# Patient Record
Sex: Male | Born: 1940 | Race: White | Hispanic: No | Smoking: Former smoker
Health system: Southern US, Community
[De-identification: ages and names within clinical notes are randomized; demographics above are authoritative.]

## PROBLEM LIST (undated history)

## (undated) DIAGNOSIS — D126 Benign neoplasm of colon, unspecified: Secondary | ICD-10-CM

## (undated) DIAGNOSIS — M545 Low back pain, unspecified: Secondary | ICD-10-CM

## (undated) DIAGNOSIS — Z87442 Personal history of urinary calculi: Secondary | ICD-10-CM

## (undated) DIAGNOSIS — I4891 Unspecified atrial fibrillation: Secondary | ICD-10-CM

## (undated) DIAGNOSIS — K5730 Diverticulosis of large intestine without perforation or abscess without bleeding: Secondary | ICD-10-CM

## (undated) DIAGNOSIS — Z720 Tobacco use: Secondary | ICD-10-CM

## (undated) DIAGNOSIS — G8929 Other chronic pain: Secondary | ICD-10-CM

## (undated) DIAGNOSIS — N138 Other obstructive and reflux uropathy: Secondary | ICD-10-CM

## (undated) DIAGNOSIS — I10 Essential (primary) hypertension: Secondary | ICD-10-CM

## (undated) DIAGNOSIS — I639 Cerebral infarction, unspecified: Secondary | ICD-10-CM

## (undated) DIAGNOSIS — I509 Heart failure, unspecified: Secondary | ICD-10-CM

## (undated) DIAGNOSIS — Z8719 Personal history of other diseases of the digestive system: Secondary | ICD-10-CM

## (undated) DIAGNOSIS — N410 Acute prostatitis: Secondary | ICD-10-CM

## (undated) DIAGNOSIS — K611 Rectal abscess: Secondary | ICD-10-CM

## (undated) DIAGNOSIS — R002 Palpitations: Secondary | ICD-10-CM

## (undated) DIAGNOSIS — R6882 Decreased libido: Secondary | ICD-10-CM

## (undated) DIAGNOSIS — N401 Enlarged prostate with lower urinary tract symptoms: Secondary | ICD-10-CM

## (undated) DIAGNOSIS — E785 Hyperlipidemia, unspecified: Secondary | ICD-10-CM

## (undated) DIAGNOSIS — J449 Chronic obstructive pulmonary disease, unspecified: Secondary | ICD-10-CM

## (undated) DIAGNOSIS — I2510 Atherosclerotic heart disease of native coronary artery without angina pectoris: Secondary | ICD-10-CM

## (undated) DIAGNOSIS — M1990 Unspecified osteoarthritis, unspecified site: Secondary | ICD-10-CM

## (undated) DIAGNOSIS — K219 Gastro-esophageal reflux disease without esophagitis: Secondary | ICD-10-CM

## (undated) DIAGNOSIS — C801 Malignant (primary) neoplasm, unspecified: Secondary | ICD-10-CM

## (undated) DIAGNOSIS — K222 Esophageal obstruction: Secondary | ICD-10-CM

## (undated) DIAGNOSIS — R5383 Other fatigue: Secondary | ICD-10-CM

## (undated) HISTORY — DX: Unspecified atrial fibrillation: I48.91

## (undated) HISTORY — PX: LUMBAR LAMINECTOMY: SHX95

## (undated) HISTORY — DX: Low back pain, unspecified: M54.50

## (undated) HISTORY — DX: Diverticulosis of large intestine without perforation or abscess without bleeding: K57.30

## (undated) HISTORY — DX: Low back pain: M54.5

## (undated) HISTORY — DX: Esophageal obstruction: K22.2

## (undated) HISTORY — DX: Personal history of other diseases of the digestive system: Z87.19

## (undated) HISTORY — DX: Rectal abscess: K61.1

## (undated) HISTORY — PX: HERNIA REPAIR: SHX51

## (undated) HISTORY — DX: Hyperlipidemia, unspecified: E78.5

## (undated) HISTORY — DX: Benign prostatic hyperplasia with lower urinary tract symptoms: N13.8

## (undated) HISTORY — DX: Chronic obstructive pulmonary disease, unspecified: J44.9

## (undated) HISTORY — DX: Other chronic pain: G89.29

## (undated) HISTORY — DX: Malignant (primary) neoplasm, unspecified: C80.1

## (undated) HISTORY — PX: PENILE PROSTHESIS PLACEMENT: SHX739

## (undated) HISTORY — DX: Other fatigue: R53.83

## (undated) HISTORY — DX: Palpitations: R00.2

## (undated) HISTORY — DX: Unspecified osteoarthritis, unspecified site: M19.90

## (undated) HISTORY — DX: Acute prostatitis: N41.0

## (undated) HISTORY — DX: Benign neoplasm of colon, unspecified: D12.6

## (undated) HISTORY — PX: TONSILLECTOMY: SHX5217

## (undated) HISTORY — DX: Atherosclerotic heart disease of native coronary artery without angina pectoris: I25.10

## (undated) HISTORY — DX: Tobacco use: Z72.0

## (undated) HISTORY — DX: Personal history of urinary calculi: Z87.442

## (undated) HISTORY — DX: Essential (primary) hypertension: I10

## (undated) HISTORY — DX: Benign prostatic hyperplasia with lower urinary tract symptoms: N40.1

## (undated) HISTORY — DX: Decreased libido: R68.82

## (undated) HISTORY — DX: Gastro-esophageal reflux disease without esophagitis: K21.9

---

## 1998-02-10 ENCOUNTER — Ambulatory Visit (HOSPITAL_COMMUNITY): Admission: RE | Admit: 1998-02-10 | Discharge: 1998-02-10 | Payer: Self-pay | Admitting: Gastroenterology

## 1998-03-15 ENCOUNTER — Emergency Department (HOSPITAL_COMMUNITY): Admission: EM | Admit: 1998-03-15 | Discharge: 1998-03-15 | Payer: Self-pay | Admitting: Emergency Medicine

## 1998-05-30 ENCOUNTER — Inpatient Hospital Stay (HOSPITAL_COMMUNITY): Admission: EM | Admit: 1998-05-30 | Discharge: 1998-06-01 | Payer: Self-pay | Admitting: Emergency Medicine

## 1998-05-30 ENCOUNTER — Encounter: Payer: Self-pay | Admitting: Emergency Medicine

## 1998-05-31 ENCOUNTER — Encounter: Payer: Self-pay | Admitting: Internal Medicine

## 1998-08-27 ENCOUNTER — Emergency Department (HOSPITAL_COMMUNITY): Admission: EM | Admit: 1998-08-27 | Discharge: 1998-08-27 | Payer: Self-pay | Admitting: Emergency Medicine

## 1998-08-27 ENCOUNTER — Encounter: Payer: Self-pay | Admitting: *Deleted

## 2000-08-24 ENCOUNTER — Emergency Department (HOSPITAL_COMMUNITY): Admission: EM | Admit: 2000-08-24 | Discharge: 2000-08-24 | Payer: Self-pay

## 2001-03-01 ENCOUNTER — Emergency Department (HOSPITAL_COMMUNITY): Admission: EM | Admit: 2001-03-01 | Discharge: 2001-03-01 | Payer: Self-pay | Admitting: Emergency Medicine

## 2001-03-02 ENCOUNTER — Emergency Department (HOSPITAL_COMMUNITY): Admission: EM | Admit: 2001-03-02 | Discharge: 2001-03-03 | Payer: Self-pay | Admitting: Emergency Medicine

## 2001-09-21 ENCOUNTER — Encounter: Payer: Self-pay | Admitting: Emergency Medicine

## 2001-09-21 ENCOUNTER — Emergency Department (HOSPITAL_COMMUNITY): Admission: EM | Admit: 2001-09-21 | Discharge: 2001-09-21 | Payer: Self-pay | Admitting: Emergency Medicine

## 2001-11-28 ENCOUNTER — Encounter: Payer: Self-pay | Admitting: Emergency Medicine

## 2001-11-28 ENCOUNTER — Emergency Department (HOSPITAL_COMMUNITY): Admission: EM | Admit: 2001-11-28 | Discharge: 2001-11-28 | Payer: Self-pay | Admitting: Emergency Medicine

## 2002-01-02 ENCOUNTER — Emergency Department (HOSPITAL_COMMUNITY): Admission: EM | Admit: 2002-01-02 | Discharge: 2002-01-02 | Payer: Self-pay | Admitting: Emergency Medicine

## 2002-01-04 ENCOUNTER — Ambulatory Visit (HOSPITAL_COMMUNITY): Admission: AD | Admit: 2002-01-04 | Discharge: 2002-01-04 | Payer: Self-pay

## 2002-10-05 ENCOUNTER — Emergency Department (HOSPITAL_COMMUNITY): Admission: EM | Admit: 2002-10-05 | Discharge: 2002-10-05 | Payer: Self-pay | Admitting: Emergency Medicine

## 2002-10-05 ENCOUNTER — Encounter: Payer: Self-pay | Admitting: Emergency Medicine

## 2003-06-11 ENCOUNTER — Ambulatory Visit (HOSPITAL_COMMUNITY): Admission: RE | Admit: 2003-06-11 | Discharge: 2003-06-11 | Payer: Self-pay | Admitting: *Deleted

## 2003-07-09 ENCOUNTER — Ambulatory Visit (HOSPITAL_COMMUNITY): Admission: RE | Admit: 2003-07-09 | Discharge: 2003-07-09 | Payer: Self-pay | Admitting: *Deleted

## 2003-08-23 ENCOUNTER — Emergency Department (HOSPITAL_COMMUNITY): Admission: EM | Admit: 2003-08-23 | Discharge: 2003-08-24 | Payer: Self-pay | Admitting: Emergency Medicine

## 2003-11-09 ENCOUNTER — Emergency Department (HOSPITAL_COMMUNITY): Admission: AD | Admit: 2003-11-09 | Discharge: 2003-11-09 | Payer: Self-pay | Admitting: *Deleted

## 2004-04-12 ENCOUNTER — Encounter: Admission: RE | Admit: 2004-04-12 | Discharge: 2004-04-12 | Payer: Self-pay | Admitting: Neurology

## 2004-10-28 ENCOUNTER — Emergency Department (HOSPITAL_COMMUNITY): Admission: EM | Admit: 2004-10-28 | Discharge: 2004-10-28 | Payer: Self-pay | Admitting: Emergency Medicine

## 2004-10-29 ENCOUNTER — Ambulatory Visit (HOSPITAL_COMMUNITY): Admission: RE | Admit: 2004-10-29 | Discharge: 2004-10-29 | Payer: Self-pay | Admitting: Emergency Medicine

## 2005-10-07 ENCOUNTER — Encounter (INDEPENDENT_AMBULATORY_CARE_PROVIDER_SITE_OTHER): Payer: Self-pay | Admitting: *Deleted

## 2005-10-07 ENCOUNTER — Ambulatory Visit (HOSPITAL_COMMUNITY): Admission: RE | Admit: 2005-10-07 | Discharge: 2005-10-07 | Payer: Self-pay | Admitting: *Deleted

## 2005-10-08 ENCOUNTER — Ambulatory Visit: Payer: Self-pay | Admitting: Cardiovascular Disease

## 2005-10-08 ENCOUNTER — Inpatient Hospital Stay (HOSPITAL_COMMUNITY): Admission: EM | Admit: 2005-10-08 | Discharge: 2005-10-09 | Payer: Self-pay | Admitting: Emergency Medicine

## 2005-10-20 ENCOUNTER — Ambulatory Visit: Payer: Self-pay

## 2005-10-20 ENCOUNTER — Encounter: Payer: Self-pay | Admitting: Internal Medicine

## 2005-10-24 ENCOUNTER — Ambulatory Visit: Payer: Self-pay | Admitting: Cardiovascular Disease

## 2005-10-26 ENCOUNTER — Emergency Department (HOSPITAL_COMMUNITY): Admission: EM | Admit: 2005-10-26 | Discharge: 2005-10-26 | Payer: Self-pay | Admitting: Emergency Medicine

## 2005-12-26 ENCOUNTER — Ambulatory Visit (HOSPITAL_COMMUNITY): Admission: RE | Admit: 2005-12-26 | Discharge: 2005-12-26 | Payer: Self-pay | Admitting: *Deleted

## 2006-01-04 ENCOUNTER — Encounter: Payer: Self-pay | Admitting: Internal Medicine

## 2006-02-15 ENCOUNTER — Encounter: Admission: RE | Admit: 2006-02-15 | Discharge: 2006-02-15 | Payer: Self-pay | Admitting: *Deleted

## 2006-03-11 ENCOUNTER — Emergency Department (HOSPITAL_COMMUNITY): Admission: EM | Admit: 2006-03-11 | Discharge: 2006-03-11 | Payer: Self-pay | Admitting: Emergency Medicine

## 2006-03-27 ENCOUNTER — Emergency Department (HOSPITAL_COMMUNITY): Admission: EM | Admit: 2006-03-27 | Discharge: 2006-03-27 | Payer: Self-pay | Admitting: Emergency Medicine

## 2006-04-07 ENCOUNTER — Ambulatory Visit: Payer: Self-pay | Admitting: Cardiovascular Disease

## 2006-04-27 ENCOUNTER — Ambulatory Visit: Payer: Self-pay | Admitting: Internal Medicine

## 2006-06-01 ENCOUNTER — Ambulatory Visit: Payer: Self-pay | Admitting: Internal Medicine

## 2006-06-27 ENCOUNTER — Ambulatory Visit: Payer: Self-pay | Admitting: Pulmonary Disease

## 2006-07-21 ENCOUNTER — Ambulatory Visit: Payer: Self-pay | Admitting: Internal Medicine

## 2006-10-09 ENCOUNTER — Emergency Department (HOSPITAL_COMMUNITY): Admission: EM | Admit: 2006-10-09 | Discharge: 2006-10-09 | Payer: Self-pay | Admitting: Emergency Medicine

## 2006-10-11 ENCOUNTER — Ambulatory Visit: Payer: Self-pay | Admitting: Cardiovascular Disease

## 2006-11-03 ENCOUNTER — Ambulatory Visit: Payer: Self-pay | Admitting: Internal Medicine

## 2006-11-03 LAB — CONVERTED CEMR LAB: PSA: 1.08 ng/mL (ref 0.10–4.00)

## 2006-11-17 ENCOUNTER — Ambulatory Visit: Payer: Self-pay | Admitting: Internal Medicine

## 2006-12-12 ENCOUNTER — Ambulatory Visit: Payer: Self-pay | Admitting: Gastroenterology

## 2006-12-25 ENCOUNTER — Encounter (INDEPENDENT_AMBULATORY_CARE_PROVIDER_SITE_OTHER): Payer: Self-pay | Admitting: Specialist

## 2006-12-25 ENCOUNTER — Ambulatory Visit: Payer: Self-pay | Admitting: Gastroenterology

## 2007-03-13 ENCOUNTER — Ambulatory Visit: Payer: Self-pay | Admitting: Internal Medicine

## 2007-05-08 ENCOUNTER — Encounter: Payer: Self-pay | Admitting: *Deleted

## 2007-05-08 DIAGNOSIS — F17200 Nicotine dependence, unspecified, uncomplicated: Secondary | ICD-10-CM

## 2007-05-08 DIAGNOSIS — Z872 Personal history of diseases of the skin and subcutaneous tissue: Secondary | ICD-10-CM | POA: Insufficient documentation

## 2007-05-08 DIAGNOSIS — Z8739 Personal history of other diseases of the musculoskeletal system and connective tissue: Secondary | ICD-10-CM

## 2007-05-08 DIAGNOSIS — I10 Essential (primary) hypertension: Secondary | ICD-10-CM | POA: Insufficient documentation

## 2007-05-08 DIAGNOSIS — I2510 Atherosclerotic heart disease of native coronary artery without angina pectoris: Secondary | ICD-10-CM

## 2007-05-08 DIAGNOSIS — Z9689 Presence of other specified functional implants: Secondary | ICD-10-CM

## 2007-05-08 DIAGNOSIS — Z9889 Other specified postprocedural states: Secondary | ICD-10-CM

## 2007-05-08 DIAGNOSIS — J45909 Unspecified asthma, uncomplicated: Secondary | ICD-10-CM | POA: Insufficient documentation

## 2007-05-08 DIAGNOSIS — K219 Gastro-esophageal reflux disease without esophagitis: Secondary | ICD-10-CM | POA: Insufficient documentation

## 2007-05-08 DIAGNOSIS — Z87442 Personal history of urinary calculi: Secondary | ICD-10-CM

## 2007-05-14 ENCOUNTER — Ambulatory Visit: Payer: Self-pay | Admitting: Gastroenterology

## 2007-05-16 ENCOUNTER — Ambulatory Visit (HOSPITAL_COMMUNITY): Admission: RE | Admit: 2007-05-16 | Discharge: 2007-05-16 | Payer: Self-pay | Admitting: Gastroenterology

## 2007-05-22 ENCOUNTER — Encounter: Payer: Self-pay | Admitting: Gastroenterology

## 2007-05-22 ENCOUNTER — Ambulatory Visit: Payer: Self-pay | Admitting: Gastroenterology

## 2007-05-22 DIAGNOSIS — D126 Benign neoplasm of colon, unspecified: Secondary | ICD-10-CM

## 2007-05-22 DIAGNOSIS — K222 Esophageal obstruction: Secondary | ICD-10-CM | POA: Insufficient documentation

## 2007-05-22 DIAGNOSIS — K5730 Diverticulosis of large intestine without perforation or abscess without bleeding: Secondary | ICD-10-CM | POA: Insufficient documentation

## 2007-05-22 LAB — HM COLONOSCOPY: HM Colonoscopy: ABNORMAL

## 2007-08-20 ENCOUNTER — Ambulatory Visit: Payer: Self-pay | Admitting: Internal Medicine

## 2007-08-20 ENCOUNTER — Encounter: Payer: Self-pay | Admitting: Internal Medicine

## 2007-08-20 ENCOUNTER — Inpatient Hospital Stay (HOSPITAL_COMMUNITY): Admission: EM | Admit: 2007-08-20 | Discharge: 2007-08-24 | Payer: Self-pay | Admitting: Emergency Medicine

## 2007-08-24 ENCOUNTER — Ambulatory Visit: Payer: Self-pay | Admitting: Gastroenterology

## 2007-09-25 ENCOUNTER — Ambulatory Visit: Payer: Self-pay | Admitting: Gastroenterology

## 2007-10-02 ENCOUNTER — Ambulatory Visit: Payer: Self-pay | Admitting: Internal Medicine

## 2007-10-02 DIAGNOSIS — J449 Chronic obstructive pulmonary disease, unspecified: Secondary | ICD-10-CM

## 2007-10-02 DIAGNOSIS — M545 Low back pain: Secondary | ICD-10-CM

## 2007-10-02 DIAGNOSIS — J4489 Other specified chronic obstructive pulmonary disease: Secondary | ICD-10-CM | POA: Insufficient documentation

## 2007-10-03 ENCOUNTER — Ambulatory Visit: Payer: Self-pay | Admitting: Cardiovascular Disease

## 2007-10-04 ENCOUNTER — Encounter: Payer: Self-pay | Admitting: Internal Medicine

## 2007-10-15 ENCOUNTER — Encounter: Admission: RE | Admit: 2007-10-15 | Discharge: 2007-10-15 | Payer: Self-pay | Admitting: Orthopedic Surgery

## 2007-10-24 ENCOUNTER — Ambulatory Visit: Payer: Self-pay

## 2007-10-24 ENCOUNTER — Encounter: Payer: Self-pay | Admitting: Internal Medicine

## 2007-11-07 ENCOUNTER — Ambulatory Visit: Payer: Self-pay | Admitting: Internal Medicine

## 2007-11-09 LAB — CONVERTED CEMR LAB
CO2: 33 meq/L — ABNORMAL HIGH (ref 19–32)
HDL: 28.1 mg/dL — ABNORMAL LOW (ref >39.0)
PSA: 1.06 ng/mL (ref 0.10–4.00)
TSH: 2.39 microintl units/mL (ref 0.35–5.50)
Total CHOL/HDL Ratio: 8.7
Triglycerides: 151 mg/dL — ABNORMAL HIGH (ref 0–149)

## 2007-11-13 ENCOUNTER — Ambulatory Visit: Payer: Self-pay | Admitting: Internal Medicine

## 2007-11-13 DIAGNOSIS — E785 Hyperlipidemia, unspecified: Secondary | ICD-10-CM

## 2007-11-28 DIAGNOSIS — Z8719 Personal history of other diseases of the digestive system: Secondary | ICD-10-CM

## 2007-12-07 ENCOUNTER — Ambulatory Visit: Payer: Self-pay | Admitting: Internal Medicine

## 2008-01-11 ENCOUNTER — Ambulatory Visit: Payer: Self-pay | Admitting: Internal Medicine

## 2008-01-11 LAB — CONVERTED CEMR LAB
ALT: 15 units/L (ref 0–53)
AST: 16 units/L (ref 0–37)
Direct LDL: 164.9 mg/dL
HDL: 28.1 mg/dL — ABNORMAL LOW (ref >39.0)
Total CHOL/HDL Ratio: 7.8
VLDL: 26 mg/dL (ref 0–40)

## 2008-01-15 ENCOUNTER — Ambulatory Visit: Payer: Self-pay | Admitting: Internal Medicine

## 2008-01-16 ENCOUNTER — Telehealth: Payer: Self-pay | Admitting: Internal Medicine

## 2008-04-01 ENCOUNTER — Ambulatory Visit: Payer: Self-pay | Admitting: Internal Medicine

## 2008-06-18 ENCOUNTER — Emergency Department (HOSPITAL_COMMUNITY): Admission: EM | Admit: 2008-06-18 | Discharge: 2008-06-18 | Payer: Self-pay | Admitting: Emergency Medicine

## 2008-09-11 ENCOUNTER — Ambulatory Visit: Payer: Self-pay | Admitting: Internal Medicine

## 2008-09-12 ENCOUNTER — Ambulatory Visit: Payer: Self-pay | Admitting: Internal Medicine

## 2008-09-16 ENCOUNTER — Ambulatory Visit: Payer: Self-pay | Admitting: Internal Medicine

## 2008-09-16 ENCOUNTER — Inpatient Hospital Stay (HOSPITAL_COMMUNITY): Admission: EM | Admit: 2008-09-16 | Discharge: 2008-09-19 | Payer: Self-pay | Admitting: Emergency Medicine

## 2008-09-17 ENCOUNTER — Encounter: Payer: Self-pay | Admitting: Internal Medicine

## 2008-10-24 ENCOUNTER — Telehealth: Payer: Self-pay | Admitting: Internal Medicine

## 2009-03-25 ENCOUNTER — Ambulatory Visit: Payer: Self-pay | Admitting: Internal Medicine

## 2009-03-25 DIAGNOSIS — N410 Acute prostatitis: Secondary | ICD-10-CM

## 2009-03-25 DIAGNOSIS — N401 Enlarged prostate with lower urinary tract symptoms: Secondary | ICD-10-CM

## 2009-03-25 LAB — CONVERTED CEMR LAB
Albumin: 4 g/dL (ref 3.5–5.2)
Alkaline Phosphatase: 81 units/L (ref 39–117)
BUN: 12 mg/dL (ref 6–23)
Basophils Absolute: 0.3 10*3/uL — ABNORMAL HIGH (ref 0.0–0.1)
Bilirubin Urine: NEGATIVE
CO2: 32 meq/L (ref 19–32)
Calcium: 9.2 mg/dL (ref 8.4–10.5)
Eosinophils Absolute: 0.1 10*3/uL (ref 0.0–0.7)
Glucose, Bld: 101 mg/dL — ABNORMAL HIGH (ref 70–99)
Hemoglobin: 15.9 g/dL (ref 13.0–17.0)
Lymphocytes Relative: 22.5 % (ref 12.0–46.0)
Lymphs Abs: 1.6 10*3/uL (ref 0.7–4.0)
MCHC: 34.1 g/dL (ref 30.0–36.0)
Neutro Abs: 4.5 10*3/uL (ref 1.4–7.7)
Nitrite: NEGATIVE
Platelets: 176 10*3/uL (ref 150.0–400.0)
RDW: 14.9 % — ABNORMAL HIGH (ref 11.5–14.6)
Sodium: 144 meq/L (ref 135–145)
TSH: 2.06 microintl units/mL (ref 0.35–5.50)
Total Bilirubin: 1 mg/dL (ref 0.3–1.2)
Urobilinogen, UA: 0.2

## 2009-03-26 ENCOUNTER — Encounter: Payer: Self-pay | Admitting: Internal Medicine

## 2009-03-26 LAB — CONVERTED CEMR LAB
Chlamydia, Swab/Urine, PCR: NEGATIVE
GC Probe Amp, Urine: NEGATIVE

## 2009-03-31 ENCOUNTER — Ambulatory Visit: Payer: Self-pay | Admitting: Internal Medicine

## 2009-03-31 ENCOUNTER — Ambulatory Visit (HOSPITAL_BASED_OUTPATIENT_CLINIC_OR_DEPARTMENT_OTHER): Admission: RE | Admit: 2009-03-31 | Discharge: 2009-03-31 | Payer: Self-pay | Admitting: Internal Medicine

## 2009-03-31 ENCOUNTER — Ambulatory Visit: Payer: Self-pay | Admitting: Diagnostic Radiology

## 2009-03-31 DIAGNOSIS — R6882 Decreased libido: Secondary | ICD-10-CM | POA: Insufficient documentation

## 2009-03-31 DIAGNOSIS — R5383 Other fatigue: Secondary | ICD-10-CM

## 2009-03-31 DIAGNOSIS — R5381 Other malaise: Secondary | ICD-10-CM

## 2009-03-31 LAB — CONVERTED CEMR LAB
Blood in Urine, dipstick: NEGATIVE
Protein, U semiquant: 30
Urobilinogen, UA: 0.2
WBC Urine, dipstick: NEGATIVE

## 2009-04-08 DIAGNOSIS — R002 Palpitations: Secondary | ICD-10-CM | POA: Insufficient documentation

## 2009-04-08 DIAGNOSIS — I4891 Unspecified atrial fibrillation: Secondary | ICD-10-CM | POA: Insufficient documentation

## 2009-04-08 DIAGNOSIS — R109 Unspecified abdominal pain: Secondary | ICD-10-CM | POA: Insufficient documentation

## 2009-04-13 ENCOUNTER — Telehealth: Payer: Self-pay | Admitting: Internal Medicine

## 2009-04-17 ENCOUNTER — Encounter (INDEPENDENT_AMBULATORY_CARE_PROVIDER_SITE_OTHER): Payer: Self-pay | Admitting: *Deleted

## 2009-04-19 ENCOUNTER — Emergency Department (HOSPITAL_COMMUNITY): Admission: EM | Admit: 2009-04-19 | Discharge: 2009-04-19 | Payer: Self-pay | Admitting: Emergency Medicine

## 2009-04-21 ENCOUNTER — Ambulatory Visit: Payer: Self-pay | Admitting: Internal Medicine

## 2009-04-21 DIAGNOSIS — J209 Acute bronchitis, unspecified: Secondary | ICD-10-CM

## 2009-04-21 DIAGNOSIS — J040 Acute laryngitis: Secondary | ICD-10-CM | POA: Insufficient documentation

## 2009-04-24 ENCOUNTER — Encounter: Payer: Self-pay | Admitting: Internal Medicine

## 2009-04-27 ENCOUNTER — Telehealth: Payer: Self-pay | Admitting: Internal Medicine

## 2009-05-05 ENCOUNTER — Ambulatory Visit: Payer: Self-pay | Admitting: Internal Medicine

## 2009-05-05 DIAGNOSIS — N508 Other specified disorders of male genital organs: Secondary | ICD-10-CM

## 2009-05-08 ENCOUNTER — Encounter: Payer: Self-pay | Admitting: Internal Medicine

## 2009-06-01 ENCOUNTER — Ambulatory Visit: Payer: Self-pay | Admitting: Internal Medicine

## 2009-07-07 ENCOUNTER — Encounter: Payer: Self-pay | Admitting: Internal Medicine

## 2009-09-30 ENCOUNTER — Encounter: Payer: Self-pay | Admitting: Internal Medicine

## 2009-10-07 ENCOUNTER — Encounter: Payer: Self-pay | Admitting: Internal Medicine

## 2009-12-22 ENCOUNTER — Ambulatory Visit: Payer: Self-pay | Admitting: Internal Medicine

## 2009-12-29 ENCOUNTER — Ambulatory Visit: Payer: Self-pay | Admitting: Internal Medicine

## 2009-12-29 DIAGNOSIS — N4889 Other specified disorders of penis: Secondary | ICD-10-CM

## 2010-01-26 ENCOUNTER — Ambulatory Visit: Payer: Self-pay | Admitting: Internal Medicine

## 2010-01-26 DIAGNOSIS — M7020 Olecranon bursitis, unspecified elbow: Secondary | ICD-10-CM

## 2010-02-07 ENCOUNTER — Inpatient Hospital Stay (HOSPITAL_COMMUNITY): Admission: EM | Admit: 2010-02-07 | Discharge: 2010-02-09 | Payer: Self-pay | Admitting: Emergency Medicine

## 2010-02-11 ENCOUNTER — Ambulatory Visit: Payer: Self-pay | Admitting: Internal Medicine

## 2010-02-11 DIAGNOSIS — J441 Chronic obstructive pulmonary disease with (acute) exacerbation: Secondary | ICD-10-CM

## 2010-02-11 DIAGNOSIS — E119 Type 2 diabetes mellitus without complications: Secondary | ICD-10-CM | POA: Insufficient documentation

## 2010-02-11 LAB — CONVERTED CEMR LAB
BUN: 21 mg/dL (ref 6–23)
Calcium: 9.2 mg/dL (ref 8.4–10.5)
Creatinine, Ser: 0.93 mg/dL (ref 0.40–1.50)
Glucose, Bld: 132 mg/dL — ABNORMAL HIGH (ref 70–99)
Hgb A1c MFr Bld: 6.6 % — ABNORMAL HIGH (ref <5.7)

## 2010-02-12 ENCOUNTER — Telehealth: Payer: Self-pay | Admitting: Internal Medicine

## 2010-02-16 ENCOUNTER — Encounter: Payer: Self-pay | Admitting: Internal Medicine

## 2010-02-25 ENCOUNTER — Telehealth: Payer: Self-pay | Admitting: Internal Medicine

## 2010-03-15 ENCOUNTER — Telehealth: Payer: Self-pay | Admitting: Internal Medicine

## 2010-03-17 ENCOUNTER — Encounter: Payer: Self-pay | Admitting: Internal Medicine

## 2010-03-22 ENCOUNTER — Encounter: Admission: RE | Admit: 2010-03-22 | Discharge: 2010-06-03 | Payer: Self-pay | Admitting: Internal Medicine

## 2010-03-22 ENCOUNTER — Encounter: Payer: Self-pay | Admitting: Internal Medicine

## 2010-03-23 ENCOUNTER — Ambulatory Visit: Payer: Self-pay | Admitting: Internal Medicine

## 2010-03-25 ENCOUNTER — Encounter: Payer: Self-pay | Admitting: Internal Medicine

## 2010-03-26 ENCOUNTER — Encounter: Payer: Self-pay | Admitting: Internal Medicine

## 2010-04-02 ENCOUNTER — Telehealth: Payer: Self-pay | Admitting: Internal Medicine

## 2010-04-05 HISTORY — PX: CATARACT EXTRACTION: SUR2

## 2010-04-09 ENCOUNTER — Encounter: Payer: Self-pay | Admitting: Internal Medicine

## 2010-04-09 LAB — CONVERTED CEMR LAB
AST: 11 units/L (ref 0–37)
Albumin: 4.1 g/dL (ref 3.5–5.2)
Alkaline Phosphatase: 66 units/L (ref 39–117)
CO2: 31 meq/L (ref 19–32)
Calcium: 9.2 mg/dL (ref 8.4–10.5)
Cholesterol: 128 mg/dL (ref 0–200)
Creatinine, Ser: 0.96 mg/dL (ref 0.40–1.50)
HDL: 34 mg/dL — ABNORMAL LOW (ref >39)
Total Bilirubin: 0.6 mg/dL (ref 0.3–1.2)
Triglycerides: 100 mg/dL (ref <150)

## 2010-04-12 ENCOUNTER — Encounter: Payer: Self-pay | Admitting: Internal Medicine

## 2010-04-13 ENCOUNTER — Telehealth: Payer: Self-pay | Admitting: Internal Medicine

## 2010-04-13 ENCOUNTER — Encounter: Payer: Self-pay | Admitting: Internal Medicine

## 2010-04-14 ENCOUNTER — Telehealth: Payer: Self-pay | Admitting: Internal Medicine

## 2010-04-20 ENCOUNTER — Ambulatory Visit (HOSPITAL_COMMUNITY)
Admission: RE | Admit: 2010-04-20 | Discharge: 2010-04-21 | Payer: Self-pay | Source: Home / Self Care | Admitting: Ophthalmology

## 2010-05-17 ENCOUNTER — Ambulatory Visit: Payer: Self-pay | Admitting: Internal Medicine

## 2010-05-17 LAB — CONVERTED CEMR LAB
CO2: 24 meq/L (ref 19–32)
Calcium: 9.5 mg/dL (ref 8.4–10.5)
Chloride: 106 meq/L (ref 96–112)
Hgb A1c MFr Bld: 6.1 % — ABNORMAL HIGH (ref <5.7)
Microalb Creat Ratio: 8.7 mg/g (ref 0.0–30.0)

## 2010-05-18 ENCOUNTER — Encounter: Payer: Self-pay | Admitting: Internal Medicine

## 2010-07-27 ENCOUNTER — Encounter: Payer: Self-pay | Admitting: Internal Medicine

## 2010-09-13 ENCOUNTER — Ambulatory Visit
Admission: RE | Admit: 2010-09-13 | Discharge: 2010-09-13 | Payer: Self-pay | Source: Home / Self Care | Attending: Internal Medicine | Admitting: Internal Medicine

## 2010-09-13 DIAGNOSIS — R252 Cramp and spasm: Secondary | ICD-10-CM | POA: Insufficient documentation

## 2010-09-13 LAB — CONVERTED CEMR LAB
CO2: 31 meq/L (ref 19–32)
Chloride: 103 meq/L (ref 96–112)
Creatinine, Ser: 0.94 mg/dL (ref 0.40–1.50)
Hgb A1c MFr Bld: 6.3 % — ABNORMAL HIGH (ref <5.7)
Potassium: 5 meq/L (ref 3.5–5.3)
Sodium: 143 meq/L (ref 135–145)
Total CK: 39 units/L (ref 7–232)
Vit D, 1,25-Dihydroxy: 33 (ref 30–89)

## 2010-09-14 ENCOUNTER — Emergency Department (HOSPITAL_COMMUNITY)
Admission: EM | Admit: 2010-09-14 | Discharge: 2010-09-14 | Payer: Self-pay | Source: Home / Self Care | Admitting: Emergency Medicine

## 2010-09-14 ENCOUNTER — Encounter: Payer: Self-pay | Admitting: Internal Medicine

## 2010-09-20 LAB — CBC
HCT: 45.6 % (ref 39.0–52.0)
Hemoglobin: 15.2 g/dL (ref 13.0–17.0)
MCH: 28.1 pg (ref 26.0–34.0)
MCHC: 33.3 g/dL (ref 30.0–36.0)
MCV: 84.4 fL (ref 78.0–100.0)
Platelets: 164 10*3/uL (ref 150–400)
RBC: 5.4 MIL/uL (ref 4.22–5.81)
RDW: 15.2 % (ref 11.5–15.5)
WBC: 10.2 10*3/uL (ref 4.0–10.5)

## 2010-09-20 LAB — URINE MICROSCOPIC-ADD ON

## 2010-09-20 LAB — BASIC METABOLIC PANEL
BUN: 10 mg/dL (ref 6–23)
CO2: 28 mEq/L (ref 19–32)
Calcium: 9.3 mg/dL (ref 8.4–10.5)
Chloride: 107 mEq/L (ref 96–112)
Creatinine, Ser: 1.27 mg/dL (ref 0.4–1.5)
GFR calc Af Amer: >60 mL/min (ref >60)
GFR calc non Af Amer: 56 mL/min — ABNORMAL LOW (ref >60)
Glucose, Bld: 132 mg/dL — ABNORMAL HIGH (ref 70–99)
Potassium: 5 mEq/L (ref 3.5–5.1)
Sodium: 142 mEq/L (ref 135–145)

## 2010-09-20 LAB — URINALYSIS, ROUTINE W REFLEX MICROSCOPIC
Bilirubin Urine: NEGATIVE
Ketones, ur: NEGATIVE mg/dL
Nitrite: NEGATIVE
Protein, ur: NEGATIVE mg/dL
Specific Gravity, Urine: 1.02 (ref 1.005–1.030)
Urine Glucose, Fasting: NEGATIVE mg/dL
Urobilinogen, UA: 1 mg/dL (ref 0.0–1.0)
pH: 8 (ref 5.0–8.0)

## 2010-09-20 LAB — DIFFERENTIAL
Basophils Absolute: 0 10*3/uL (ref 0.0–0.1)
Basophils Relative: 0 % (ref 0–1)
Eosinophils Absolute: 0 10*3/uL (ref 0.0–0.7)
Eosinophils Relative: 0 % (ref 0–5)
Lymphocytes Relative: 9 % — ABNORMAL LOW (ref 12–46)
Lymphs Abs: 1 10*3/uL (ref 0.7–4.0)
Monocytes Absolute: 0.5 10*3/uL (ref 0.1–1.0)
Monocytes Relative: 5 % (ref 3–12)
Neutro Abs: 8.8 10*3/uL — ABNORMAL HIGH (ref 1.7–7.7)
Neutrophils Relative %: 86 % — ABNORMAL HIGH (ref 43–77)

## 2010-09-20 LAB — ETHANOL: Alcohol, Ethyl (B): <5 mg/dL (ref 0–10)

## 2010-09-23 ENCOUNTER — Encounter: Payer: Self-pay | Admitting: Internal Medicine

## 2010-09-23 DIAGNOSIS — I739 Peripheral vascular disease, unspecified: Secondary | ICD-10-CM | POA: Insufficient documentation

## 2010-09-24 ENCOUNTER — Encounter: Payer: Self-pay | Admitting: Internal Medicine

## 2010-09-24 ENCOUNTER — Ambulatory Visit: Admission: RE | Admit: 2010-09-24 | Discharge: 2010-09-24 | Payer: Self-pay | Source: Home / Self Care

## 2010-09-26 ENCOUNTER — Encounter: Payer: Self-pay | Admitting: Orthopedic Surgery

## 2010-09-27 ENCOUNTER — Encounter: Payer: Self-pay | Admitting: Gastroenterology

## 2010-09-28 ENCOUNTER — Ambulatory Visit
Admission: RE | Admit: 2010-09-28 | Discharge: 2010-09-28 | Payer: Self-pay | Source: Home / Self Care | Attending: Vascular Surgery | Admitting: Vascular Surgery

## 2010-09-28 ENCOUNTER — Telehealth: Payer: Self-pay | Admitting: Internal Medicine

## 2010-09-28 ENCOUNTER — Encounter: Payer: Self-pay | Admitting: Internal Medicine

## 2010-09-29 NOTE — Consult Note (Signed)
NEW PATIENT CONSULTATION  Andrew [name], Andrew [name] DOB:  02/17/1941                                       09/28/2010 EAVWU#:[id number]  The patient presents today for follow-up of the recent CT scan that showed a 4.6 cm infrarenal abdominal aortic aneurysm.  He is an active healthy 70 year old gentleman who underwent CT scan at [facility] on 09/14/2010 due to renal stone.  The CT scan did show renal stone.  He subsequently passed this and has had complete resolution of his pain.  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 In comparing this to his old, most recent CT scan available from 09/2008 his maximal diameter that time was 3.6 cm.  He has no symptoms referable to this.  He does not have any family history of aneurysms.  He does have history of diabetes which is not non-insulin dependent, history of hypertension, history of COPD and emphysema.  PAST SURGICAL HISTORY:  Lumbar fusion, right inguinal herniorrhaphy, tonsillectomy and vasectomy.  SOCIAL HISTORY:  He is single with 7 children.  He is retired.  He does smoke one pack of cigarettes per day.  Does not drink alcohol on a regular basis.  FAMILY HISTORY:  Otherwise negative.  REVIEW OF SYSTEMS:  GENERAL:  No weight loss or gain, he weighs 165 pounds.  He is 5 feet 6 inches tall. VASCULAR:  Positive for no claudication.  He does report night cramping. CARDIAC:  Negative. GI:  Positive for reflux, hiatal hernia. NEUROLOGIC:  Negative. PULMONARY:  Positive for asthma, wheezing. HEMATOLOGY, URINARY:  Negative. ENT:  Positive for change in eyesight. MUSCULOSKELETAL:  Positive for arthritis. PSYCHIATRIC:  Negative. SKIN: Negative.  PHYSICAL EXAMINATION:  A well-developed, well-nourished white male appearing stated age of 70 in no acute distress.  Blood pressure is 141/73, pulse 62, respirations 18. His oxygen saturation  is 96% in room air.  HEENT:  Normal.  Chest:  Clear bilaterally without rales, rhonchi or wheezes.  Heart:  Regular rate and rhythm.  I do not appreciate any carotid bruits.  He has 2+ radial, 2+ femoral, 2+ popliteal and 2+ posterior tibial pulses without evidence of peripheral aneurysm. Abdomen:  Soft, nontender.  He does have a prominent aortic pulsation. I do not feel any specific aneurysm.  He does not have any other masses. Musculoskeletal:  Shows no major deformities or cyanosis.  Neurologic: No focal weakness or paresthesias.  Skin:  Without ulcers or rashes.  I have independently reviewed his CT scan and discussed this at length with the patient.  This does show an infrarenal aneurysm extending into his iliac arteries bilaterally.  Maximal size of the right common iliac artery is 2.2 and the left is 1.7.  The maximal diameter of his infrarenal aorta is 4.6.  I explained the options for open surgical treatment and stent graft repair and explained that currently he would be a candidate for stent graft repair.  I explained with his small to moderate sized aneurysm, we would recommend serial 48-month follow-up of this to rule out any change.  He understands this and will see [place] again in 6 months with a CT scan.  I did explain symptoms of leaking aneurysm to him and he will present immediately to [facility] emergency room should this occur.  Otherwise he  will be seen again in 6 months.    Larina Earthly, M.[name]. Electronically Signed  TFE/MEDQ  [name]:  09/28/2010  T:  09/29/2010  Job:  [id number]  cc:   Barbette Hair. Artist Pais, DO

## 2010-10-07 ENCOUNTER — Encounter: Payer: Self-pay | Admitting: Internal Medicine

## 2010-10-07 NOTE — Letter (Signed)
Summary: [facility]  [facility]   Imported By: Lanelle Bal 10/15/2009 11:50:32  _____________________________________________________________________  External Attachment:    Type:   Image     Comment:   External Document

## 2010-10-07 NOTE — Letter (Signed)
Summary: CMN for Diabetes Supplies/[facility]  CMN for Diabetes Supplies/[facility]   Imported By: Lanelle Bal 04/23/2010 08:53:13  _____________________________________________________________________  External Attachment:    Type:   Image     Comment:   External Document

## 2010-10-07 NOTE — Progress Notes (Signed)
Summary: Diabete supplies  Phone Note Call from Patient   Caller: Patient Details for Reason: Diabete supplies Summary of Call: Pt called to let Dr Artist Pais know that he will be getting his diabete supplies from   [facility] .      Initial call taken by: Darral Dash,  March 15, 2010 12:46 PM

## 2010-10-07 NOTE — Assessment & Plan Note (Signed)
Summary: 1 month follow up/mhf rsc with pt/mhf   Vital Signs:  Patient profile:   70 year old Andrew [name] Weight:      168 pounds BMI:     28.06 O2 Sat:      96 % on Room air Temp:     98.1 degrees F oral Pulse rate:   74 / minute Pulse rhythm:   regular Resp:     18 per minute BP sitting:   140 / 68  (right arm) Cuff size:   regular  Vitals Entered By: Glendell Docker CMA (March 23, 2010 2:11 PM)  O2 Flow:  Room air CC: Rm 3- Follow up disease management, Type 2 diabetes mellitus follow-up Is Patient Diabetic? Yes Did you bring your meter with you today? No Pain Assessment Patient in pain? no      Comments blood sugar is running from 94-126, lowest has been 66 once, patient was seen by nutritionist and was advised to sign up for classes and feels that he does not need the classes.   Primary Care Provider:  Dondra Spry DO  CC:  Rm 3- Follow up disease management and Type 2 diabetes mellitus follow-up.  History of Present Illness:  Type 2 Diabetes Mellitus Follow-Up      This is a 70 year old man who presents for Type 2 diabetes mellitus follow-up.  The patient denies weight gain.  The patient denies the following symptoms: chest pain.  Since the last visit the patient reports good dietary compliance, exercising regularly, and monitoring blood glucose.    Preventive Screening-Counseling & Management  Alcohol-Tobacco     Smoking Status: current  Allergies: 1)  ! * Procaine 2)  ! 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) DIVERTICULOSIS, COLON (ICD-562.10) ESOPHAGEAL STRICTURE (ICD-530.3)  SMALL BOWEL  OBSTRUCTION, HX OF (ICD-V12.79) HYPERLIPIDEMIA (ICD-272.4) COPD (ICD-496)with ongoing tobacco abuse FAMILY HISTORY OF CAD Andrew [name] 1ST DEGREE RELATIVE <60 (ICD-V16.Andrew) LOW BACK PAIN, CHRONIC (ICD-724.2) LAMINECTOMY, LUMBAR, HX OF (ICD-V45.89) ABSCESS, PERIRECTAL, HX OF (ICD-V13.3) 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    back surgery      hernia repair     tonsillectomy    Family History: Family History of CAD Andrew [name] 1st degree relative <60 Father deceased age 63 - ? medication reaction  Brother has diabetes, CAD, emphysema/COPD         Social History: Retired Divorced  Current Smoker   Alcohol use-no          Physical Exam  General:  alert, well-developed, and well-nourished.   Lungs:  normal respiratory effort.  decreased breath sounds bilaterally,  prolonged expiration Heart:  normal rate, regular rhythm, and no gallop.     Impression & Recommendations:  Problem # 1:  DIABETES MELLITUS, TYPE II (ICD-250.00) pt already started lifestyle/dietary changes.  diet mgt for now.  monitor A1c  His updated medication list for this problem includes:    Losartan Potassium 25 Mg Tabs (Losartan potassium) ..... One by  mouth once daily  Labs Reviewed: Creat: 0.93 (02/11/2010)    Reviewed HgBA1c results: 6.6 (02/11/2010)  Problem # 2:  HYPERTENSION (ICD-401.9) BP control suboptimal.   add ARB  His updated medication list for this problem includes:    Cartia Xt 300 Mg Cp24 (Diltiazem hcl coated beads) .Marland Kitchen... Take 1 tablet by mouth once a day    Losartan Potassium 25 Mg Tabs (Losartan potassium) ..... One by mouth once daily  BP today: 140/68 Prior BP: 130/60  (02/11/2010)  Prior 10 Yr Risk Heart Disease: N/A (03/25/2009)  Labs Reviewed: K+: 4.1 (02/11/2010) Creat: : 0.93 (02/11/2010)   Chol: 219 (01/11/2008)   HDL: 28.1 (01/11/2008)   LDL: DEL (01/11/2008)   TG: 129 (01/11/2008)  Complete Medication List: 1)  Cartia Xt 300 Mg Cp24 (Diltiazem hcl coated beads) .... Take 1 tablet by mouth once a day 2)  Nexium 40 Mg Cpdr (Esomeprazole magnesium) .... One by mouth qd 3)  Advair Diskus 500-50 Mcg/dose Aepb (Fluticasone-salmeterol) .... Inhale 2 puffs two times a day 4)  Crestor 20 Mg Tabs (Rosuvastatin calcium) .... One by mouth once daily 5)  Gabapentin 300 Mg Caps (Gabapentin) .... One by mouth at bedtime as needed 6)  Ipratropium-albuterol 0.5-2.5 (3) Mg/61ml Soln (Ipratropium-albuterol) .... Use qid as needed 7)  Freestyle Lite Test Strp (Glucose blood) .... Use to check blood sugar three times a day ( 250.00) 8)  Freestyle Lancets Misc (Lancets) .... Use to test blood sugar three times a day (250.00) 9)  Losartan Potassium 25 Mg Tabs (Losartan potassium) .... One by mouth once daily  Patient Instructions: 1)  Please schedule a follow-up appointment in 2 months. 2)  BMP prior to visit, ICD-9: 401.9 3)  HbgA1C prior to visit, ICD-9: 250.00 4)  Please return for lab work one (1) week before your next appointment.  Prescriptions: LOSARTAN POTASSIUM 25 MG TABS (LOSARTAN POTASSIUM) one by mouth once daily  #30 x 3   Entered and Authorized by:   D. Thomos Lemons DO   Signed by:   D. Thomos Lemons DO on 03/23/2010   Method used:   Electronically to        [id number]* (retail)       [address] [place], [place]  [address]       Ph: [phone]       Fax: [phone]   RxID:   [id number]   Current Allergies (reviewed today): ! * PROCAINE ! NOVOCAIN LIPITOR

## 2010-10-07 NOTE — Op Note (Signed)
Summary: Penile Prosthesis Insertion & Hernia Repair/[facility]   Penile Prosthesis Insertion & Hernia Repair/[facility]   Imported By: Lanelle Bal 10/08/2009 11:47:41  _____________________________________________________________________  External Attachment:    Type:   Image     Comment:   External Document

## 2010-10-07 NOTE — Progress Notes (Signed)
Summary: CMN correction for [facility]  Phone Note Other Incoming   Summary of Call: Received fax from [facility] for CMN to cover 02/25/10 freestyle glucometer strips.  Script sent electronically for 3 x daily testing and that is what they filled.  When you completed CMN you changed testing frequency to 1 x daily. They need that corrected back to 3 x daily to cover the rx of 02/25/10. They also need reason for frequent testing added to line 5. Added "fluctuating blood sugar". Form sent back to you for correction / approval. Please advise.  Nicki Guadalajara Fergerson CMA Duncan Dull)  April 14, 2010 9:45 AM   Follow-up for Phone Call        completed.  change to 1 x day for future orders Follow-up by: D. Thomos Lemons DO,  April 19, 2010 9:26 AM  Additional Follow-up for Phone Call Additional follow up Details #1::        change in testing noted Additional Follow-up by: Glendell Docker CMA,  April 19, 2010 9:32 AM    New/Updated Medications: FREESTYLE LITE TEST  STRP (GLUCOSE BLOOD) use to check blood sugars once daily (250.00) FREESTYLE LANCETS  MISC (LANCETS) use to test blood sugar once daily (250.00)

## 2010-10-07 NOTE — Progress Notes (Signed)
Summary: Diabetic testing supplies  Phone Note Outgoing Call   Call placed by: Mervin Kung, [facility]) Call placed to: Patient Summary of Call: Received form from [facility] for diabetic testing supplies. Form was submitted to [facility] for diabetic testing supplies on 03/26/10. Left message for pt to return my call and clarify which company he is getting supplies from. Nicki Guadalajara Fergerson CMA Duncan Dull)  April 02, 2010 9:12 AM   Follow-up for Phone Call        Pt returned my call and stated that he was going to use [facility]. I advised him he should be hearing from them as we had already faxed info. to them on 03/26/10.  I will discard form from [facility].  Pt voices understanding.  Nicki Guadalajara Fergerson CMA Duncan Dull)  April 02, 2010 9:17 AM

## 2010-10-07 NOTE — Letter (Signed)
   [place] at [facility] [address] [place], [place]  [address]  [place] Phone: [phone]      April 12, 2010   FREDDIE DYMEK [address] [place], [place] [address]  RE:  LAB RESULTS  Dear  Mr. Hossain,  The following is an interpretation of your most recent lab tests.  Please take note of any instructions provided or changes to medications that have resulted from your lab work.  PSA:  normal - no follow-up needed PSA: 1.12  ELECTROLYTES:  Good - no changes needed  KIDNEY FUNCTION TESTS:  Good - no changes needed  LIVER FUNCTION TESTS:  Good - no changes needed  LIPID PANEL:  Good - no changes needed Triglyceride: 100   Cholesterol: 128   LDL: 74   HDL: 34   Chol/HDL%:  3.8 Ratio  THYROID STUDIES:  Thyroid studies normal TSH: 2.583            Sincerely Yours,    Dr. Thomos Lemons

## 2010-10-07 NOTE — Letter (Signed)
   [place] at [facility] [address] [place], [place]  [address]  [place] Phone: [phone]      May 18, 2010   Andrew [name] [address] [place], [place] [address]  RE:  LAB RESULTS  Dear  [name],  The following is an interpretation of your most recent lab tests.  Please take note of any instructions provided or changes to medications that have resulted from your lab work.  ELECTROLYTES:  Good - no changes needed  KIDNEY FUNCTION TESTS:  Good - no changes needed    DIABETIC STUDIES:  Improved - continue management Blood Glucose: 140   HgbA1C: 6.1   Microalbumin/Creatinine Ratio: 8.7          Sincerely Yours,    Dr. Thomos Lemons  Appended Document:  mailed

## 2010-10-07 NOTE — Assessment & Plan Note (Signed)
Summary: HOSPITAL FOLLOW UP/[facility]   Vital Signs:  Patient profile:   70 year old male Height:      65 inches Weight:      177 pounds BMI:     29.56 O2 Sat:      92 % on Room air Temp:     97.6 degrees F oral Pulse rate:   77 / minute Pulse rhythm:   irregular Resp:     22 per minute BP sitting:   130 / 60  (right arm) Cuff size:   large  Vitals Entered By: Glendell Docker CMA (February 11, 2010 2:25 PM)  O2 Flow:  Room air CC: Rm 3- Hospital Follow up  Is Patient Diabetic? No Comments medication changes prednisone 10, Levaquin   Primary Care Provider:  Dondra Spry DO  CC:  Rm 3- Hospital Follow up .  History of Present Illness: 70 y/o white male for hospital f/u 1. Chronic obstructive pulmonary disease with exacerbation.   2. Acute bacterial/viral bronchitis, improved.   3. History of hypertension.   4. Hyperlipidemia.   5. History of arrhythmia but no documented history of atrial       fibrillation.  EKG did not show any evidence of A Fib.   6. History of coronary artery disease.   7. History of palpitations.   8. Ongoing tobacco abuse.      DISCHARGE MEDICATIONS:   1. Prednisone tapered dose.   2. Albuterol MDI.   3. Levaquin 500 mg p.o. for 7 days.   4. Advair Diskus 1 puff inhaled twice daily.   5. __________ 300 mg p.o. daily.   6. Crestor 20 mg p.o. daily.   7. Nexium 40 mg p.o. daily.      Chest x-ray:  No acute cardiopulmonary findings.  Chest x-ray __________   changes.      Pt feeling better.  still some cough.  mild SOB.    Preventive Screening-Counseling & Management  Alcohol-Tobacco     Smoking Status: current  Allergies: 1)  ! * Procaine 2)  ! Novocain 3)  Lipitor  Past History:  Past Surgical History: S/P Lumbar laminectomy with Harrington rods.    back surgery     hernia repair     tonsillectomy    Family History: Family History of CAD Male 1st degree relative <60 Father deceased age 67 - ? medication reaction  Brother has  diabetes, CAD, emphysema/COPD        Social History: Retired Divorced Current Smoker   Alcohol use-no          Review of Systems  The patient denies fever and chest pain.    Physical Exam  General:  alert, well-developed, and well-nourished.   Head:  normocephalic and atraumatic.   Mouth:  pharynx pink and moist.   Neck:  supple and no masses.  faint wheezing Lungs:  normal respiratory effort.  decreased breath sounds bilaterally,  prolonged expiration Heart:  normal rate, regular rhythm, and no gallop.   Extremities:  No lower extremity edema  Neurologic:  cranial nerves II-XII intact and gait normal.   Psych:  normally interactive, good eye contact, not anxious appearing, and not depressed appearing.     Impression & Recommendations:  Problem # 1:  CHRONIC OBSTRUCTIVE PULMONARY DISEASE, ACUTE EXACERBATION (ICD-491.21) Assessment Improved I stressed importance of tobacco cessation.  I also rec regular use of advair.  Problem # 2:  HYPERGLYCEMIA (ICD-790.29) elevated CBG during hospitalization.  CBGs may be  aggravated by steroid use.  check A1c. we discussed avoiding sweets and limiting carbs Orders: T-Basic Metabolic Panel [phone]) T- Hemoglobin A1C ([id number])  Problem # 3:  TOBACCO ABUSE (ICD-305.1)  His updated medication list for this problem includes:    Chantix Starting Month Pak 0.5 Mg X 11 & 1 Mg X 42 Tabs (Varenicline tartrate) .Marland Kitchen... Take as directed    Chantix 1 Mg Tabs (Varenicline tartrate) ..... One by mouth bid  Complete Medication List: 1)  Cartia Xt 300 Mg Cp24 (Diltiazem hcl coated beads) .... Take 1 tablet by mouth once a day 2)  Nexium 40 Mg Cpdr (Esomeprazole magnesium) .... One by mouth qd 3)  Advair Diskus 250-50 Mcg/dose Misc (Fluticasone-salmeterol) .... One dose inh two times a day 4)  Crestor 20 Mg Tabs (Rosuvastatin calcium) .... One by mouth once daily 5)  Gabapentin 300 Mg Caps (Gabapentin) .... One by mouth at bedtime as  needed 6)  Ipratropium-albuterol 0.5-2.5 (3) Mg/53ml Soln (Ipratropium-albuterol) .... Use qid as needed 7)  Chantix Starting Month Pak 0.5 Mg X 11 & 1 Mg X 42 Tabs (Varenicline tartrate) .... Take as directed 8)  Chantix 1 Mg Tabs (Varenicline tartrate) .... One by mouth bid  Patient Instructions: 1)  Avoid concentrated sweets and sugary beverages 2)  Limit your carbohydrate intake to 30 grams per meal 3)  Please schedule a follow-up appointment in 1 month. Prescriptions: CHANTIX 1 MG TABS (VARENICLINE TARTRATE) one by mouth bid  #60 x 2   Entered and Authorized by:   D. Thomos Lemons DO   Signed by:   D. Thomos Lemons DO on 02/11/2010   Method used:   Print then Give to Patient   RxID:   [id number] CHANTIX STARTING MONTH PAK 0.5 MG X 11 & 1 MG X 42 TABS (VARENICLINE TARTRATE) take as directed  #1 x 0   Entered and Authorized by:   D. Thomos Lemons DO   Signed by:   D. Thomos Lemons DO on 02/11/2010   Method used:   Print then Give to Patient   RxID:   [id number] IPRATROPIUM-ALBUTEROL 0.5-2.5 (3) MG/3ML SOLN (IPRATROPIUM-ALBUTEROL) use qid as needed  #1 x 5   Entered and Authorized by:   D. Thomos Lemons DO   Signed by:   D. Thomos Lemons DO on 02/11/2010   Method used:   Electronically to        [id number]* (retail)       [address] [place], [place]  [address]       Ph: [phone]       Fax: [phone]   RxID:   [id number]   Current Allergies (reviewed today): ! * PROCAINE ! NOVOCAIN LIPITOR

## 2010-10-07 NOTE — Medication Information (Signed)
Summary: Order for Diabetic Testing Supplies  Order for Diabetic Testing Supplies   Imported By: Maryln Gottron 08/09/2010 11:19:09  _____________________________________________________________________  External Attachment:    Type:   Image     Comment:   External Document

## 2010-10-07 NOTE — Miscellaneous (Signed)
Summary: Orders Update  Clinical Lists Changes  Problems: Added new problem of UNSPECIFIED PERIPHERAL VASCULAR DISEASE (ICD-443.9) Orders: Added new Test order of Arterial Duplex Lower Extremity (Arterial Duplex Low) - Signed 

## 2010-10-07 NOTE — Consult Note (Signed)
Summary: [facility]  [facility]   Imported By: Lanelle Bal 04/02/2010 14:17:11  _____________________________________________________________________  External Attachment:    Type:   Image     Comment:   External Document

## 2010-10-07 NOTE — Assessment & Plan Note (Signed)
Summary: 1 month follow up/mhf   Vital Signs:  Patient profile:   70 year old male Height:      65 inches Weight:      173.50 pounds BMI:     28.98 O2 Sat:      96 % on Room air Temp:     97.8 degrees F oral Pulse rate:   69 / minute Pulse rhythm:   regular Resp:     18 per minute BP sitting:   144 / 80  (left arm) Cuff size:   large  Vitals Entered By: Glendell Docker CMA (Jan 26, 2010 3:05 PM)  O2 Flow:  Room air CC: Rm-2 -1 Month Follow up    Primary Care Provider:  Dondra Spry DO  CC:  Rm-2 -1 Month Follow up .  History of Present Illness: 70 y/o white male c/o right elbow pain  he hit elbow on Thursday,  mild  swelling and  soreness  ED - less pain.  however,  he is unhappy with overall erection quality improvement with gabapentin   Preventive Screening-Counseling & Management  Alcohol-Tobacco     Smoking Status: current  Allergies: 1)  ! * Procaine 2)  ! 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) DIVERTICULOSIS, COLON (ICD-562.10) ESOPHAGEAL STRICTURE (ICD-530.3)  SMALL BOWEL OBSTRUCTION, HX OF (ICD-V12.79) HYPERLIPIDEMIA (ICD-272.4) COPD (ICD-496)with ongoing tobacco abuse FAMILY HISTORY OF CAD MALE 1ST DEGREE RELATIVE <60 (ICD-V16.49) LOW BACK PAIN, CHRONIC (ICD-724.2) LAMINECTOMY, LUMBAR, HX OF (ICD-V45.89) ABSCESS, PERIRECTAL, HX OF (ICD-V13.3) 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    back surgery    hernia repair     tonsillectomy    Social History: Retired Divorced Current Smoker   Alcohol use-no         Physical Exam  General:  alert, well-developed, and well-nourished.   Lungs:  normal respiratory effort and normal breath sounds.   Heart:  normal rate, regular rhythm, and no gallop.   Msk:  swelling of right olecranon bursa,  no redness,  mild tenderness   Impression & Recommendations:  Problem # 1:  OLECRANON BURSITIS, RIGHT (ICD-726.33) He declines aspiration and steroid injection.  use ice and samples of voltaren gel.  Patient advised to call office if symptoms persist or worsen.  Problem # 2:  PENILE PAIN (ICD-607.89) Assessment: Improved Pain improved.  Pt still unhappy with erection quality.  F/U with urology  Complete Medication List: 1)  Cartia Xt 300 Mg Cp24 (Diltiazem hcl coated beads) .... Take 1 tablet by mouth once a day 2)  Nexium 40 Mg Cpdr (Esomeprazole magnesium) .... One by mouth qd 3)  Advair Diskus 250-50 Mcg/dose Misc (Fluticasone-salmeterol) .... One dose inh two times a day 4)  Crestor 20 Mg Tabs (Rosuvastatin calcium) .... One by mouth once daily 5)  Gabapentin 300 Mg Caps (Gabapentin) .... One by mouth at bedtime as needed  Other Orders: Future Orders: T-Basic Metabolic Panel [id number]) ... 07/19/2010 T-Hepatic Function [id number]) ... 07/19/2010 T-Lipid  Profile [id number]) ... 07/19/2010 T-TSH [id number]) ... 07/19/2010  Patient Instructions: 1)  Please schedule a follow-up appointment in 6  months. 2)  Use ice to right elbow. 3)  Call if persistent swelling or pain. 4)  BMP prior to visit, ICD-9:  401.9 5)  Hepatic Panel prior to visit, ICD-9: 272.4 6)  Lipid Panel prior to visit, ICD-9: 272.4 7)  TSH prior to visit, ICD-9: 272.4 8)  Please return for lab work one (1) week before  your next appointment.  Prescriptions: CRESTOR 20 MG  TABS (ROSUVASTATIN CALCIUM) one by mouth once daily  #30 Each x 5   Entered and Authorized by:   D. Thomos Lemons DO   Signed by:   D. Thomos Lemons DO on 01/26/2010   Method used:   Electronically to        [id number]* (retail)       [address] [place], [place]  [address]       Ph: [phone]       Fax: [phone]   RxID:   [id number] ADVAIR DISKUS 250-50 MCG/DOSE  MISC (FLUTICASONE-SALMETEROL) one dose INH two times a day  #1 x 5   Entered and Authorized by:   D. Thomos Lemons DO   Signed by:   D. Thomos Lemons DO on 01/26/2010   Method used:   Electronically to        [facility] #[id number]* (retail)       [address] [place], [place]  [address]       Ph: [phone]       Fax: [phone]   RxID:   [id number] NEXIUM 40 MG CPDR (ESOMEPRAZOLE MAGNESIUM) one by mouth qd  #30 Each x 5   Entered and Authorized by:   D. Thomos Lemons DO   Signed by:   D. Thomos Lemons DO on 01/26/2010   Method used:   Electronically to        [facility] #[id number]* (retail)       [address] [place], [place]  [address]       Ph: [phone]       Fax: [phone]   RxID:   [id number] CARTIA XT 300 MG  CP24 (DILTIAZEM HCL COATED BEADS) Take 1 tablet by mouth once a day  #30 Each x 5   Entered and Authorized by:   D. Thomos Lemons DO   Signed by:   D. Thomos Lemons DO on 01/26/2010   Method used:   Electronically to        [facility] #[id number]* (retail)       [address] [place], [place]  [address]       Ph: [phone]       Fax: [phone]   RxID:   [id number] GABAPENTIN 300 MG CAPS (GABAPENTIN) one by mouth at bedtime as needed  #30 x 3   Entered and Authorized by:   D. Thomos Lemons DO   Signed by:   D. Thomos Lemons DO on 01/26/2010   Method used:   Electronically to        [facility] #[id number]* (retail)       [address] [place], [place]   [address]       Ph: [phone]  Fax: [phone]   RxID:   [id number]   Current Allergies (reviewed today): ! * PROCAINE ! NOVOCAIN LIPITOR

## 2010-10-07 NOTE — Medication Information (Signed)
Summary: Diabetes Supplies/[facility]  Diabetes Supplies/[facility]   Imported By: Lanelle Bal 04/01/2010 11:29:09  _____________________________________________________________________  External Attachment:    Type:   Image     Comment:   External Document

## 2010-10-07 NOTE — Assessment & Plan Note (Signed)
Summary: 2 month follow up/mhf   Vital Signs:  Patient profile:   70 year old male Height:      65 inches Weight:      164.25 pounds BMI:     27.43 O2 Sat:      95 % on Room air Temp:     98.0 degrees F oral Pulse rate:   72 / minute Pulse rhythm:   regular Resp:     18 per minute BP sitting:   124 / 70  (right arm) Cuff size:   large  Vitals Entered By: Glendell Docker CMA (May 17, 2010 3:13 PM)  O2 Flow:  Room air CC: 2 month follow up, Type 2 diabetes mellitus follow-up Is Patient Diabetic? Yes Did you bring your meter with you today? No Pain Assessment Patient in pain? no        Primary Care Provider:  Dondra Spry DO  CC:  2 month follow up and Type 2 diabetes mellitus follow-up.  History of Present Illness:  Type 2 Diabetes Mellitus Follow-Up      This is a 70 year old man who presents for Type 2 diabetes mellitus follow-up.  The patient denies weight gain.  The patient denies the following symptoms: chest pain.  Since the last visit the patient reports good dietary compliance, exercising regularly, and monitoring blood glucose.    low blood sugar 88 high 220 avg 90-119 blood sugar went up after hush puppies  Preventive Screening-Counseling & Management  Alcohol-Tobacco     Smoking Status: quit  Allergies: 1)  ! * Procaine 2)  ! 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   back surgery    hernia repair   tonsillectomy    Cataract extraction - bilateral  04/2010  Family History: Family History of CAD Male 1st degree relative <60 Father deceased age 53 - ? medication reaction  Brother has diabetes, CAD, emphysema/COPD          Social History: Smoking Status:  quit  Physical Exam  General:  alert, well-developed, and well-nourished.   Lungs:  normal respiratory effort.  decreased breath sounds bilaterally,  prolonged expiration Heart:  normal rate, regular rhythm, and no gallop.   Extremities:  No lower extremity edema    Impression & Recommendations:  Problem # 1:  HYPERTENSION (ICD-401.9) Assessment Improved  His updated medication list for this problem includes:    Cartia Xt 300 Mg Cp24 (Diltiazem hcl coated beads) .Marland Kitchen... Take 1 tablet by mouth once a day    Losartan Potassium 25 Mg Tabs (Losartan potassium) ..... One by mouth once daily  BP today: 124/70 Prior BP: 140/68 (  03/23/2010)  Prior 10 Yr Risk Heart Disease: N/A (03/25/2009)  Labs Reviewed: K+: 4.9 (04/09/2010) Creat: : 0.96 (04/09/2010)   Chol: 128 (04/09/2010)   HDL: 34 (04/09/2010)   LDL: 74 (04/09/2010)   TG: 100 (04/09/2010)  Problem # 2:  DIABETES MELLITUS, TYPE II (ICD-250.00) good control with diet and exercise.  monitor A1c.  reinforced dietary / lifestyle changes  His updated medication list for this problem includes:    Losartan Potassium  25 Mg Tabs (Losartan potassium) ..... One by mouth once daily  Orders: T-Basic Metabolic Panel [id number]) T- Hemoglobin A1C ([id number]) T-Urine Microalbumin w/creat. ratio [phone])  Labs Reviewed: Creat: 0.96 (04/09/2010)    Reviewed HgBA1c results: 6.6 (02/11/2010)  Problem # 3:  COPD (ICD-496) Assessment: Unchanged  His updated medication list for this problem includes:    Advair Diskus 500-50 Mcg/dose Aepb (Fluticasone-salmeterol) ..... Inhale 2 puffs two times a day    Ipratropium-albuterol 0.5-2.5 (3) Mg/60ml Soln (Ipratropium-albuterol) ..... Use qid as needed  Complete Medication List: 1)  Cartia Xt 300 Mg Cp24 (Diltiazem hcl coated beads) .... Take 1 tablet by mouth once a day 2)  Nexium 40 Mg Cpdr (Esomeprazole magnesium) .... One by mouth qd 3)  Advair Diskus 500-50 Mcg/dose Aepb (Fluticasone-salmeterol) .... Inhale 2 puffs two times a day 4)  Crestor 20 Mg Tabs (Rosuvastatin calcium) .... One by mouth once daily 5)  Ipratropium-albuterol 0.5-2.5 (3) Mg/75ml Soln (Ipratropium-albuterol) .... Use qid as needed 6)  Freestyle Lite Test Strp (Glucose blood) .... Use to check blood sugars once daily (250.00) 7)  Freestyle Lancets Misc (Lancets) .... Use to test blood sugar once daily (250.00) 8)  Losartan Potassium 25 Mg Tabs (Losartan potassium) .... One by mouth once daily  Patient Instructions: 1)  Please schedule a follow-up appointment in 4 months. 2)  BMP prior to visit, ICD-9:  250.00 3)  HbgA1C prior to visit, ICD-9: 250.00 4)  Please return for lab work one (1) week before your next appointment.   Current Allergies (reviewed today): ! * PROCAINE ! NOVOCAIN LIPITOR    Immunization History:  Influenza Immunization History:    Influenza:  declined  (05/17/2010)   Contraindications/Deferment of Procedures/Staging:    Test/Procedure: FLU VAX    Reason for deferment: patient declined

## 2010-10-07 NOTE — Progress Notes (Signed)
Summary: [facility] test strip order  Phone Note Other Incoming   Reason for Call: Get patient information Request: Send information Action Taken: Information Sent Summary of Call: Received fax from [facility] for diabetic testing supplies. Called [phone] and spoke to [place]let. I advised her per pt. he is not going to be using their company for supplies. She states that they sent pt a bottle of test strips on 02/25/10 and they need form completed to bill his insurance for the strips. I asked why the form read as if pt will receive all supplies and she stated that all info. must be on the form per Medicare guidelines but they would only bill his insurance for 1 bottle of test strips. Signed form from 03/17/10 reprinted and faxed to [phone].  Nicki Guadalajara Fergerson CMA Duncan Dull)  April 13, 2010 9:08 AM

## 2010-10-07 NOTE — Assessment & Plan Note (Signed)
Summary: 6 month fu/dt   Vital Signs:  Patient profile:   70 year old Andrew [name] Height:      65 inches Weight:      169.75 pounds BMI:     28.35 O2 Sat:      92 % on Room air Temp:     97.6 degrees F oral Pulse rate:   Andrew / minute Pulse rhythm:   regular Resp:     18 per minute BP sitting:   104 / 60  (left arm) Cuff size:   large  Vitals Entered By: Glendell Docker CMA (December 29, 2009 4:10 PM)  O2 Flow:  Room air CC: Rm 2- 6 Month Follow up disease management Comments discuss concerns with Dr   Primary Care Provider:  Dondra Spry DO  CC:  Rm 2- 6 Month Follow up disease management.  History of Present Illness: 70 y/o white Andrew [name] for f/u interval hx -  had surgery revising penile implant he is experiencing significant pain with sexual activity base of penis is very tender right side of penis feels numb he is very unhappy with surgical outcome he has f/u with urologist in 1 week poor sleep due to burning pain at base of penis  htn - stable  Preventive Screening-Counseling & Management  Alcohol-Tobacco     Smoking Status: current  Allergies: 1)  ! * Procaine 2)  ! 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    back surgery    hernia repair    tonsillectomy    Family History: Family History of CAD Andrew [name] 1st degree relative <60 Father deceased age 75 - ? medication reaction Brother has diabetes, CAD, emphysema/COPD        Social History: Retired Divorced Current Smoker  Alcohol use-no         Physical Exam  General:  alert, well-developed, and well-nourished.   Lungs:  normal respiratory effort and normal breath sounds.   Heart:  normal rate, regular rhythm, and no gallop.   Genitalia:  circumcised.  no redness around incision.   base of penis tender,  right lower shaft of penis tender   Impression & Recommendations:  Problem # 1:  PENILE PAIN (ICD-607.89) Pt's revision of penile prosthesis complicated by significant pain and numbness of right side of penis.   trial of gabapentin. he has f/u with urologist  Problem # 2:  HYPERTENSION (ICD-401.9) stable.  Maintain current medication regimen.  His updated medication list for this problem includes:    Cartia Xt 300 Mg Cp24 (Diltiazem hcl coated beads) .Marland Kitchen... Take 1 tablet by mouth once a day  BP today: 104/60 Prior BP: 140/70 (06/01/2009)  Prior 10 Yr  Risk Heart Disease: N/A (03/25/2009)  Labs Reviewed: K+: 4.8 (03/25/2009) Creat: : 0.9 (03/25/2009)   Chol: 219 (01/11/2008)   HDL: 28.1 (01/11/2008)   LDL: DEL (01/11/2008)   TG: 129 (01/11/2008)  Complete Medication List: 1)  Cartia Xt 300 Mg Cp24 (Diltiazem hcl coated beads) .... Take 1 tablet by mouth once a day 2)  Nexium 40 Mg Cpdr (Esomeprazole magnesium) .... One by mouth qd 3)  Advair Diskus 250-50 Mcg/dose Misc  (Fluticasone-salmeterol) .... One dose inh two times a day 4)  Crestor 20 Mg Tabs (Rosuvastatin calcium) .... One by mouth once daily 5)  Gabapentin 100 Mg Caps (Gabapentin) .... One to two tabs by mouth at bedtime as needed  Patient Instructions: 1)  Please schedule a follow-up appointment in 1 month. Prescriptions: GABAPENTIN 100 MG CAPS (GABAPENTIN) one to two tabs by mouth at bedtime as needed  #60 x 0   Entered and Authorized by:   D. Thomos Lemons DO   Signed by:   D. Thomos Lemons DO on 12/29/2009   Method used:   Electronically to        [id number]* (retail)       [address] [place], [place]  [address]       Ph: [phone]       Fax: [phone]   RxID:   [id number]   Current Allergies (reviewed today): ! * PROCAINE ! NOVOCAIN LIPITOR

## 2010-10-07 NOTE — Letter (Signed)
    at [facility] [address] [place], [place]  [address]  [place] Phone: [phone]      September 14, 2010   Jhayden Rawlinson [place] [address] [place], [place] [address]  RE:  LAB RESULTS  Dear  Mr. Belardo,  The following is an interpretation of your most recent lab tests.  Please take note of any instructions provided or changes to medications that have resulted from your lab work.  ELECTROLYTES:  Good - no changes needed  KIDNEY FUNCTION TESTS:  Good - no changes needed    DIABETIC STUDIES:  Good - no changes needed Blood Glucose: 80   HgbA1C: 6.3   Microalbumin/Creatinine Ratio: 8.7     CPK (muscle enzyme test) - normal  Magnesium level - normal  Vitamin D level - normal         Sincerely Yours,    Dr. Thomos Lemons  Appended Document:  mailed

## 2010-10-07 NOTE — Progress Notes (Signed)
Summary: Medication Refills  Phone Note Call from Patient Call back at Home Phone [phone]   Caller: Patient Call For: D. Thomos Lemons DO Action Taken: Patient advised to call 911 Summary of Call: patient is requesting a prescription for test strips and lancet for Freestyle Lite, and Advair 500/50. If approved he is requesting a refill sent to [facility] at [facility] Initial call taken by: Glendell Docker CMA,  February 25, 2010 11:48 AM  Follow-up for Phone Call        ok to refill x 3 Follow-up by: D. Thomos Lemons DO,  February 25, 2010 1:38 PM  Additional Follow-up for Phone Call Additional follow up Details #1::        patient advised rx sent to pharmacy Additional Follow-up by: Glendell Docker CMA,  February 25, 2010 2:36 PM    New/Updated Medications: ADVAIR DISKUS 500-50 MCG/DOSE AEPB (FLUTICASONE-SALMETEROL) inhale 2 puffs two times a day FREESTYLE LITE TEST  STRP (GLUCOSE BLOOD) use to check blood sugar three times a day ( 250.00) FREESTYLE LANCETS  MISC (LANCETS) use to test blood sugar three times a day (250.00) Prescriptions: FREESTYLE LANCETS  MISC (LANCETS) use to test blood sugar three times a day (250.00)  #90 x 3   Entered by:   Glendell Docker CMA   Authorized by:   D. Thomos Lemons DO   Signed by:   Glendell Docker CMA on 02/25/2010   Method used:   Electronically to        [facility] #[id number]* (retail)       [address] [place], [place]  [address]       Ph: [phone]       Fax: [phone]   RxID:   [id number] FREESTYLE LITE TEST  STRP (GLUCOSE BLOOD) use to check blood sugar three times a day ( 250.00)  #90 x 3   Entered by:   Glendell Docker CMA   Authorized by:   D. Thomos Lemons DO   Signed by:   Glendell Docker CMA on 02/25/2010   Method used:   Electronically to        [facility] #[id number]* (retail)       [address] [place], [place]  [address]       Ph: [phone]       Fax: [phone]   RxID:    [id number] ADVAIR DISKUS 500-50 MCG/DOSE AEPB (FLUTICASONE-SALMETEROL) inhale 2 puffs two times a day  #30 x 5   Entered by:   Glendell Docker CMA   Authorized by:   D. Thomos Lemons DO   Signed by:   Glendell Docker CMA on 02/25/2010   Method used:   Electronically to        [facility] #[id number]* (retail)       [address] [place], [place]  [address]       Ph: [phone]       Fax: [phone]   RxID:   [id number]

## 2010-10-07 NOTE — Progress Notes (Signed)
Summary: lab result  Phone Note Outgoing Call   Summary of Call: call pt - blood work shows he is mild diabetic.   continue to avoid sweets and decrease carbohydrate intake to 30 grams per meal.  I suggest referral to diabetic educator Initial call taken by: D. Thomos Lemons DO,  February 12, 2010 8:08 AM  Follow-up for Phone Call        Advised pt per Dr. Olegario Messier instructions and that Myriam Jacobson will contact him with Diabetes referral date and time.  Nicki Guadalajara Fergerson CMA  February 12, 2010 1:01 PM   New Problems: DIABETES MELLITUS, TYPE II (ICD-250.00)   New Problems: DIABETES MELLITUS, TYPE II (ICD-250.00)

## 2010-10-07 NOTE — Assessment & Plan Note (Signed)
Summary: 4 month follow up/mhf   Vital Signs:  Patient profile:   70 year old male Height:      65 inches Weight:      164 pounds BMI:     27.39 O2 Sat:      96 % on Room air Temp:     97.6 degrees F oral Pulse rate:   66 / minute Pulse rhythm:   regular Resp:     18 per minute BP sitting:   110 / 70  (left arm) Cuff size:   regular  Vitals Entered By: Mervin Kung CMA (AAMA) (September 13, 2010 3:00 PM)  O2 Flow:  Room air CC: Pt here for 4 month follow up., Type 2 diabetes mellitus follow-up Is Patient Diabetic? Yes Pain Assessment Patient in pain? no      Comments Pt states he is not checking his blood sugars any longer as they were always normal.  Needs refill on Ipratropium.  Pt states he recently had all meds refilled. Mervin Kung CMA Duncan Dull)  September 13, 2010 3:07 PM    Primary Care Provider:  Dondra Spry DO  CC:  Pt here for 4 month follow up. and Type 2 diabetes mellitus follow-up.  History of Present Illness:  Type 2 Diabetes Mellitus Follow-Up      This is a 70 year old man who presents for Type 2 diabetes mellitus follow-up.  The patient denies weight loss and weight gain.  The patient denies the following symptoms: chest pain.  Since the last visit the patient reports good dietary compliance and not monitoring blood glucose.    Preventive Screening-Counseling & Management  Alcohol-Tobacco     Smoking Status: current     Packs/Day: 1.0     Pack years: 65  Allergies: 1)  ! * Procaine 2)  ! 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) DIVERTICULOSIS, COLON  (ICD-562.10) ESOPHAGEAL STRICTURE (ICD-530.3)  Bilateral cataracts SMALL BOWEL OBSTRUCTION, HX OF (ICD-V12.79) HYPERLIPIDEMIA (ICD-272.4) COPD (ICD-496)with ongoing tobacco abuse FAMILY HISTORY OF CAD MALE 1ST DEGREE RELATIVE <60 (ICD-V16.49) LOW BACK PAIN, CHRONIC (ICD-724.2) LAMINECTOMY, LUMBAR, HX OF (ICD-V45.89) ABSCESS, PERIRECTAL, HX OF (ICD-V13.3) 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   back surgery    hernia repair   tonsillectomy    Cataract extraction - bilateral  04/2010    Social History: Smoking Status:  current Packs/Day:  1.0  Review of Systems       nocturnal leg cramps   Physical Exam  General:  alert, well-developed, and well-nourished.   Lungs:  normal respiratory effort.  decreased breath sounds bilaterally,  prolonged expiration Heart:  normal rate, regular rhythm, and no gallop.   Pulses:  diminished PD and PT pulses bilaterally Neurologic:  cranial nerves II-XII intact and gait normal.     Impression & Recommendations:  Problem # 1:  LEG CRAMPS (ICD-729.82) Assessment New  Orders: T-CK Total (04540-98119) T-Magnesium ([id number]) T- * Misc. Laboratory test [phone])  LE Arterial Doppler/ABI (Le arterial doppler)  Problem # 2:  DIABETES MELLITUS, TYPE II (ICD-250.00) Assessment: Unchanged  His updated medication list for this problem includes:    Losartan Potassium 25 Mg Tabs (Losartan potassium) ..... One by mouth once daily  Orders: T-Basic Metabolic Panel [phone]) T- Hemoglobin A1C [phone])  Labs Reviewed: Creat: 0.92 (05/17/2010)    Reviewed HgBA1c results: 6.1 (05/17/2010)  6.6 (02/11/2010)  Problem #  3:  HYPERTENSION (ICD-401.9)  His updated medication list for this problem includes:    Cartia Xt 300 Mg Cp24 (Diltiazem hcl coated beads) .Marland Kitchen... Take 1 tablet by mouth once a day    Losartan Potassium 25 Mg Tabs (Losartan potassium) ..... One by mouth once daily  BP today: 110/70 Prior BP: 124/70 (05/17/2010)  Prior 10 Yr Risk Heart Disease: N/A (03/25/2009)  Labs Reviewed: K+: 4.5 (05/17/2010) Creat: : 0.92 (05/17/2010)   Chol: 128 (04/09/2010)   HDL: 34 (04/09/2010)   LDL: 74 (04/09/2010)   TG: 100 (04/09/2010)  Complete Medication List: 1)  Cartia Xt 300 Mg Cp24 (Diltiazem hcl coated beads) .... Take 1 tablet by mouth once a day 2)  Nexium 40 Mg Cpdr (Esomeprazole magnesium) .... One by mouth qd 3)  Advair Diskus 500-50 Mcg/dose Aepb (Fluticasone-salmeterol) .... Inhale 2 puffs two times a day 4)  Crestor 20 Mg Tabs (Rosuvastatin calcium) .... One by mouth once daily 5)  Ipratropium-albuterol 0.5-2.5 (3) Mg/72ml Soln (Ipratropium-albuterol) .... Use qid as needed 6)  Freestyle Lite Test Strp (Glucose blood) .... Use to check blood sugars once daily (250.00) 7)  Freestyle Lancets Misc (Lancets) .... Use to test blood sugar once daily (250.00) 8)  Losartan Potassium 25 Mg Tabs (Losartan potassium) .... One by mouth once daily  Patient Instructions: 1)  Our office will contact you re:  referral for PAD testing 2)  Please schedule a follow-up appointment in 3 months. Prescriptions: LOSARTAN POTASSIUM 25 MG TABS (LOSARTAN POTASSIUM) one by mouth once daily  #30 x 5   Entered and Authorized by:   D. Thomos Lemons DO   Signed by:   D. Thomos Lemons DO on 09/13/2010   Method used:   Electronically to        [id number]* (retail)       [address] [place], [place]  [address]       Ph: [phone]       Fax: [phone]   RxID:   [id number] IPRATROPIUM-ALBUTEROL 0.5-2.5 (3) MG/3ML SOLN (IPRATROPIUM-ALBUTEROL) use qid as needed  #1 x 5   Entered and Authorized  by:   D. Thomos Lemons DO   Signed by:   D. Thomos Lemons DO on 09/13/2010   Method used:   Electronically to        [id number]* (retail)       [address] [place], [place]  [address]       Ph: [phone]       Fax: [phone]   RxID:   [id number] CRESTOR 20 MG  TABS (ROSUVASTATIN CALCIUM) one by mouth once daily  #30 Each x 5   Entered and Authorized by:   D. Thomos Lemons DO   Signed by:   D. Thomos Lemons DO on 09/13/2010   Method used:   Electronically to        [id number]* (retail)       [phone]       [place], [place]  [address]       Ph: [phone]       Fax: [phone]   RxID:   [id number] ADVAIR DISKUS 500-50 MCG/DOSE AEPB (FLUTICASONE-SALMETEROL) inhale 2 puffs two times a day  #30 x 5   Entered and Authorized by:   D. Thomos Lemons DO   Signed by:   D. Thomos Lemons DO on 09/13/2010   Method used:   Electronically to        [id number]* (retail)       [address] [place], [place]  [address]       Ph: [phone]       Fax: [phone]   RxID:   [id number] NEXIUM 40 MG CPDR (ESOMEPRAZOLE MAGNESIUM) one by mouth qd  #30 Each x 5   Entered and Authorized by:   D. Thomos Lemons DO   Signed by:   D. Thomos Lemons DO on 09/13/2010   Method used:   Electronically to        [id number]* (retail)       [address] [place], [place]  [address]       Ph: [phone]       Fax: [phone]   RxID:   [id number] CARTIA XT 300 MG  CP24 (DILTIAZEM HCL COATED BEADS) Take 1 tablet by mouth once a day  #30 Each x 5   Entered and Authorized by:   D. Thomos Lemons DO   Signed by:   D. Thomos Lemons DO on 09/13/2010   Method used:   Electronically to        [id number]* (retail)       [address] [place], [place]  [address]       Ph: [phone]       Fax: [phone]   RxID:   [id number]    Orders Added: 1)  T-Basic Metabolic Panel [phone] 2)   T- Hemoglobin A1C [83036-23375] 3)  T-CK Total [82550-23250] 4)  T-Magnesium [40086-76195] 5)  T- * Misc. Laboratory test [99999] 6)  LE Arterial Doppler/ABI [Le arterial doppler] 7)  Est. Patient Level III [[address]]    Current Allergies (reviewed today): ! * PROCAINE ! NOVOCAIN LIPITOR

## 2010-10-07 NOTE — Letter (Signed)
Summary: CMN for Nebulizer/[facility]  CMN for Nebulizer/[facility]   Imported By: Lanelle Bal 02/22/2010 11:48:36  _____________________________________________________________________  External Attachment:    Type:   Image     Comment:   External Document

## 2010-10-07 NOTE — Letter (Signed)
Summary: CMN for Diabetes Supplies/[facility]  CMN for Diabetes Supplies/[facility]   Imported By: Lanelle Bal 03/25/2010 10:22:19  _____________________________________________________________________  External Attachment:    Type:   Image     Comment:   External Document

## 2010-10-07 NOTE — Medication Information (Signed)
Summary: Diabetes Supplies/[facility]  Diabetes Supplies/[facility]   Imported By: Lanelle Bal 03/31/2010 10:37:57  _____________________________________________________________________  External Attachment:    Type:   Image     Comment:   External Document

## 2010-10-07 NOTE — Progress Notes (Signed)
Summary: lower extremity arterial result  Phone Note Outgoing Call   Summary of Call: call pt - lower ext vascular study negative for PAD Initial call taken by: D. Thomos Lemons DO,  September 28, 2010 6:04 PM  Follow-up for Phone Call        Pt informed. Nicki Guadalajara Fergerson CMA Duncan Dull)  September 29, 2010 8:56 AM

## 2010-10-27 NOTE — Letter (Signed)
Summary: Andrew [name] New patient Consultation Report  Andrew [name] New patient Consultation Report   Imported By: Kassie Mends 10/15/2010 10:17:58  _____________________________________________________________________  External Attachment:    Type:   Image     Comment:   External Document

## 2010-11-11 ENCOUNTER — Telehealth: Payer: Self-pay | Admitting: Internal Medicine

## 2010-11-16 NOTE — Progress Notes (Signed)
Summary: refill-advair, losartan, diltiazem  Phone Note Refill Request Message from:  Fax from Pharmacy on November 11, 2010 2:58 PM  Refills Requested: Medication #1:  advair 500-50 diskus inhale 1 dose by mouth twice daily   Brand Name Necessary? No   Supply Requested: 1 month   Last Refilled: 10/11/2010  Medication #2:  LOSARTAN POTASSIUM 25 MG TABS one by mouth once daily.   Dosage confirmed as above?Dosage Confirmed   Brand Name Necessary? No   Supply Requested: 1 month   Last Refilled: 10/11/2010  Medication #3:  diltiazem 24hr er 300 mg cap take 1 capsule by mouth once daily   Brand Name Necessary? No   Supply Requested: 1 month [facility] [address] [place] [address] fax [phone]   Method Requested: Electronic Next Appointment Scheduled: 4.10.12 yoo Initial call taken by: Elba Barman,  November 11, 2010 3:02 PM    Prescriptions: LOSARTAN POTASSIUM 25 MG TABS (LOSARTAN POTASSIUM) one by mouth once daily  #30 x 2   Entered by:   Mervin Kung CMA (AAMA)   Authorized by:   D. Thomos Lemons DO   Signed by:   Mervin Kung CMA (AAMA) on 11/11/2010   Method used:   Electronically to        [facility] [address] (retail)       [address]       [place], [place]  [id number]       Ph: [phone]       Fax: [phone]   RxID:   [id number] ADVAIR DISKUS 500-50 MCG/DOSE AEPB (FLUTICASONE-SALMETEROL) inhale 2 puffs two times a day  #2 x 2   Entered by:   Mervin Kung CMA (AAMA)   Authorized by:   D. Thomos Lemons DO   Signed by:   Mervin Kung CMA (AAMA) on 11/11/2010   Method used:   Electronically to        [facility] (retail)       [address]       [place], [place]  [address]       Ph: [phone]       Fax: [phone]   RxID:   [id number] CARTIA XT 300 MG  CP24 (DILTIAZEM HCL COATED BEADS) Take 1 tablet by mouth once a day  #30 Each x 2   Entered by:   Mervin Kung CMA  (AAMA)   Authorized by:   D. Thomos Lemons DO   Signed by:   Mervin Kung CMA (AAMA) on 11/11/2010   Method used:   Electronically to        [facility] [address] (retail)       [address]       [place], [place]  [address]       Ph: [phone]       Fax: [phone]   RxID:   [id number]

## 2010-11-18 LAB — GLUCOSE, CAPILLARY
Glucose-Capillary: 105 mg/dL — ABNORMAL HIGH (ref 70–99)
Glucose-Capillary: 119 mg/dL — ABNORMAL HIGH (ref 70–99)
Glucose-Capillary: 154 mg/dL — ABNORMAL HIGH (ref 70–99)

## 2010-11-18 LAB — SURGICAL PCR SCREEN
MRSA, PCR: NEGATIVE
Staphylococcus aureus: NEGATIVE

## 2010-11-18 LAB — CBC
Hemoglobin: 13.7 g/dL (ref 13.0–17.0)
RBC: 4.83 MIL/uL (ref 4.22–5.81)
WBC: 6.8 10*3/uL (ref 4.0–10.5)

## 2010-11-18 LAB — BASIC METABOLIC PANEL
BUN: 24 mg/dL — ABNORMAL HIGH (ref 6–23)
Chloride: 113 mEq/L — ABNORMAL HIGH (ref 96–112)
GFR calc Af Amer: >60 mL/min (ref >60)
GFR calc non Af Amer: >60 mL/min (ref >60)
Potassium: 4.9 mEq/L (ref 3.5–5.1)
Sodium: 140 mEq/L (ref 135–145)

## 2010-11-22 LAB — CK TOTAL AND CKMB (NOT AT ARMC)
Relative Index: INVALID (ref 0.0–2.5)
Total CK: 66 U/L (ref 7–232)

## 2010-11-22 LAB — BASIC METABOLIC PANEL
BUN: 9 mg/dL (ref 6–23)
CO2: 24 mEq/L (ref 19–32)
CO2: 27 mEq/L (ref 19–32)
Calcium: 8.4 mg/dL (ref 8.4–10.5)
Chloride: 102 mEq/L (ref 96–112)
Chloride: 107 mEq/L (ref 96–112)
Chloride: 108 mEq/L (ref 96–112)
Creatinine, Ser: 0.88 mg/dL (ref 0.4–1.5)
Creatinine, Ser: 0.91 mg/dL (ref 0.4–1.5)
GFR calc Af Amer: >60 mL/min (ref >60)
GFR calc non Af Amer: >60 mL/min (ref >60)
Glucose, Bld: 145 mg/dL — ABNORMAL HIGH (ref 70–99)
Glucose, Bld: 213 mg/dL — ABNORMAL HIGH (ref 70–99)
Potassium: 4.2 mEq/L (ref 3.5–5.1)
Potassium: 4.8 mEq/L (ref 3.5–5.1)
Sodium: 141 mEq/L (ref 135–145)

## 2010-11-22 LAB — URINALYSIS, ROUTINE W REFLEX MICROSCOPIC
Bilirubin Urine: NEGATIVE
Bilirubin Urine: NEGATIVE
Glucose, UA: 100 mg/dL — AB
Glucose, UA: NEGATIVE mg/dL
Hgb urine dipstick: NEGATIVE
Ketones, ur: NEGATIVE mg/dL
Ketones, ur: NEGATIVE mg/dL
Protein, ur: NEGATIVE mg/dL
pH: 6 (ref 5.0–8.0)
pH: 7 (ref 5.0–8.0)

## 2010-11-22 LAB — CBC
HCT: 37.7 % — ABNORMAL LOW (ref 39.0–52.0)
Hemoglobin: 12.4 g/dL — ABNORMAL LOW (ref 13.0–17.0)
Hemoglobin: 12.5 g/dL — ABNORMAL LOW (ref 13.0–17.0)
MCHC: 33.1 g/dL (ref 30.0–36.0)
MCHC: 34.2 g/dL (ref 30.0–36.0)
MCV: 86.1 fL (ref 78.0–100.0)
MCV: 86.1 fL (ref 78.0–100.0)
MCV: 86.5 fL (ref 78.0–100.0)
Platelets: 181 10*3/uL (ref 150–400)
RBC: 4.9 MIL/uL (ref 4.22–5.81)
RDW: 15.2 % (ref 11.5–15.5)
RDW: 15.3 % (ref 11.5–15.5)
WBC: 12.2 10*3/uL — ABNORMAL HIGH (ref 4.0–10.5)

## 2010-11-22 LAB — CULTURE, BLOOD (ROUTINE X 2): Culture: NO GROWTH

## 2010-11-22 LAB — DIFFERENTIAL
Basophils Absolute: 0.1 10*3/uL (ref 0.0–0.1)
Basophils Relative: 1 % (ref 0–1)
Eosinophils Absolute: 0 10*3/uL (ref 0.0–0.7)
Eosinophils Absolute: 0.1 10*3/uL (ref 0.0–0.7)
Lymphs Abs: 1 10*3/uL (ref 0.7–4.0)
Monocytes Absolute: 0.9 10*3/uL (ref 0.1–1.0)
Monocytes Relative: 8 % (ref 3–12)
Neutro Abs: 8.4 10*3/uL — ABNORMAL HIGH (ref 1.7–7.7)
Neutrophils Relative %: 80 % — ABNORMAL HIGH (ref 43–77)
Neutrophils Relative %: 84 % — ABNORMAL HIGH (ref 43–77)

## 2010-11-22 LAB — GLUCOSE, CAPILLARY: Glucose-Capillary: 156 mg/dL — ABNORMAL HIGH (ref 70–99)

## 2010-11-22 LAB — URINE CULTURE: Colony Count: 15000

## 2010-11-22 LAB — LACTIC ACID, PLASMA: Lactic Acid, Venous: 2.2 mmol/L (ref 0.5–2.2)

## 2010-12-14 ENCOUNTER — Ambulatory Visit: Payer: Self-pay | Admitting: Internal Medicine

## 2010-12-20 LAB — LIPID PANEL
Cholesterol: 112 mg/dL (ref 0–200)
HDL: 21 mg/dL — ABNORMAL LOW (ref >39)
LDL Cholesterol: 63 mg/dL (ref 0–99)
Triglycerides: 138 mg/dL (ref <150)

## 2010-12-20 LAB — STOOL CULTURE

## 2010-12-20 LAB — CLOSTRIDIUM DIFFICILE EIA

## 2010-12-20 LAB — COMPREHENSIVE METABOLIC PANEL
AST: 15 U/L (ref 0–37)
BUN: 12 mg/dL (ref 6–23)
CO2: 31 mEq/L (ref 19–32)
Calcium: 8.9 mg/dL (ref 8.4–10.5)
Chloride: 96 mEq/L (ref 96–112)
Creatinine, Ser: 0.9 mg/dL (ref 0.4–1.5)
GFR calc Af Amer: >60 mL/min (ref >60)
GFR calc non Af Amer: >60 mL/min (ref >60)
Total Bilirubin: 0.7 mg/dL (ref 0.3–1.2)

## 2010-12-20 LAB — CK TOTAL AND CKMB (NOT AT ARMC)
CK, MB: 0.8 ng/mL (ref 0.3–4.0)
Relative Index: INVALID (ref 0.0–2.5)
Total CK: 56 U/L (ref 7–232)

## 2010-12-20 LAB — BASIC METABOLIC PANEL
BUN: 5 mg/dL — ABNORMAL LOW (ref 6–23)
CO2: 27 mEq/L (ref 19–32)
Glucose, Bld: 106 mg/dL — ABNORMAL HIGH (ref 70–99)
Potassium: 3.5 mEq/L (ref 3.5–5.1)
Sodium: 143 mEq/L (ref 135–145)

## 2010-12-20 LAB — URINALYSIS, ROUTINE W REFLEX MICROSCOPIC
Nitrite: NEGATIVE
Specific Gravity, Urine: 1.011 (ref 1.005–1.030)
Urobilinogen, UA: 0.2 mg/dL (ref 0.0–1.0)
pH: 7 (ref 5.0–8.0)

## 2010-12-20 LAB — CBC
HCT: 47.2 % (ref 39.0–52.0)
MCHC: 32.8 g/dL (ref 30.0–36.0)
MCV: 86.1 fL (ref 78.0–100.0)
Platelets: 176 10*3/uL (ref 150–400)
RBC: 5.48 MIL/uL (ref 4.22–5.81)

## 2010-12-20 LAB — CARDIAC PANEL(CRET KIN+CKTOT+MB+TROPI)
CK, MB: 1.2 ng/mL (ref 0.3–4.0)
Relative Index: INVALID (ref 0.0–2.5)
Relative Index: INVALID (ref 0.0–2.5)
Troponin I: 0.01 ng/mL (ref 0.00–0.06)

## 2010-12-20 LAB — HEMOGLOBIN A1C
Hgb A1c MFr Bld: 6.1 % (ref 4.6–6.1)
Mean Plasma Glucose: 128 mg/dL

## 2010-12-20 LAB — LIPASE, BLOOD: Lipase: 26 U/L (ref 11–59)

## 2010-12-20 LAB — TSH: TSH: 2.05 u[IU]/mL (ref 0.350–4.500)

## 2010-12-20 LAB — FECAL LACTOFERRIN, QUANT: Fecal Lactoferrin: POSITIVE

## 2010-12-20 LAB — DIFFERENTIAL
Basophils Absolute: 0.1 10*3/uL (ref 0.0–0.1)
Eosinophils Relative: 1 % (ref 0–5)
Lymphocytes Relative: 18 % (ref 12–46)
Lymphs Abs: 1.5 10*3/uL (ref 0.7–4.0)
Neutro Abs: 5.8 10*3/uL (ref 1.7–7.7)

## 2010-12-20 LAB — PROTIME-INR: Prothrombin Time: 13.1 seconds (ref 11.6–15.2)

## 2010-12-21 ENCOUNTER — Ambulatory Visit (INDEPENDENT_AMBULATORY_CARE_PROVIDER_SITE_OTHER): Payer: Medicare Other | Admitting: Internal Medicine

## 2010-12-21 ENCOUNTER — Emergency Department (HOSPITAL_BASED_OUTPATIENT_CLINIC_OR_DEPARTMENT_OTHER)
Admission: EM | Admit: 2010-12-21 | Discharge: 2010-12-21 | Disposition: A | Discharge status: Home / Self Care | Payer: Medicare Other | Attending: Emergency Medicine | Admitting: Emergency Medicine

## 2010-12-21 ENCOUNTER — Encounter: Payer: Self-pay | Admitting: Internal Medicine

## 2010-12-21 ENCOUNTER — Emergency Department (INDEPENDENT_AMBULATORY_CARE_PROVIDER_SITE_OTHER): Payer: Medicare Other

## 2010-12-21 DIAGNOSIS — F17200 Nicotine dependence, unspecified, uncomplicated: Secondary | ICD-10-CM | POA: Insufficient documentation

## 2010-12-21 DIAGNOSIS — R42 Dizziness and giddiness: Secondary | ICD-10-CM

## 2010-12-21 DIAGNOSIS — K5730 Diverticulosis of large intestine without perforation or abscess without bleeding: Secondary | ICD-10-CM

## 2010-12-21 DIAGNOSIS — K449 Diaphragmatic hernia without obstruction or gangrene: Secondary | ICD-10-CM | POA: Insufficient documentation

## 2010-12-21 DIAGNOSIS — I7411 Embolism and thrombosis of thoracic aorta: Secondary | ICD-10-CM

## 2010-12-21 DIAGNOSIS — R109 Unspecified abdominal pain: Secondary | ICD-10-CM

## 2010-12-21 DIAGNOSIS — Z8739 Personal history of other diseases of the musculoskeletal system and connective tissue: Secondary | ICD-10-CM | POA: Insufficient documentation

## 2010-12-21 DIAGNOSIS — J4489 Other specified chronic obstructive pulmonary disease: Secondary | ICD-10-CM | POA: Insufficient documentation

## 2010-12-21 DIAGNOSIS — R142 Eructation: Secondary | ICD-10-CM

## 2010-12-21 DIAGNOSIS — I10 Essential (primary) hypertension: Secondary | ICD-10-CM | POA: Insufficient documentation

## 2010-12-21 DIAGNOSIS — E785 Hyperlipidemia, unspecified: Secondary | ICD-10-CM | POA: Insufficient documentation

## 2010-12-21 DIAGNOSIS — E119 Type 2 diabetes mellitus without complications: Secondary | ICD-10-CM

## 2010-12-21 DIAGNOSIS — I2510 Atherosclerotic heart disease of native coronary artery without angina pectoris: Secondary | ICD-10-CM | POA: Insufficient documentation

## 2010-12-21 DIAGNOSIS — I951 Orthostatic hypotension: Secondary | ICD-10-CM

## 2010-12-21 DIAGNOSIS — I4891 Unspecified atrial fibrillation: Secondary | ICD-10-CM | POA: Insufficient documentation

## 2010-12-21 DIAGNOSIS — K219 Gastro-esophageal reflux disease without esophagitis: Secondary | ICD-10-CM | POA: Insufficient documentation

## 2010-12-21 DIAGNOSIS — J449 Chronic obstructive pulmonary disease, unspecified: Secondary | ICD-10-CM | POA: Insufficient documentation

## 2010-12-21 DIAGNOSIS — G8929 Other chronic pain: Secondary | ICD-10-CM | POA: Insufficient documentation

## 2010-12-21 DIAGNOSIS — I714 Abdominal aortic aneurysm, without rupture, unspecified: Secondary | ICD-10-CM | POA: Insufficient documentation

## 2010-12-21 LAB — URINALYSIS, ROUTINE W REFLEX MICROSCOPIC
Bilirubin Urine: NEGATIVE
Glucose, UA: NEGATIVE mg/dL
Hgb urine dipstick: NEGATIVE
Ketones, ur: NEGATIVE mg/dL
Protein, ur: NEGATIVE mg/dL

## 2010-12-21 LAB — DIFFERENTIAL
Basophils Absolute: 0.1 10*3/uL (ref 0.0–0.1)
Basophils Relative: 1 % (ref 0–1)
Eosinophils Absolute: 0.1 10*3/uL (ref 0.0–0.7)
Eosinophils Relative: 2 % (ref 0–5)
Lymphocytes Relative: 22 % (ref 12–46)
Lymphs Abs: 1.8 10*3/uL (ref 0.7–4.0)
Monocytes Absolute: 0.8 10*3/uL (ref 0.1–1.0)
Monocytes Relative: 10 % (ref 3–12)
Neutro Abs: 5.1 10*3/uL (ref 1.7–7.7)
Neutrophils Relative %: 65 % (ref 43–77)

## 2010-12-21 LAB — COMPREHENSIVE METABOLIC PANEL
Alkaline Phosphatase: 72 U/L (ref 39–117)
BUN: 13 mg/dL (ref 6–23)
Calcium: 9.3 mg/dL (ref 8.4–10.5)
Creatinine, Ser: 0.9 mg/dL (ref 0.4–1.5)
Glucose, Bld: 83 mg/dL (ref 70–99)
Total Protein: 7 g/dL (ref 6.0–8.3)

## 2010-12-21 LAB — CBC
HCT: 41.6 % (ref 39.0–52.0)
Hemoglobin: 14.2 g/dL (ref 13.0–17.0)
MCH: 28.5 pg (ref 26.0–34.0)
MCHC: 34.1 g/dL (ref 30.0–36.0)
MCV: 83.4 fL (ref 78.0–100.0)
Platelets: 176 10*3/uL (ref 150–400)
RBC: 4.99 MIL/uL (ref 4.22–5.81)
RDW: 15.2 % (ref 11.5–15.5)
WBC: 7.9 10*3/uL (ref 4.0–10.5)

## 2010-12-21 MED ORDER — IOHEXOL 300 MG/ML  SOLN
100.0000 mL | Freq: Once | INTRAMUSCULAR | Status: AC | PRN
  Administered 2010-12-21: 100 mL via INTRAVENOUS

## 2010-12-21 NOTE — Progress Notes (Signed)
Subjective:    Patient ID: Andrew [name], male    DOB: 1940/11/12, 70 y.o.   MRN: [id number]  HPI 70 year old white male with history of type 2 diabetes, hypertension, and AAA for routine followup. Patient has multiple complaints. Pt c/o intermittent dizziness x 2 weeks.  His symptoms are worse with change in position and turning his head. Patient also describes nonspecific abdominal bloating and discomfort.  He feels like his abdomen is much more distended especially at the flanks. He has hx of chronic etoh use.  Stopped etoh in 2005.     DM II - blood sugar stable.  AAA - previously seen by vascular specialists.  Last CT noted size of 4.6 cm.   At previous vascular consultation,  they recommended observation.  Review of Systems  Patient complains of dyspnea on exertion.  He denies chest pain Past Medical History  Diagnosis Date  . Hypertension   . COPD (chronic obstructive pulmonary disease)   . Arthritis   . Asthma   . GERD (gastroesophageal reflux disease)   . A-fib   . CAD (coronary artery disease)   . Palpitations   . Abdominal pain   . Decreased libido   . Fatigue   . Prostatitis, acute   . Hypertrophy of prostate with urinary obstruction and other lower urinary tract symptoms (LUTS)   . Adenomatous colon polyp   . Diverticulosis of colon   . Esophageal stricture   . Cataract     bilateral  . History of small bowel obstruction   . Hyperlipidemia   . Chronic LBP   . Peri-rectal abscess   . Tobacco abuse   . Personal history of renal calculi   . Cancer     skin, nose    History   Social History  . Marital Status: Divorced    Spouse Name: N/A    Number of Children: N/A  . Years of Education: N/A   Occupational History  . Retired    Social History Main Topics  . Smoking status: Current Everyday Smoker  . Smokeless tobacco: Not on file  . Alcohol Use: No  . Drug Use:   . Sexually Active:    Other Topics Concern  . Not on file   Social History  Narrative  . No narrative on file    Past Surgical History  Procedure Date  . Lumbar laminectomy     with Harrington rods  . Hernia repair   . Tonsillectomy   . Cataract extraction 04/2010    bilateral  . Penile prosthesis placement     Family History  Problem Relation Age of Onset  . Heart disease Brother     CAD  . Diabetes Brother   . COPD Brother   . Emphysema Brother     Allergies  Allergen Reactions  . Atorvastatin     REACTION: Heartburn  . Procaine Hcl     Current Outpatient Prescriptions on File Prior to Visit  Medication Sig Dispense Refill  . diltiazem (CARDIZEM CD) 300 MG 24 hr capsule Take 300 mg by mouth daily.        Marland Kitchen esomeprazole (NEXIUM) 40 MG capsule Take 40 mg by mouth daily.        . Fluticasone-Salmeterol (ADVAIR DISKUS) 500-50 MCG/DOSE AEPB Inhale 2 puffs into the lungs 2 (two) times daily.        Marland Kitchen glucose blood (FREESTYLE LITE) test strip Use to test blood sugar once a day. Dx 250.00       .  ipratropium-albuterol (DUONEB) 0.5-2.5 (3) MG/3ML SOLN Take 3 mLs by nebulization 4 (four) times daily as needed.        . Lancets (FREESTYLE) lancets Use to test blood sugar once a day as instructed Dx 250.00       . losartan (COZAAR) 25 MG tablet Take 25 mg by mouth daily.        . rosuvastatin (CRESTOR) 20 MG tablet Take 20 mg by mouth daily.          BP 124/70  Pulse 67  Temp(Src) 97.9 F (36.6 C) (Oral)  Resp 20  Ht 5\' 5"  (1.651 m)  Wt 172 lb (78.019 kg)  BMI 28.62 kg/m2  SpO2 95%       Objective:   Physical Exam  Constitutional: He is oriented to person, place, and time. He appears well-developed and well-nourished.  HENT:  Head: Normocephalic and atraumatic.  Mouth/Throat: Oropharynx is clear and moist.  Cardiovascular: Normal rate.  Exam reveals no gallop and no friction rub.        Heart sounds are somewhat distant  Pulmonary/Chest: Effort normal and breath sounds normal. No respiratory distress.  Abdominal: Soft. He exhibits  distension. He exhibits no mass. There is no tenderness.  Musculoskeletal: He exhibits no edema.  Neurological: He is oriented to person, place, and time. He has normal reflexes. No cranial nerve deficit. Coordination normal.  Skin: Skin is warm and dry. He is not diaphoretic.  Psychiatric: He has a normal mood and affect. His behavior is normal.          Assessment & Plan:

## 2010-12-22 ENCOUNTER — Telehealth: Payer: Self-pay | Admitting: Internal Medicine

## 2010-12-22 NOTE — Telephone Encounter (Signed)
Pt states that he saw dr. Artist Pais yesterday and that dr. Artist Pais told him that he has vertigo. Pt states that dr. Artist Pais said that he would give him med to help with the spinning feeling. Pt not sure if any meds were called in to pharmacy. If not, pt would like med to be called into pharmacy. Pt states he does not know the name of the med that dr. Artist Pais suggested.   [facility] [address]

## 2010-12-23 NOTE — Telephone Encounter (Signed)
Advised pt per Dr Olegario Messier instruction. F/u scheduled for 01/03/11 at 3:15. Pt aware that he will be contacted about his echo. Dr Artist Pais, Myriam Jacobson never received the referral for this procedure. She states that these referrals are not coming over into her work que. Myriam Jacobson said she gave you a list of the procedures that are not flowing to her. Can you resend the order?

## 2010-12-23 NOTE — Telephone Encounter (Signed)
Pt returned my call and stated he thought I was referring to the ultrasound to check his aneurysm when we talked previously. Advised pt we will cancel appt with Dr Early and proceed with echocardiogram.

## 2010-12-23 NOTE — Telephone Encounter (Signed)
Please advise 

## 2010-12-23 NOTE — Telephone Encounter (Signed)
Please advise pt to stop taking losartan.  Dizziness likely coming from orthostatic hypotension.  I do not think vertigo medication will help. Pt should f/u within 1 week.  (it would be helpful if 2D echo was completed before OV)

## 2010-12-23 NOTE — Telephone Encounter (Signed)
Received call from Dr Bosie Helper office stating that pt called them to schedule an ultrasound on his AAA. He told them that Dr Artist Pais wanted him to have it checked again. Left message for pt to return my call and clarify when he was told this.

## 2010-12-24 NOTE — Telephone Encounter (Signed)
Andrew [name] is aware and will proceed with echo referral and cancel Dr Bosie Helper appt.

## 2010-12-26 DIAGNOSIS — R42 Dizziness and giddiness: Secondary | ICD-10-CM | POA: Insufficient documentation

## 2010-12-26 NOTE — Assessment & Plan Note (Signed)
Patient with orthostatic hypotension. Discontinue valsartan. Obtain 2-D echocardiogram to rule out pericardial effusion considering muffled heart sounds.  Patient advised to seek emergency care if symptoms persist or worsen.

## 2010-12-26 NOTE — Assessment & Plan Note (Signed)
Stable.  AM blood sugars reported normal

## 2010-12-26 NOTE — Assessment & Plan Note (Signed)
69 year old white male with history of AAA complains of vague abdominal discomfort and bloating. Symptoms concerning for slow leak of abdominal aneurysm. Patient referred to [facility] emergency room for urgent CT scan of abdomen and pelvis.

## 2010-12-28 ENCOUNTER — Telehealth (INDEPENDENT_AMBULATORY_CARE_PROVIDER_SITE_OTHER): Payer: Medicare Other | Admitting: Internal Medicine

## 2010-12-28 DIAGNOSIS — K219 Gastro-esophageal reflux disease without esophagitis: Secondary | ICD-10-CM

## 2010-12-28 DIAGNOSIS — E785 Hyperlipidemia, unspecified: Secondary | ICD-10-CM

## 2010-12-28 NOTE — Telephone Encounter (Signed)
Refill-nexium dr 40mg  capsule. Take 1 capsule by mouth once daily. Qty 30. Last fill 3.8.12  Refill- crestor 20mg  tablet. Take 1 tablet by mouth once daily. Qty 30. Last fill 3.8.12

## 2010-12-29 MED ORDER — ROSUVASTATIN CALCIUM 20 MG PO TABS: 20.0000 mg | ORAL_TABLET | Freq: Every day | ORAL | Status: DC

## 2010-12-29 MED ORDER — ESOMEPRAZOLE MAGNESIUM 40 MG PO CPDR: 40.0000 mg | DELAYED_RELEASE_CAPSULE | Freq: Every day | ORAL | Status: DC

## 2010-12-29 NOTE — Telephone Encounter (Signed)
Rx refill sent to pharmacy. 

## 2011-01-03 ENCOUNTER — Ambulatory Visit: Payer: Medicare Other | Admitting: Internal Medicine

## 2011-01-03 ENCOUNTER — Ambulatory Visit (HOSPITAL_COMMUNITY): Payer: Medicare Other | Attending: Internal Medicine

## 2011-01-03 DIAGNOSIS — I951 Orthostatic hypotension: Secondary | ICD-10-CM | POA: Insufficient documentation

## 2011-01-03 DIAGNOSIS — I319 Disease of pericardium, unspecified: Secondary | ICD-10-CM

## 2011-01-04 ENCOUNTER — Ambulatory Visit: Payer: Medicare Other | Admitting: Vascular Surgery

## 2011-01-04 ENCOUNTER — Encounter: Payer: Self-pay | Admitting: Internal Medicine

## 2011-01-04 ENCOUNTER — Ambulatory Visit (INDEPENDENT_AMBULATORY_CARE_PROVIDER_SITE_OTHER): Payer: Medicare Other | Admitting: Internal Medicine

## 2011-01-04 VITALS — BP 132/70 | HR 69 | Temp 98.1°F | Resp 18 | Wt 172.0 lb

## 2011-01-04 DIAGNOSIS — I714 Abdominal aortic aneurysm, without rupture, unspecified: Secondary | ICD-10-CM

## 2011-01-04 DIAGNOSIS — K5909 Other constipation: Secondary | ICD-10-CM

## 2011-01-04 DIAGNOSIS — R109 Unspecified abdominal pain: Secondary | ICD-10-CM

## 2011-01-04 NOTE — Patient Instructions (Signed)
Please follow up with Dr. Arbie Cookey re:  AAA Use over the counter miralax daily Use citracel (bulk laxative) once daily

## 2011-01-04 NOTE — Progress Notes (Signed)
Subjective:    Patient ID: Andrew [name], male    DOB: 06/05/1941, 70 y.o.   MRN: [id number]  HPI  70 year old white male for emergency room followup. Patient previously seen due to abdominal distention and dizziness. There was concern for possible leaking or enlargement of abdominal aortic aneurysm. CT of abdomen and pelvis showed minimal increase in size of AAA from 4.6 cm to 4.8 cm.    Patient's dizziness symptoms have significantly improved since discontinuing losartan.  He still has intermittent vertigo symptoms when turning his head suddenly while getting out of bed in the morning.  Patient still complains of abdominal fullness and bloating sensation.  Patient reports mild chronic constipation.  We reviewed results of recent echocardiogram. Patient noted to have normal ejection fraction.  He has mild diastolic dysfunction. No pericardial effusion noted.  Review of Systems No shortness of breath or chest pain    Past Medical History  Diagnosis Date  . Hypertension   . COPD (chronic obstructive pulmonary disease)   . Arthritis   . Asthma   . GERD (gastroesophageal reflux disease)   . A-fib   . CAD (coronary artery disease)   . Palpitations   . Abdominal pain   . Decreased libido   . Fatigue   . Prostatitis, acute   . Hypertrophy of prostate with urinary obstruction and other lower urinary tract symptoms (LUTS)   . Adenomatous colon polyp   . Diverticulosis of colon   . Esophageal stricture   . Cataract     bilateral  . History of small bowel obstruction   . Hyperlipidemia   . Chronic LBP   . Peri-rectal abscess   . Tobacco abuse   . Personal history of renal calculi   . Cancer     skin, nose    History   Social History  . Marital Status: Divorced    Spouse Name: N/A    Number of Children: N/A  . Years of Education: N/A   Occupational History  . Retired    Social History Main Topics  . Smoking status: Current Everyday Smoker  . Smokeless tobacco: Not on  file  . Alcohol Use: No  . Drug Use:   . Sexually Active:    Other Topics Concern  . Not on file   Social History Narrative  . No narrative on file    Past Surgical History  Procedure Date  . Lumbar laminectomy     with Harrington rods  . Hernia repair   . Tonsillectomy   . Cataract extraction 04/2010    bilateral  . Penile prosthesis placement     Family History  Problem Relation Age of Onset  . Heart disease Brother     CAD  . Diabetes Brother   . COPD Brother   . Emphysema Brother     Allergies  Allergen Reactions  . Atorvastatin     REACTION: Heartburn  . Procaine Hcl     Current Outpatient Prescriptions on File Prior to Visit  Medication Sig Dispense Refill  . diltiazem (CARDIZEM CD) 300 MG 24 hr capsule Take 300 mg by mouth daily.        Marland Kitchen esomeprazole (NEXIUM) 40 MG capsule Take 1 capsule (40 mg total) by mouth daily.  30 capsule  3  . Fluticasone-Salmeterol (ADVAIR DISKUS) 500-50 MCG/DOSE AEPB Inhale 2 puffs into the lungs 2 (two) times daily.        Marland Kitchen glucose blood (FREESTYLE LITE) test strip Use to test  blood sugar once a day. Dx 250.00       . Lancets (FREESTYLE) lancets Use to test blood sugar once a day as instructed Dx 250.00       . rosuvastatin (CRESTOR) 20 MG tablet Take 1 tablet (20 mg total) by mouth daily.  30 tablet  3  . ipratropium-albuterol (DUONEB) 0.5-2.5 (3) MG/3ML SOLN Take 3 mLs by nebulization 4 (four) times daily as needed.        Marland Kitchen losartan (COZAAR) 25 MG tablet Take 25 mg by mouth daily.          BP 132/70  Pulse 69  Temp(Src) 98.1 F (36.7 C) (Oral)  Resp 18  Wt 172 lb (78.019 kg)  SpO2 97%    Past Medical History  Diagnosis Date  . Hypertension   . COPD (chronic obstructive pulmonary disease)   . Arthritis   . Asthma   . GERD (gastroesophageal reflux disease)   . A-fib   . CAD (coronary artery disease)   . Palpitations   . Abdominal pain   . Decreased libido   . Fatigue   . Prostatitis, acute   . Hypertrophy  of prostate with urinary obstruction and other lower urinary tract symptoms (LUTS)   . Adenomatous colon polyp   . Diverticulosis of colon   . Esophageal stricture   . Cataract     bilateral  . History of small bowel obstruction   . Hyperlipidemia   . Chronic LBP   . Peri-rectal abscess   . Tobacco abuse   . Personal history of renal calculi   . Cancer     skin, nose    History   Social History  . Marital Status: Divorced    Spouse Name: N/A    Number of Children: N/A  . Years of Education: N/A   Occupational History  . Retired    Social History Main Topics  . Smoking status: Current Everyday Smoker  . Smokeless tobacco: Not on file  . Alcohol Use: No  . Drug Use:   . Sexually Active:    Other Topics Concern  . Not on file   Social History Narrative  . No narrative on file    Past Surgical History  Procedure Date  . Lumbar laminectomy     with Harrington rods  . Hernia repair   . Tonsillectomy   . Cataract extraction 04/2010    bilateral  . Penile prosthesis placement     Family History  Problem Relation Age of Onset  . Heart disease Brother     CAD  . Diabetes Brother   . COPD Brother   . Emphysema Brother     Allergies  Allergen Reactions  . Atorvastatin     REACTION: Heartburn  . Procaine Hcl     Current Outpatient Prescriptions on File Prior to Visit  Medication Sig Dispense Refill  . diltiazem (CARDIZEM CD) 300 MG 24 hr capsule Take 300 mg by mouth daily.        Marland Kitchen esomeprazole (NEXIUM) 40 MG capsule Take 1 capsule (40 mg total) by mouth daily.  30 capsule  3  . Fluticasone-Salmeterol (ADVAIR DISKUS) 500-50 MCG/DOSE AEPB Inhale 2 puffs into the lungs 2 (two) times daily.        Marland Kitchen glucose blood (FREESTYLE LITE) test strip Use to test blood sugar once a day. Dx 250.00       . Lancets (FREESTYLE) lancets Use to test blood sugar once a day as instructed  Dx 250.00       . rosuvastatin (CRESTOR) 20 MG tablet Take 1 tablet (20 mg total) by mouth  daily.  30 tablet  3  . ipratropium-albuterol (DUONEB) 0.5-2.5 (3) MG/3ML SOLN Take 3 mLs by nebulization 4 (four) times daily as needed.        Marland Kitchen losartan (COZAAR) 25 MG tablet Take 25 mg by mouth daily.          BP 132/70  Pulse 69  Temp(Src) 98.1 F (36.7 C) (Oral)  Resp 18  Wt 172 lb (78.019 kg)  SpO2 97%    Objective:   Physical Exam  Constitutional: He appears well-developed and well-nourished. No distress.  Cardiovascular: Normal rate, regular rhythm and normal heart sounds.  Exam reveals no gallop and no friction rub.   Pulmonary/Chest: Breath sounds normal. He has no wheezes. He has no rales.  Abdominal: Soft. Bowel sounds are normal. He exhibits distension. There is no tenderness.  Skin: Skin is warm and dry.  Psychiatric: He has a normal mood and affect. His behavior is normal.          Assessment & Plan:

## 2011-01-04 NOTE — Assessment & Plan Note (Signed)
I doubt patient's abdominal symptoms coming from his AAA. Patient complains of chronic constipation and he has colon distention on recent CT of abdomen and pelvis. I suggest regular use of MiraLax, stool softeners and bulk laxatives.

## 2011-01-04 NOTE — Assessment & Plan Note (Signed)
Followup CT scan 12/21/2010 showed maximum axial measurements of 4.8 x 4.4 cm (image 35), with previous measurements of 4.6 x 4.3 cm on that examination and 3.6 x 3.6 cm on an examination in January, 2010. Patient is planning on moving to [place] to be closer to his daughter. I suggested followup with vascular specialist before his move.
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
## 2011-01-18 NOTE — H&P (Signed)
Andrew [name], Andrew [name]                  ACCOUNT NO.:  [id number]   MEDICAL RECORD NO.:  [id number]          PATIENT TYPE:  INP   LOCATION:  0102                         FACILITY:  [facility]   PHYSICIAN:  Hollice Espy, M.D.DATE OF BIRTH:  02/20/1941   DATE OF ADMISSION:  08/20/2007  DATE OF DISCHARGE:                              HISTORY & PHYSICAL   PRIMARY CARE PHYSICIAN:  Barbette Hair. Artist Pais, DO.   CHIEF COMPLAINT:  Abdominal pain.   HISTORY OF PRESENT ILLNESS:  The patient is a 70 year old white male  with a past medical history of hiatal hernia repair, tobacco,  hypertension, and GERD, who for the past 24 hours had problems with  severe abdominal pain, generalized and nausea and vomiting, unable to  keep anything down.  He came into the emergency room for further  evaluation.  The patient had an acute abdominal series followed by a CT  abdomen and pelvis which confirmed a partial small bowel obstruction.  The rest of his labs are unremarkable.  He was given medications for  pain and nausea which helped somewhat but he still complains of some  discomfort.  He otherwise is doing well.   REVIEW OF SYSTEMS:  He denies any headaches, visual changes, dysphagia.  He complains of mild nausea as well as acid reflux.  No chest pain or  palpitations.  No shortness of breath, wheezing, or coughing.  He does  complain of abdominal pain and distention.  No hematuria or dysuria.  No  constipation or diarrhea.  No focal extremity numbness, weakness, or  pain.  Review of systems otherwise negative.  The patient would like to  go out for a smoke.   PAST MEDICAL HISTORY:  1. Tobacco abuse.  2. GERD.  3. History of hiatal hernia repair.  4. History of a penile implant.   MEDICATIONS:  He is on:  1. Procardia 240 daily.  2. Nexium 40 p.o. daily.  3. Ranitidine 3 times a day p.r.n.  4. Nitrostat 0.4 sublingual p.r.n.   ALLERGIES:  NOVOCAIN.   SOCIAL HISTORY:  He smokes about a pack a day.  Denies  alcohol or drug  use.   FAMILY HISTORY:  Noncontributory.   PHYSICAL EXAMINATION:  VITAL SIGNS:  On admission, temp 98, heart rate  99, blood pressure 162/103, respirations 22, O2 sat 92% on room air.  GENERAL:  He is alert and oriented x3, in no apparent distress.  HEENT:  Normocephalic atraumatic.  His mucous membranes are slightly  dry.  He has no carotid bruits.  HEART:  Regular rate and rhythm.  S1 S2.  LUNGS:  He has mild bilateral expiratory wheezing throughout.  ABDOMEN:  Soft but distended.  High pitched bowel noises.  Some  generalized tenderness.  EXTREMITIES:  Showed no clubbing, cyanosis, or edema.   LABORATORY:  White count 6.9, H&H 16.5 and 48, MCV 82, platelet count  203, 79% shift.  Sodium 140, potassium 3.8, chloride 102, bicarb 29, BUN  15, creatinine 1, glucose 172.  LFTs unremarkable.  Lipase normal at 19.  UA unremarkable.  CT of  the abdomen and pelvis plus abdominal x-ray as  per HPI.   ASSESSMENT/PLAN:  1. Partial small bowel obstruction.  I discussed with the patient we      will make him nothing by mouth, intravenous Protonix, plus pain and      nausea control.  Repeat abdominal series in the morning to see if      it has resolved.  2. Tobacco abuse.  Provide nicotine patch.  3. Hypertension.  Holding oral medications, intravenous Lopressor as      needed.  4. Decreased oxygen saturation with wheezing, likely some response to      severe gastroesophageal reflux disease/chronic tobacco use.  We      will put him on an as needed albuterol inhaler.      Hollice Espy, M.D.  Electronically Signed     SKK/MEDQ  D:  08/20/2007  T:  08/20/2007  Job:  [id number]   cc:   Barbette Hair. [place], DO  [address] [place], [place] [address]

## 2011-01-18 NOTE — Assessment & Plan Note (Signed)
Andrew [name]                         ELECTROPHYSIOLOGY OFFICE NOTE   LAMOND, GLANTZ                         MRN:          [id number]  DATE:09/11/2008                            DOB:          01-Nov-1940    Andrew [name] is seen in followup for palpitations, which have been  quiescent on his combination of Cardizem and Benicar.  He has  longstanding blood pressure issues for which the Benicar was added.  The  patient ran out of his medications a week or so ago.  He comes in today  having taking only his Advair, not taking his Cardia, Zegerid, or  Benicar.   On examination, his blood pressure was 118/76 with a pulse of 73.  I  should note that in April 2009 it was 151.  Lungs were good breath  sounds but there was wheezing bilaterally.  Neck veins were flat.  Heart  sounds were regular.  The abdomen was soft.  The extremities had no  edema.   IMPRESSION:  1. 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  2. Hypertension - doing pretty well.  3. Chronic obstructive pulmonary disease.  4. Potentially moving to [place].   Andrew [name] is stable from a palpitation point of view.  We will continue  on his Cardizem.  Given his blood pressure being 118 off medications, I  have elected not to refill his Benicar.  I should note that he is seeing  Dr. Artist Pais tomorrow and I will defer further management of his blood  pressure to Dr. Artist Pais, but refilled the medications as above.   I have also suggested Andrew [name] in anticipation of his moving to [place]  that he should sign a medical release form, so that his records can be  transferred when that happens.     Duke Salvia, MD, [facility]  Electronically Signed    SCK/MedQ  DD: 09/11/2008  DT: 09/11/2008  Job #: [id number]
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
## 2011-01-18 NOTE — Assessment & Plan Note (Signed)
[facility]                            CARDIOLOGY OFFICE NOTE   ROLLEN, SELDERS                         MRN:          [id number]  DATE:10/03/2007                            DOB:          October 01, 1940    Ante returns today for follow-up.  He has previously been seen by Dr.  __________  and I believe he had a normal cath back in 2004.   He has had an isolated episode of PAF.   He has a bit of forgetfulness and is not thought to be a good Coumadin  candidate.   Believe back in February 2007 he had a normal Myoview and an echo  without significant structural heart disease.   His EF was 55-60%.  He had very mild aortic stenosis with a mean  gradient of 8 mmHg.   His he did have some left atrial enlargement at 38 mm.   The patient complains of recurrent palpitations.  They tend to occur  when he is hungry.  They last 10-15 minutes.  He may get them of three  to four times per month.   Sometimes he says his heart rate gets so fast he has chest pain and he  needs to take nitro.  I am not sure who gave him the nitroglycerin.  I  have never prescribed it for him.  It may have been Dr. Artist Pais.  In regards  to his risk factors, he has recently been seen by Dr. Artist Pais and started on  back on for high blood pressure.  Do not have recent lipids on him.  He  is a nondiabetic.  Family history is negative.  He continues to smoke a  pack a day.   His review of systems is otherwise remarkable for increasing back  problems otherwise negative.  His meds include Cartia 300 a day, Zegerid  40 a day, Benicar 20 a day, Advair Diskus.   EXAM:  Remarkable for somewhat forgetful elderly white male in no  distress.  Affect is appropriate.  Weight is 175, blood pressure is 138/79.  He is only taking one dose of  Benicar.  Pulse 68 regular, afebrile.  HEENT:  Unremarkable.  Carotids are without bruit, no lymphadenopathy, thyromegaly, JVP  elevation.  LUNGS:  Clear  diaphragmatic motion.  No wheezing.  S1-S2 with a mild systolic ejection murmur. Second heart sound is  preserved.  No AI.  PMI normal.  ABDOMEN:  Benign.  Bowel sounds positive.  No hepatosplenomegaly or  hepatojugular reflux.  No tenderness, no bruit.  Distal pulse intact, no edema.  NEURO:  Nonfocal.  SKIN:  Warm and dry.   EKG normal.   IMPRESSION:  1. Recurrent palpitations in a patient with history of paroxysmal      atrial fibrillation, 30-day event monitor to document any      recurrences.  I prefer not to have him have a beta blocker added at      this time since his resting heart rate is in the 60s.  At some      point also a  little bit hesitant to given beta blockers given his      COPD.  He will continue aspirin for the time being.  2. Hypertension being treated by Dr. Artist Pais.  Benicar added.  Continue      low-salt diet.  Follow-up 6 to 8 weeks.  3. Chronic obstructive pulmonary disease.  The patient not motivated      to stop smoking.  Follow-up PFTs probably in 6 months.  Continue      Advair.  4. History of gastroesophageal reflux disease.  Looking back through      the notes it appears that he has had some esophageal problems as      well.  He had a distal esophageal stricture dilated in April 2008.      He needs to follow up with Dr. Arlyce Dice. He will continue Zegerid.   I will see the patient back in 4 to 6 weeks to reassess his rhythm and  also his blood pressures since the Benicar started.  Since he is having  chest pain with these rapid palpitations, he will have a stress Myoview  to rule out coronary artery disease.     [name]. Eden Emms, MD, [facility]  Electronically Signed    PCN/MedQ  DD: 10/03/2007  DT: 10/03/2007  Job #: [id number]

## 2011-01-18 NOTE — Assessment & Plan Note (Signed)
[facility]                         GASTROENTEROLOGY OFFICE NOTE   RITESH, OPARA                         MRN:          [id number]  DATE:09/25/2007                            DOB:          1941-06-18    PROBLEM:  Small bowel obstruction.   Mr. Zaring has returned following hospitalization in December for a  partial small bowel obstruction.  This was felt secondary to adhesions.  It resolved spontaneously.  He has GERD, which is well controlled with  Zegerid.  He is taking Zegerid in the mornings, but does have some  nocturnal breakthrough.   EXAMINATION:  Pulse 74.  Blood pressure 130/68.  Weight 171.   IMPRESSION:  1. Small bowel obstruction, resolved.  2. Gastroesophageal reflux disease.  At this point, he has primarily      nocturnal gastroesophageal reflux disease.   RECOMMENDATIONS:  Change Zegerid to nightly.     Barbette Hair. Arlyce Dice, MD,FACG  Electronically Signed    RDK/MedQ  DD: 09/25/2007  DT: 09/25/2007  Job #: [id number]

## 2011-01-18 NOTE — Discharge Summary (Signed)
Andrew [name], Andrew [name]                  ACCOUNT NO.:  [id number]   MEDICAL RECORD NO.:  [id number]          PATIENT TYPE:  INP   LOCATION:  1333                         FACILITY:  [facility]   PHYSICIAN:  Willow Ora, MD           DATE OF BIRTH:  May 19, 1941   DATE OF ADMISSION:  08/20/2007  DATE OF DISCHARGE:  08/24/2007                               DISCHARGE SUMMARY   PRIMARY CARE PHYSICIAN:  Barbette Hair. Artist Pais, DO at [facility]   BRIEF HISTORY/PHYSICAL:  The patient is a 70 year old white male with  past medical history of hiatal hernia repair, tobacco abuse,  hypertension and GERD who was admitted to the hospital with severe  abdominal pain, generalized along with nausea and vomiting.   PHYSICAL EXAMINATION:  VITAL SIGNS:  Temperature was 98, heart rate 99,  blood pressure 162/103, O2 sat 92% on room air.  LUNGS:  He has mild bilaterally expiratory wheezing.  ABDOMEN:  Soft, but distended, high-pitched bowel sounds and some  generalized tenderness.   LABORATORY DATA:  Upon admission, his white count was 6.9, hemoglobin  816.5, platelets of 203, potassium 3.8, creatinine 1.0, blood sugar 177.  LFTs were normal.  Lipase was normal.  Calcium was 8.0.   DIAGNOSTICS:  1. Initial abdominal film was consistent with early or partial SBO.  2. CT of the abdomen and pelvis showed a small bowel obstruction      likely related to adhesions as well as infraabdominal aortic      aneurysm.   HOSPITAL COURSE:  The patient was admitted to the hospital, started on  IV fluids and was placed n.p.o.  He was treated with morphine, Protonix  and Zofran.  GI was consulted and they noted that he had a colonoscopy  and EGD performed in September 2008, by Dr. Arlyce Dice.  They suspected that  he had SBO due to adhesions and his GERD was worsened by the nausea and  vomiting.  They recommended nasogastric tube and a high-dose IV PPIs.  The patient continually improved.  At the time of discharge, he is  tolerating  well his diet.  He has been put on p.o. PPIs.  At this point,  it is felt that he got maximal hospital benefit and he will be  discharged with the following instructions:   DISCHARGE INSTRUCTIONS:  1. Resume Cartia/Diltiazem 240 mg once a day.  2. Either take Nexium twice a day or Zegerid twice a day.      Prescriptions for both medicines were provided.  3. Continue with Nitrostat as before.  4. He is advised to call his primary care doctor if the symptoms come      back.  5. See Dr. Arlyce Dice on September 25, 2007 at10:30 a.m.  6. Diet regular per GI recommendation.      Willow Ora, MD  Electronically Signed     JP/MEDQ  D:  08/24/2007  T:  08/25/2007  Job:  [id number]   cc:   Barbette Hair. Gassaway, DO  [address] [place], [place] [address]

## 2011-01-18 NOTE — Letter (Signed)
December 07, 2007    [name]. Eden Emms, M.D.  [address]  [address]  [place], [place] [address]   RE:  Andrew [name], Andrew [name]  MRN:  [id number]  /  DOB:  1941/01/11   Dear Andrew [name]:   It was a pleasure to see Andrew [name] today at your request because of  palpitations.   As you know, he is a 70 year old, disabled, Caucasian gentleman who  underwent  a catheterization for chest pain evaluation about five years  ago that was apparently normal.  He carries a diagnosis of question  atrial fibrillation but was not treated with Coumadin.  Over the last  three to four years, he has had problems with recurrent palpitations.  Over the last number of months, they have become increasingly frequent,  and he has had about 5 or 10 episodes per year.  They typically last  about 30 seconds to 5 or 10 minutes.  They are unassociated with  shortness of breath or chest discomfort.  They are associated  diaphoresis and profound presyncope but no syncope.   You obtained an event recorder which is described below.   He also undertook Myoview scan which demonstrated normal left  ventricular function and nonischemic perfusion.   His past cardiac history is notable for hypertension and cigarette  abuse.  He does not have diabetes.  He does not use alcohol or  recreational drugs.   He has no peripheral edema.  He denies dyspnea on exertion.   His past medical history is notable for GE reflux disease and peptic  ulcer disease as well as COPD.   His past surgical history is notable for back surgery, hernia surgery,  tonsillectomy, and colonic obstruction.   His current medications include:  1. Cartia 300  2. Zegerid 40.  3. Benicar 20.  4. Advair.   HE IS ALLERGIC TO NOVOCAIN, CAUSING ANAPHYLAXIS, IS OTHERWISE NEGATIVE.   His review of systems is not contributory.   PHYSICAL EXAMINATION:  He is an older, Caucasian male sounding and  appearing older than his stated age of 31.  His blood pressure is 151/80  with a  pulse of 70.  His weight was 174.  His HEENT exam demonstrated no icterus, no xanthoma.  His neck veins were flat.  The carotids are brisk and full bilaterally  without bruits.  The back was without kyphosis, scoliosis.  The lungs were clear.  Heart sounds were regular without murmurs or gallops.  The abdomen was soft with active bowel sounds without midline pulsation  or hepatomegaly.  Femoral pulses were 2+.  Distal pulses were intact.  There was no  clubbing, cyanosis or edema.  The neurological exam was grossly normal.  His skin was warm and dry.   Electrocardiogram from when you saw him in January 28 was notable for  sinus rhythm at 60 with intervals of 0.12/0.09/0.41.  There was no  evidence of ventricular pre-excitation.   The telemetries are difficult to interpret.  There is clearly a narrow  QRS tachycardia with which I do not identify a discernible antecedent P-  wave.  However, on the third line of the strip dated February 27, there  is slowing of the tachycardia and the beginning of the description of a  P-wave in the terminal portion of T-wave suggesting that this is either  a sinus rhythm, sinus/atrial rhythm that is slowing, or a reentrant  rhythm that is terminating.  There is some slowing of the tachycardia  just prior  to the appearance of the P-waves.  Within about 15 seconds,  however, the heart rate is diminished to about 75 beats per minute.  This would support the idea of a reentrant arrhythmia.   IMPRESSION:  1. 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  2. No evidence of structural heart disease with a negative Myoview and      normal echocardiogram.  3. Dizzy spells unrelated to his arrhythmia.  4. Hypertension.  5. Peptic ulcer disease.  6. Back surgery with Harrington rods.   [name], Andrew [name], has supraventricular tachycardia that appears to be  reentrant but might be atrial or an inappropriate sinus  tachycardia.  He  currently is taking the Cardizem, and he would like to continue taking  medications for the time being as opposed to catheter ablation which we  discussed.  In the event that he has continued breakthrough on the  Cardizem, I would probably lower the dose back to 180 or 240 and add a  low-dose adjunctive beta blocker to see if the combination would not do  the trick.   I have asked that if he would be willing to come back and see me in  about 12 weeks' time to which he agreed.    Sincerely,      Duke Salvia, MD, [facility]  Electronically Signed    SCK/MedQ  DD: 12/07/2007  DT: 12/07/2007  Job #: [id number]   CC:    Barbette Hair. Artist Pais, DO

## 2011-01-18 NOTE — Assessment & Plan Note (Signed)
[facility]                         ELECTROPHYSIOLOGY OFFICE NOTE   KEMET, NIJJAR                         MRN:          [id number]  DATE:04/01/2008                            DOB:          May 09, 1941    Andrew [name] comes in today in followup for his palpitations.  They are  brief and well controlled.  Usually quite happy with how well he is  doing on his medications, which include the Cartia 300 and Benicar 20  which he takes religiously.   On examination, his blood pressure was 122/69 and his pulse was 63.  His  breath sounds were decreased.  His heart sounds were regular.  Extremities were without edema.  His neurological exam was grossly  normal.  He smells of cigarettes.   Electrocardiogram dated today demonstrated sinus rhythm at 64 with  intervals of 0.12/0.19/0.41.  Axis was 75 degrees.  There is minor ST-  segment scooping.  Otherwise, ECG was normal.   IMPRESSION:  1. Recurrent tachy palpitations, thought to be atrioventricular      reentry versus possibly micro reentry.  2. Chronic obstructive pulmonary disease.  3. Ongoing cigarette use which we discussed, but he is persisting in.  4. Hypertension.  5. Harrington rods.   Andrew [name] is stable.  He would like to continue on his medical therapy  for his palpitations.  I am glad to encourage him in that as he uses  medications for his blood pressure anyway.  We will see him again at Dr.  Fabio Bering request.  Otherwise, he will follow up with Dr. Eden Emms and you  as previously scheduled.     Duke Salvia, MD, [facility]  Electronically Signed    SCK/MedQ  DD: 04/01/2008  DT: 04/02/2008  Job #: [id number]

## 2011-01-18 NOTE — Assessment & Plan Note (Signed)
[facility]                         GASTROENTEROLOGY OFFICE NOTE   SIGURD, PUGH                         MRN:          [id number]  DATE:05/14/2007                            DOB:          04-Oct-1940    PROBLEM:  1. Dysphagia.  2. Abdominal distention.   Ms. Astarita has returned for re-evaluation.  He underwent upper endoscopy  in April 2008 that demonstrated a distal esophageal stricture for which  he was dilated to 17 mm, duodenitis was also noted.  Mr. Radoncic still  complains of episodes of dysphagia characterized by food stopping  immediately upon swallowing.  He is not getting choked, per se, but  feels that he is unable to pass the bolus of food at times.  Dilatation  did not help.  This mostly occurs with solids but occasionally with  liquids.  He is also complaining of excess flatus and severe abdominal  distention.  With this distention he has abdominal discomfort.  He also  complains of pyrosis, especially at night time.   MEDICATIONS:  Include Nexium and Cartia.   EXAMINATION:  Pulse 60, blood pressure 140/80, weight 177.  ABDOMINAL:  He has laxity of the anterior abdominal wall midline versus  a reducible ventral wall hernia, there are no abdominal masses or  organomegaly.  There is no succussion wave or shifting dullness.  There  are no abdominal masses or organomegaly.   IMPRESSION:  1. Persistent dysphagia.  This cricopharyngeal achalasia and a      motility disorder ought to be ruled out.  2. Abdominal distention.  There is no frank ascites that I can      determine.  He denies change in bowel habits, per se, though      structural lesion in the colon ought to be ruled out in view of his      excess flatus.  3. Questionable ventral wall hernia.   RECOMMENDATIONS:  1. A trial of Zegerid 40 mg nightly.  2. Barium swallow.  3. Colonoscopy.     Barbette Hair. Arlyce Dice, MD,FACG  Electronically Signed    RDK/MedQ  DD: 05/14/2007   DT: 05/14/2007  Job #: [id number]

## 2011-01-21 NOTE — Assessment & Plan Note (Signed)
Andrew [name]                             PRIMARY CARE OFFICE NOTE   Andrew [name], Andrew [name]                         MRN:          [id number]  DATE:04/27/2006                            DOB:          [birth year]    CHIEF COMPLAINT:  New patient to practice.   REFERRED BY:  Dr. Eden Emms of [facility].   HISTORY OF PRESENT ILLNESS:  Patient is a 70 year old white male here to  establish primary care.  Patient  has not had a primary care physician in  the past.  His medical history is significant for paroxysmal atrial  fibrillation, long standing tobacco abuse.  He has family history of  coronary artery disease.  Was last seen by Dr. Eden Emms in August of 2007.  He  is known to have an echocardiogram with good LV function and non-ischemic  Myoview.  He has been stable on calcium channel blocker/Cartia and was  recommended not to be on Coumadin therapy since he was maintaining sinus  rhythm and has no structural heart disease.  Patient does not have known or  documented chronic obstructive lung disease but he has been a heavy smoker  for most of his life.  Patient states that he started smoking in his teenage  years and likely has greater than 60-pack year history.  He denies any  shortness of breath but does have some dyspnea with significant exertion.   He has tried to quit smoking in the past without long term success.   SURGERIES:  His only other surgery has been back surgery with a Harrington  Rod in 1986.  Apparently he suffered a fall and may have suffered pelvic  fracture and multiple disc fractures.  He is status post lumbar laminectomy.   PAST MEDICAL HISTORY SUMMARY:  1. Paroxysmal AFib.  2. Long-standing tobacco abuse.  3. History of lumbar laminectomy with a Harrington Rods.  4. Status post perirectal abscess.  5. Status post IND.  6. History of gastroesophageal reflux disease.   CURRENT MEDICATIONS:  1. Nexium 40 mg once a day.  2.  Metoclopramide 10 mg t.i.d. a.c.  3. __________.   ALLERGIES:  Allergies to medications include NOVOCAIN.   SOCIAL HISTORY:  Patient is divorced.  He is a retired Curator.  Has seven  children.   FAMILY HISTORY:  Mother deceased at age 22 secondary to MI/coronary artery  disease.  Father deceased at age 61 after being hospitalized and had some  sort of medication reaction.  Brother is known to be diabetic, also has  coronary artery disease, emphysema, and bronchitis.   REVIEW OF SYSTEMS:  No fevers, chills.  Patient has had a raspy voice for  the most of his life.  Denies any hoarseness.  No trouble swallowing.  No  chest pain.  Respiratory complaints as noted above.  Heartburn is  controlled.  Patient has multiple skin lesions seen on his face.  Seen a  dermatologist in the past.   PHYSICAL EXAMINATION:  VITAL SIGNS:  Weight is 172 pounds.  Temperature is  97.3.  Pulse is  75. Blood pressure is 124/79.  GENERAL:  The patient is a very pleasant 70 year old white male who appears  somewhat older than his stated age.  HEENT:  Normocephalic. And atraumatic.  Pupils are equal and reactive to  light bilaterally.  Extraocular muscles were intact.  Patient was anicteric.  Conjunctivae was within normal limits.  External auditory canals and  tympanic membranes were clear bilaterally.  Hearing was somewhat grossly  diminished. Oropharyngeal exam upper and lower dental plate.  No oral  pharyngeal lesions noted.  NECK:  Supple.  No adenopathy, carotid bruit or thyromegaly.  CHEST:  Normal inspiratory effort.  Patient had decreased breath sounds in  the bases.  Decreased respiratory excursion but no rales, rhonchi or  wheezing.  CARDIOVASCULAR:  Regular rate and rhythm.  No significant murmurs, rubs or  gallops appreciated.  ABDOMEN:  Soft. Non-tender.  Positive bowel sounds.  No organomegaly.  MUSCULOSKELETAL:  No clubbing, cyanosis or edema.  Patient had diminished  pedis dorsalis  pulses.  SKIN:  Warm and dry.  There are multiple scaly lesions in his right  forehead, also the top of his scalp.  He also had a verrucous lesion on his  left nasal fold.   IMPRESSIONS/RECOMMENDATIONS:  1. 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  2. Long standing tobacco abuse with probable chronic obstructive lung      disease.  3. Paroxysmal atrial fibrillation, now in sinus rhythm with no structural      heart disease.  4. Health maintenance.   RECOMMENDATIONS:  1. Patient will be referred to [facility] for followup regarding need for      biopsy, treatment of possible squamous cell cancer of his face.  2. He was given a prescription for Chantix, not quite sure how motivated      patient is in completely discontinuing tobacco, however he seems      somewhat receptive to trying Chantix.  We will also obtain baseline      pulmonary function tests and document extent of probable chronic      obstructive lung disease/possible emphysema.  He may benefit from      Spiriva or other inhalers.  3. Review of the chart notes that he has had including colonoscopy in the      past performed by Dr. Candelaria Stagers in June of 1999.  Patient noted to have      normal colonoscopy.  Patient will likely need repeat exam within the      next 1-2 years.  4. Patient will follow-up in approximately 4-6 weeks time.  We will need      to insure patient is      vaccinated against flu this October and we will need to check the      status of his Pneumovax on followup visit.                                   Barbette Hair. Artist Pais, DO   RDY/MedQ  DD:  04/28/2006  DT:  04/28/2006  Job #:  [id number]

## 2011-01-21 NOTE — Op Note (Signed)
[name], Andrew [name]                  ACCOUNT NO.:  [id number]   MEDICAL RECORD NO.:  [id number]          PATIENT TYPE:  AMB   LOCATION:  ENDO                         FACILITY:  [facility]   PHYSICIAN:  Georgiana Spinner, M.D.    DATE OF BIRTH:  1941-08-27   DATE OF PROCEDURE:  10/07/2005  DATE OF DISCHARGE:                                 OPERATIVE REPORT   PROCEDURE:  Upper endoscopy.   INDICATIONS:  GERD.   ANESTHESIA:  Demerol 50; Versed 5 mg.   PROCEDURE:  With the patient mildly sedated in the left lateral decubitus  position, the Olympus videoscopic endoscope was inserted in the mouth and  passed under direct vision through the esophagus, which appeared normal  until we reached the distal esophagus, and there appeared to be a fairly  distinct stricture. The patient, however, had not complained of any  dysphagia, so we entered into the stomach through a hiatal hernia.  Fundus,  body, antrum, duodenal bulb, second portion of duodenum were visualized.  From this point, the endoscope was slowly withdrawn, taking circumferential  views of duodenal mucosa until the endoscope had been pulled back in the  stomach placed in retroflexion to view the stomach from below. The endoscope  was then straightened and withdrawn, taking circumferential views of the  remaining gastric and esophageal mucosa, stopping in the distal esophagus  distal to the stricture. There was a small ulceration seen and biopsied. The  endoscope was withdrawn. The patient's vital signs and pulse oximeter  remained stable. The patient tolerated the procedure well, without  complication.   FINDINGS:  Stricture of distal esophagus, with a small ulcer just distal to  this.  Mild inflammatory changes of the duodenal bulb.   PLAN:  Await biopsy report. The patient will call me for results and follow  up with me as an outpatient.           ______________________________  Georgiana Spinner, M.D.     GMO/MEDQ  D:  10/07/2005   T:  10/07/2005  Job:  [id number]

## 2011-01-21 NOTE — Assessment & Plan Note (Signed)
[facility]                         GASTROENTEROLOGY OFFICE NOTE   Andrew [name], FOOKS                         MRN:          [id number]  DATE:12/12/2006                            DOB:          [birth year]    PROBLEM:  Dysphagia.   Mr. Proby is a pleasant, 70 year old, white male referred through the  courtesy of Dr. Artist Pais for evaluation.  He has been complaining of  progressive dysphagia to solids.  He has a history of a distal  esophageal stricture diagnosed in 1999.  Erosive esophagitis was also  seen.  He does complain of pyrosis for which he is taking Nexium with  good results.  His main GI complaint is immediate postprandial bloating.  He complains of early satiety though his weight has been stable.  He  also has a remote history of colon polyps.  Last colonoscopy nine years  ago was negative (the proximal colon was nonvisualized).   PAST MEDICAL HISTORY:  Hypertension.   FAMILY HISTORY:  Pertinent for diabetes in several siblings.   MEDICATIONS:  Nexium, Metoclopramide, and Cartia.   He is ALLERGIC TO NOVOCAIN AND PROCAINE.   He smokes a pack a day, he does not drink, he is divorced and retired.   REVIEW OF SYSTEMS:  Positive for night sweats and back pain.   PHYSICAL EXAMINATION:  VITAL SIGNS:  Pulse 76, blood pressure 124/84,  weight 177.  HEENT:  EOMI. PERRLA. Sclerae are anicteric.  Conjunctivae are pink.  NECK:  Supple without thyromegaly, adenopathy or carotid bruits.  CHEST:  Clear to auscultation and percussion without adventitious  sounds.  CARDIAC:  Regular rhythm; normal S1 S2.  There are no murmurs, gallops  or rubs.  ABDOMEN:  He has some moderate distention and minimal tympany.  There is  minimal mid epigastric tenderness without guarding or rebound.  There  are no abdominal masses or organomegaly.  Bowel sounds are normoactive.  EXTREMITIES:  Full range of motion.  No cyanosis, clubbing or edema.  RECTAL:  Deferred.   IMPRESSION:  1. Symptomatic distal esophageal stricture.  2. Postprandial bloating.  This raises the question of hypomotility      versus partial gastric outlet obstruction or possibly due to      gastroesophageal reflux disease.  3. History of colon polyps.   RECOMMENDATION:  1. Continue Nexium.  2. Upper endoscopy with dilatation is indicated.  3. Colonoscopy.     Barbette Hair. Arlyce Dice, MD,FACG  Electronically Signed    RDK/MedQ  DD: 12/12/2006  DT: 12/12/2006  Job #: [id number]

## 2011-01-21 NOTE — Assessment & Plan Note (Signed)
[facility]                              CARDIOLOGY OFFICE NOTE   Andrew [name], PERUSSE                         MRN:          [id number]  DATE:04/07/2006                            DOB:          27-Jan-1941    [name] returns today. He is actually a patient of Dr. Myrtis Ser. He had PAF in  the hospital and was seen by Dr. Myrtis Ser in February. The patient has had  occasional palpitations since then. When he was discharged he had an  echocardiogram with good LV function and nonischemic Myoview. He needs  primary care doctor.   He has some COPD.   PHYSICAL EXAMINATION:  VITAL SIGNS:  Blood pressure 128/70, pulse 70 and  regular.  LUNGS:  Clear.  NECK:  Carotids normal.  __________ normal heart sounds.  ABDOMEN:  Benign.  EXTREMITIES:  Good pulses, no edema.   His EKG is normal.   IMPRESSION:  Paroxysmal atrial fibrillation with recent hospitalization. The  patient continues to have occasional palpitations. Will keep him on Cartia.  He will follow up with Dr. Myrtis Ser in six months. We will try to get him in to  see one of our primary care doctors.   He does not need Coumadin since he is maintaining sinus rhythm and has a  structurally normal heart.                               [name]. Eden Emms, MD, [facility]    PCN/MedQ  DD:  04/07/2006  DT:  04/07/2006  Job #:  [id number]

## 2011-01-21 NOTE — Op Note (Signed)
Andrew [name], Andrew [name]                  ACCOUNT NO.:  [id number]   MEDICAL RECORD NO.:  [id number]          PATIENT TYPE:  AMB   LOCATION:  ENDO                         FACILITY:  [facility]   PHYSICIAN:  Georgiana Spinner, M.D.    DATE OF BIRTH:  07/05/1941   DATE OF PROCEDURE:  12/26/2005  DATE OF DISCHARGE:                                 OPERATIVE REPORT   PROCEDURE:  Esophageal dilation.   INDICATIONS:  Dysphagia.   ANESTHESIA:  Demerol 50, Versed 5 milligrams.   PROCEDURE:  With the patient mildly sedated in the left lateral decubitus  position the Olympus videoscopic endoscope was inserted in the mouth and  passed under direct vision through the esophagus which appeared normal  except for distal esophageal stricture which was photographed. We entered  into the stomach.  Fundus, body, antrum appeared normal. The guidewire was  passed. The endoscope was withdrawn. Subsequently 15 and 17 Savary dilators  were passed and with the latter the guidewire was removed. The endoscope was  reinserted, the endoscope was withdrawn. The patient's vital signs, pulse  oximeter remained stable. The patient tolerated procedure well without  apparent complications.   FINDINGS:  Distal esophageal stricture dilated, await clinical response. The  patient will call me with results and follow-up with me as an outpatient.           ______________________________  Georgiana Spinner, M.D.     GMO/MEDQ  D:  12/26/2005  T:  12/27/2005  Job:  [id number]

## 2011-01-21 NOTE — Op Note (Signed)
[facility]  Patient:    Andrew [name], Andrew [name] Visit Number: [id number] MRN: [id number]          Service Type: DSU Location: DAY Attending Physician:  Meredith Leeds Dictated by:   Zigmund Daniel, M.D. Proc. Date: 01/04/02 Admit Date:  01/04/2002 Discharge Date: 01/04/2002                             Operative Report  PREOPERATIVE DIAGNOSIS:  Perirectal abscess.  POSTOPERATIVE DIAGNOSIS:  Perirectal abscess.  OPERATION PERFORMED:  Incision and drainage of perirectal abscess.  SURGEON:  Zigmund Daniel, M.D.  ANESTHESIA:  General.  DESCRIPTION OF PROCEDURE:  After the patient was monitored and anesthetized and had routine preparation and draping of the perineum, I examined the anal canal and distal rectum. The mucosa appeared to be normal. There were sizable hemorrhoids. I could not see any tumors or any fistula and could not demonstrate a fistula into the anal canal with pressure on the abscess. The abscess was in the anterolateral region. I could feel induration going up along inside of the rectum for several centimeters. I made a radial incision over the mid point of the abscess and dissected straight down to it after getting hemostasis in the skin and subcutaneous tissues with the Bovie. I entered the abscess and found out it went anteriorly about to the midline but did not cross the midline, went posteriorly and deep out into the ischial rectal fossa and was quite large and a copious amount of foul smelling pus drained out. It seemed to go more or less intersphincteric cephalad up the rectum, but I did not feel there was any real supralevator component of it. Because of the size of the abscess, I made two counter incisions and put a Penrose drain in and then packed the abscess cavity with moist gauze and applied a bulky bandage after getting good hemostasis. The patient was stable through the procedure. Dictated by:   Zigmund Daniel,  M.D. Attending Physician:  Meredith Leeds DD:  01/04/02 TD:  01/06/02 Job: [id number]

## 2011-01-21 NOTE — Consult Note (Signed)
[place]  Patient:    Andrew [name], HEITMAN Visit Number: [id number] MRN: [id number]          Service Type: EMS Location: [place] Attending Physician:  Cathren Laine Dictated by:   [place] Daphine Deutscher, M.D. Proc. Date: 01/02/02 Admit Date:  01/02/2002                            Consultation Report  REASON FOR CONSULTATION:  The patient seen in minor room 6 at 1800 hours at check in, seen by me at 1900 hours for rectal pain. This is a 70 year old man who presented with a painful BM and tenderness that has been increasing. He was seen by Dr. Beverely Pace and felt to have a perirectal abscess on exam. The patient has had fever, chills with this.  IMPRESSION:  Acute perirectal abscess.  PROCEDURE AND PLAN:  The patients area was prepped with Betadine and draped sterilely. He had a tender mass on the left buttock cleft. This was infiltrated with 2% lidocaine plain, and a radial incision was made, and it was injected deep, and then I entered a substantial perirectal abscess. Copious amount of purulent material was obtained. The area was irrigated with hydrogen peroxide and was packed with about 2 inches of 1/4 inch Iodoform gauze. The remaining portion of the gauze was given to the patient for him to bring with him for follow up packing. He will be given Augmentin 875 b.i.d. and some Percocet for pain, and be followed up in the office in two days.  IMPRESSION #2:  Acute perirectal abscess status post incision, drainage, and packing. Dictated by:   [place] Daphine Deutscher, M.D. Attending Physician:  Cathren Laine DD:  01/02/02 TD:  01/03/02 Job: [id number]

## 2011-01-21 NOTE — Cardiovascular Report (Signed)
NAME:  Andrew [name], Andrew [name]                            ACCOUNT NO.:  [id number]   MEDICAL RECORD NO.:  [id number]                   PATIENT TYPE:  OIB   LOCATION:  2866                                 FACILITY:  [facility]   PHYSICIAN:  Meade Maw, M.D.                 DATE OF BIRTH:  Oct 08, 1940   DATE OF PROCEDURE:  07/09/2003  DATE OF DISCHARGE:                              CARDIAC CATHETERIZATION   PROCEDURE PERFORMED:  Cardiac catheterization.   CARDIOLOGIST:  Meade Maw, M.D.   INDICATIONS FOR PROCEDURE:  Persistent tachycardia, equivocal changes on  stress Cardiolite in the inferior and apical regions.   DESCRIPTION OF PROCEDURE:  After obtaining written informed consent the  patient was brought o the cardiac catheterization lab in a post absorptive  state.  Preop sedation was achieved using Versed 10 mg IV and fentanyl 50  mcg.  The right groin was prepped and draped in the usual sterile fashion.  Local anesthesia was achieved using 1% Xylocaine.  A 6 French hemostasis  sheath ws placed into the right femoral artery.  The SMART needle had to be  utilized secondary to difficulty with obtaining arterial access.   Alk catheter exchanges were made over a guidewire.  Hemostasis was achieved  with flushing at each catheter exchange.   The patient tolerated the procedure well and was subsequently transferred ot  the holding room.   NOTE:  The patient was noted to have intracoronary spasm with initial  engagement of the right coronary artery and he was given 200 mcg of IV  nitro.  During the coronary spasm he had complaints of chest pain.  He has  ot experienced previous chest pain of this nature.   FINDINGS:  The aortic pressure was 130/75.  LV pressure was 130/11.  EDP was  14.   VENTRICULOGRAPHIC DATA:  Single-plane ventriculogram revealed normal wall  motion.  Ejection fraction of 65%.   ANGIOGRAPHIC DATA:  Coronary Angiography:  Left Main Coronary Artery: The left main  coronary artery bifurcates into the  left anterior descending and circumflex vessel.  There is no significant  disease in the left main coronary artery.   Left Anterior Descending:  The left anterior descending is a small vessel  giving rise to a large D-1 and small D-2, and he has an apical branch.  There are luminal irregularities in the left anterior descending artery.   Circumflex Vessel:  The circumflex vessel is a moderate-sized vessel.  It  gives rise to a large OM-1 and goes on to end as a posterolateral branch.  Following the first large obtuse marginal there is a 40-50% proximal lesion  noted.   Right Coronary Artery:  The right coronary artery is a large dominant  artery.  It gives rise to two RV marginals and then a trifurcating  posterolateral branch.  There are sequential proximal lesions of up to 30-  40% in the right coronary artery.   FINAL IMPRESSION:  Noncritical coronary artery disease.   The patient did experience angina with spasm of his right coronary artery,  therefore unlikely that this has been the etiology of his chest pain.   RECOMMENDATIONS:  An event monitor to evaluate for his tachycardia and  possibly presyncopal syncopal episodes.                                               Meade Maw, M.D.    HP/MEDQ  D:  07/09/2003  T:  07/09/2003  Job:  [id number]

## 2011-01-21 NOTE — Assessment & Plan Note (Signed)
[facility]                            CARDIOLOGY OFFICE NOTE   KOLTIN, WEHMEYER                         MRN:          [id number]  DATE:10/11/2006                            DOB:          1940-11-12    Andrew [name] returns today for followup.  I saw him in the hospital.  He had an  isolated episode of A-fib.  He was not thought to be a great Coumadin  candidate due to some forgetfulness and previous falls.  In talking to  the patient, he has not had any recurrence.  He had an echo that was low-  risk with good LV function and a non-ischemic Myoview.   He is a heavy smoker.  We talked about this.  He denies having COPD and  I explained to him that he probably did.  I think Dr. Artist Pais ordered PFTs.  I would agree that he should be taking a baby aspirin a day and Cartia.   The patient has not had any recurrent palpitations, PND, orthopnea or  syncope.   REVIEW OF SYSTEMS:  Remarkable for significant left-knee pain, where he  fell off a ladder.  He needs to be referred for orthopedics.  I suspect  he will need a scope.  He does not want to see Dr. Doristine Section.  His  wife had multiple problems with her knee and had over 37 surgeries for  infection and reactions to sutures and she was under the care of Dr.  Renae Fickle.   PATIENT'S MEDICATIONS INCLUDE:  1. Metoclopramide 10 t.i.d.  2. Nexium 40 a day.  3. Cartia 240 a day.  4. Aspirin.   EXAM:  He looks well.  He is in sinus rhythm at a rate of 60.  Blood  pressure is 130/70.  HEENT:  Normal.  LUNGS:  Clear, without wheezing.  There is an S1, S2, normal heart  sounds.  ABDOMEN:  Benign.  LOWER EXTREMITIES:  Intact pulses, no edema.  He has restricted range of  motion in his left knee.   IMPRESSION:  Stable isolated episode of PAF.  Continue aspirin therapy  and Cartia for rate control and blood pressure control.  Echo and  Myoview non-ischemic.  The patient will follow up with orthopedics in  regards to  possible knee arthroscopy.  He will see Dr. Artist Pais for his  primary care needs.  We continue to encourage him to stop smoking, but I  do not think he is very motivated to do this.     [name]. Eden Emms, MD, [facility]  Electronically Signed    PCN/MedQ  DD: 10/11/2006  DT: 10/11/2006  Job #: [id number]

## 2011-01-21 NOTE — Discharge Summary (Signed)
NAMEICARUS, PARTCH                  ACCOUNT NO.:  [id number]   MEDICAL RECORD NO.:  [id number]          PATIENT TYPE:  INP   LOCATION:  3707                         FACILITY:  [facility]   PHYSICIAN:  Charlton Haws, M.D.     DATE OF BIRTH:  01-Aug-1941   DATE OF ADMISSION:  10/08/2005  DATE OF DISCHARGE:  10/09/2005                                 DISCHARGE SUMMARY   PRIMARY CARDIOLOGIST:  New -- the patient will be following up with Dr.  Eden Emms.   PRIMARY CARE PHYSICIAN:  None.   GASTROENTEROLOGIST:  Dr. Virginia Rochester.   DISCHARGING DIAGNOSES:  1.  Chest pain, status post exercise Myoview with normal ejection fraction,      negative for ischemia, negative cardiac enzymes.  EKG:  Normal sinus      rhythm without acute ST-T wave changes.  2.  Palpitations, questionable paroxysmal supraventricular tachycardia.  The      patient had burst of 2 seconds of atrial fibrillation x1 that converted      spontaneously without associated chest discomfort or shortness of      breath.  3.  Shortness of breath, multifactorial.  The patient has a long history of      tobacco abuse with questionable chronic obstructive pulmonary disease.  4.  Gastroesophageal reflux disease with dysphagia, recent diagnosis of      esophageal stricture with ulcer on esophagogastroduodenoscopy with      biopsy results pending.  The patient is to follow up with Dr. Virginia Rochester as      previously scheduled.   PAST MEDICAL HISTORY:  1.  Ongoing tobacco use.  2.  History of medical noncompliance secondary to financial difficulties in      the past.  3.  Untreated hypertension.  4.  Untreated hyperlipidemia.  5.  Treated nonobstructive coronary artery disease.  Status post cardiac      catheterization, November of 2004, by Dr. Meade Maw.  6.  History of paroxysmal supraventricular tachycardia diagnosed in 2000 per      patient, treated with diltiazem at that time; however, the patient has      had difficulties getting his prescriptions  filled and stopped taking his      medications.  7.  Recent diagnosis of esophageal stricture.   HOSPITAL COURSE:  Mr. Deboy is a 70 year old male with medical history as  stated above, who was feeling in his usual state of health when he began  having palpitations on the morning of admission.  He states he had a sudden  onset of palpitations with chest tightness, shortness of breath, nausea,  diaphoresis and presyncope, similar to previous episodes in the past;  however, this episode did not resolve within 10 minutes; he called EMS.  The  patient was reportedly in a narrow complex tachycardia with a heart rate of  175 from EMS, although strips are not available.  The patient was treated  with aspirin and nitroglycerin, had spontaneously resolution of his  tachycardia, cardiac enzymes without elevation in troponin, baseline EKG  showing normal sinus rhythm.  The patient did, however,  have several  episodes of paroxysmal tachycardia in the emergency room.  There was  spontaneous termination of tachycardia.  Subsequent strips of atrial  tachycardia showed a brief deterioration into atrial fibrillation prior to  spontaneous resolution.  The patient had no further episodes of atrial  fibrillation recorded.  The patient chest x-ray showed probable COPD, no  active lung disease, cardiac enzymes negative.  The patient was scheduled  for a dobutamine stress test; however, the patient stated he did not want to  have any chemicals injected; he decided to attempt an exercise Myoview,  exercise Myoview reviewed by Dr. Eden Emms, normal EF, negative for ischemia,  discussed options with patient.  At this time, the patient requests to be  discharged and follow up with outpatient.   DISCHARGE MEDICATIONS:  1.  At time of discharge, he has a prescription for nitroglycerin for chest      discomfort.  2.  Nexium as previously.  3.  Aspirin 81 mg daily.  4.  Cardizem 120 mg daily.  5.  Combivent inhaler 2  puffs every 6 hours as needed.   DISCHARGE INSTRUCTIONS:  The patient has been instructed to discontinue  smoking.   FOLLOWUP:  I have left a message with the office to schedule the patient a  followup appointment.  He will need to have an echocardiogram done initially  and then followup with Dr. Eden Emms for reevaluation.  The patient continues  to complain of palpitations, possibly a repeat Holter monitor, as our group  has never done a Holter monitor on him; last Holter monitor per patient was  done by Dr. Fraser Din when he was under her care.  The patient also will need  a followup pulmonary evaluation for shortness of breath if no improvement.  The patient states he would rather follow up outpatient with this also.   SPECIAL DISCHARGE INSTRUCTIONS:  I have instructed the patient to use the  nitroglycerin if his chest discomfort returns and to keep his appointments  for the echocardiogram and follow up with Dr. Eden Emms.  He also states he  will follow up with Dr. Virginia Rochester for biopsy results from his esophageal workup.   CONDITION ON DISCHARGE:  At time of discharge, the patient is afebrile,  pulse of 78 and regular, blood pressure of 147/74.  The patient may need  titration in his Cardizem for optimal blood pressure control.  I would avoid  a beta blocker in the future secondary to COPD/ongoing tobacco abuse.  I  have reinforced with the patient the need to continue his medication and  seek assistance with his medication if needed for financial issues, as the  patient has had uncontrolled hypertension in the past.   DURATION OF DISCHARGE ENCOUNTER:  Thirty-five minutes.      Dorian Pod, NP    ______________________________  Charlton Haws, M.D.    MB/MEDQ  D:  10/09/2005  T:  10/10/2005  Job:  [id number]   cc:   Georgiana Spinner, M.D.  Fax: [phone]

## 2011-01-21 NOTE — H&P (Signed)
Andrew [name], Andrew [name] NO.:  [id number]   MEDICAL RECORD NO.:  [id number]          PATIENT TYPE:  INP   LOCATION:  3707                         FACILITY:  [facility]   PHYSICIAN:  Verne Grain, MD   DATE OF BIRTH:  1941-04-12   DATE OF ADMISSION:  10/08/2005  DATE OF DISCHARGE:                                HISTORY & PHYSICAL   GASTROENTEROLOGIST:  Georgiana Spinner, M.D.   CARDIOLOGIST:  Has seen Meade Maw, M.D. (cardiac catheterization  November 2004). Subsequently received a letter saying that due to insurance  difficulties, he could no longer follow with this physician group and  subsequently made the decision to not pursue any further followup with  cardiologist.   CHIEF COMPLAINT:  Chest pain/tightness, accompanying palpitations/paroxysms  of SVT/narrow complex tachycardia.   HISTORY OF PRESENT ILLNESS:  A 70 year old male with ongoing tobacco use,  COPD, history of medical non-compliance attributed to financial difficulties  in the past, untreated hypertension, untreated hyperlipidemia, untreated non-  obstructive coronary artery disease, status post cardiac catheterization in  November 2004 by Dr. Meade Maw, history of paroxysmal SVT diagnosed in  2000 per patient, treated with diltiazem in the past.  However, the patient  has not been taking the diltiazem attributed secondary to financial  difficulties, has rare instances of palpitations, off all medications  (episode ever three to six months, lasting approximately 10 minutes or less)  and although these episodes are often symptomatic with chest pain, shortness  of breath, diaphoresis, nausea and presyncope, they have been infrequent  enough and of short enough duration that he has not sought further medical  care.  He was feeling in his usual state of health.  He underwent an  esophagogastroduodenoscopy with Dr. Virginia Rochester yesterday which was notable for  distal esophageal stricture which does not sound  like it was treated as well  as an ulcer distal to the stricture. Biopsies were taken of this ulcer, the  results of which are still pending.  The patient again reports feeling well  this morning.  He reports feeling in his usual state of health until the  sudden onset of palpitations with chest tightness, shortness of breath,  nausea, diaphoresis, and presyncope similar to his previous episodes.  However, this episode did not resolve within 10 minutes and estimated in  duration of approximately 30 minutes prior to him calling EMS.  The patient  was reportedly in a narrow complex tachycardia with a heart rate of 170 from  EMS although strips were not seen.  The patient was treated with aspirin and  nitroglycerin and apparently subsequently had spontaneous resolution of his  tachycardia.  In the [facility] Emergency Room, cardiac markers have been  negative x2.  His baseline EKG reveals sinus rhythm with no changes  diagnostic of ischemia.  He has some paroxysms of tachycardia in the  emergency room, one of which had a few beats captured on a 12-lead EKG.  Tachycardia cycle length 415 msec, regular, no clear P waves seen.  However,  the baseline artifact made termination of atrial activity  difficult.  There  is a spontaneous termination with a brief pause and resumption of sinus  rhythm.  The patient's telemetry was also reviewed with one episode of a  narrow complex tachycardia, cycle length of approximately 340 msec at the  end of which became somewhat irregular.  There did appear to be deflections  representing atrial activity.  This telemetry is consistent with a long RP  tachycardia.  Interpretation of this telemetry strip was thought to be most  consistent with atrial tachycardia (i.e., pulmonary vein tachycardia) with  subsequent deterioration into brief atrial fibrillation prior to spontaneous  resolution.  The patient does believe that people have told him that he has  atrial  fibrillation in the past although he has never had to be on any  antiarrhythmic medication other than diltiazem and has never been on  Coumadin in the past.  No history of thromboembolic phenomenon.   ALLERGIES/ADVERSE REACTIONS:  PROCAINE, NOVOCAIN.   CURRENT MEDICATIONS:  1.  Nexium as prescribed by Dr. Virginia Rochester.  2.  Heart medications prescribed in the office including Cardizem have not      been taken, again attributed secondary to financial difficulties.   PAST MEDICAL HISTORY:  1.  Non-obstructive coronary artery disease, status post cardiac      catheterization by Dr. Meade Maw in November 2004 with notation of      right coronary artery spasm when engaged with the diagnostic catheter,      treated effectively with nitroglycerin.  Left ventricular ejection      fraction 65% with no wall motion abnormalities.  Left main without      significant obstruction. The LAD with luminal irregularities.  Left      circumflex with a 40-50% OM-1 and right coronary artery that was large      and dominant with 30-40% ostial lesions noted.  2.  History of untreated hypertension (financial difficulties/non-      compliance).  3.  History of untreated hyperlipidemia (financial difficulties/non-      compliance).  4.  History of paroxysmal tachycardia since 2000 treated with diltiazem in      the past, possibly recognizing the term atrial fibrillation upon      interview when obtaining HPI as described above.  No history of      antiarrhythmic therapy.  No history of anticoagulation noted, of embolic      phenomena.  5.  History of gastroesophageal reflux disease, status post EGD October 07, 2005 by Dr. Virginia Rochester with finding of distal esophageal stricture and a small      ulceration just distal to the stricture. Biopsy results pending, treated      with chronic proton pump inhibitor.  6.  Remote history of perirectal abscess, status post incision and drainage      under general anesthesia in May  2003. 7.  Positive family history of early coronary artery disease (brother with      myocardial infarction in his 12's with many subsequent serial myocardial      infarctions and stents and mother who died with a fatal myocardial      infarction at age 84).  8.  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  9.  Ongoing tobacco use (less than one pack per day per patient), COPD.   SOCIAL HISTORY:  The patient lives in [place] with his girlfriend.  He  does piddling around the house.  He smokes  less than one pack per day.  He  consumes infrequent alcohol reporting approximately 10-15 beers per year.  He reports no illicit drug use in his past.   FAMILY HISTORY:  The patient's mother died of a fatal myocardial infarction  at age 12.  The patient's father died at age 73 secondary to a medication  reaction while in the hospital.  He also had a history of epilepsy and  some kind of heart trouble.  The patient has one brother with history of  myocardial infarctions that began in his 97's, subsequent myocardial  infarctions and stents following that.  The patient has four sisters, two of  whom have diabetes, but none of whom have known cardiac disease to the  patient's knowledge.   REVIEW OF SYSTEMS:  No recent fevers, chills, sweats, weight change or  adenopathy.  No recent headaches.  No acute alterations in auditory or  visual acuity.  The patient reports no skin rash.  He did have chest  tightness and shortness of breath.  No nausea, diaphoresis, presyncope  associated with his paroxysms of  tachycardia as described in HPI.  He,  however, has no dyspnea on exertion, no acute alterations in his exercise  tolerance.  He reports no claudication.  He does have occasional cough and  wheezing consistent with COPD. He has no bowel or bladder complaints.  He  has no focal or psychiatric complaints. He has no acute musculoskeletal  complaints.  He has nausea and diaphoresis  as mentioned above.  Otherwise,  reports no change in his bowel habits.  He has no polyuria, polydipsia, no  skin or hair changes suggestive of endocrinologic abnormalities. No heat or  cold intolerance reported.  All other systems are negative.   PHYSICAL EXAMINATION:  VITAL SIGNS:  Temperature 97.8, heart rate 70,  respiratory rate 20, blood pressure 167/95, oxygen saturation 97% on 2 L  nasal cannula.  GENERAL:  The patient has no apparent distress.  He is alert, cooperative  and answers questions appropriately.  HEENT:  He is normocephalic, atraumatic.  Extraocular eye movements are  intact.  Oropharynx pink and moist without lesions.  NECK:  Supple.  There is no thyromegaly.  No carotid bruits.  No jugular  venous distention.  No palpable lymphadenopathy.  CARDIOVASCULAR:  Notable for regular S1 and regular S2. There are no murmurs  appreciated.  LUNGS:  Lung fields reveal bilateral wheezes and rhonchi throughout both  lung fields.  SKIN:  Brief skin examination is without acute rash. ABDOMEN:  Soft, nontender, nondistended with positive bowel sounds.  EXTREMITIES:  Lower extremity examination reveals no evidence of edema.  MUSCULOSKELETAL:  No acute findings.  NEUROLOGIC:  Limited neurologic examination is grossly nonfocal.  The  patient is alert, answers questions appropriately.  Moves all four  extremities without difficulty without any obvious motor or sensory deficit  although gait was not tested.   LABORATORY DATA:  Chest x-ray:  Probable COPD, no active lung disease.  EKG:  Sinus rhythm at a rate of 70 with a normal axis, normal PR, QRS and  QTC intervals. No evidence of left ventricular hypertrophy.  No previous EKG  to compare.  No Q waves are noted.  Borderline electrical alternans possibly  attributable to respiratory variation on baseline EKG.  Followup 12-lead EKG  showing the end and spontaneous termination of a narrow-complex tachycardia  with a tachycardia cycle  length of 450 msec.  Tachycardia was regular and  terminated with a pause  prior to the resumption of sinus rhythm.  There are  no clear P waves seen in this portion of the strip although baseline  artifact made atrial activity difficult to appreciate.  Subsequent telemetry  strips reviewed that revealed a narrow complex regular tachycardia at  approximately 340 msec, then some irregularity that may be most consistent  with degeneration into atrial fibrillation, some evidence of P wave activity  in the region consistent with long RP tachycardia, possible summary  diagnosis of a long RP tachycardia, possible pulmonary vein tachycardia  degenerating into a brief paroxysm of atrial fibrillation prior to  spontaneous resolution.   White blood cell count 8.7, hematocrit 42, platelet count 211, CK-MB less  than 1, 1.5; troponin-I less than 0.05 x2.  Myoglobin 55, 67.  Chem-7 not  obtained.  LFTs not obtained.   IMPRESSION AND PLAN:  A 70 year old male, tobacco, non-compliance, untreated  hypertension, non-compliance/financial difficulties with medications and  medical followup in the past, untreated hyperlipidemia, history of non-  obstructive coronary artery disease on catheterization in November 2004,  history of paroxysmal tachycardia (possible paroxysmal atrial fibrillation  in the past) originally diagnosed in 2000 per patient, treated with  diltiazem in the past but not taking diltiazem secondary to expensive  medication and financial difficulty with medical followup, further medical  evaluation not pursued secondary to relatively low frequency of episodes and  usual resolution of tachycardia in 10 minutes or less spontaneously (no  known history of any effect of vagal maneuvers), history of esophageal  stricture diagnosed on EGD with small ulcer that was biopsied yesterday by  Dr. Virginia Rochester.  Brought to the emergency room because of a prolonged episode of  symptomatic tachycardia with chest  tightness, shortness of breath, nausea, diaphoresis and presyncope; tachycardia lasting approximately 30 minutes,  reported heart rate on arrival of EMS in the 170's.  Strips not available.  EKG and telemetry strips as described above with narrow complex regular  tachycardia, cycle 450 msec with spontaneous resolution, pause and sinus  rhythm in subsequent telemetry strips with a narrow complex regular  tachycardia  at 340 msec, long RP tachycardia subsequently degenerating into  what appears to be atrial fibrillation prior to spontaneous resolution.  Episodes of tachycardia in the emergency room reproducing the patient's  symptoms on presentation.   Problem #1.  Narrow complex tachycardia, possibly secondary to long RP  regular tachycardia with some occasional degeneration into atrial  fibrillation prior to spontaneous resolution, significantly symptomatic with  chest tightness, shortness of breath, nausea, and presyncope.  Will initiate  diltiazem 30 mg p.o. q.6h. and titrate it up as tolerated although the  patient has history of coronary artery disease making Metoprolol a more  attractive option.  He has significant chronic obstructive pulmonary disease  with active wheezing and, therefore, will not use Metoprolol in this  patient.  It is likely that, if the patient has improved rate control with  his paroxysms of tachycardia, that, in light of their infrequent recurrence,  AV nodal blocker alone may provide satisfactory therapy.  If episodes become  more frequent, he could be considered for more rhythm control medications  such as dofetilide.  Will monitor the patient on telemetry overnight.  If  there are any sustained episodes of atrial fibrillation, may need to  consider anticoagulation. However, previously documented portions of  tachycardia are primarily regular with degenerated atrial fibrillation,  episodes lasting only seconds prior to spontaneous resolution.  A Holter   monitor could be considered  as an outpatient confirmed no significant  prolonged episodes of atrial fibrillation.  Will check TSH and free T4.  I  will check an echocardiogram to assess ejection fraction and rule out any  significant valvular heart disease.   Problem #2.  Chest pain, shortness of breath, nausea, and diaphoresis with  tachycardia.  Will continue to obtain serial cardiac markers to exclude  myocardial infarction and monitor on telemetry overnight with followup EKGs.  Will order dobutamine Cardiolite to rule out ischemia (suboptimal candidate  for adenosine or dipyridamole secondary to COPD and active wheezing) with  instructions to hold diltiazem 12 hours before Cardiolite examination so  that an adequate heart rate can be obtained.  If the patient has paroxysms  of tachycardia with dobutamine administration, he should have diltiazem IV  administered for improvement of rate control after injection of Cardiolite  to rule out any significant areas of ischemia.  Will continue aspirin 81 mg p.o. daily for chronic treatment of his nonobstructive coronary artery  disease.   Problem #3.  Tobacco.  Continue to encourage cessation and order tobacco  cessation consultation.   Problem #4.  Untreated hyperlipidemia.  Will check LFTs in the morning.  Will check lipid profile and start p.o. Zocor 40 mg p.o. at night for  optimization of his medical regimen. Non-obstructive coronary artery disease  in the past.   Problem #5.  Untreated hypertension.  Titrate up diltiazem as previously  stated.  If blood pressure control is still suboptimal, could add an ACE  inhibitor to achieve adequate control.  Will check a chem-7 to assure normal  creatinine and potassium at baseline.   Problem #6. History of gastroesophageal reflux disease, dysphagia, recent  diagnosis of esophageal stricture with ulcer on EGD yesterday with biopsy  results pending.  The patient currently has no chest pain at  rest, no  difficulty with swallowing beyond his baseline dysphagia.  Will order a  Gastrografin swallow to evaluate his complaint of dysphagia and continue his  proton pump inhibitor as previously prescribed.  Followup with Dr. Virginia Rochester in  gastroenterology.           ______________________________  Verne Grain, MD     DDH/MEDQ  D:  10/08/2005  T:  10/09/2005  Job:  [id number]

## 2011-01-25 ENCOUNTER — Ambulatory Visit (INDEPENDENT_AMBULATORY_CARE_PROVIDER_SITE_OTHER): Payer: Medicare Other | Admitting: Vascular Surgery

## 2011-01-25 ENCOUNTER — Encounter (INDEPENDENT_AMBULATORY_CARE_PROVIDER_SITE_OTHER): Payer: Medicare Other

## 2011-01-25 DIAGNOSIS — I714 Abdominal aortic aneurysm, without rupture, unspecified: Secondary | ICD-10-CM

## 2011-01-25 NOTE — Assessment & Plan Note (Signed)
OFFICE VISIT  Andrew [name], Andrew [name] DOB:  1941-02-01                                       01/25/2011 ZOXWR#:[id number]  Patient presents today for continued follow-up of his abdominal aortic aneurysm which was found as an incidental finding on a CAT scan 6 months ago.  This was at the time of renal stone.  He continues to be quite active, has had no change in his medical history.  He has no cardiac disease.  He is a diabetic.  He does continue to smoke cigarettes, unfortunately. Review of systems otherwise unchanged.  PHYSICAL EXAMINATION:  A well-developed and well-nourished white male appearing stated age in no acute distress.  Blood pressure is 136/62, pulse 59, respirations 18.  He is in no acute distress.  HEENT is normal.  His abdomen is soft, nontender.  He does have a prominent aortic pulsation with no tenderness.  Musculoskeletal shows no major deformity or cyanosis.  Neurologic:  No focal weakness or paresthesias. Skin without ulcers or rashes.  He does have 2+ radial and 2+ dorsalis pedis pulses bilaterally.  He underwent repeat ultrasound today in our office, and this shows no significant change with the maximal diameter of his aorta being 4.5 cm, which is no significant change from his CT showing an aneurysm of 4.6.  I have recommended that we see him again in 6 months for continued 6- month follow-up.  He reports that he may be in [place] at that time and he will return in 7 or 8 months, and I said that this would be fine.  If he is going to be longer, he needs to arrange ultrasound in [place].  He will continue blood pressure control and will see [place] at that time.    Larina Earthly, M.D. Electronically Signed  TFE/MEDQ  D:  01/25/2011  T:  01/25/2011  Job:  [id number]

## 2011-02-01 ENCOUNTER — Encounter: Payer: Self-pay | Admitting: Internal Medicine

## 2011-02-02 NOTE — Procedures (Unsigned)
DUPLEX ULTRASOUND OF ABDOMINAL AORTA  INDICATION:  AAA.  HISTORY: Diabetes:  Yes. Cardiac:  No. Hypertension:  Yes. Smoking:  Yes. Connective Tissue Disorder: Family History:  No. Previous Surgery:  No.  DUPLEX EXAM:         AP (cm)                   TRANSVERSE (cm) Proximal             Not visualized            Not visualized Mid                  4.38 cm                   4.28 cm Distal               4.45 cm                   4.41 cm Right Iliac          1.59 cm                   1.47 cm Left Iliac           1.22 cm                   1.10 cm  PREVIOUS:  Date: 09/14/2010 by CT  AP:  4.6  TRANSVERSE:  IMPRESSION: 1. Abdominal aortic aneurysm noted with largest measurement of 4.45 X     4.41 cm. 2. Somewhat limited visualization due to overlying bowel gas. 3. Patient had been smoking prior to the examination.  ___________________________________________ Larina Earthly, M.D.  EM/MEDQ  D:  01/26/2011  T:  01/26/2011  Job:  [id number]

## 2011-03-14 ENCOUNTER — Telehealth: Payer: Self-pay | Admitting: *Deleted

## 2011-03-14 NOTE — Telephone Encounter (Signed)
Should be one puff twice daily

## 2011-03-14 NOTE — Telephone Encounter (Signed)
Received call from [facility] in [place] re: Advair rx. She wants to verify directions. Our directions state Advair 2 puffs twice a day. Pharmacist states the directions are usually 1 puff bid. Please advise what pt should be taking?

## 2011-03-15 NOTE — Telephone Encounter (Signed)
Clarification given to Luisa Hart at [facility] in [place].

## 2011-03-22 ENCOUNTER — Ambulatory Visit: Payer: Self-pay | Admitting: Vascular Surgery

## 2011-04-08 ENCOUNTER — Telehealth: Payer: Self-pay | Admitting: Internal Medicine

## 2011-04-08 MED ORDER — ALBUTEROL SULFATE HFA 108 (90 BASE) MCG/ACT IN AERS: 2.0000 | INHALATION_SPRAY | Freq: Four times a day (QID) | RESPIRATORY_TRACT | Status: DC | PRN

## 2011-04-08 NOTE — Telephone Encounter (Signed)
#  1 [id number]

## 2011-04-08 NOTE — Telephone Encounter (Signed)
Refill-proair inhaler (200 puffs) 8.5gm. Inhale one puff by mouth every 6 hours as needed. Qty 8.5. Last fill 6.7.11

## 2011-04-08 NOTE — Telephone Encounter (Signed)
Patient returned call regarding medication refill. He states that he has requested the refill on the inhaler, and would like to know if he could have it refilled. He states that it has helped in the past.

## 2011-04-08 NOTE — Telephone Encounter (Signed)
Rx refill sent to pharmacy. 

## 2011-04-08 NOTE — Telephone Encounter (Signed)
Call placed to patient at [phone], no answer. A detailed voice message was left for patient to return phone call regarding inhaler

## 2011-06-10 ENCOUNTER — Telehealth: Payer: Self-pay | Admitting: Internal Medicine

## 2011-06-10 LAB — DIFFERENTIAL
Basophils Absolute: 0
Lymphocytes Relative: 14
Lymphocytes Relative: 21
Lymphs Abs: 1.2
Monocytes Absolute: 0.5
Monocytes Relative: 11
Monocytes Relative: 7
Neutro Abs: 3.8
Neutro Abs: 5.4
Neutrophils Relative %: 66
Neutrophils Relative %: 79 — ABNORMAL HIGH

## 2011-06-10 LAB — BASIC METABOLIC PANEL
CO2: 33 — ABNORMAL HIGH
Calcium: 8 — ABNORMAL LOW
Chloride: 103
GFR calc Af Amer: >60
Glucose, Bld: 122 — ABNORMAL HIGH
Potassium: 3.7
Sodium: 141

## 2011-06-10 LAB — CBC
HCT: 48.2
MCV: 82.1
MCV: 83.1
Platelets: 145 — ABNORMAL LOW
Platelets: 203
RBC: 4.72
RDW: 15.4
WBC: 5.8

## 2011-06-10 LAB — URINALYSIS, ROUTINE W REFLEX MICROSCOPIC
Hgb urine dipstick: NEGATIVE
Protein, ur: NEGATIVE
Specific Gravity, Urine: >1.046 — ABNORMAL HIGH
Urobilinogen, UA: 1

## 2011-06-10 LAB — COMPREHENSIVE METABOLIC PANEL
Albumin: 3.8
BUN: 15
Creatinine, Ser: 1
Glucose, Bld: 172 — ABNORMAL HIGH
Total Bilirubin: 0.6
Total Protein: 6.8

## 2011-06-10 MED ORDER — ALBUTEROL SULFATE HFA 108 (90 BASE) MCG/ACT IN AERS: 2.0000 | INHALATION_SPRAY | Freq: Four times a day (QID) | RESPIRATORY_TRACT | Status: DC | PRN

## 2011-06-10 NOTE — Telephone Encounter (Signed)
rx sent in electroically

## 2011-06-10 NOTE — Telephone Encounter (Signed)
Refill- proair inhaler (200 puffs) 8.5gm. Inhale 2 puffs by mouth every 6 hours as needed for wheezing. Qty 8.5gm last fill 9.3.12

## 2011-07-26 ENCOUNTER — Other Ambulatory Visit: Payer: Medicare Other

## 2012-05-24 ENCOUNTER — Encounter: Payer: Self-pay | Admitting: Gastroenterology

## 2013-01-31 ENCOUNTER — Encounter: Payer: Self-pay | Admitting: Gastroenterology

## 2013-05-05 ENCOUNTER — Emergency Department (HOSPITAL_COMMUNITY)
Admission: EM | Admit: 2013-05-05 | Discharge: 2013-05-05 | Disposition: A | Discharge status: Home / Self Care | Payer: Medicare Other | Attending: Emergency Medicine | Admitting: Emergency Medicine

## 2013-05-05 ENCOUNTER — Encounter (HOSPITAL_COMMUNITY): Payer: Self-pay | Admitting: Physical Medicine and Rehabilitation

## 2013-05-05 DIAGNOSIS — Z888 Allergy status to other drugs, medicaments and biological substances status: Secondary | ICD-10-CM | POA: Insufficient documentation

## 2013-05-05 DIAGNOSIS — R300 Dysuria: Secondary | ICD-10-CM | POA: Insufficient documentation

## 2013-05-05 DIAGNOSIS — Z860100 Personal history of colon polyps, unspecified: Secondary | ICD-10-CM | POA: Insufficient documentation

## 2013-05-05 DIAGNOSIS — J449 Chronic obstructive pulmonary disease, unspecified: Secondary | ICD-10-CM | POA: Insufficient documentation

## 2013-05-05 DIAGNOSIS — Z79899 Other long term (current) drug therapy: Secondary | ICD-10-CM | POA: Insufficient documentation

## 2013-05-05 DIAGNOSIS — J4489 Other specified chronic obstructive pulmonary disease: Secondary | ICD-10-CM | POA: Insufficient documentation

## 2013-05-05 DIAGNOSIS — I4891 Unspecified atrial fibrillation: Secondary | ICD-10-CM | POA: Insufficient documentation

## 2013-05-05 DIAGNOSIS — I2510 Atherosclerotic heart disease of native coronary artery without angina pectoris: Secondary | ICD-10-CM | POA: Insufficient documentation

## 2013-05-05 DIAGNOSIS — M129 Arthropathy, unspecified: Secondary | ICD-10-CM | POA: Insufficient documentation

## 2013-05-05 DIAGNOSIS — G8929 Other chronic pain: Secondary | ICD-10-CM | POA: Insufficient documentation

## 2013-05-05 DIAGNOSIS — F17200 Nicotine dependence, unspecified, uncomplicated: Secondary | ICD-10-CM | POA: Insufficient documentation

## 2013-05-05 DIAGNOSIS — Z8719 Personal history of other diseases of the digestive system: Secondary | ICD-10-CM | POA: Insufficient documentation

## 2013-05-05 DIAGNOSIS — Z9849 Cataract extraction status, unspecified eye: Secondary | ICD-10-CM | POA: Insufficient documentation

## 2013-05-05 DIAGNOSIS — Z87442 Personal history of urinary calculi: Secondary | ICD-10-CM | POA: Insufficient documentation

## 2013-05-05 DIAGNOSIS — E785 Hyperlipidemia, unspecified: Secondary | ICD-10-CM | POA: Insufficient documentation

## 2013-05-05 DIAGNOSIS — K219 Gastro-esophageal reflux disease without esophagitis: Secondary | ICD-10-CM | POA: Insufficient documentation

## 2013-05-05 DIAGNOSIS — R112 Nausea with vomiting, unspecified: Secondary | ICD-10-CM | POA: Insufficient documentation

## 2013-05-05 DIAGNOSIS — I10 Essential (primary) hypertension: Secondary | ICD-10-CM | POA: Insufficient documentation

## 2013-05-05 DIAGNOSIS — R6882 Decreased libido: Secondary | ICD-10-CM | POA: Insufficient documentation

## 2013-05-05 DIAGNOSIS — M549 Dorsalgia, unspecified: Secondary | ICD-10-CM | POA: Insufficient documentation

## 2013-05-05 DIAGNOSIS — N410 Acute prostatitis: Secondary | ICD-10-CM | POA: Insufficient documentation

## 2013-05-05 DIAGNOSIS — R109 Unspecified abdominal pain: Secondary | ICD-10-CM | POA: Insufficient documentation

## 2013-05-05 DIAGNOSIS — R3915 Urgency of urination: Secondary | ICD-10-CM | POA: Insufficient documentation

## 2013-05-05 DIAGNOSIS — J45909 Unspecified asthma, uncomplicated: Secondary | ICD-10-CM | POA: Insufficient documentation

## 2013-05-05 DIAGNOSIS — Z85828 Personal history of other malignant neoplasm of skin: Secondary | ICD-10-CM | POA: Insufficient documentation

## 2013-05-05 LAB — CBC WITH DIFFERENTIAL/PLATELET
Basophils Relative: 1 % (ref 0–1)
Eosinophils Absolute: 0.3 10*3/uL (ref 0.0–0.7)
HCT: 42.9 % (ref 39.0–52.0)
Hemoglobin: 14.4 g/dL (ref 13.0–17.0)
Lymphs Abs: 2 10*3/uL (ref 0.7–4.0)
MCH: 27.7 pg (ref 26.0–34.0)
MCHC: 33.6 g/dL (ref 30.0–36.0)
Monocytes Absolute: 0.7 10*3/uL (ref 0.1–1.0)
Monocytes Relative: 9 % (ref 3–12)
Neutrophils Relative %: 59 % (ref 43–77)
RBC: 5.2 MIL/uL (ref 4.22–5.81)

## 2013-05-05 LAB — COMPREHENSIVE METABOLIC PANEL
Alkaline Phosphatase: 59 U/L (ref 39–117)
BUN: 10 mg/dL (ref 6–23)
Chloride: 104 mEq/L (ref 96–112)
Creatinine, Ser: 0.89 mg/dL (ref 0.50–1.35)
GFR calc Af Amer: >90 mL/min (ref >90)
Glucose, Bld: 134 mg/dL — ABNORMAL HIGH (ref 70–99)
Potassium: 3.7 mEq/L (ref 3.5–5.1)
Total Bilirubin: 0.3 mg/dL (ref 0.3–1.2)
Total Protein: 6.5 g/dL (ref 6.0–8.3)

## 2013-05-05 LAB — URINALYSIS, ROUTINE W REFLEX MICROSCOPIC
Ketones, ur: NEGATIVE mg/dL
Leukocytes, UA: NEGATIVE
Nitrite: NEGATIVE
Protein, ur: NEGATIVE mg/dL

## 2013-05-05 MED ORDER — OXYCODONE-ACETAMINOPHEN 5-325 MG PO TABS
1.0000 | ORAL_TABLET | Freq: Once | ORAL | Status: AC
  Administered 2013-05-05: 1 via ORAL
  Filled 2013-05-05: qty 1

## 2013-05-05 MED ORDER — CIPROFLOXACIN HCL 500 MG PO TABS: 500.0000 mg | ORAL_TABLET | Freq: Two times a day (BID) | ORAL | Status: DC

## 2013-05-05 NOTE — ED Notes (Signed)
Pt presents to department for evaluation of L sided flank pain. Has been straining urine at home, states he has passed several stones. Also states pain to scrotum. 8/10 pain at the time. No nausea/vomiting. Pt is alert and oriented x4.

## 2013-05-05 NOTE — ED Provider Notes (Signed)
CSN: [id number]     Arrival date & time 05/05/13  1557 History   None    Chief Complaint  Patient presents with  . Urinary Frequency  . Urinary Urgency   (Consider location/radiation/quality/duration/timing/severity/associated sxs/prior Treatment) Patient is a 72 y.o. male presenting with dysuria. The history is provided by the patient. No language interpreter was used.  Dysuria This is a new problem. The current episode started yesterday. The problem occurs constantly. The problem has been unchanged. Associated symptoms include nausea, urinary symptoms and vomiting. Pertinent negatives include no abdominal pain, chest pain, congestion, coughing, fever or sore throat. Nothing aggravates the symptoms. He has tried nothing for the symptoms. The treatment provided no relief.    Past Medical History  Diagnosis Date  . Hypertension   . COPD (chronic obstructive pulmonary disease)   . Arthritis   . Asthma   . GERD (gastroesophageal reflux disease)   . A-fib   . CAD (coronary artery disease)   . Palpitations   . Abdominal pain   . Decreased libido   . Fatigue   . Prostatitis, acute   . Hypertrophy of prostate with urinary obstruction and other lower urinary tract symptoms (LUTS)   . Adenomatous colon polyp   . Diverticulosis of colon   . Esophageal stricture   . Cataract     bilateral  . History of small bowel obstruction   . Hyperlipidemia   . Chronic LBP   . Peri-rectal abscess   . Tobacco abuse   . Personal history of renal calculi   . Cancer     skin, nose   Past Surgical History  Procedure Laterality Date  . Lumbar laminectomy      with Harrington rods  . Hernia repair    . Tonsillectomy    . Cataract extraction  04/2010    bilateral  . Penile prosthesis placement     Family History  Problem Relation Age of Onset  . Heart disease Brother     CAD  . Diabetes Brother   . COPD Brother   . Emphysema Brother    History  Substance Use Topics  . Smoking status:  Current Every Day Smoker    Types: Cigarettes  . Smokeless tobacco: Not on file  . Alcohol Use: No    Review of Systems  Constitutional: Negative for fever.  HENT: Negative for congestion, sore throat and rhinorrhea.   Respiratory: Negative for cough and shortness of breath.   Cardiovascular: Negative for chest pain.  Gastrointestinal: Positive for nausea and vomiting. Negative for abdominal pain and diarrhea.  Genitourinary: Positive for dysuria, flank pain, difficulty urinating and testicular pain. Negative for hematuria.  Neurological: Negative for syncope and light-headedness.  All other systems reviewed and are negative.    Allergies  Atorvastatin and Procaine hcl  Home Medications   Current Outpatient Rx  Name  Route  Sig  Dispense  Refill  . EXPIRED: albuterol (PROAIR HFA) 108 (90 BASE) MCG/ACT inhaler   Inhalation   Inhale 2 puffs into the lungs every 6 (six) hours as needed for wheezing.   8.5 g   1   . diltiazem (CARDIZEM CD) 300 MG 24 hr capsule   Oral   Take 300 mg by mouth daily.           Marland Kitchen esomeprazole (NEXIUM) 40 MG capsule   Oral   Take 1 capsule (40 mg total) by mouth daily.   30 capsule   3   . Fluticasone-Salmeterol (ADVAIR  DISKUS) 500-50 MCG/DOSE AEPB   Inhalation   Inhale 2 puffs into the lungs 2 (two) times daily.           Marland Kitchen glucose blood (FREESTYLE LITE) test strip      Use to test blood sugar once a day. Dx 250.00          . ipratropium-albuterol (DUONEB) 0.5-2.5 (3) MG/3ML SOLN   Nebulization   Take 3 mLs by nebulization 4 (four) times daily as needed.           . Lancets (FREESTYLE) lancets      Use to test blood sugar once a day as instructed Dx 250.00          . losartan (COZAAR) 25 MG tablet   Oral   Take 25 mg by mouth daily.           . rosuvastatin (CRESTOR) 20 MG tablet   Oral   Take 1 tablet (20 mg total) by mouth daily.   30 tablet   3    BP 140/98  Pulse 84  Temp(Src) 97.6 F (36.4 C) (Oral)   Resp 22  SpO2 96% Physical Exam  Nursing note and vitals reviewed. Constitutional: He is oriented to person, place, and time. He appears well-developed and well-nourished. No distress.  HENT:  Head: Normocephalic and atraumatic.  Eyes: EOM are normal. Pupils are equal, round, and reactive to light.  Neck: Normal range of motion. Neck supple.  Cardiovascular: Normal rate, regular rhythm, normal heart sounds and intact distal pulses.  Exam reveals no gallop and no friction rub.   No murmur heard. Pulmonary/Chest: Effort normal and breath sounds normal. No respiratory distress. He has no wheezes. He has no rales. He exhibits no tenderness.  Abdominal: Soft. Bowel sounds are normal. He exhibits no distension. There is no tenderness.  Genitourinary:  Prostate tender to palpation No testicular or penile tenderness. Normal lie of testicles normal cremasteric reflex  Musculoskeletal: Normal range of motion. He exhibits no edema and no tenderness.  Lymphadenopathy:    He has no cervical adenopathy.  Neurological: He is alert and oriented to person, place, and time.  Skin: Skin is warm. No rash noted. He is not diaphoretic.  Psychiatric: He has a normal mood and affect. His behavior is normal.    ED Course  Procedures (including critical care time) Labs Review Labs Reviewed  CBC WITH DIFFERENTIAL  COMPREHENSIVE METABOLIC PANEL  URINALYSIS, ROUTINE W REFLEX MICROSCOPIC   Imaging Review No results found.  MDM  No diagnosis found. 4:26 PM Pt is a 72 y.o. male with pertinent PMHX of HTN, COPD,afib, CAD who presents to the ED with left flank pain. Left flank pain and testicular tenderness since last night. No fevers. No hematuria. Feels like previous kidney stone. Pt had sharp pains in his left flank with radiation into his groin with associated testicular tenderness. Nausea and vomiting. Vomited 3-4 times non-bilious, non bloody emesis. Last kidney stone 2 years ago was able to pass. No recent  imaging. Denies dysuria, but notes straining to urinate and pain in his "prostate and urethra."   On exam AFVSS, abdomen soft, + left CVA tenderness. Prostate exam tenderness to palpation. Scrotal exam, normal lie, normal cremasteric reflex no testicular tenderness, low clinical suspicion for torsion, appendix torsion or epididymitis. Given tenderness concern for acute bacterial prostatitis. Will check UA, CBC, CMP. Will hold off on imaging if no blood on UA. Percocet given for pain  Review of labs: UA no  blood, no evdinece of UTI. CBC without leukocytosis or anemia. CMP: no electrolyte abnormalities. No elevated LFTS  6:11 PM: I have discussed the diagnosis/risks/treatment options with the patient and believe the pt to be eligible for discharge home to follow-up with PCP in 1 week. We also discussed returning to the ED immediately if new or worsening sx occur. We discussed the sx which are most concerning (e.g., fevers, worsening symptoms) that necessitate immediate return. Any new prescriptions provided to the patient are listed below.   Pt given pain pill and was found to not have a ride. Instructed pt that he could be arrested if caught driving after getting narcotics. Pt called and obtained ride to be discharged home with.  New Prescriptions   CIPROFLOXACIN (CIPRO) 500 MG TABLET    Take 1 tablet (500 mg total) by mouth 2 (two) times daily.    The patient appears reasonably screened and/or stabilized for discharge and I doubt any other medical condition or other [facility] requiring further screening, evaluation or treatment in the ED at this time prior to discharge . Pt in agreement with discharge plan. Return precautions given. Pt discharged VSS  Labs, EKG and imaging reviewed by myself and considered in medical decision making if ordered.  Imaging interpreted by radiology. Pt was discussed with my attending, Dr. Nicole Cella, MD 05/06/13 [phone]

## 2013-05-05 NOTE — ED Notes (Signed)
MD at bedside. 

## 2013-05-06 ENCOUNTER — Telehealth (HOSPITAL_COMMUNITY): Payer: Self-pay | Admitting: Emergency Medicine

## 2013-05-07 NOTE — ED Provider Notes (Signed)
I saw and evaluated the patient, reviewed the resident's note and I agree with the findings and plan. Pt with pain on urination, increased difficulty with urination.  +evidence of prostatitis on resident exam, no retention.  Rolan Bucco, MD 05/07/13 [phone]

## 2014-10-03 ENCOUNTER — Encounter: Payer: Self-pay | Admitting: Gastroenterology

## 2016-10-20 ENCOUNTER — Inpatient Hospital Stay
Admission: RE | Admit: 2016-10-20 | Discharge: 2016-11-07 | Disposition: A | Discharge status: Home / Self Care | Payer: Medicare Other | Source: Ambulatory Visit | Attending: Internal Medicine | Admitting: Internal Medicine

## 2016-10-21 ENCOUNTER — Encounter (HOSPITAL_COMMUNITY)
Admission: RE | Admit: 2016-10-21 | Discharge: 2016-10-21 | Disposition: A | Discharge status: Home / Self Care | Payer: Medicare Other | Source: Skilled Nursing Facility | Attending: Internal Medicine | Admitting: Internal Medicine

## 2016-10-21 LAB — CBC
HEMATOCRIT: 40.1 % (ref 39.0–52.0)
HEMOGLOBIN: 13.3 g/dL (ref 13.0–17.0)
MCH: 29.4 pg (ref 26.0–34.0)
MCHC: 33.2 g/dL (ref 30.0–36.0)
MCV: 88.5 fL (ref 78.0–100.0)
Platelets: 197 10*3/uL (ref 150–400)
RBC: 4.53 MIL/uL (ref 4.22–5.81)
RDW: 14.8 % (ref 11.5–15.5)
WBC: 7.1 10*3/uL (ref 4.0–10.5)

## 2016-10-21 LAB — BASIC METABOLIC PANEL
Anion gap: 6 (ref 5–15)
BUN: 24 mg/dL — AB (ref 6–20)
CHLORIDE: 107 mmol/L (ref 101–111)
CO2: 24 mmol/L (ref 22–32)
Calcium: 8.6 mg/dL — ABNORMAL LOW (ref 8.9–10.3)
Creatinine, Ser: 0.92 mg/dL (ref 0.61–1.24)
GFR calc Af Amer: >60 mL/min (ref >60)
GFR calc non Af Amer: >60 mL/min (ref >60)
GLUCOSE: 187 mg/dL — AB (ref 65–99)
POTASSIUM: 3.9 mmol/L (ref 3.5–5.1)
Sodium: 137 mmol/L (ref 135–145)

## 2016-10-21 LAB — LIPID PANEL
CHOL/HDL RATIO: 2.7 ratio
Cholesterol: 118 mg/dL (ref 0–200)
HDL: 43 mg/dL (ref >40)
LDL Cholesterol: 40 mg/dL (ref 0–99)
Triglycerides: 174 mg/dL — ABNORMAL HIGH (ref <150)
VLDL: 35 mg/dL (ref 0–40)

## 2016-10-22 LAB — HEMOGLOBIN A1C
Hgb A1c MFr Bld: 8.8 % — ABNORMAL HIGH (ref 4.8–5.6)
Mean Plasma Glucose: 206 mg/dL

## 2016-10-24 ENCOUNTER — Encounter: Payer: Self-pay | Admitting: Internal Medicine

## 2016-10-24 ENCOUNTER — Non-Acute Institutional Stay (SKILLED_NURSING_FACILITY): Payer: Medicare Other | Admitting: Internal Medicine

## 2016-10-24 DIAGNOSIS — I10 Essential (primary) hypertension: Secondary | ICD-10-CM

## 2016-10-24 DIAGNOSIS — I480 Paroxysmal atrial fibrillation: Secondary | ICD-10-CM

## 2016-10-24 DIAGNOSIS — E1149 Type 2 diabetes mellitus with other diabetic neurological complication: Secondary | ICD-10-CM | POA: Diagnosis not present

## 2016-10-24 DIAGNOSIS — I714 Abdominal aortic aneurysm, without rupture, unspecified: Secondary | ICD-10-CM

## 2016-10-24 DIAGNOSIS — J690 Pneumonitis due to inhalation of food and vomit: Secondary | ICD-10-CM | POA: Diagnosis not present

## 2016-10-24 DIAGNOSIS — J189 Pneumonia, unspecified organism: Secondary | ICD-10-CM | POA: Insufficient documentation

## 2016-10-24 DIAGNOSIS — J441 Chronic obstructive pulmonary disease with (acute) exacerbation: Secondary | ICD-10-CM

## 2016-10-24 NOTE — Progress Notes (Addendum)
Provider:  Veleta Miners Location:   [facility] Room Number: 128/P Place of Service:  SNF (31)  PCP: PROVIDER NOT IN SYSTEM Patient Care Team: Provider Not In System as PCP - General  Extended Emergency Contact Information Primary Emergency Contact: Sandrea Hammond, [place] Phone: [phone] Relation: Daughter Secondary Emergency Contact: Marva Panda Address: [address]          [place], [place] [address] [place] Phone: [phone] Mobile Phone: [phone] Relation: Daughter  Code Status: Full Code Goals of Care: Advanced Directive information Advanced Directives 10/24/2016  Does Patient Have a Medical Advance Directive? Yes  Type of Advance Directive (No Data)  Does patient want to make changes to medical advance directive? No - Patient declined      Chief Complaint  Patient presents with  . New Admit To SNF    HPI: Patient is a 76 y.o. 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 Hyperlipidemia, And Continued smoking.  His Daughter was in the room and gave most of the history. She says she had noticed gradual decline in him for past few weeks and took him to his Doctor who send him to the ED where he was found to have Bilateral Multifocal Pneumonia. He also had COPD exacerbation. He also on Swallowing study was found to have severe Dysphagia. 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   Patient was discharged to the facility for Physical therapy.  Patient lives by himself at home. He has some family around which helps him but he gets Meals on Wheels and drives himself to appointments.He is on Home Oxygen as needed and Continues to smoke. He still  feels SOB. Has Cough with productive sputum. No Chest pain or Fever or chills. He feels very weak and tired. Also C/O Left hip pain. He says he has lost almost 10 lbs in past few weeks.  Past Medical History:  Diagnosis Date  . A-fib ([place])   . Abdominal pain   . Adenomatous colon polyp   . Arthritis   . Asthma   . CAD (coronary artery disease)   . Cancer ([place])    skin, nose  . Cataract    bilateral  . Chronic LBP   . COPD (chronic obstructive pulmonary disease) ([place])   . Decreased libido   . Diverticulosis of colon   . Esophageal stricture   . Fatigue   . GERD (gastroesophageal reflux disease)   . History of small bowel obstruction   . Hyperlipidemia   . Hypertension   . Hypertrophy of prostate with urinary obstruction and other lower urinary tract symptoms (LUTS)   . Palpitations   . Peri-rectal abscess   . Personal history of renal calculi   . Prostatitis, acute   . Tobacco abuse    Past Surgical History:  Procedure Laterality Date  . CATARACT EXTRACTION  04/2010   bilateral  . HERNIA REPAIR    . LUMBAR LAMINECTOMY     with Harrington rods  . PENILE PROSTHESIS PLACEMENT    . TONSILLECTOMY      reports that he has been smoking Cigarettes.  He has never used smokeless tobacco. He reports that he does not  drink alcohol or use drugs. Social History   Social History  . Marital status: Divorced    Spouse name: N/A  . Number of children: N/A  . Years of education: N/A   Occupational History  . Retired Retired   Social History Main Topics  . Smoking status: Current Every Day Smoker    Types: Cigarettes  . Smokeless tobacco: Never Used  . Alcohol use No  . Drug use: No  . Sexual activity: Not on file   Other Topics Concern  . Not on file   Social History Narrative  . No narrative on file    Functional Status Survey:    Family History  Problem Relation Age of Onset  . Heart disease Brother     CAD  . Diabetes Brother   . COPD Brother   . Emphysema  Brother     Health Maintenance  Topic Date Due  . FOOT EXAM  11/28/1950  . OPHTHALMOLOGY EXAM  11/28/1950  . TETANUS/TDAP  11/28/1957  . URINE MICROALBUMIN  05/18/2011  . INFLUENZA VACCINE  08/05/2017 (Originally 04/05/2016)  . PNA vac Low Risk Adult (1 of 2 - PCV13) 08/05/2017 (Originally 11/29/2003)  . HEMOGLOBIN A1C  04/20/2017    Allergies  Allergen Reactions  . Procaine Other (See Comments) and Shortness Of Breath    Syncope  . Procaine Hcl Anaphylaxis and Swelling  . Atorvastatin Palpitations    REACTION: Heartburn    Current Outpatient Prescriptions on File Prior to Visit  Medication Sig Dispense Refill  . albuterol (PROVENTIL HFA;VENTOLIN HFA) 108 (90 BASE) MCG/ACT inhaler Inhale 2 puffs into the lungs every 4 (four) hours as needed for wheezing.     Marland Kitchen ipratropium-albuterol (DUONEB) 0.5-2.5 (3) MG/3ML SOLN Take 3 mLs by nebulization QID.     Marland Kitchen rosuvastatin (CRESTOR) 20 MG tablet Take 1 tablet (20 mg total) by mouth daily. 30 tablet 3   No current facility-administered medications on file prior to visit.     Review of Systems  Constitutional: Positive for activity change, fatigue and unexpected weight change. Negative for appetite change, chills and fever.  HENT: Negative.   Respiratory: Positive for apnea, cough and shortness of breath.   Cardiovascular: Negative for chest pain, palpitations and leg swelling.  Gastrointestinal: Positive for abdominal distention and constipation. Negative for abdominal pain, diarrhea and nausea.  Genitourinary: Negative for dysuria and frequency.  Musculoskeletal: Positive for back pain and joint swelling.  Skin: Positive for wound. Negative for pallor and rash.  Neurological: Positive for tremors and weakness. Negative for dizziness, syncope, light-headedness and numbness.  Psychiatric/Behavioral: Negative.     Vitals:   10/24/16 1038  BP: 115/67  Pulse: 69  Resp: (!) 24  Temp: 97.5 F (36.4 C)  TempSrc: Oral  SpO2: 97%    There is no height or weight on file to calculate BMI. Physical Exam  Constitutional: He is oriented to person, place, and time. He appears well-developed.  HENT:  Head: Normocephalic.  Mouth/Throat: Oropharynx is clear and moist.  Eyes: Pupils are equal, round, and reactive to light.  Neck: Neck supple.  Cardiovascular: Normal rate, regular rhythm and normal heart sounds.   Pulmonary/Chest:  Patient is not moving air well. But no wheezing. He also looks SOB but his Sats are good on RA. And per his Daughter he is always like this.  Abdominal: Soft. Bowel sounds are normal. He exhibits distension. There is no tenderness.  Has distended abdomen but soft and not tender. Positive for Umbilical  hernia  Musculoskeletal: He exhibits no edema.  Lymphadenopathy:    He has no cervical adenopathy.  Neurological: He is alert and oriented to person, place, and time.  Had good strength In UE 5/5 Had 4/5 in LE  Skin:  Has some wounds in his Nose which are following with dermatology for Excision.  Psychiatric: He has a normal mood and affect. His behavior is normal. Judgment and thought content normal.    Labs reviewed: Basic Metabolic Panel:  Recent Labs  10/21/16 0700  NA 137  K 3.9  CL 107  CO2 24  GLUCOSE 187*  BUN 24*  CREATININE 0.92  CALCIUM 8.6*   Liver Function Tests: No results for input(s): AST, ALT, ALKPHOS, BILITOT, PROT, ALBUMIN in the last 8760 hours. No results for input(s): LIPASE, AMYLASE in the last 8760 hours. No results for input(s): AMMONIA in the last 8760 hours. CBC:  Recent Labs  10/21/16 0700  WBC 7.1  HGB 13.3  HCT 40.1  MCV 88.5  PLT 197   Cardiac Enzymes: No results for input(s): CKTOTAL, CKMB, CKMBINDEX, TROPONINI in the last 8760 hours. BNP: Invalid input(s): POCBNP Lab Results  Component Value Date   HGBA1C 8.8 (H) 10/21/2016   Lab Results  Component Value Date   TSH 2.583 04/09/2010   No results found for: VITAMINB12 No results  found for: FOLATE No results found for: IRON, TIBC, FERRITIN  Imaging and Procedures obtained prior to SNF admission: No results found.  Assessment/Plan  Multifocal Pneumonia possible aspiration  He is On Omnicef now for 8 more days with Total of 10 days. Patient is afebrile. Will recheck and follow White count.  COPD Exacerbation Continue Oxygen supplement as needed if PO less then 90 % On Prednisone for 3 days Also continue Duo neb. Patient continues to smoke and has decided not to Quit or do anything abiut it, Type 2 diabetes mellitus  His last HbA1c is 8.8 in 02/18 Most likely is not compliant for treatment Continue Accu check here with BS are mostly running b/w 250-150. On Oral Hypoglycemics Will Continue accu check and sliding scale  Essential hypertension On Cardizem. BP controlled.  Paroxysmal atrial fibrillation (HCC) It does not look like patient is on Any therapy except Aspirin. Family says he has not seen Cardiologist before. With 2 Stroke on aspirin patient needs to be on Anticoagulation.   Abdominal aortic aneurysm (AAA) without rupture [facility]) It seems he is not surgical candidate and follows with Vascular surgeon. Last visit in 10/17. Plan is to continue Monitoring as patient also has Severe PAD  Severe dysphagia D/W Speech therapist and daughter. Patient has severe dysphagia in Swallowing study in [facility]ops[facility]. They recommend PEG tube But at this time he wants to work with the therapist and repeating Swallowing study. They do want to consider Tube for short interval of time if needed Back pain chronic Will start on low dose of ultram He is also on Neurotin.   Family/ staff Communication: Discussed in detail with patients daughter. He wants to be Full code right now but has been non compliant with treatment before. With Continue smoking and PAD and severe COPD his prognosis stays guarded.  Labs/tests ordered: CBC with diff, CMP,TSH Total time spent in this  patient care encounter was 60_ minutes; greater than 50% of the visit spent counseling patient and coordinating care for problems addressed at this encounter.

## 2016-10-25 ENCOUNTER — Encounter (HOSPITAL_COMMUNITY)
Admission: RE | Admit: 2016-10-25 | Discharge: 2016-10-25 | Disposition: A | Discharge status: Home / Self Care | Payer: Medicare Other | Source: Skilled Nursing Facility | Attending: Internal Medicine | Admitting: Internal Medicine

## 2016-10-25 ENCOUNTER — Other Ambulatory Visit (HOSPITAL_COMMUNITY): Payer: Self-pay | Admitting: Specialist

## 2016-10-25 DIAGNOSIS — K219 Gastro-esophageal reflux disease without esophagitis: Secondary | ICD-10-CM | POA: Insufficient documentation

## 2016-10-25 DIAGNOSIS — Z7984 Long term (current) use of oral hypoglycemic drugs: Secondary | ICD-10-CM | POA: Insufficient documentation

## 2016-10-25 DIAGNOSIS — J449 Chronic obstructive pulmonary disease, unspecified: Secondary | ICD-10-CM | POA: Insufficient documentation

## 2016-10-25 DIAGNOSIS — R1310 Dysphagia, unspecified: Secondary | ICD-10-CM

## 2016-10-25 DIAGNOSIS — J189 Pneumonia, unspecified organism: Secondary | ICD-10-CM | POA: Insufficient documentation

## 2016-10-25 LAB — COMPREHENSIVE METABOLIC PANEL
ALT: 21 U/L (ref 17–63)
AST: 13 U/L — AB (ref 15–41)
Albumin: 3.1 g/dL — ABNORMAL LOW (ref 3.5–5.0)
Alkaline Phosphatase: 64 U/L (ref 38–126)
Anion gap: 9 (ref 5–15)
BUN: 19 mg/dL (ref 6–20)
CO2: 25 mmol/L (ref 22–32)
CREATININE: 0.77 mg/dL (ref 0.61–1.24)
Calcium: 8.6 mg/dL — ABNORMAL LOW (ref 8.9–10.3)
Chloride: 103 mmol/L (ref 101–111)
GFR calc Af Amer: >60 mL/min (ref >60)
GFR calc non Af Amer: >60 mL/min (ref >60)
GLUCOSE: 154 mg/dL — AB (ref 65–99)
Potassium: 4.1 mmol/L (ref 3.5–5.1)
SODIUM: 137 mmol/L (ref 135–145)
Total Bilirubin: 0.4 mg/dL (ref 0.3–1.2)
Total Protein: 6 g/dL — ABNORMAL LOW (ref 6.5–8.1)

## 2016-10-25 LAB — CBC WITH DIFFERENTIAL/PLATELET
BASOS PCT: 0 %
Basophils Absolute: 0 10*3/uL (ref 0.0–0.1)
EOS ABS: 0.2 10*3/uL (ref 0.0–0.7)
Eosinophils Relative: 1 %
HEMATOCRIT: 42.3 % (ref 39.0–52.0)
HEMOGLOBIN: 14.1 g/dL (ref 13.0–17.0)
LYMPHS ABS: 1.5 10*3/uL (ref 0.7–4.0)
Lymphocytes Relative: 12 %
MCH: 29.6 pg (ref 26.0–34.0)
MCHC: 33.3 g/dL (ref 30.0–36.0)
MCV: 88.9 fL (ref 78.0–100.0)
Monocytes Absolute: 0.9 10*3/uL (ref 0.1–1.0)
Monocytes Relative: 7 %
NEUTROS ABS: 9.9 10*3/uL — AB (ref 1.7–7.7)
NEUTROS PCT: 80 %
Platelets: 200 10*3/uL (ref 150–400)
RBC: 4.76 MIL/uL (ref 4.22–5.81)
RDW: 15 % (ref 11.5–15.5)
WBC: 12.5 10*3/uL — AB (ref 4.0–10.5)

## 2016-10-25 LAB — TSH: TSH: 3.27 u[IU]/mL (ref 0.350–4.500)

## 2016-10-31 ENCOUNTER — Ambulatory Visit (HOSPITAL_COMMUNITY)
Admit: 2016-10-31 | Discharge: 2016-10-31 | Disposition: A | Discharge status: Home / Self Care | Payer: Medicare Other | Attending: Internal Medicine | Admitting: Internal Medicine

## 2016-10-31 ENCOUNTER — Ambulatory Visit (HOSPITAL_COMMUNITY): Payer: Medicare Other | Attending: Internal Medicine | Admitting: Speech Pathology

## 2016-10-31 ENCOUNTER — Encounter (HOSPITAL_COMMUNITY): Payer: Self-pay | Admitting: Speech Pathology

## 2016-10-31 DIAGNOSIS — R1310 Dysphagia, unspecified: Secondary | ICD-10-CM | POA: Insufficient documentation

## 2016-10-31 DIAGNOSIS — R1312 Dysphagia, oropharyngeal phase: Secondary | ICD-10-CM | POA: Insufficient documentation

## 2016-10-31 NOTE — Therapy (Signed)
[place], [place], [address] Phone: [phone]   Fax:  [phone]  Modified Barium Swallow  Patient Details  Name: Andrew [name] MRN: [id number] Date of Birth: 07/11/1941 No Data Recorded  Encounter Date: 10/31/2016      End of Session - 10/31/16 2031    Visit Number 1   Number of Visits 1   Authorization Type Medicare   SLP Start Time [id number]   SLP Stop Time  1425   SLP Time Calculation (min) 35 min   Activity Tolerance Patient tolerated treatment well      Past Medical History:  Diagnosis Date  . A-fib ([place])   . Abdominal pain   . Adenomatous colon polyp   . Arthritis   . Asthma   . CAD (coronary artery disease)   . Cancer ([place])    skin, nose  . Cataract    bilateral  . Chronic LBP   . COPD (chronic obstructive pulmonary disease) ([place])   . Decreased libido   . Diverticulosis of colon   . Esophageal stricture   . Fatigue   . GERD (gastroesophageal reflux disease)   . History of small bowel obstruction   . Hyperlipidemia   . Hypertension   . Hypertrophy of prostate with urinary obstruction and other lower urinary tract symptoms (LUTS)   . Palpitations   . Peri-rectal abscess   . Personal history of renal calculi   . Prostatitis, acute   . Tobacco abuse     Past Surgical History:  Procedure Laterality Date  . CATARACT EXTRACTION  04/2010   bilateral  . HERNIA REPAIR    . LUMBAR LAMINECTOMY     with Harrington rods  . PENILE PROSTHESIS PLACEMENT    . TONSILLECTOMY      There were no vitals filed for this visit.      Subjective Assessment - 10/31/16 2024    Subjective "I want to watch this test."   Patient is accompained by: Family member   Special Tests MBSS   Currently in Pain? No/denies             General - 10/31/16 2025      General Information   Date of Onset 10/06/16  approximation   HPI Andrew [name] is a 76 yo man who is currently residing at [facility] for  rehabilitation following a hospitalization in [place]. [place], [place]. Pt with h/o Hypertension, Type 2 Diabetes, Severe COPD on Home oxygen, AAA Doppler Done in 10/17 showed Infrarenal Abdominal Aortic aneurysm of 6.1 cm. Hyperlipidemia, And Continued smoking. His daughter took him to his doctor who sent him to the ED where he was found to have Bilateral Multifocal Pneumonia. He also had COPD exacerbation. He reportedly had MBSS at the hospital in [place]. [place], which showed severe oropharyngeal dysphagia. 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 Dr. Veleta Miners referred Andrew [name] for MBSS to objectively evaluate swallow. He is being followed by SLP at [facility]     Type of Study MBS-Modified Barium Swallow Study   Previous Swallow Assessment Pt reportedly had MBSS in [place]. [place]   Diet Prior to this Study Regular;Thin liquids   Temperature Spikes Noted No   Respiratory Status Room air   History of Recent Intubation No   Behavior/Cognition Alert;Cooperative;Pleasant mood   Oral Cavity Assessment Within Functional Limits   Oral Care Completed by SLP No  Oral Cavity - Dentition Dentures, top   Vision Functional for self feeding   Self-Feeding Abilities Able to feed self   Patient Positioning Upright in chair   Baseline Vocal Quality Normal   Volitional Cough Strong   Volitional Swallow Able to elicit   Anatomy Within functional limits   Pharyngeal Secretions Not observed secondary MBS            Oral Preparation/Oral Phase - 10/31/16 2028      Oral Preparation/Oral Phase   Oral Phase Impaired     Oral - Solids   Oral - Regular Weak ligual manipulation;Delayed A-P transit     Electrical stimulation - Oral Phase   Was Electrical Stimulation Used No          Pharyngeal Phase - 10/31/16 2029      Pharyngeal Phase   Pharyngeal Phase Impaired     Pharyngeal - Nectar   Pharyngeal- Nectar Cup Swallow initiation at  vallecula;Delayed swallow initiation;Reduced pharyngeal peristalsis;Reduced epiglottic inversion;Penetration/Aspiration during swallow;Pharyngeal residue - valleculae   Pharyngeal Material enters airway, remains ABOVE vocal cords and not ejected out     Pharyngeal - Thin   Pharyngeal- Thin Teaspoon Swallow initiation at vallecula;Reduced epiglottic inversion   Pharyngeal- Thin Cup Swallow initiation at pyriform sinus;Reduced epiglottic inversion;Penetration/Aspiration during swallow;Pharyngeal residue - valleculae   Pharyngeal Material enters airway, remains ABOVE vocal cords and not ejected out;Material enters airway, remains ABOVE vocal cords then ejected out   Pharyngeal- Thin Straw Swallow initiation at pyriform sinus;Reduced epiglottic inversion;Penetration/Aspiration during swallow;Pharyngeal residue - valleculae   Pharyngeal Material enters airway, remains ABOVE vocal cords then ejected out;Material enters airway, remains ABOVE vocal cords and not ejected out     Pharyngeal - Solids   Pharyngeal- Puree Delayed swallow initiation;Swallow initiation at vallecula   Pharyngeal- Regular Delayed swallow initiation-pyriform sinuses   Pharyngeal- Pill --  penetration of thins when taking pill     Pharyngeal Phase - Comment   Pharyngeal Comment Decreased epiglottic deflection     Electrical Stimulation - Pharyngeal Phase   Was Electrical Stimulation Used No          Cricopharyngeal Phase - 10/31/16 2029      Cervical Esophageal Phase   Cervical Esophageal Phase Within functional limits           Plan - 10/31/16 2032    Clinical Impression Statement Pt assessed with barium tinged thin, puree, regular, barium tablet, and nectar-thick liquids in the lateral position for MBSS. His daughter and treating SLP were present for the evaluation. Pt visibly short of breath throughout the evaluation. Pt demonstrates mild/mod oral phase dysphagia characterized by delayed oral transit with dry  solids likely negatively impacted by xerostomia and mouth breathing with shortness of breath; mild pharyngeal phase dysphagia characterized by premature spillage of liquids over base of tongue with swallow trigger at the level of the pyriforms for thin and nectar and decreased epiglottic deflection resulting in penetration into laryngeal vestibule (variable trace to min amount) with thins and nectars during the swallow. Pt penetrated a greater amount when taking straw sips thin, nectars, and when taking pill with thin (almost to cords), however no aspiration was observed throughout the study. Pt did appear to sense penetration when it was in a greater amount as evidenced by throat clear. Pt with significant delay in swallow initiation with solids, taking ~20 seconds to move from oral cavity to swallow trigger with head of bolus in pyriforms (appeared to be loose particles). Pt with no  significant pharyngeal residues post swallow across textures/consistencies except slightly greater with nectars. Esophageal sweep revealed adequate/timely transfer of barium tablet to stomach. Recommend D3/mech soft (encourage moist textures) and thin liquids. Pt should clear throat/cough periodically to ensure clearance of liquids from laryngeal vestibule and repeat/dry swallow. Pt would likely benefit from ongoing education regarding risks for aspiration and compensatory strategies. MBSS reviewed with pt and daughter.   Consulted and Agree with Plan of Care Patient;Family member/caregiver      Patient will benefit from skilled therapeutic intervention in order to improve the following deficits and impairments:   Dysphagia, oropharyngeal phase      G-Codes - Nov 06, 2016 2033    Functional Assessment Tool Used clinical judgment; MBSS   Functional Limitations Swallowing   Swallow Current Status [id number]) At least 20 percent but less than 40 percent impaired, limited or restricted   Swallow Goal Status [id number]) At least 20  percent but less than 40 percent impaired, limited or restricted   Swallow Discharge Status [phone]) At least 20 percent but less than 40 percent impaired, limited or restricted          Recommendations/Treatment - 11-06-16 2030      Swallow Evaluation Recommendations   SLP Diet Recommendations Dysphagia 3 (mechanical soft);Age appropriate regular   Liquid Administration via Cup   Medication Administration Whole meds with liquid   Supervision Patient able to self feed;Intermittent supervision to cue for compensatory strategies   Compensations Clear throat intermittently;Multiple dry swallows after each bite/sip   Postural Changes Seated upright at 90 degrees;Remain upright for at least 30 minutes after feeds/meals          Prognosis - Nov 06, 2016 2030      Prognosis   Prognosis for Safe Diet [place]ment [place]     Individuals Consulted   Consulted and Agree with Results and Recommendations Patient;Family member/caregiver   Family Member Consulted daughter   Report Sent to  Referring physician;Facility (Comment)  [facility]      Problem List Patient Active Problem List   Diagnosis Date Noted  . Pneumonia 10/24/2016  . AAA (abdominal aortic aneurysm) ([place]) 01/04/2011  . Dizziness, nonspecific 12/26/2010  . UNSPECIFIED PERIPHERAL VASCULAR DISEASE 09/23/2010  . LEG CRAMPS 09/13/2010  . Diabetes mellitus ([place]) 02/11/2010  . CHRONIC OBSTRUCTIVE PULMONARY DISEASE, ACUTE EXACERBATION 02/11/2010  . OLECRANON BURSITIS, RIGHT 01/26/2010  . PENILE PAIN 12/29/2009  . ABNORMAL EJACULATION 05/05/2009  . LARYNGITIS, ACUTE 04/21/2009  . ATRIAL FIBRILLATION 04/08/2009  . PALPITATIONS 04/08/2009  . FATIGUE 03/31/2009  . LIBIDO, DECREASED 03/31/2009  . HYPERTROPHY PROSTATE W/UR OBST & OTH LUTS 03/25/2009  . ACUTE PROSTATITIS 03/25/2009  . SMALL BOWEL OBSTRUCTION, HX OF 11/28/2007  . HYPERLIPIDEMIA 11/13/2007  . COPD 10/02/2007  . LOW BACK PAIN, CHRONIC 10/02/2007  . ADENOMATOUS COLONIC POLYP  05/22/2007  . ESOPHAGEAL STRICTURE 05/22/2007  . DIVERTICULOSIS, COLON 05/22/2007  . TOBACCO ABUSE 05/08/2007  . Essential hypertension 05/08/2007  . CORONARY ARTERY DISEASE 05/08/2007  . ASTHMA 05/08/2007  . GERD 05/08/2007  . RENAL CALCULUS, HX OF 05/08/2007  . ABSCESS, PERIRECTAL, HX OF 05/08/2007  . ARTHRITIS, HX OF 05/08/2007  . PENILE PROSTHESIS 05/08/2007  . LAMINECTOMY, LUMBAR, HX OF 05/08/2007   Thank you,  Genene Churn, [place]  [facility] 2016-11-06, 4:34 PM  [facility] [address] [place], [place], [address] Phone: [phone]   Fax:  [phone]  Name: Andrew [name] MRN: [id number] Date of Birth: 10/23/1940

## 2016-11-06 ENCOUNTER — Non-Acute Institutional Stay (SKILLED_NURSING_FACILITY): Payer: Medicare Other | Admitting: Internal Medicine

## 2016-11-06 DIAGNOSIS — J690 Pneumonitis due to inhalation of food and vomit: Secondary | ICD-10-CM | POA: Diagnosis not present

## 2016-11-06 DIAGNOSIS — I480 Paroxysmal atrial fibrillation: Secondary | ICD-10-CM

## 2016-11-06 DIAGNOSIS — J441 Chronic obstructive pulmonary disease with (acute) exacerbation: Secondary | ICD-10-CM | POA: Diagnosis not present

## 2016-11-06 DIAGNOSIS — E1149 Type 2 diabetes mellitus with other diabetic neurological complication: Secondary | ICD-10-CM | POA: Diagnosis not present

## 2016-11-06 NOTE — Progress Notes (Signed)
This is a discharge note.  Level care skilled.  Facility is [facility].  Chief complaint discharge note.  History of present illness.  Patient is a 76 year old male who has been here for rehabilitation.  He was recently hospitalized for increased weakness and was found to have bilateral multifocal pneumonia.  He also had a COPD exasperation.  He was also found to have severe dysphagia on swallowing study.  Head CT showed a left caudate Lacunar infarct--age indeterminate and old right basal ganglia infarct.  He apparently has done fairly well with physical therapies ambulating with a rolling walker and ad lib.  He says he lives in a RV-he says he does well with this.  He gets Meals on Wheels.  .  He says his breathing has improved as well as cough.  He denies any fever or chills he appears to have gained strength.  Regards to other medical issues these appear to be relatively stable he is a type II diabetic but sugars appear to be mainly in the lower mid 100s his A1c was 8.8 on February 18 I suspect this reflects somewhat poor prior control. He is on numerous oral agents     Regards atrial fibrillation he is on aspirin as well as diltiazem for rate control this appears to be rate controlled.  Blood pressures appear to be stable as well with systolics mainly in the [id number] range.  Past Medical History:  Diagnosis Date  . A-fib ([place])   . Abdominal pain   . Adenomatous colon polyp   . Arthritis   . Asthma   . CAD (coronary artery disease)   . Cancer ([place])    skin, nose  . Cataract    bilateral  . Chronic LBP   . COPD (chronic obstructive pulmonary disease) ([place])   . Decreased libido   . Diverticulosis of colon   . Esophageal stricture   . Fatigue   . GERD (gastroesophageal reflux disease)   . History of small bowel obstruction   . Hyperlipidemia   . Hypertension   . Hypertrophy of prostate with urinary obstruction and other lower  urinary tract symptoms (LUTS)   . Palpitations   . Peri-rectal abscess   . Personal history of renal calculi   . Prostatitis, acute   . Tobacco abuse         Past Surgical History:  Procedure Laterality Date  . CATARACT EXTRACTION  04/2010   bilateral  . HERNIA REPAIR    . LUMBAR LAMINECTOMY     with Harrington rods  . PENILE PROSTHESIS PLACEMENT    . TONSILLECTOMY      reports that he has been smoking Cigarettes.  He has never used smokeless tobacco. He reports that he does not drink alcohol or use drugs. Social History   Social History  . Marital status: Divorced    Spouse name: N/A  . Number of children: N/A  . Years of education: N/A       Occupational History  . Retired Retired        Social History Main Topics  . Smoking status: Current Every Day Smoker    Types: Cigarettes  . Smokeless tobacco: Never Used  . Alcohol use No  . Drug use: No  . Sexual activity: Not on file       Other Topics Concern  . Not on file      Social History Narrative  . No narrative on file    Functional Status Survey:  Family History  Problem Relation Age of Onset  . Heart disease Brother     CAD  . Diabetes Brother   . COPD Brother   . Emphysema Brother         Health Maintenance  Topic Date Due  . FOOT EXAM  11/28/1948  . OPHTHALMOLOGY EXAM  11/28/1948  . TETANUS/TDAP  11/28/1957  . URINE MICROALBUMIN  05/18/2011  . INFLUENZA VACCINE  08/05/2017 (Originally 04/05/2016)  . PNA vac Low Risk Adult (1 of 2 - PCV13) 08/05/2017 (Originally 11/29/2003)  . HEMOGLOBIN A1C  04/20/2017         Allergies  Allergen Reactions  . Procaine Other (See Comments) and Shortness Of Breath    Syncope  . Procaine Hcl Anaphylaxis and Swelling  . Atorvastatin Palpitations    REACTION: Heartburn    Medications include.  Albuterol nebulizers every 4 hours when necessary.  Anora Ellipta once a day.  Aspirin 325 mg  daily.  Crestor 20 mg daily at bedtime.  Diltiazem 24-hour 240 mg every morning.  Lasix 20 mg a day.  Neurontin 400 mg 3 times a day.  Glipizide 24-hour 5 mg daily.  DuoNeb nebulizers every 6 hours.  Glucophage thousand milligrams every morning.  NovoLog sliding scale.  Prilosec 40 mg daily.  Onglyza 5 mg every morning.  Proventil inhaler 2 puffs every 4 hours when necessary.  Singulair daily.  10 mg.  Tylenol 650 mg every 4 hours when necessary.  Tramadol 50 mg every 6 hours when necessary.  Vitamin B12 2500 g sublingually once a day.         Current Outpatient Prescriptions on File Prior to Visit  Medication Sig Dispense Refill  .       .      .    3   No current facility-administered medications on file prior to visit.     Review of Systems   In general is not complaining of any fever or chills.  Skin does not clean of rashes or itching.  Head ears eyes nose mouth and throat does not complaining of visual changes or sore throat.  Respiratory does have a history of significant COPD but says his breathing has improved as well as cough has improved.  Cardiac does not complaining of chest pain or palpitations.  GU does not complain of dysuria.  GI does not complain of abdominal pain nausea vomiting diarrhea constipation he does have an umbilical hernia.  Muscle skeletal says he still feels somewhat weak but has gained strength does not complain of joint pain currently.  Neurologic is not complaining of dizziness headache or syncope.  Psych does not complain of overt anxiety or depression    Temp is 97.6 pulse 73 respirations 24 blood pressure 138/87 weight is stable at 182.8   Physical Exam  Constitutional: He is oriented to person, place, and time. He appears well-developed.  HENT:  Head: Normocephalic.  Mouth/Throat: Oropharynx is clear and moist.  Eyes: Pupils are equal, round, and reactive to light.  Neck: Neck supple.   Cardiovascular: Normal rate, regular rhythm and normal heart sounds.   Pulmonary/Chest:   shallow air entry with some bronchial sounds there is no labored breathing or any sign of distress appears to be able to ambulate without significant respiratory discomfort .  Abdominal: Soft. Bowel sounds are normal. He exhibits distension. There is no tenderness.  Has distended abdomen but soft and not tender. Positive for Umbilical hernia  Musculoskeletal: He exhibits no edema.--He is  able to ambulate ad lib. as well with a walker appears strength is preserved all 4 extremities   Neurological: He is alert and oriented to person, place, and time.  Had good strength In UE 5/5 Had 4/5 in LE   .  Psychiatric: He has a normal mood and affect. His behavior is normal. Judgment and thought content normal.    Labs reviewed:   10/25/2016.  Sodium 137 potassium 4.1 BUN 19 creatinine 0.77.  Albumin 3.1-AST 13 otherwise liver function tests within normal limits.  TSH-3.27.  WBC 12.5 hemoglobin 14.1 platelets 200,000.   Basic Metabolic Panel:  Recent Labs (within last 365 days)   Recent Labs  10/21/16 0700  NA 137  K 3.9  CL 107  CO2 24  GLUCOSE 187*  BUN 24*  CREATININE 0.92  CALCIUM 8.6*     Liver Function Tests: Recent Labs (within last 365 days)  No results for input(s): AST, ALT, ALKPHOS, BILITOT, PROT, ALBUMIN in the last 8760 hours.   Recent Labs (within last 365 days)  No results for input(s): LIPASE, AMYLASE in the last 8760 hours.   Recent Labs (within last 365 days)  No results for input(s): AMMONIA in the last 8760 hours.   CBC:  Recent Labs (within last 365 days)   Recent Labs  10/21/16 0700  WBC 7.1  HGB 13.3  HCT 40.1  MCV 88.5  PLT 197     Cardiac Enzymes: Recent Labs (within last 365 days)  No results for input(s): CKTOTAL, CKMB, CKMBINDEX, TROPONINI in the last 8760 hours.   BNP: Recent Labs (within last 365 days)  Invalid input(s):  POCBNP   Recent Labs       Lab Results  Component Value Date   HGBA1C 8.8 (H) 10/21/2016     Recent Labs       Lab Results  Component Value Date   TSH 2.583 04/09/2010     Recent Labs  No results found for: VITAMINB12   Recent Labs  No results found for: FOLATE   Recent Labs  No results found for: IRON, TIBC, FERRITIN    Imaging and Procedures obtained prior to SNF admission: No results found.  Assessment/Plan  Multifocal Pneumonia possible aspiration  He has completed antibiotics he is afebrile appears to be feeling stronger says his breathing is better-will update labs to follow white count although he does not show signs of infection   COPD Exacerbation Continue Oxygen supplement as needed if PO less then 90 % O He has completed a course of prednisone continues on duo nebs routinely as well as albuterol when necessary also continues on  Anora Ellipta--Proventil-as well as Singulair Type 2 diabetes mellitus  His last HbA1c is 8.8 in 02/18 Most likely is not compliant for treatment  CBGs here appear fairly consistent in the lower 100s mid 100s-he is on numerous oral agents including Glucophage-glipizide- Onglyzae  Essential hypertension On Cardizem. BP controlled. As noted above systolics largely in the [id number] range  Paroxysmal atrial fibrillation (HCC) This appears rate controlled on diltiazem with pulses in the 60-70 range-he is on aspirin for anticoagulation   Abdominal aortic aneurysm (AAA) without rupture [facility]) It seems he is not surgical candidate and follows with Vascular surgeon. Last visit in 10/17. Plan is to continue Monitoring as patient also has Severe PAD  Severe dysphagia  Per nursing this has improved-apparently he is on a regular diet-and has been followed by speech therapy-actually he goes out to eat at times Back pain  chronic He has been started on tramadol when necessary He is also on Neurotin.  Patient apparently does  live alone he will need close follow-up he will be seen at the [place] who does all his home health and as well as M.D. follow-up and medications.  He will need a rolling walker.  Also will update a CBC with differential and metabolic panel tomorrow with follow-up by his [place].  [id number] note greater than 30 minutes spent on this discharge summary-greater than 50% of time spent coordinating plan of care for numerous diagnoses

## 2016-11-07 ENCOUNTER — Encounter (HOSPITAL_COMMUNITY)
Admission: RE | Admit: 2016-11-07 | Discharge: 2016-11-07 | Disposition: A | Discharge status: Home / Self Care | Payer: Medicare Other | Source: Skilled Nursing Facility | Attending: Internal Medicine | Admitting: Internal Medicine

## 2016-11-07 LAB — BASIC METABOLIC PANEL
Anion gap: 6 (ref 5–15)
BUN: 13 mg/dL (ref 6–20)
CALCIUM: 8.9 mg/dL (ref 8.9–10.3)
CO2: 27 mmol/L (ref 22–32)
CREATININE: 0.74 mg/dL (ref 0.61–1.24)
Chloride: 105 mmol/L (ref 101–111)
GFR calc Af Amer: >60 mL/min (ref >60)
GLUCOSE: 133 mg/dL — AB (ref 65–99)
POTASSIUM: 4.1 mmol/L (ref 3.5–5.1)
SODIUM: 138 mmol/L (ref 135–145)

## 2016-11-07 LAB — CBC WITH DIFFERENTIAL/PLATELET
Basophils Absolute: 0 10*3/uL (ref 0.0–0.1)
Basophils Relative: 0 %
EOS ABS: 0.1 10*3/uL (ref 0.0–0.7)
Eosinophils Relative: 3 %
HCT: 37.3 % — ABNORMAL LOW (ref 39.0–52.0)
HEMOGLOBIN: 12.4 g/dL — AB (ref 13.0–17.0)
LYMPHS ABS: 1.3 10*3/uL (ref 0.7–4.0)
LYMPHS PCT: 27 %
MCH: 29.2 pg (ref 26.0–34.0)
MCHC: 33.2 g/dL (ref 30.0–36.0)
MCV: 87.8 fL (ref 78.0–100.0)
Monocytes Absolute: 0.3 10*3/uL (ref 0.1–1.0)
Monocytes Relative: 7 %
NEUTROS ABS: 3.1 10*3/uL (ref 1.7–7.7)
NEUTROS PCT: 63 %
Platelets: 161 10*3/uL (ref 150–400)
RBC: 4.25 MIL/uL (ref 4.22–5.81)
RDW: 14.8 % (ref 11.5–15.5)
WBC: 4.9 10*3/uL (ref 4.0–10.5)

## 2016-11-25 ENCOUNTER — Other Ambulatory Visit: Payer: Self-pay | Admitting: Internal Medicine

## 2016-11-30 ENCOUNTER — Other Ambulatory Visit: Payer: Self-pay | Admitting: Internal Medicine

## 2016-12-13 NOTE — Progress Notes (Signed)
This encounter was created in error - please disregard.

## 2016-12-16 ENCOUNTER — Other Ambulatory Visit: Payer: Self-pay | Admitting: Internal Medicine

## 2017-04-12 ENCOUNTER — Emergency Department (HOSPITAL_COMMUNITY): Payer: Medicare PPO

## 2017-04-12 ENCOUNTER — Inpatient Hospital Stay (HOSPITAL_COMMUNITY)
Admission: EM | Admit: 2017-04-12 | Discharge: 2017-04-24 | DRG: 207 | Disposition: A | Discharge status: Skilled Nursing Facility | Payer: Medicare PPO | Attending: Internal Medicine | Admitting: Internal Medicine

## 2017-04-12 ENCOUNTER — Encounter (HOSPITAL_COMMUNITY): Payer: Self-pay | Admitting: Emergency Medicine

## 2017-04-12 DIAGNOSIS — Z85828 Personal history of other malignant neoplasm of skin: Secondary | ICD-10-CM | POA: Diagnosis not present

## 2017-04-12 DIAGNOSIS — Z794 Long term (current) use of insulin: Secondary | ICD-10-CM | POA: Diagnosis not present

## 2017-04-12 DIAGNOSIS — J9690 Respiratory failure, unspecified, unspecified whether with hypoxia or hypercapnia: Secondary | ICD-10-CM

## 2017-04-12 DIAGNOSIS — E1149 Type 2 diabetes mellitus with other diabetic neurological complication: Secondary | ICD-10-CM | POA: Diagnosis not present

## 2017-04-12 DIAGNOSIS — J449 Chronic obstructive pulmonary disease, unspecified: Secondary | ICD-10-CM

## 2017-04-12 DIAGNOSIS — F17210 Nicotine dependence, cigarettes, uncomplicated: Secondary | ICD-10-CM | POA: Diagnosis present

## 2017-04-12 DIAGNOSIS — J9602 Acute respiratory failure with hypercapnia: Secondary | ICD-10-CM | POA: Diagnosis present

## 2017-04-12 DIAGNOSIS — Z7982 Long term (current) use of aspirin: Secondary | ICD-10-CM | POA: Diagnosis not present

## 2017-04-12 DIAGNOSIS — Z960 Presence of urogenital implants: Secondary | ICD-10-CM | POA: Diagnosis present

## 2017-04-12 DIAGNOSIS — E119 Type 2 diabetes mellitus without complications: Secondary | ICD-10-CM

## 2017-04-12 DIAGNOSIS — Z825 Family history of asthma and other chronic lower respiratory diseases: Secondary | ICD-10-CM

## 2017-04-12 DIAGNOSIS — J189 Pneumonia, unspecified organism: Secondary | ICD-10-CM | POA: Diagnosis present

## 2017-04-12 DIAGNOSIS — G8929 Other chronic pain: Secondary | ICD-10-CM | POA: Diagnosis present

## 2017-04-12 DIAGNOSIS — I4891 Unspecified atrial fibrillation: Secondary | ICD-10-CM | POA: Diagnosis present

## 2017-04-12 DIAGNOSIS — I2510 Atherosclerotic heart disease of native coronary artery without angina pectoris: Secondary | ICD-10-CM | POA: Diagnosis present

## 2017-04-12 DIAGNOSIS — I110 Hypertensive heart disease with heart failure: Secondary | ICD-10-CM | POA: Diagnosis present

## 2017-04-12 DIAGNOSIS — J441 Chronic obstructive pulmonary disease with (acute) exacerbation: Principal | ICD-10-CM | POA: Diagnosis present

## 2017-04-12 DIAGNOSIS — Z8673 Personal history of transient ischemic attack (TIA), and cerebral infarction without residual deficits: Secondary | ICD-10-CM

## 2017-04-12 DIAGNOSIS — G720 Drug-induced myopathy: Secondary | ICD-10-CM | POA: Diagnosis present

## 2017-04-12 DIAGNOSIS — J9601 Acute respiratory failure with hypoxia: Secondary | ICD-10-CM | POA: Diagnosis present

## 2017-04-12 DIAGNOSIS — G9341 Metabolic encephalopathy: Secondary | ICD-10-CM | POA: Diagnosis present

## 2017-04-12 DIAGNOSIS — Z8249 Family history of ischemic heart disease and other diseases of the circulatory system: Secondary | ICD-10-CM

## 2017-04-12 DIAGNOSIS — N138 Other obstructive and reflux uropathy: Secondary | ICD-10-CM | POA: Diagnosis present

## 2017-04-12 DIAGNOSIS — E785 Hyperlipidemia, unspecified: Secondary | ICD-10-CM | POA: Diagnosis present

## 2017-04-12 DIAGNOSIS — Z87442 Personal history of urinary calculi: Secondary | ICD-10-CM | POA: Diagnosis not present

## 2017-04-12 DIAGNOSIS — K219 Gastro-esophageal reflux disease without esophagitis: Secondary | ICD-10-CM | POA: Diagnosis present

## 2017-04-12 DIAGNOSIS — I509 Heart failure, unspecified: Secondary | ICD-10-CM | POA: Diagnosis present

## 2017-04-12 DIAGNOSIS — Z978 Presence of other specified devices: Secondary | ICD-10-CM | POA: Diagnosis not present

## 2017-04-12 DIAGNOSIS — Z95828 Presence of other vascular implants and grafts: Secondary | ICD-10-CM

## 2017-04-12 DIAGNOSIS — J9622 Acute and chronic respiratory failure with hypercapnia: Secondary | ICD-10-CM | POA: Diagnosis present

## 2017-04-12 DIAGNOSIS — Z79899 Other long term (current) drug therapy: Secondary | ICD-10-CM

## 2017-04-12 DIAGNOSIS — Z888 Allergy status to other drugs, medicaments and biological substances status: Secondary | ICD-10-CM

## 2017-04-12 DIAGNOSIS — I10 Essential (primary) hypertension: Secondary | ICD-10-CM | POA: Diagnosis not present

## 2017-04-12 DIAGNOSIS — I340 Nonrheumatic mitral (valve) insufficiency: Secondary | ICD-10-CM | POA: Diagnosis not present

## 2017-04-12 DIAGNOSIS — G7281 Critical illness myopathy: Secondary | ICD-10-CM | POA: Diagnosis not present

## 2017-04-12 DIAGNOSIS — F17200 Nicotine dependence, unspecified, uncomplicated: Secondary | ICD-10-CM | POA: Diagnosis present

## 2017-04-12 DIAGNOSIS — Z789 Other specified health status: Secondary | ICD-10-CM

## 2017-04-12 DIAGNOSIS — T380X5A Adverse effect of glucocorticoids and synthetic analogues, initial encounter: Secondary | ICD-10-CM | POA: Diagnosis present

## 2017-04-12 LAB — BLOOD GAS, ARTERIAL
ACID-BASE EXCESS: 10.2 mmol/L — AB (ref 0.0–2.0)
Acid-Base Excess: 10.1 mmol/L — ABNORMAL HIGH (ref 0.0–2.0)
Bicarbonate: 31.1 mmol/L — ABNORMAL HIGH (ref 20.0–28.0)
Bicarbonate: 31.4 mmol/L — ABNORMAL HIGH (ref 20.0–28.0)
Delivery systems: POSITIVE
Drawn by: 27733
Drawn by: 2773313
Expiratory PAP: 6
FIO2: 0.4
INSPIRATORY PAP: 18
Mode: POSITIVE
O2 CONTENT: 5 L/min
O2 SAT: 97.9 %
O2 SAT: 97.5 %
PATIENT TEMPERATURE: 37
PCO2 ART: 84.3 mmHg — AB (ref 32.0–48.0)
PCO2 ART: 74.3 mmHg — AB (ref 32.0–48.0)
PH ART: 7.312 — AB (ref 7.350–7.450)
PO2 ART: 144 mmHg — AB (ref 83.0–108.0)
PO2 ART: 121 mmHg — AB (ref 83.0–108.0)
Patient temperature: 37
RATE: 12 resp/min
pH, Arterial: 7.268 — ABNORMAL LOW (ref 7.350–7.450)

## 2017-04-12 LAB — COMPREHENSIVE METABOLIC PANEL
ALK PHOS: 87 U/L (ref 38–126)
ALT: 40 U/L (ref 17–63)
ANION GAP: 8 (ref 5–15)
AST: 28 U/L (ref 15–41)
Albumin: 3.1 g/dL — ABNORMAL LOW (ref 3.5–5.0)
BILIRUBIN TOTAL: 0.7 mg/dL (ref 0.3–1.2)
BUN: 16 mg/dL (ref 6–20)
CALCIUM: 8.9 mg/dL (ref 8.9–10.3)
CO2: 36 mmol/L — ABNORMAL HIGH (ref 22–32)
Chloride: 97 mmol/L — ABNORMAL LOW (ref 101–111)
Creatinine, Ser: 0.72 mg/dL (ref 0.61–1.24)
GFR calc Af Amer: >60 mL/min (ref >60)
Glucose, Bld: 162 mg/dL — ABNORMAL HIGH (ref 65–99)
Potassium: 4.4 mmol/L (ref 3.5–5.1)
SODIUM: 141 mmol/L (ref 135–145)
TOTAL PROTEIN: 6.9 g/dL (ref 6.5–8.1)

## 2017-04-12 LAB — CBC WITH DIFFERENTIAL/PLATELET
Basophils Absolute: 0.1 10*3/uL (ref 0.0–0.1)
Basophils Relative: 1 %
EOS ABS: 0 10*3/uL (ref 0.0–0.7)
Eosinophils Relative: 0 %
HEMATOCRIT: 44.1 % (ref 39.0–52.0)
HEMOGLOBIN: 13.6 g/dL (ref 13.0–17.0)
LYMPHS PCT: 11 %
Lymphs Abs: 1 10*3/uL (ref 0.7–4.0)
MCH: 26.4 pg (ref 26.0–34.0)
MCHC: 30.8 g/dL (ref 30.0–36.0)
MCV: 85.5 fL (ref 78.0–100.0)
Monocytes Absolute: 0.9 10*3/uL (ref 0.1–1.0)
Monocytes Relative: 10 %
Neutro Abs: 7.2 10*3/uL (ref 1.7–7.7)
Neutrophils Relative %: 78 %
Platelets: 216 10*3/uL (ref 150–400)
RBC: 5.16 MIL/uL (ref 4.22–5.81)
RDW: 18.4 % — ABNORMAL HIGH (ref 11.5–15.5)
WBC: 9.2 10*3/uL (ref 4.0–10.5)

## 2017-04-12 LAB — GLUCOSE, CAPILLARY
GLUCOSE-CAPILLARY: 235 mg/dL — AB (ref 65–99)
GLUCOSE-CAPILLARY: 246 mg/dL — AB (ref 65–99)

## 2017-04-12 LAB — I-STAT CG4 LACTIC ACID, ED: Lactic Acid, Venous: 0.69 mmol/L (ref 0.5–1.9)

## 2017-04-12 LAB — CBG MONITORING, ED: GLUCOSE-CAPILLARY: 153 mg/dL — AB (ref 65–99)

## 2017-04-12 LAB — TROPONIN I: TROPONIN I: 0.03 ng/mL — AB (ref <0.03)

## 2017-04-12 LAB — BRAIN NATRIURETIC PEPTIDE: B NATRIURETIC PEPTIDE 5: 317 pg/mL — AB (ref 0.0–100.0)

## 2017-04-12 MED ORDER — LORAZEPAM 2 MG/ML IJ SOLN
0.5000 mg | Freq: Once | INTRAMUSCULAR | Status: AC
  Administered 2017-04-12: 0.5 mg via INTRAVENOUS
  Filled 2017-04-12: qty 1

## 2017-04-12 MED ORDER — ENOXAPARIN SODIUM 40 MG/0.4ML ~~LOC~~ SOLN
40.0000 mg | SUBCUTANEOUS | Status: DC
  Administered 2017-04-12 – 2017-04-22 (×11): 40 mg via SUBCUTANEOUS
  Filled 2017-04-12 (×11): qty 0.4

## 2017-04-12 MED ORDER — METHYLPREDNISOLONE SODIUM SUCC 125 MG IJ SOLR
125.0000 mg | Freq: Once | INTRAMUSCULAR | Status: AC
  Administered 2017-04-12: 125 mg via INTRAVENOUS
  Filled 2017-04-12: qty 2

## 2017-04-12 MED ORDER — INSULIN ASPART 100 UNIT/ML ~~LOC~~ SOLN
0.0000 [IU] | Freq: Three times a day (TID) | SUBCUTANEOUS | Status: DC
  Administered 2017-04-13: 2 [IU] via SUBCUTANEOUS
  Administered 2017-04-13: 3 [IU] via SUBCUTANEOUS
  Administered 2017-04-13: 2 [IU] via SUBCUTANEOUS
  Administered 2017-04-14 (×2): 3 [IU] via SUBCUTANEOUS

## 2017-04-12 MED ORDER — ONDANSETRON HCL 4 MG PO TABS: 4.0000 mg | ORAL_TABLET | Freq: Four times a day (QID) | ORAL | Status: DC | PRN

## 2017-04-12 MED ORDER — METHYLPREDNISOLONE SODIUM SUCC 125 MG IJ SOLR
80.0000 mg | Freq: Three times a day (TID) | INTRAMUSCULAR | Status: DC
  Administered 2017-04-12 – 2017-04-21 (×26): 80 mg via INTRAVENOUS
  Filled 2017-04-12 (×27): qty 2

## 2017-04-12 MED ORDER — ALBUTEROL SULFATE (2.5 MG/3ML) 0.083% IN NEBU
2.5000 mg | INHALATION_SOLUTION | RESPIRATORY_TRACT | Status: DC | PRN
  Administered 2017-04-13 – 2017-04-16 (×8): 2.5 mg via RESPIRATORY_TRACT
  Filled 2017-04-12 (×7): qty 3

## 2017-04-12 MED ORDER — ONDANSETRON HCL 4 MG/2ML IJ SOLN: 4.0000 mg | Freq: Four times a day (QID) | INTRAMUSCULAR | Status: DC | PRN

## 2017-04-12 MED ORDER — NICOTINE 14 MG/24HR TD PT24
14.0000 mg | MEDICATED_PATCH | Freq: Every day | TRANSDERMAL | Status: DC
  Administered 2017-04-12 – 2017-04-24 (×13): 14 mg via TRANSDERMAL
  Filled 2017-04-12 (×13): qty 1

## 2017-04-12 MED ORDER — ASPIRIN 300 MG RE SUPP
300.0000 mg | Freq: Every day | RECTAL | Status: DC
  Administered 2017-04-13 – 2017-04-20 (×8): 300 mg via RECTAL
  Filled 2017-04-12 (×8): qty 1

## 2017-04-12 MED ORDER — SODIUM CHLORIDE 0.9 % IV SOLN
INTRAVENOUS | Status: DC
  Administered 2017-04-12 – 2017-04-16 (×3): via INTRAVENOUS
  Administered 2017-04-17: 1000 mL via INTRAVENOUS
  Administered 2017-04-18 – 2017-04-19 (×2): via INTRAVENOUS

## 2017-04-12 MED ORDER — IPRATROPIUM BROMIDE 0.02 % IN SOLN
0.5000 mg | Freq: Four times a day (QID) | RESPIRATORY_TRACT | Status: DC
  Administered 2017-04-12 – 2017-04-15 (×12): 0.5 mg via RESPIRATORY_TRACT
  Filled 2017-04-12 (×11): qty 2.5

## 2017-04-12 MED ORDER — ALBUTEROL (5 MG/ML) CONTINUOUS INHALATION SOLN
10.0000 mg/h | INHALATION_SOLUTION | RESPIRATORY_TRACT | Status: DC
  Administered 2017-04-12: 10 mg/h via RESPIRATORY_TRACT
  Filled 2017-04-12: qty 20

## 2017-04-12 MED ORDER — ACETAMINOPHEN 325 MG PO TABS
650.0000 mg | ORAL_TABLET | Freq: Four times a day (QID) | ORAL | Status: DC | PRN
  Administered 2017-04-15 – 2017-04-24 (×3): 650 mg via ORAL
  Filled 2017-04-12 (×3): qty 2

## 2017-04-12 MED ORDER — ACETAMINOPHEN 650 MG RE SUPP: 650.0000 mg | Freq: Four times a day (QID) | RECTAL | Status: DC | PRN

## 2017-04-12 MED ORDER — INSULIN ASPART 100 UNIT/ML ~~LOC~~ SOLN
0.0000 [IU] | Freq: Every day | SUBCUTANEOUS | Status: DC
  Administered 2017-04-12 – 2017-04-13 (×2): 2 [IU] via SUBCUTANEOUS

## 2017-04-12 MED ORDER — SENNOSIDES-DOCUSATE SODIUM 8.6-50 MG PO TABS
1.0000 | ORAL_TABLET | Freq: Every evening | ORAL | Status: DC | PRN
  Administered 2017-04-21: 1 via ORAL
  Filled 2017-04-12: qty 1

## 2017-04-12 MED ORDER — DEXTROSE 5 % IV SOLN
500.0000 mg | Freq: Every day | INTRAVENOUS | Status: AC
  Administered 2017-04-12 – 2017-04-16 (×5): 500 mg via INTRAVENOUS
  Filled 2017-04-12 (×6): qty 500

## 2017-04-12 NOTE — ED Notes (Signed)
Pt currently on Bi-PAP. Oxygen 98%.

## 2017-04-12 NOTE — ED Notes (Signed)
EKG given to Dr. [place]elverton 

## 2017-04-12 NOTE — ED Provider Notes (Signed)
[facility] DEPT Provider Note   CSN: [id number] Arrival date & time: 04/12/17  1140     History   Chief Complaint Chief Complaint  Patient presents with  . Altered Mental Status    HPI Andrew [name] is a 76 y.o. male.  HPI Patient found unresponsive by daughter. Unable to contribute history. Level V caveat applies. Per EMS, patient lives alone but his daughter checks on him daily. Has had decreased functioning of the last few days and was found to be unresponsive today. No evidence of trauma. Patient was hypoxic in route with normal CBG and blood pressure. Placed on supplement oxygen and given DuoNeb. Patient has history of CHF and COPD. Past Medical History:  Diagnosis Date  . A-fib ([place])   . Abdominal pain   . Adenomatous colon polyp   . Arthritis   . Asthma   . CAD (coronary artery disease)   . Cancer ([place])    skin, nose  . Cataract    bilateral  . Chronic LBP   . COPD (chronic obstructive pulmonary disease) ([place])   . Decreased libido   . Diverticulosis of colon   . Esophageal stricture   . Fatigue   . GERD (gastroesophageal reflux disease)   . History of small bowel obstruction   . Hyperlipidemia   . Hypertension   . Hypertrophy of prostate with urinary obstruction and other lower urinary tract symptoms (LUTS)   . Palpitations   . Peri-rectal abscess   . Personal history of renal calculi   . Prostatitis, acute   . Tobacco abuse     Patient Active Problem List   Diagnosis Date Noted  . Acute respiratory failure with hypoxia and hypercapnia ([place]) 04/12/2017  . Pneumonia 10/24/2016  . AAA (abdominal aortic aneurysm) ([place]) 01/04/2011  . Dizziness, nonspecific 12/26/2010  . UNSPECIFIED PERIPHERAL VASCULAR DISEASE 09/23/2010  . LEG CRAMPS 09/13/2010  . Diabetes mellitus ([place]) 02/11/2010  . CHRONIC OBSTRUCTIVE PULMONARY DISEASE, ACUTE EXACERBATION 02/11/2010  . OLECRANON BURSITIS, RIGHT 01/26/2010  . PENILE PAIN 12/29/2009  . ABNORMAL EJACULATION  05/05/2009  . LARYNGITIS, ACUTE 04/21/2009  . ATRIAL FIBRILLATION 04/08/2009  . PALPITATIONS 04/08/2009  . FATIGUE 03/31/2009  . LIBIDO, DECREASED 03/31/2009  . HYPERTROPHY PROSTATE W/UR OBST & OTH LUTS 03/25/2009  . ACUTE PROSTATITIS 03/25/2009  . SMALL BOWEL OBSTRUCTION, HX OF 11/28/2007  . HYPERLIPIDEMIA 11/13/2007  . COPD 10/02/2007  . LOW BACK PAIN, CHRONIC 10/02/2007  . ADENOMATOUS COLONIC POLYP 05/22/2007  . ESOPHAGEAL STRICTURE 05/22/2007  . DIVERTICULOSIS, COLON 05/22/2007  . TOBACCO ABUSE 05/08/2007  . Essential hypertension 05/08/2007  . CORONARY ARTERY DISEASE 05/08/2007  . ASTHMA 05/08/2007  . GERD 05/08/2007  . RENAL CALCULUS, HX OF 05/08/2007  . ABSCESS, PERIRECTAL, HX OF 05/08/2007  . ARTHRITIS, HX OF 05/08/2007  . PENILE PROSTHESIS 05/08/2007  . LAMINECTOMY, LUMBAR, HX OF 05/08/2007    Past Surgical History:  Procedure Laterality Date  . CATARACT EXTRACTION  04/2010   bilateral  . HERNIA REPAIR    . LUMBAR LAMINECTOMY     with Harrington rods  . PENILE PROSTHESIS PLACEMENT    . TONSILLECTOMY         Home Medications    Prior to Admission medications   Medication Sig Start Date End Date Taking? Authorizing Provider  albuterol (PROVENTIL HFA;VENTOLIN HFA) 108 (90 BASE) MCG/ACT inhaler Inhale 2 puffs into the lungs every 4 (four) hours as needed for wheezing.    Yes [provider]  albuterol (PROVENTIL) (2.5 MG/3ML) 0.083%  nebulizer solution Take 2.5 mg by nebulization every 4 (four) hours as needed for wheezing or shortness of breath.   Yes [provider]  aspirin 325 MG tablet Take 325 mg by mouth daily.   Yes [provider]  Cyanocobalamin (VITAMIN B-12) 2500 MCG SUBL Place 2,500 mcg under the tongue daily.   Yes [provider]  cyclobenzaprine (FLEXERIL) 10 MG tablet Take 5 mg by mouth at bedtime.   Yes [provider]  diltiazem (TIAZAC) 300 MG 24 hr capsule Take 300 mg by mouth daily.    Yes  [provider]  diphenhydrAMINE (BENADRYL) 25 mg capsule Take 25 mg by mouth at bedtime as needed.   Yes [provider]  furosemide (LASIX) 20 MG tablet Take 40 mg by mouth daily.    Yes [provider]  gabapentin (NEURONTIN) 400 MG capsule Take 400 mg by mouth 4 (four) times daily.   Yes [provider]  glipiZIDE (GLUCOTROL) 5 MG tablet Take 5 mg by mouth daily before breakfast.   Yes [provider]  ipratropium-albuterol (DUONEB) 0.5-2.5 (3) MG/3ML SOLN Take 3 mLs by nebulization QID.    Yes [provider]  metoCLOPramide (REGLAN) 10 MG tablet Take 10 mg by mouth 4 (four) times daily.   Yes [provider]  montelukast (SINGULAIR) 10 MG tablet Take 10 mg by mouth daily.   Yes [provider]  omeprazole (PRILOSEC) 40 MG capsule Take 40 mg by mouth daily.   Yes [provider]  rosuvastatin (CRESTOR) 20 MG tablet Take 1 tablet (20 mg total) by mouth daily. 12/29/10  Yes Yoo, Doe-Hyun R, DO  saxagliptin HCl (ONGLYZA) 5 MG TABS tablet Take 5 mg by mouth daily.   Yes [provider]  tiZANidine (ZANAFLEX) 4 MG tablet Take 4 mg by mouth 3 (three) times daily.   Yes [provider]  umeclidinium-vilanterol (ANORO ELLIPTA) 62.5-25 MCG/INH AEPB Inhale 1 puff into the lungs daily.   Yes [provider]  cefdinir (OMNICEF) 300 MG capsule Take 300 mg by mouth 2 (two) times daily.    [provider]  insulin aspart (NOVOLOG) 100 UNIT/ML injection Per sliding scale    [provider]  metFORMIN (GLUMETZA) 1000 MG (MOD) 24 hr tablet Take 1,000 mg by mouth daily with breakfast.    [provider]    Family History Family History  Problem Relation Age of Onset  . Heart disease Brother        CAD  . Diabetes Brother   . COPD Brother   . Emphysema Brother     Social History Social History  Substance Use Topics  . Smoking status: Current Every Day Smoker    Types:  Cigarettes  . Smokeless tobacco: Never Used  . Alcohol use No     Allergies   Procaine; Procaine hcl; and Atorvastatin   Review of Systems Review of Systems  Unable to perform ROS: Mental status change     Physical Exam Updated Vital Signs BP (!) 160/71   Pulse 85   Temp 99.3 F (37.4 C)   Resp 18   Ht 5\' 11"  (1.803 m)   Wt 78 kg (172 lb)   SpO2 99%   BMI 23.99 kg/m   Physical Exam  Constitutional: He appears well-developed and well-nourished.  HENT:  Head: Normocephalic and atraumatic.  Mouth/Throat: Oropharynx is clear and moist.  Eyes: Pupils are equal, round, and reactive to light. EOM are normal.  Bilateral chemosis  Neck: Normal range of motion. Neck supple.  No stridor.  Cardiovascular: Normal rate and regular rhythm.  Exam reveals no gallop and no friction rub.   No murmur heard. Pulmonary/Chest:  Diminished air movement with expiratory wheezing throughout. Crackles in bilateral bases.  Abdominal: Soft. Bowel sounds are normal. He exhibits distension. There is no tenderness. There is no rebound and no guarding.  Musculoskeletal: Normal range of motion. He exhibits edema. He exhibits no tenderness.  2+ bilateral lower extremity edema. Distal pulses are 2+. No lower extremity asymmetry  Neurological:  Patient is lethargic. Will follow simple commands. Appears to be moving all extremities.  Skin: Skin is warm and dry. Capillary refill takes less than 2 seconds. No rash noted. No erythema.  Nursing note and vitals reviewed.    ED Treatments / Results  Labs (all labs ordered are listed, but only abnormal results are displayed) Labs Reviewed  CBC WITH DIFFERENTIAL/PLATELET - Abnormal; Notable for the following:       Result Value   RDW 18.4 (*)    All other components within normal limits  COMPREHENSIVE METABOLIC PANEL - Abnormal; Notable for the following:    Chloride 97 (*)    CO2 36 (*)    Glucose, Bld 162 (*)    Albumin 3.1 (*)    All other  components within normal limits  BRAIN NATRIURETIC PEPTIDE - Abnormal; Notable for the following:    B Natriuretic Peptide 317.0 (*)    All other components within normal limits  TROPONIN I - Abnormal; Notable for the following:    Troponin I 0.03 (*)    All other components within normal limits  BLOOD GAS, ARTERIAL - Abnormal; Notable for the following:    pH, Arterial 7.268 (*)    pCO2 arterial 84.3 (*)    pO2, Arterial 144 (*)    Bicarbonate 31.1 (*)    Acid-Base Excess 10.2 (*)    All other components within normal limits  CBG MONITORING, ED - Abnormal; Notable for the following:    Glucose-Capillary 153 (*)    All other components within normal limits  I-STAT CG4 LACTIC ACID, ED    EKG  EKG Interpretation  Date/Time:  Wednesday April 12 2017 11:50:25 EDT Ventricular Rate:  85 PR Interval:    QRS Duration: 101 QT Interval:  368 QTC Calculation: 438 R Axis:   89 Text Interpretation:  Sinus rhythm Borderline right axis deviation Minimal ST depression, inferior leads ST elevation, consider anterior injury Confirmed by Lita Mains  MD, Jose Alleyne ([id number]) on 04/12/2017 2:01:29 PM       Radiology Dg Chest Portable 1 View  Result Date: 04/12/2017 CLINICAL DATA:  Hypoxia EXAM: PORTABLE CHEST 1 VIEW COMPARISON:  April 20, 2010 FINDINGS: There is atelectatic change in the left base. There is no frank edema or consolidation. There is cardiomegaly with mild pulmonary venous hypertension. No adenopathy. No bone lesions. IMPRESSION: Pulmonary vascular congestion without frank edema or consolidation. Left base atelectasis. Electronically Signed   By: Lowella Grip III M.D.   On: 04/12/2017 12:14    Procedures Procedures (including critical care time)  Medications Ordered in ED Medications  albuterol (PROVENTIL,VENTOLIN) solution continuous neb (10 mg/hr Nebulization New Bag/Given 04/12/17 1222)  methylPREDNISolone sodium succinate (SOLU-MEDROL) 125 mg/2 mL injection 125 mg (125 mg  Intravenous Given 04/12/17 1216)    ABG with acidosis and evidence of hypercarbia. Likely is possible the patient's mental status change. Placed on BiPAP. Patient is now drowsy but when awakened is attempting  to speak in following commands. Patient also likely has exacerbation of his CHF which is complicating.  Discussed with hospitalist who will see patient in the emergency department and admit to ICU. Initial Impression / Assessment and Plan / ED Course  I have reviewed the triage vital signs and the nursing notes.  Pertinent labs & imaging results that were available during my care of the patient were reviewed by me and considered in my medical decision making (see chart for details).     CRITICAL CARE Performed by: Lita Mains, Robey Massmann Total critical care time: 35 minutes Critical care time was exclusive of separately billable procedures and treating other patients. Critical care was necessary to treat or prevent imminent or life-threatening deterioration. Critical care was time spent personally by me on the following activities: development of treatment plan with patient and/or surrogate as well as nursing, discussions with consultants, evaluation of patient's response to treatment, examination of patient, obtaining history from patient or surrogate, ordering and performing treatments and interventions, ordering and review of laboratory studies, ordering and review of radiographic studies, pulse oximetry and re-evaluation of patient's condition.   Final Clinical Impressions(s) / ED Diagnoses   Final diagnoses:  COPD exacerbation ([place])  Acute respiratory failure with hypoxia and hypercarbia [facility])    New Prescriptions New Prescriptions   No medications on file     Julianne Rice, MD 04/12/17 [phone]

## 2017-04-12 NOTE — ED Triage Notes (Signed)
Per EMS pt daughter reported generalized weakness and ftt x 4 days. Pt daughter unable to wake pt this am. Pt responsive to pain only upon arrival. Labored respirations.

## 2017-04-12 NOTE — H&P (Signed)
History and Physical    Andrew [name]:[id number] DOB: Apr 21, 1941 DOA: 04/12/2017  Referring MD/NP/PA: Julianne Rice, EDP PCP: System, Provider Not In  Patient coming from: Home  Chief Complaint: Lethargic, poorly responsive  HPI: Andrew [name] is a 76 y.o. male with history of COPD who lives alone. Currently patient is on BiPAP and I am unable to obtain any history from him and there is no family at bedside. Per EDP report patient lives alone however his daughter checks on him daily. For the last few days he was found to be weaker and today when she went to see him he was found to be unresponsive, no evidence of trauma. EMS was dispatched and on arrival he was found to be hypoxic with saturations in the 70s with a normal CBC and blood pressure. He was given breathing treatments placed on oxygen and transferred to the emergency department. Here he was noticed to have severe wheezing on exam. Vital signs within normal limits with the exception of a respiratory rate in the 30s, chest x-ray only with some mild pulmonary vascular congestion, initial ABG had a pH of 7.26 with a PCO2 of 84 and a bicarbonate of 31. He was started on BiPAP and admission was requested.  Past Medical/Surgical History: Past Medical History:  Diagnosis Date  . A-fib ([place])   . Abdominal pain   . Adenomatous colon polyp   . Arthritis   . Asthma   . CAD (coronary artery disease)   . Cancer ([place])    skin, nose  . Cataract    bilateral  . Chronic LBP   . COPD (chronic obstructive pulmonary disease) ([place])   . Decreased libido   . Diverticulosis of colon   . Esophageal stricture   . Fatigue   . GERD (gastroesophageal reflux disease)   . History of small bowel obstruction   . Hyperlipidemia   . Hypertension   . Hypertrophy of prostate with urinary obstruction and other lower urinary tract symptoms (LUTS)   . Palpitations   . Peri-rectal abscess   . Personal history of renal calculi   . Prostatitis, acute   .  Tobacco abuse     Past Surgical History:  Procedure Laterality Date  . CATARACT EXTRACTION  04/2010   bilateral  . HERNIA REPAIR    . LUMBAR LAMINECTOMY     with Harrington rods  . PENILE PROSTHESIS PLACEMENT    . TONSILLECTOMY      Social History:  reports that he has been smoking Cigarettes.  He has never used smokeless tobacco. He reports that he does not drink alcohol or use drugs.  Allergies: Allergies  Allergen Reactions  . Procaine Other (See Comments) and Shortness Of Breath    Syncope  . Procaine Hcl Anaphylaxis and Swelling  . Atorvastatin Palpitations    REACTION: Heartburn    Family History:  Family History  Problem Relation Age of Onset  . Heart disease Brother        CAD  . Diabetes Brother   . COPD Brother   . Emphysema Brother     Prior to Admission medications   Medication Sig Start Date End Date Taking? Authorizing Provider  albuterol (PROVENTIL HFA;VENTOLIN HFA) 108 (90 BASE) MCG/ACT inhaler Inhale 2 puffs into the lungs every 4 (four) hours as needed for wheezing.    Yes [provider]  albuterol (PROVENTIL) (2.5 MG/3ML) 0.083% nebulizer solution Take 2.5 mg by nebulization every 4 (four) hours as needed for wheezing  or shortness of breath.   Yes [provider]  aspirin 325 MG tablet Take 325 mg by mouth daily.   Yes [provider]  Cyanocobalamin (VITAMIN B-12) 2500 MCG SUBL Place 2,500 mcg under the tongue daily.   Yes [provider]  cyclobenzaprine (FLEXERIL) 10 MG tablet Take 5 mg by mouth at bedtime.   Yes [provider]  diltiazem (TIAZAC) 300 MG 24 hr capsule Take 300 mg by mouth daily.    Yes [provider]  diphenhydrAMINE (BENADRYL) 25 mg capsule Take 25 mg by mouth at bedtime as needed.   Yes [provider]  furosemide (LASIX) 20 MG tablet Take 40 mg by mouth daily.    Yes [provider]  gabapentin (NEURONTIN) 400 MG capsule Take 400 mg by mouth 4 (four) times  daily.   Yes [provider]  glipiZIDE (GLUCOTROL) 5 MG tablet Take 5 mg by mouth daily before breakfast.   Yes [provider]  ipratropium-albuterol (DUONEB) 0.5-2.5 (3) MG/3ML SOLN Take 3 mLs by nebulization QID.    Yes [provider]  metoCLOPramide (REGLAN) 10 MG tablet Take 10 mg by mouth 4 (four) times daily.   Yes [provider]  montelukast (SINGULAIR) 10 MG tablet Take 10 mg by mouth daily.   Yes [provider]  omeprazole (PRILOSEC) 40 MG capsule Take 40 mg by mouth daily.   Yes [provider]  rosuvastatin (CRESTOR) 20 MG tablet Take 1 tablet (20 mg total) by mouth daily. 12/29/10  Yes Yoo, Doe-Hyun R, DO  saxagliptin HCl (ONGLYZA) 5 MG TABS tablet Take 5 mg by mouth daily.   Yes [provider]  tiZANidine (ZANAFLEX) 4 MG tablet Take 4 mg by mouth 3 (three) times daily.   Yes [provider]  umeclidinium-vilanterol (ANORO ELLIPTA) 62.5-25 MCG/INH AEPB Inhale 1 puff into the lungs daily.   Yes [provider]  cefdinir (OMNICEF) 300 MG capsule Take 300 mg by mouth 2 (two) times daily.    [provider]  insulin aspart (NOVOLOG) 100 UNIT/ML injection Per sliding scale    [provider]  metFORMIN (GLUMETZA) 1000 MG (MOD) 24 hr tablet Take 1,000 mg by mouth daily with breakfast.    [provider]    Review of Systems:  Unable to obtain at present given his current mental state   Physical Exam: Vitals:   04/12/17 1730 04/12/17 1800 04/12/17 1830 04/12/17 1831  BP: (!) 124/93 (!) 152/80    Pulse: (!) 104 92    Resp: (!) 30 (!) 37    Temp: 99.3 F (37.4 C) 99 F (37.2 C)  98.4 F (36.9 C)  TempSrc:    Axillary  SpO2: (!) 81% 93%    Weight:   86.5 kg (190 lb 11.2 oz)   Height:   5\' 11"  (1.803 m)      Constitutional: Continue the ICU, on BiPAP, opens eyes briefly to voice and falls back asleep Eyes: PERRL, lids and conjunctivae normal ENMT: Mucous membranes  are moist. Posterior pharynx clear of any exudate or lesions.Normal dentition.  Neck: normal, supple, no masses, no thyromegaly Respiratory: Significant expiratory wheezes Cardiovascular: Regular rate and rhythm, no murmurs / rubs / gallops. No extremity edema. 2+ pedal pulses. No carotid bruits.  Abdomen: no tenderness, no masses palpated. No hepatosplenomegaly. Bowel sounds positive.  Musculoskeletal: no clubbing / cyanosis. No joint deformity upper and lower extremities. Good ROM, no contractures. Normal muscle tone.  Skin: no rashes,  lesions, ulcers. No induration Neurologic: Unable to assess given current mental state Psychiatric: Unable to assess given current mental state   Labs on Admission: I have personally reviewed the following labs and imaging studies  CBC:  Recent Labs Lab 04/12/17 1200  WBC 9.2  NEUTROABS 7.2  HGB 13.6  HCT 44.1  MCV 85.5  PLT 782   Basic Metabolic Panel:  Recent Labs Lab 04/12/17 1200  NA 141  K 4.4  CL 97*  CO2 36*  GLUCOSE 162*  BUN 16  CREATININE 0.72  CALCIUM 8.9   GFR: Estimated Creatinine Clearance: 81.1 mL/min (by C-G formula based on SCr of 0.72 mg/dL). Liver Function Tests:  Recent Labs Lab 04/12/17 1200  AST 28  ALT 40  ALKPHOS 87  BILITOT 0.7  PROT 6.9  ALBUMIN 3.1*   No results for input(s): LIPASE, AMYLASE in the last 168 hours. No results for input(s): AMMONIA in the last 168 hours. Coagulation Profile: No results for input(s): INR, PROTIME in the last 168 hours. Cardiac Enzymes:  Recent Labs Lab 04/12/17 1200  TROPONINI 0.03*   BNP (last 3 results) No results for input(s): PROBNP in the last 8760 hours. HbA1C: No results for input(s): HGBA1C in the last 72 hours. CBG:  Recent Labs Lab 04/12/17 1152  GLUCAP 153*   Lipid Profile: No results for input(s): CHOL, HDL, LDLCALC, TRIG, CHOLHDL, LDLDIRECT in the last 72 hours. Thyroid Function Tests: No results for input(s): TSH, T4TOTAL, FREET4,  T3FREE, THYROIDAB in the last 72 hours. Anemia Panel: No results for input(s): VITAMINB12, FOLATE, FERRITIN, TIBC, IRON, RETICCTPCT in the last 72 hours. Urine analysis:    Component Value Date/Time   COLORURINE AMBER (A) 05/05/2013 1621   APPEARANCEUR CLEAR 05/05/2013 1621   LABSPEC 1.025 05/05/2013 1621   PHURINE 5.5 05/05/2013 1621   GLUCOSEU NEGATIVE 05/05/2013 1621   HGBUR NEGATIVE 05/05/2013 1621   HGBUR negative 03/31/2009 1514   BILIRUBINUR SMALL (A) 05/05/2013 1621   KETONESUR NEGATIVE 05/05/2013 1621   PROTEINUR NEGATIVE 05/05/2013 1621   UROBILINOGEN 1.0 05/05/2013 1621   NITRITE NEGATIVE 05/05/2013 1621   LEUKOCYTESUR NEGATIVE 05/05/2013 1621   Sepsis Labs: @LABRCNTIP (procalcitonin:4,lacticidven:4) )No results found for this or any previous visit (from the past 240 hour(s)).   Radiological Exams on Admission: Dg Chest Portable 1 View  Result Date: 04/12/2017 CLINICAL DATA:  Hypoxia EXAM: PORTABLE CHEST 1 VIEW COMPARISON:  April 20, 2010 FINDINGS: There is atelectatic change in the left base. There is no frank edema or consolidation. There is cardiomegaly with mild pulmonary venous hypertension. No adenopathy. No bone lesions. IMPRESSION: Pulmonary vascular congestion without frank edema or consolidation. Left base atelectasis. Electronically Signed   By: Lowella Grip III M.D.   On: 04/12/2017 12:14    EKG: Independently reviewed. Sinus rhythm at a rate of 88, right axis deviation, no acute ischemic changes  Assessment/Plan Principal Problem:   Acute respiratory failure with hypoxia and hypercapnia (HCC) Active Problems:   Diabetes mellitus (HCC)   Hyperlipidemia   TOBACCO ABUSE   Essential hypertension   ATRIAL FIBRILLATION   CHRONIC OBSTRUCTIVE PULMONARY DISEASE, ACUTE EXACERBATION    Acute respiratory failure with hypoxia and hypercapnia -Due to COPD with acute exacerbation. -Continue BiPAP, repeat ABG a few hours after initiating BiPAP shows  improvement with a pH of 7.3 and a PCO2 down to 74. -Started on steroids, frequent nebs, empiric antibiotic therapy with azithromycin. -Will request Dr. Luan Pulling to see patient in consultation in the morning for further recommendations. -See  no need for intubation at present as ABG is improving and as per RN he has had some periods of wakefulness.  Atrial fibrillation -Currently in sinus rhythm, not anticoagulated. -Continue aspirin  Tobacco abuse -Will need to discuss cessation once more alert.  Diabetes -Hold oral agents given his altered level of consciousness. -Placed on a sensitive sliding scale.  Hypertension -Fair control, resume oral agents once sensorium has improved.  GERD -PPI on hold for now, resume once able to take orals.  Hyperlipidemia -Continue statin   DVT prophylaxis: Lovenox  Code Status: Full code  Family Communication: Patient only  Disposition Plan: Admit to ICU, anticipate discharge home in 2-3 days  Consults called: Pulmonology  Admission status: Inpatient    Critical care time Spent: 90 minutes  Lelon Frohlich MD [facility] Pager [phone]  If 7PM-7AM, please contact night-coverage [email or web address] Password [email or web address]  04/12/2017, 7:09 PM

## 2017-04-12 NOTE — ED Notes (Signed)
There has been downtime until this point. Pt has had multiple hourly rounding. I spoke with daughter about test results several times. When daughter was in the room pt would become more agitated and breathing over Bi-PAP.   This nurse walked into patient room and pt was standing on the edge of the bed hunched over with bi-pap unattached. I gathered staff to help me get patient into the bed. Pt VS stable at this time. His left arm IV had been removed by patient.

## 2017-04-13 ENCOUNTER — Inpatient Hospital Stay (HOSPITAL_COMMUNITY): Payer: Medicare PPO

## 2017-04-13 DIAGNOSIS — I340 Nonrheumatic mitral (valve) insufficiency: Secondary | ICD-10-CM

## 2017-04-13 LAB — BLOOD GAS, ARTERIAL
ACID-BASE EXCESS: 11.5 mmol/L — AB (ref 0.0–2.0)
Acid-Base Excess: 10.3 mmol/L — ABNORMAL HIGH (ref 0.0–2.0)
Acid-Base Excess: 13.2 mmol/L — ABNORMAL HIGH (ref 0.0–2.0)
Bicarbonate: 31.7 mmol/L — ABNORMAL HIGH (ref 20.0–28.0)
Bicarbonate: 34.5 mmol/L — ABNORMAL HIGH (ref 20.0–28.0)
Bicarbonate: 33.9 mmol/L — ABNORMAL HIGH (ref 20.0–28.0)
DELIVERY SYSTEMS: POSITIVE
DRAWN BY: 28459
Delivery systems: POSITIVE
Drawn by: 234301
EXPIRATORY PAP: 6
Expiratory PAP: 6
FIO2: 40
FIO2: 40
FIO2: 50
INSPIRATORY PAP: 12
INSPIRATORY PAP: 14
MECHVT: 500 mL
Mechanical Rate: 16
Mode: POSITIVE
O2 SAT: 97.3 %
O2 SAT: 97.1 %
O2 Saturation: 93.4 %
PEEP/CPAP: 5 cmH2O
PO2 ART: 76.2 mmHg — AB (ref 83.0–108.0)
PO2 ART: 114 mmHg — AB (ref 83.0–108.0)
Patient temperature: 37
Patient temperature: 37
pCO2 arterial: 70.1 mmHg (ref 32.0–48.0)
pCO2 arterial: 72 mmHg (ref 32.0–48.0)
pCO2 arterial: 58 mmHg — ABNORMAL HIGH (ref 32.0–48.0)
pH, Arterial: 7.334 — ABNORMAL LOW (ref 7.350–7.450)
pH, Arterial: 7.355 (ref 7.350–7.450)
pH, Arterial: 7.416 (ref 7.350–7.450)
pO2, Arterial: 103 mmHg (ref 83.0–108.0)

## 2017-04-13 LAB — ECHOCARDIOGRAM COMPLETE
CHL CUP DOP CALC LVOT VTI: 25.4 cm
CHL CUP STROKE VOLUME: 58 mL
E/e' ratio: 7.79
EWDT: 271 ms
FS: 31 % (ref 28–44)
HEIGHTINCHES: 71 in
IV/PV OW: 1.03
LA diam end sys: 35 mm
LA vol A4C: 53.1 ml
LA vol: 53.3 mL
LADIAMINDEX: 1.67 cm/m2
LASIZE: 35 mm
LAVOLIN: 25.5 mL/m2
LDCA: 2.54 cm2
LV E/e' medial: 7.79
LV E/e'average: 7.79
LV TDI E'LATERAL: 9.46
LV dias vol index: 45 mL/m2
LV sys vol index: 17 mL/m2
LV sys vol: 36 mL
LVDIAVOL: 94 mL (ref 62–150)
LVELAT: 9.46 cm/s
LVOT diameter: 18 mm
LVOT peak grad rest: 6 mmHg
LVOTPV: 120 cm/s
LVOTSV: 65 mL
MV Dec: 271
MV pk A vel: 114 m/s
MV pk E vel: 73.7 m/s
MVPG: 2 mmHg
PW: 12.8 mm — AB (ref 0.6–1.1)
RV LATERAL S' VELOCITY: 12.3 cm/s
RV TAPSE: 23.8 mm
Simpson's disk: 62
TDI e' medial: 6.85
WEIGHTICAEL: 3051.17 [oz_av]

## 2017-04-13 LAB — GLUCOSE, CAPILLARY
GLUCOSE-CAPILLARY: 199 mg/dL — AB (ref 65–99)
GLUCOSE-CAPILLARY: 222 mg/dL — AB (ref 65–99)
Glucose-Capillary: 233 mg/dL — ABNORMAL HIGH (ref 65–99)
Glucose-Capillary: 193 mg/dL — ABNORMAL HIGH (ref 65–99)

## 2017-04-13 LAB — CBC
HCT: 46.8 % (ref 39.0–52.0)
HEMOGLOBIN: 14.5 g/dL (ref 13.0–17.0)
MCH: 26.5 pg (ref 26.0–34.0)
MCHC: 31 g/dL (ref 30.0–36.0)
MCV: 85.4 fL (ref 78.0–100.0)
Platelets: 219 10*3/uL (ref 150–400)
RBC: 5.48 MIL/uL (ref 4.22–5.81)
RDW: 17.3 % — ABNORMAL HIGH (ref 11.5–15.5)
WBC: 11.6 10*3/uL — ABNORMAL HIGH (ref 4.0–10.5)

## 2017-04-13 LAB — HEMOGLOBIN A1C
HEMOGLOBIN A1C: 8.3 % — AB (ref 4.8–5.6)
MEAN PLASMA GLUCOSE: 191.51 mg/dL

## 2017-04-13 LAB — BASIC METABOLIC PANEL
ANION GAP: 8 (ref 5–15)
BUN: 22 mg/dL — ABNORMAL HIGH (ref 6–20)
CHLORIDE: 99 mmol/L — AB (ref 101–111)
CO2: 35 mmol/L — AB (ref 22–32)
Calcium: 9 mg/dL (ref 8.9–10.3)
Creatinine, Ser: 0.67 mg/dL (ref 0.61–1.24)
GFR calc non Af Amer: >60 mL/min (ref >60)
Glucose, Bld: 214 mg/dL — ABNORMAL HIGH (ref 65–99)
Potassium: 4.4 mmol/L (ref 3.5–5.1)
Sodium: 142 mmol/L (ref 135–145)

## 2017-04-13 LAB — MRSA PCR SCREENING: MRSA BY PCR: NEGATIVE

## 2017-04-13 MED ORDER — DEXTROSE 5 % IV SOLN
1.0000 g | INTRAVENOUS | Status: DC
  Administered 2017-04-13 – 2017-04-21 (×9): 1 g via INTRAVENOUS
  Filled 2017-04-13 (×9): qty 10

## 2017-04-13 MED ORDER — FENTANYL CITRATE (PF) 100 MCG/2ML IJ SOLN
INTRAMUSCULAR | Status: AC
  Filled 2017-04-13: qty 2

## 2017-04-13 MED ORDER — PROPOFOL 1000 MG/100ML IV EMUL
5.0000 ug/kg/min | INTRAVENOUS | Status: DC
  Administered 2017-04-13 – 2017-04-14 (×6): 40 ug/kg/min via INTRAVENOUS
  Administered 2017-04-15: 20 ug/kg/min via INTRAVENOUS
  Administered 2017-04-15: 40 ug/kg/min via INTRAVENOUS
  Administered 2017-04-15: 30 ug/kg/min via INTRAVENOUS
  Administered 2017-04-15 – 2017-04-18 (×13): 40 ug/kg/min via INTRAVENOUS
  Administered 2017-04-18 (×3): 35 ug/kg/min via INTRAVENOUS
  Administered 2017-04-18 – 2017-04-19 (×2): 40 ug/kg/min via INTRAVENOUS
  Administered 2017-04-19: 35 ug/kg/min via INTRAVENOUS
  Administered 2017-04-19 (×2): 40 ug/kg/min via INTRAVENOUS
  Administered 2017-04-19: 35 ug/kg/min via INTRAVENOUS
  Administered 2017-04-20 (×2): 45 ug/kg/min via INTRAVENOUS
  Filled 2017-04-13 (×33): qty 100

## 2017-04-13 MED ORDER — LORAZEPAM 2 MG/ML IJ SOLN
0.2500 mg | Freq: Once | INTRAMUSCULAR | Status: AC
  Administered 2017-04-13: 0.25 mg via INTRAVENOUS
  Filled 2017-04-13: qty 1

## 2017-04-13 MED ORDER — FUROSEMIDE 10 MG/ML IJ SOLN
40.0000 mg | Freq: Once | INTRAMUSCULAR | Status: AC
  Administered 2017-04-13: 40 mg via INTRAVENOUS
  Filled 2017-04-13: qty 4

## 2017-04-13 MED ORDER — LORAZEPAM 2 MG/ML IJ SOLN
INTRAMUSCULAR | Status: AC
  Filled 2017-04-13: qty 1

## 2017-04-13 MED ORDER — HYDRALAZINE HCL 20 MG/ML IJ SOLN
10.0000 mg | Freq: Four times a day (QID) | INTRAMUSCULAR | Status: DC | PRN
  Administered 2017-04-13 – 2017-04-19 (×2): 10 mg via INTRAVENOUS
  Filled 2017-04-13 (×2): qty 1

## 2017-04-13 MED ORDER — LORAZEPAM 2 MG/ML IJ SOLN
2.0000 mg | Freq: Once | INTRAMUSCULAR | Status: AC
  Administered 2017-04-13: 2 mg via INTRAVENOUS

## 2017-04-13 MED ORDER — FENTANYL CITRATE (PF) 100 MCG/2ML IJ SOLN
100.0000 ug | Freq: Once | INTRAMUSCULAR | Status: AC
Start: 2017-04-13 — End: 2017-04-13
  Administered 2017-04-13: 100 ug via INTRAVENOUS

## 2017-04-13 NOTE — Progress Notes (Signed)
*  PRELIMINARY RESULTS* Echocardiogram 2D Echocardiogram has been performed.  Samuel Germany 04/13/2017, 1:42 PM

## 2017-04-13 NOTE — Progress Notes (Signed)
PROGRESS NOTE    Andrew [name]  ZLD:[id number] DOB: [birth year] DOA: 04/12/2017 PCP: System, Provider Not In     Brief Narrative:  Essential man admitted to the hospital on 8/8 due to shortness of breath and unresponsiveness presumed due to COPD with acute exacerbation. He required BiPAP upon admission and has remained on BiPAP overnight. Today he is more awake and is following commands.   Assessment & Plan:   Principal Problem:   Acute respiratory failure with hypoxia and hypercapnia (HCC) Active Problems:   Diabetes mellitus (HCC)   Hyperlipidemia   TOBACCO ABUSE   Essential hypertension   ATRIAL FIBRILLATION   CHRONIC OBSTRUCTIVE PULMONARY DISEASE, ACUTE EXACERBATION   Acute hypoxic and hypercapnic respiratory failure -Continue noninvasive positive pressure ventilation for now. -Appreciate Dr. Luan Pulling input and recommendations: He believes chest x-ray is more indicative of pneumonia and as such has added Rocephin to azithromycin. -Chest x-ray today also shows a degree of pulmonary vascular congestion. Have given him 40 mg of IV Lasix and have requested a 2-D echo. -Remains at high risk for intubation given only marginal improvement clinically and in blood gas overnight. Daughter agrees with intubation if needed.   COPD with acute exacerbation -Continue steroids, nebs at current dose, less wheezing today. -See above for more details.  Tobacco abuse -Will need to discuss cessation once more alert.  Atrial fibrillation -Rate controlled, not chronically anticoagulated, continue aspirin.  Type 2 diabetes -Fair control on sliding scale.  Hypertension -Fair control, will add when necessary hydralazine. -Resume oral meds once more alert.  GERD -Resume omeprazole once more alert and off BiPAP.  Hyperlipidemia -Resume statin once more alert and off BiPAP.   DVT prophylaxis: lovenox Code Status: full code Family Communication: daughter at bedside updated on plan of care  and all questions answered  Disposition Plan: To be determined pending medical stability  Consultants:   Pulmonary, Dr. Luan Pulling  Procedures:   None  Antimicrobials:  Anti-infectives    Start     Dose/Rate Route Frequency Ordered Stop   04/13/17 0745  cefTRIAXone (ROCEPHIN) 1 g in dextrose 5 % 50 mL IVPB     1 g 100 mL/hr over 30 Minutes Intravenous Every 24 hours 04/13/17 0743     04/12/17 1915  azithromycin (ZITHROMAX) 500 mg in dextrose 5 % 250 mL IVPB     500 mg 250 mL/hr over 60 Minutes Intravenous Daily-1800 04/12/17 1908 04/17/17 1759       Subjective: More alert, can follow simple commands, still drowsy  Objective: Vitals:   04/13/17 0814 04/13/17 0900 04/13/17 0935 04/13/17 1100  BP:  (!) 167/84  (!) 170/85  Pulse:  80 79 85  Resp:  16 18 20   Temp:      TempSrc:      SpO2: 97% 97% 95% 97%  Weight:      Height:        Intake/Output Summary (Last 24 hours) at 04/13/17 1121 Last data filed at 04/13/17 1033  Gross per 24 hour  Intake                0 ml  Output             2350 ml  Net            -2350 ml   Filed Weights   04/12/17 1143 04/12/17 1830  Weight: 78 kg (172 lb) 86.5 kg (190 lb 11.2 oz)    Examination:  General exam: awake, can follow simple  commands Respiratory system: minimal bilateral exp wheezes and coarse breath sounds. Cardiovascular system:RRR. No murmurs, rubs, gallops. Gastrointestinal system: Abdomen is nondistended, soft and nontender. No organomegaly or masses felt. Normal bowel sounds heard. Central nervous system: Alert and oriented. No focal neurological deficits. Extremities: No C/C/E, +pedal pulses Skin: No rashes, lesions or ulcers Psychiatry: Unable to fully assess at present given resp failure and altered mental state    Data Reviewed: I have personally reviewed following labs and imaging studies  CBC:  Recent Labs Lab 04/12/17 1200 04/13/17 0605  WBC 9.2 11.6*  NEUTROABS 7.2  --   HGB 13.6 14.5  HCT 44.1  46.8  MCV 85.5 85.4  PLT 216 035   Basic Metabolic Panel:  Recent Labs Lab 04/12/17 1200 04/13/17 0605  NA 141 142  K 4.4 4.4  CL 97* 99*  CO2 36* 35*  GLUCOSE 162* 214*  BUN 16 22*  CREATININE 0.72 0.67  CALCIUM 8.9 9.0   GFR: Estimated Creatinine Clearance: 81.1 mL/min (by C-G formula based on SCr of 0.67 mg/dL). Liver Function Tests:  Recent Labs Lab 04/12/17 1200  AST 28  ALT 40  ALKPHOS 87  BILITOT 0.7  PROT 6.9  ALBUMIN 3.1*   No results for input(s): LIPASE, AMYLASE in the last 168 hours. No results for input(s): AMMONIA in the last 168 hours. Coagulation Profile: No results for input(s): INR, PROTIME in the last 168 hours. Cardiac Enzymes:  Recent Labs Lab 04/12/17 1200  TROPONINI 0.03*   BNP (last 3 results) No results for input(s): PROBNP in the last 8760 hours. HbA1C: No results for input(s): HGBA1C in the last 72 hours. CBG:  Recent Labs Lab 04/12/17 1152 04/12/17 1738 04/12/17 2004 04/13/17 0810  GLUCAP 153* 235* 246* 199*   Lipid Profile: No results for input(s): CHOL, HDL, LDLCALC, TRIG, CHOLHDL, LDLDIRECT in the last 72 hours. Thyroid Function Tests: No results for input(s): TSH, T4TOTAL, FREET4, T3FREE, THYROIDAB in the last 72 hours. Anemia Panel: No results for input(s): VITAMINB12, FOLATE, FERRITIN, TIBC, IRON, RETICCTPCT in the last 72 hours. Urine analysis:    Component Value Date/Time   COLORURINE AMBER (A) 05/05/2013 1621   APPEARANCEUR CLEAR 05/05/2013 1621   LABSPEC 1.025 05/05/2013 1621   PHURINE 5.5 05/05/2013 1621   GLUCOSEU NEGATIVE 05/05/2013 1621   HGBUR NEGATIVE 05/05/2013 1621   HGBUR negative 03/31/2009 1514   BILIRUBINUR SMALL (A) 05/05/2013 1621   KETONESUR NEGATIVE 05/05/2013 1621   PROTEINUR NEGATIVE 05/05/2013 1621   UROBILINOGEN 1.0 05/05/2013 1621   NITRITE NEGATIVE 05/05/2013 1621   LEUKOCYTESUR NEGATIVE 05/05/2013 1621   Sepsis Labs: @LABRCNTIP (procalcitonin:4,lacticidven:4)  ) Recent  Results (from the past 240 hour(s))  MRSA PCR Screening     Status: None   Collection Time: 04/12/17  3:06 PM  Result Value Ref Range Status   MRSA by PCR NEGATIVE NEGATIVE Final    Comment:        The GeneXpert MRSA Assay (FDA approved for NASAL specimens only), is one component of a comprehensive MRSA colonization surveillance program. It is not intended to diagnose MRSA infection nor to guide or monitor treatment for MRSA infections.          Radiology Studies: Dg Chest Port 1 View  Result Date: 04/13/2017 CLINICAL DATA:  COPD EXAM: PORTABLE CHEST 1 VIEW COMPARISON:  April 12, 2017 FINDINGS: There is no appreciable edema or consolidation. There is a small left pleural effusion with slight right base atelectasis. There is cardiomegaly with pulmonary venous hypertension. No  adenopathy. No bone lesions. IMPRESSION: Persistent pulmonary vascular congestion. Small left pleural effusion. Mild right base atelectasis. No frank edema or consolidation. Electronically Signed   By: Lowella Grip III M.D.   On: 04/13/2017 07:59   Dg Chest Portable 1 View  Result Date: 04/12/2017 CLINICAL DATA:  Hypoxia EXAM: PORTABLE CHEST 1 VIEW COMPARISON:  April 20, 2010 FINDINGS: There is atelectatic change in the left base. There is no frank edema or consolidation. There is cardiomegaly with mild pulmonary venous hypertension. No adenopathy. No bone lesions. IMPRESSION: Pulmonary vascular congestion without frank edema or consolidation. Left base atelectasis. Electronically Signed   By: Lowella Grip III M.D.   On: 04/12/2017 12:14        Scheduled Meds: . aspirin  300 mg Rectal Daily  . enoxaparin (LOVENOX) injection  40 mg Subcutaneous Q24H  . insulin aspart  0-5 Units Subcutaneous QHS  . insulin aspart  0-9 Units Subcutaneous TID WC  . ipratropium  0.5 mg Nebulization Q6H  . methylPREDNISolone (SOLU-MEDROL) injection  80 mg Intravenous Q8H  . nicotine  14 mg Transdermal Daily    Continuous Infusions: . sodium chloride 75 mL/hr at 04/12/17 2015  . azithromycin Stopped (04/12/17 2133)  . cefTRIAXone (ROCEPHIN)  IV Stopped (04/13/17 0848)     LOS: 1 day    Time spent: 35 minutes. Greater than 50% of this time was spent in direct contact with the patient coordinating care.     Lelon Frohlich, MD [facility] Pager [phone]  If 7PM-7AM, please contact night-coverage [email or web address] Password [email or web address] 04/13/2017, 11:21 AM

## 2017-04-13 NOTE — Progress Notes (Signed)
Urinary Catheter present upon AM assessment. RN added Urinary Catheter in chart. Unknown person of insertion or time. Catheter Order also not in chart. RN paged MD for order.  Margaret Pyle, RN

## 2017-04-13 NOTE — ED Provider Notes (Signed)
[place]  Department of Emergency Medicine   Code Blue CONSULT NOTE  Chief Complaint: Cardiac arrest/unresponsive   Level V Caveat: Unresponsive  History of present illness: I was contacted by the hospital for Respiratory failure with htpercapnea upstairs and presented to the patient's bedside.    ROS: Unable to obtain, Level V caveat  Scheduled Meds: . aspirin  300 mg Rectal Daily  . enoxaparin (LOVENOX) injection  40 mg Subcutaneous Q24H  . insulin aspart  0-5 Units Subcutaneous QHS  . insulin aspart  0-9 Units Subcutaneous TID WC  . ipratropium  0.5 mg Nebulization Q6H  . methylPREDNISolone (SOLU-MEDROL) injection  80 mg Intravenous Q8H  . nicotine  14 mg Transdermal Daily   Continuous Infusions: . sodium chloride 75 mL/hr at 04/13/17 1126  . azithromycin Stopped (04/13/17 1926)  . cefTRIAXone (ROCEPHIN)  IV Stopped (04/13/17 0848)   PRN Meds:.acetaminophen **OR** acetaminophen, albuterol, hydrALAZINE, ondansetron **OR** ondansetron (ZOFRAN) IV, senna-docusate Past Medical History:  Diagnosis Date  . A-fib ([place])   . Abdominal pain   . Adenomatous colon polyp   . Arthritis   . Asthma   . CAD (coronary artery disease)   . Cancer ([place])    skin, nose  . Cataract    bilateral  . Chronic LBP   . COPD (chronic obstructive pulmonary disease) ([place])   . Decreased libido   . Diverticulosis of colon   . Esophageal stricture   . Fatigue   . GERD (gastroesophageal reflux disease)   . History of small bowel obstruction   . Hyperlipidemia   . Hypertension   . Hypertrophy of prostate with urinary obstruction and other lower urinary tract symptoms (LUTS)   . Palpitations   . Peri-rectal abscess   . Personal history of renal calculi   . Prostatitis, acute   . Tobacco abuse    Past Surgical History:  Procedure Laterality Date  . CATARACT EXTRACTION  04/2010   bilateral  . HERNIA REPAIR    . LUMBAR LAMINECTOMY     with Harrington rods  . PENILE  PROSTHESIS PLACEMENT    . TONSILLECTOMY     Social History   Social History  . Marital status: Divorced    Spouse name: N/A  . Number of children: N/A  . Years of education: N/A   Occupational History  . Retired Retired   Social History Main Topics  . Smoking status: Current Every Day Smoker    Types: Cigarettes  . Smokeless tobacco: Never Used  . Alcohol use No  . Drug use: No  . Sexual activity: Not on file   Other Topics Concern  . Not on file   Social History Narrative  . No narrative on file   Allergies  Allergen Reactions  . Procaine Other (See Comments) and Shortness Of Breath    Syncope  . Procaine Hcl Anaphylaxis and Swelling  . Atorvastatin Palpitations    REACTION: Heartburn    Last set of Vital Signs (not current) Vitals:   04/13/17 2000 04/13/17 2039  BP:    Pulse:    Resp:    Temp: 98.1 F (36.7 C)   SpO2:  95%      Physical Exam Gen: Lethargic Cardiovascular: pulseless  Resp:  Breath sounds equal bilaterally Abd: distended  Neuro: normal tone HEENT: No blood in posterior pharynx, gag reflex present Neck: No crepitus  Musculoskeletal: No deformity  Skin: warm, wet  Procedures  INTUBATION Performed by: Daleen Bo L Required items: required blood  products, implants, devices, and special equipment available Patient identity confirmed: provided demographic data and hospital-assigned identification number Time out: Immediately prior to procedure a "time out" was called to verify the correct patient, procedure, equipment, support staff and site/side marked as required. Indications: respiratory failure Intubation method: Glidescope Preoxygenation: 100% BVM Sedatives: Etomidate Paralytic: Succinyl Choline Tube Size: 7.5 cuffed Post-procedure assessment: chest rise and ETCO2 monitor Breath sounds: equal and absent over the epigastrium Tube secured by Respiratory Therapy Patient tolerated the procedure well with no immediate  complications.  CRITICAL CARE Performed by: Richarda Blade Total critical care time: 20 minutes Critical care time was exclusive of separately billable procedures and treating other patients. Critical care was necessary to treat or prevent imminent or life-threatening deterioration. Critical care was time spent personally by me on the following activities: development of treatment plan with patient and/or surrogate as well as nursing, discussions with consultants, evaluation of patient's response to treatment, examination of patient, obtaining history from patient or surrogate, ordering and performing treatments and interventions, ordering and review of laboratory studies, ordering and review of radiographic studies, pulse oximetry and re-evaluation of patient's condition.    Medical Decision making  Respiratory Failure with Hypercapnea.  Assessment and Plan  Continued care by PCCM in the ICU   Daleen Bo, MD 04/13/17 2337

## 2017-04-13 NOTE — Consult Note (Signed)
Consult requested by: Dr. Jerilee Hoh Consult requested for: Respiratory failure  HPI: This is a 76 year old who has multiple medical problems including severe COPD still smoking according to his daughter. His daughter who is present at bedside and the medical record provide the history because he is on BiPAP and sleeping. His other medical history is positive for atrial fibrillation coronary disease chronic back pain, reflux, BPH hypertension. He's been sick for about 3 or 4 days with increasing somnolence and has developed fairly marked swelling of his extremities. He also had some swelling in his abdomen. He became less responsive had some gurgling respirations and was coughing up mostly clear thick sputum and was brought to the emergency room because of his poor level of response. In the emergency department he was noted to have acute hypoxic and hypercapnic respiratory failure and was started on BiPAP. His PCO2 has improved on BiPAP. He is on steroids and azithromycin. It looked on his chest x-ray from the eighth that I personally reviewed like he had some atelectasis in the left lower lobe and had what looked like some overall volume overload. Chest x-ray this morning looks more like pneumonia in the left lower lobe.  Past Medical History:  Diagnosis Date  . A-fib ([place])   . Abdominal pain   . Adenomatous colon polyp   . Arthritis   . Asthma   . CAD (coronary artery disease)   . Cancer ([place])    skin, nose  . Cataract    bilateral  . Chronic LBP   . COPD (chronic obstructive pulmonary disease) ([place])   . Decreased libido   . Diverticulosis of colon   . Esophageal stricture   . Fatigue   . GERD (gastroesophageal reflux disease)   . History of small bowel obstruction   . Hyperlipidemia   . Hypertension   . Hypertrophy of prostate with urinary obstruction and other lower urinary tract symptoms (LUTS)   . Palpitations   . Peri-rectal abscess   . Personal history of renal calculi   .  Prostatitis, acute   . Tobacco abuse      Family History  Problem Relation Age of Onset  . Heart disease Brother        CAD  . Diabetes Brother   . COPD Brother   . Emphysema Brother      Social History   Social History  . Marital status: Divorced    Spouse name: N/A  . Number of children: N/A  . Years of education: N/A   Occupational History  . Retired Retired   Social History Main Topics  . Smoking status: Current Every Day Smoker    Types: Cigarettes  . Smokeless tobacco: Never Used  . Alcohol use No  . Drug use: No  . Sexual activity: Not Asked   Other Topics Concern  . None   Social History Narrative  . None     ROS: Unobtainable    Objective: Vital signs in last 24 hours: Temp:  [97.1 F (36.2 C)-99.3 F (37.4 C)] 97.1 F (36.2 C) (08/09 0400) Pulse Rate:  [75-104] 76 (08/09 0500) Resp:  [17-45] 17 (08/09 0500) BP: (124-167)/(55-95) 162/86 (08/09 0500) SpO2:  [81 %-100 %] 94 % (08/09 0500) FiO2 (%):  [40 %] 40 % (08/09 0126) Weight:  [78 kg (172 lb)-86.5 kg (190 lb 11.2 oz)] 86.5 kg (190 lb 11.2 oz) (08/08 1830) Weight change:     Intake/Output from previous day: 08/08 0701 - 08/09 0700 In: -  Out: 1500 [Urine:1500]  PHYSICAL EXAM Constitutional: He is on BiPAP and sleeping he does open his eyes but goes right back to sleep. Eyes: Pupils do react. Ears nose mouth and throat: His hearing seems normal throat looks clear through the BiPAP mask. Cardiovascular: He is in sinus rhythm although he does have a history of atrial fib. I don't hear a gallop. Respiratory: His respiratory effort is maintained by the CPAP. He has prolonged expiratory phase. He has significant crackles in the left base. Gastrointestinal: His abdomen is soft obese with no masses. Skin: Warm and dry neurological: Sleepy but he will wake up. Musculoskeletal: Not able to assess. Psychiatric: Not able to assess  Lab Results: Basic Metabolic Panel:  Recent Labs  04/12/17 1200  04/13/17 0605  NA 141 142  K 4.4 4.4  CL 97* 99*  CO2 36* 35*  GLUCOSE 162* 214*  BUN 16 22*  CREATININE 0.72 0.67  CALCIUM 8.9 9.0   Liver Function Tests:  Recent Labs  04/12/17 1200  AST 28  ALT 40  ALKPHOS 87  BILITOT 0.7  PROT 6.9  ALBUMIN 3.1*   No results for input(s): LIPASE, AMYLASE in the last 72 hours. No results for input(s): AMMONIA in the last 72 hours. CBC:  Recent Labs  04/12/17 1200 04/13/17 0605  WBC 9.2 11.6*  NEUTROABS 7.2  --   HGB 13.6 14.5  HCT 44.1 46.8  MCV 85.5 85.4  PLT 216 219   Cardiac Enzymes:  Recent Labs  04/12/17 1200  TROPONINI 0.03*   BNP: No results for input(s): PROBNP in the last 72 hours. D-Dimer: No results for input(s): DDIMER in the last 72 hours. CBG:  Recent Labs  04/12/17 1152 04/12/17 1738 04/12/17 2004  GLUCAP 153* 235* 246*   Hemoglobin A1C: No results for input(s): HGBA1C in the last 72 hours. Fasting Lipid Panel: No results for input(s): CHOL, HDL, LDLCALC, TRIG, CHOLHDL, LDLDIRECT in the last 72 hours. Thyroid Function Tests: No results for input(s): TSH, T4TOTAL, FREET4, T3FREE, THYROIDAB in the last 72 hours. Anemia Panel: No results for input(s): VITAMINB12, FOLATE, FERRITIN, TIBC, IRON, RETICCTPCT in the last 72 hours. Coagulation: No results for input(s): LABPROT, INR in the last 72 hours. Urine Drug Screen: Drugs of Abuse  No results found for: LABOPIA, COCAINSCRNUR, LABBENZ, AMPHETMU, THCU, LABBARB  Alcohol Level: No results for input(s): ETH in the last 72 hours. Urinalysis: No results for input(s): COLORURINE, LABSPEC, PHURINE, GLUCOSEU, HGBUR, BILIRUBINUR, KETONESUR, PROTEINUR, UROBILINOGEN, NITRITE, LEUKOCYTESUR in the last 72 hours.  Invalid input(s): APPERANCEUR Misc. Labs:   ABGS:  Recent Labs  04/13/17 0426  PHART 7.334*  PO2ART 76.2*  HCO3 31.7*     MICROBIOLOGY: Recent Results (from the past 240 hour(s))  MRSA PCR Screening     Status: None   Collection  Time: 04/12/17  3:06 PM  Result Value Ref Range Status   MRSA by PCR NEGATIVE NEGATIVE Final    Comment:        The GeneXpert MRSA Assay (FDA approved for NASAL specimens only), is one component of a comprehensive MRSA colonization surveillance program. It is not intended to diagnose MRSA infection nor to guide or monitor treatment for MRSA infections.     Studies/Results: Dg Chest Portable 1 View  Result Date: 04/12/2017 CLINICAL DATA:  Hypoxia EXAM: PORTABLE CHEST 1 VIEW COMPARISON:  April 20, 2010 FINDINGS: There is atelectatic change in the left base. There is no frank edema or consolidation. There is cardiomegaly with mild pulmonary venous  hypertension. No adenopathy. No bone lesions. IMPRESSION: Pulmonary vascular congestion without frank edema or consolidation. Left base atelectasis. Electronically Signed   By: Lowella Grip III M.D.   On: 04/12/2017 12:14    Medications:  Prior to Admission:  Prescriptions Prior to Admission  Medication Sig Dispense Refill Last Dose  . albuterol (PROVENTIL HFA;VENTOLIN HFA) 108 (90 BASE) MCG/ACT inhaler Inhale 2 puffs into the lungs every 4 (four) hours as needed for wheezing.    Taking  . albuterol (PROVENTIL) (2.5 MG/3ML) 0.083% nebulizer solution Take 2.5 mg by nebulization every 4 (four) hours as needed for wheezing or shortness of breath.   Taking  . aspirin 325 MG tablet Take 325 mg by mouth daily.   04/11/2017 at Unknown time  . Cyanocobalamin (VITAMIN B-12) 2500 MCG SUBL Place 2,500 mcg under the tongue daily.   04/11/2017 at Unknown time  . cyclobenzaprine (FLEXERIL) 10 MG tablet Take 5 mg by mouth at bedtime.   04/11/2017 at Unknown time  . diltiazem (TIAZAC) 300 MG 24 hr capsule Take 300 mg by mouth daily.    04/11/2017 at Unknown time  . diphenhydrAMINE (BENADRYL) 25 mg capsule Take 25 mg by mouth at bedtime as needed.   04/11/2017 at Unknown time  . furosemide (LASIX) 20 MG tablet Take 40 mg by mouth daily.    04/11/2017 at Unknown time   . gabapentin (NEURONTIN) 400 MG capsule Take 400 mg by mouth 4 (four) times daily.   04/11/2017 at Unknown time  . glipiZIDE (GLUCOTROL) 5 MG tablet Take 5 mg by mouth daily before breakfast.   04/11/2017 at Unknown time  . ipratropium-albuterol (DUONEB) 0.5-2.5 (3) MG/3ML SOLN Take 3 mLs by nebulization QID.    04/11/2017 at Unknown time  . metoCLOPramide (REGLAN) 10 MG tablet Take 10 mg by mouth 4 (four) times daily.   04/11/2017 at Unknown time  . montelukast (SINGULAIR) 10 MG tablet Take 10 mg by mouth daily.   04/11/2017 at Unknown time  . omeprazole (PRILOSEC) 40 MG capsule Take 40 mg by mouth daily.   04/11/2017 at Unknown time  . rosuvastatin (CRESTOR) 20 MG tablet Take 1 tablet (20 mg total) by mouth daily. 30 tablet 3 04/11/2017 at Unknown time  . saxagliptin HCl (ONGLYZA) 5 MG TABS tablet Take 5 mg by mouth daily.   04/11/2017 at Unknown time  . tiZANidine (ZANAFLEX) 4 MG tablet Take 4 mg by mouth 3 (three) times daily.   04/11/2017 at Unknown time  . umeclidinium-vilanterol (ANORO ELLIPTA) 62.5-25 MCG/INH AEPB Inhale 1 puff into the lungs daily.   04/11/2017 at Unknown time  . cefdinir (OMNICEF) 300 MG capsule Take 300 mg by mouth 2 (two) times daily.   Completed Course at Unknown time  . insulin aspart (NOVOLOG) 100 UNIT/ML injection Per sliding scale   Taking  . metFORMIN (GLUMETZA) 1000 MG (MOD) 24 hr tablet Take 1,000 mg by mouth daily with breakfast.   Not Taking at Unknown time   Scheduled: . aspirin  300 mg Rectal Daily  . enoxaparin (LOVENOX) injection  40 mg Subcutaneous Q24H  . furosemide  40 mg Intravenous Once  . insulin aspart  0-5 Units Subcutaneous QHS  . insulin aspart  0-9 Units Subcutaneous TID WC  . ipratropium  0.5 mg Nebulization Q6H  . methylPREDNISolone (SOLU-MEDROL) injection  80 mg Intravenous Q8H  . nicotine  14 mg Transdermal Daily   Continuous: . sodium chloride 75 mL/hr at 04/12/17 2015  . azithromycin Stopped (04/12/17 2133)  .  cefTRIAXone (ROCEPHIN)  IV      YOF:[id number] **OR** acetaminophen, albuterol, ondansetron **OR** ondansetron (ZOFRAN) IV, senna-docusate  Assesment: He was admitted with acute hypoxic and hypercapnic respiratory failure. He has COPD exacerbation and he is continuing to smoke cigarettes. His PCO2 is better and his pH is better but he still very sleepy. He may have some element of heart failure based on his history. I think he has pneumonia in the left lower lobe so I've added Rocephin to the azithromycin the chart he been started. He is on nebulizer treatments and he is on Solu-Medrol so he's on good treatment. Principal Problem:   Acute respiratory failure with hypoxia and hypercapnia (HCC) Active Problems:   Diabetes mellitus (HCC)   Hyperlipidemia   TOBACCO ABUSE   Essential hypertension   ATRIAL FIBRILLATION   CHRONIC OBSTRUCTIVE PULMONARY DISEASE, ACUTE EXACERBATION    Plan: Add Rocephin. Continue other treatments. I've taken the liberty of ordering a echocardiogram. He is high risk to require intubation and mechanical ventilation.his daughter would agree to that if needed    LOS: 1 day   Andrew [name] L 04/13/2017, 7:58 AM

## 2017-04-13 NOTE — Progress Notes (Addendum)
CRITICAL VALUE ALERT  Critical Value: 70.1  Date & Time Notied:  0500 04/13/17  Provider Notified: DR Marin Comment  Orders Received/Actions taken: cont on bipap

## 2017-04-13 NOTE — Progress Notes (Signed)
Patient and family very tearful. Patient states he does not want tube down his throat., MD notified and discussing information with patient and family. Daughter wishes to speak to chaplain. Chaplain called. No response. Aniceto Boss NP (Palliative) called and said she will come by and talk with patient tomorrow morning.  Patient became agitated and restless. 0.25 mg of ativan given. Patient calm and talking with MD.  Margaret Pyle, RN

## 2017-04-13 NOTE — Progress Notes (Signed)
[facility] Progress Note Patient Name: LIVIO LEDWITH DOB: 05/26/1941 MRN: [id number]   Date of Service  04/13/2017  HPI/Events of Note  AECOPD - Failed BiPAP. Now intubated and ventilated. Post intubation CXR reveals ETT about 7 cm above carina. BP = 207/109 with HR = 101.   eICU Interventions  Will order: 1. Ventilator settings: 50%/PRVC 16/TV 500/P 5. 2. Advance ETT 3 cm and re-secure.  3. Propofol IV infusion. Titrate to RASS = 0 to -1.  4. ABG at 11:15 PM.      Intervention Category Major Interventions: Respiratory failure - evaluation and management  Nyelah Emmerich Eugene 04/13/2017, 10:17 PM

## 2017-04-13 NOTE — Progress Notes (Signed)
Repeat ABG this afternoon shows no significant improvement and possibly some worsening with a PCO2 now of 72. Patient has also had episodes of intermittent drowsiness throughout the day. Discussed with Dr. Luan Pulling and we agree that best plan of action at this point would be intubation and mechanical ventilation. Came to discuss with patient and daughter at bedside. Patient has just received some IV sedation and a such is unable to participate in our discussion. Daughter is quite tearful. She is adamant that her father remain a full code, however she wishes to delay the intubation as much as possible. Her concern is that she has been told in the past that he would be a difficult extubation if he ever needed to be intubated. I have explained to daughter that the recommendation from the medical team is to have him intubated right now, however if she refuses we can keep him on BiPAP tonight and recheck ABG tomorrow morning and proceed with intubation tomorrow if no improvement in blood gas. However with this option we run the risk of him acutely decompensating overnight and requiring emergent intubation. This is what she wishes to do at this time. We'll continue to follow closely.  Domingo Mend, MD [facility] Pager: [phone]

## 2017-04-14 ENCOUNTER — Inpatient Hospital Stay (HOSPITAL_COMMUNITY): Payer: Medicare PPO

## 2017-04-14 LAB — BLOOD GAS, ARTERIAL
Acid-Base Excess: 12.3 mmol/L — ABNORMAL HIGH (ref 0.0–2.0)
BICARBONATE: 34.7 mmol/L — AB (ref 20.0–28.0)
DRAWN BY: 28459
FIO2: 50
MECHVT: 500 mL
Mechanical Rate: 16
O2 SAT: 97.8 %
PEEP: 5 cmH2O
PH ART: 7.426 (ref 7.350–7.450)
Patient temperature: 37
pCO2 arterial: 57.7 mmHg — ABNORMAL HIGH (ref 32.0–48.0)
pO2, Arterial: 122 mmHg — ABNORMAL HIGH (ref 83.0–108.0)

## 2017-04-14 LAB — GLUCOSE, CAPILLARY
GLUCOSE-CAPILLARY: 220 mg/dL — AB (ref 65–99)
GLUCOSE-CAPILLARY: 225 mg/dL — AB (ref 65–99)
Glucose-Capillary: 204 mg/dL — ABNORMAL HIGH (ref 65–99)
Glucose-Capillary: 233 mg/dL — ABNORMAL HIGH (ref 65–99)
Glucose-Capillary: 275 mg/dL — ABNORMAL HIGH (ref 65–99)
Glucose-Capillary: 285 mg/dL — ABNORMAL HIGH (ref 65–99)

## 2017-04-14 LAB — MAGNESIUM
MAGNESIUM: 2.3 mg/dL (ref 1.7–2.4)
MAGNESIUM: 2.3 mg/dL (ref 1.7–2.4)

## 2017-04-14 LAB — PHOSPHORUS
PHOSPHORUS: 1.9 mg/dL — AB (ref 2.5–4.6)
Phosphorus: 2.7 mg/dL (ref 2.5–4.6)

## 2017-04-14 LAB — TRIGLYCERIDES: TRIGLYCERIDES: 173 mg/dL — AB (ref <150)

## 2017-04-14 MED ORDER — ORAL CARE MOUTH RINSE
15.0000 mL | Freq: Four times a day (QID) | OROMUCOSAL | Status: DC
  Administered 2017-04-14 – 2017-04-22 (×33): 15 mL via OROMUCOSAL

## 2017-04-14 MED ORDER — PANTOPRAZOLE SODIUM 40 MG IV SOLR
40.0000 mg | INTRAVENOUS | Status: DC
  Administered 2017-04-14 – 2017-04-21 (×8): 40 mg via INTRAVENOUS
  Filled 2017-04-14 (×8): qty 40

## 2017-04-14 MED ORDER — ORAL CARE MOUTH RINSE: 15.0000 mL | Freq: Four times a day (QID) | OROMUCOSAL | Status: DC

## 2017-04-14 MED ORDER — INSULIN ASPART 100 UNIT/ML ~~LOC~~ SOLN
2.0000 [IU] | SUBCUTANEOUS | Status: DC
  Administered 2017-04-14 (×2): 6 [IU] via SUBCUTANEOUS
  Administered 2017-04-15 (×4): 4 [IU] via SUBCUTANEOUS
  Administered 2017-04-15: 2 [IU] via SUBCUTANEOUS
  Administered 2017-04-15 – 2017-04-16 (×3): 6 [IU] via SUBCUTANEOUS
  Administered 2017-04-16: 2 [IU] via SUBCUTANEOUS
  Administered 2017-04-16: 6 [IU] via SUBCUTANEOUS
  Administered 2017-04-16 – 2017-04-17 (×2): 4 [IU] via SUBCUTANEOUS
  Administered 2017-04-17 (×4): 6 [IU] via SUBCUTANEOUS
  Administered 2017-04-17: 2 [IU] via SUBCUTANEOUS
  Administered 2017-04-18 (×4): 4 [IU] via SUBCUTANEOUS
  Administered 2017-04-18 – 2017-04-19 (×3): 6 [IU] via SUBCUTANEOUS
  Administered 2017-04-19: 2 [IU] via SUBCUTANEOUS
  Administered 2017-04-19: 4 [IU] via SUBCUTANEOUS
  Administered 2017-04-19: 6 [IU] via SUBCUTANEOUS
  Administered 2017-04-19 (×2): 4 [IU] via SUBCUTANEOUS
  Administered 2017-04-20 (×3): 6 [IU] via SUBCUTANEOUS
  Administered 2017-04-20 (×2): 4 [IU] via SUBCUTANEOUS
  Administered 2017-04-21: 6 [IU] via SUBCUTANEOUS
  Administered 2017-04-21 (×2): 2 [IU] via SUBCUTANEOUS

## 2017-04-14 MED ORDER — INSULIN GLARGINE 100 UNIT/ML ~~LOC~~ SOLN
5.0000 [IU] | Freq: Every day | SUBCUTANEOUS | Status: DC
  Administered 2017-04-14 – 2017-04-23 (×11): 5 [IU] via SUBCUTANEOUS
  Filled 2017-04-14 (×11): qty 0.05

## 2017-04-14 MED ORDER — CHLORHEXIDINE GLUCONATE 0.12% ORAL RINSE (MEDLINE KIT)
15.0000 mL | Freq: Two times a day (BID) | OROMUCOSAL | Status: DC
  Administered 2017-04-13 – 2017-04-22 (×19): 15 mL via OROMUCOSAL

## 2017-04-14 MED ORDER — CHLORHEXIDINE GLUCONATE 0.12% ORAL RINSE (MEDLINE KIT): 15.0000 mL | Freq: Two times a day (BID) | OROMUCOSAL | Status: DC

## 2017-04-14 MED ORDER — PRO-STAT SUGAR FREE PO LIQD
30.0000 mL | Freq: Two times a day (BID) | ORAL | Status: DC
  Administered 2017-04-14 – 2017-04-23 (×17): 30 mL
  Filled 2017-04-14 (×18): qty 30

## 2017-04-14 MED ORDER — VITAL HIGH PROTEIN PO LIQD
1000.0000 mL | ORAL | Status: DC
  Administered 2017-04-14: 1000 mL
  Administered 2017-04-15: 08:00:00
  Administered 2017-04-15 (×2): 1000 mL
  Administered 2017-04-15 (×8)
  Administered 2017-04-16 – 2017-04-20 (×5): 1000 mL
  Filled 2017-04-14 (×13): qty 1000

## 2017-04-14 MED FILL — Medication: Qty: 1 | Status: AC

## 2017-04-14 NOTE — Progress Notes (Signed)
In to see patient at this time, patient noted to be tachyphenic, restless, and poor toleration of bi-pap. Patient stated "I can't get enough air." Greeted by daughter, and had discussion about patients current status. Prior one hour before MD Jerilee Hoh expressed concern for potential decompensation overnight. Dialogue between both daughters, son in law, and addressed any concerns that MD Luan Pulling or MD Jerilee Hoh had not addressed.  The patient's daugther (POA) expressed that she did not want to see her father suffering, and she wanted to help him from fighting to gasp air.  The patients mentation status was assessed by two RN Dustin Folks RN and Gershon Cull RN) to ensure the patient was unable to appropriately engage in conversation surrounding intubation.  The patient stated it was 1914, unable to recall month, birth year, or current president.  The family was explained in great detail via myself, RT, house supervisor, and MD Jerilee Hoh of the significance of the current situation.  At this time the patients daughter would like to proceed with intubating the patient to prevent further increased respiratory efforts in the current setting of PNA and advanced staged COPD. The daughter has expressed that she does not want to see him restless and fighting like this anymore tonight. The patient and family was educated on sedation, intubation process, offered the chance to ask any questions. The family at this time does not have any further questions.  MD Kim covering for primary care group notified of family decision, and CCM to manage vent.  MD Iran Ouch notified, and ok to proceed with intubation. House supervisor notified, room set up for intubation, and ER MD Eulis Foster to come up shortly to intubate patient.  2159- Patient intubated using etomidate, succinylcholine, without complication. CXR conformation. CCM confirmed placement.   2230- Family to see patient after procedure, no questions at this time. Family to go  home at this time, will call if any status change.

## 2017-04-14 NOTE — Progress Notes (Signed)
Initial Nutrition Assessment   INTERVENTION:  Vital High Protein @ 70ml/hr  (960 ml q 24 hr)  and 30 ml Prostat BID.  Tube feeding regimen provides 1160 kcal,  114  grams of protein, and  803 ml of H2O.     (Total caloric intake including propofol  Meets 98% of energy and 100% protein needs),  NUTRITION DIAGNOSIS:   Inadequate oral intake related to inability to eat as evidenced by NPO status (intubated).   GOAL:   Provide needs based on ASPEN/SCCM guidelines   MONITOR:   Vent status, TF tolerance, I & O's, Weight trends  REASON FOR ASSESSMENT:   Consult, Ventilator Enteral/tube feeding initiation and management   ASSESSMENT:  Patient is currently intubated on ventilator support. He presents with COPD exacerbation. He has a hx of DM, HTN, GERD and HLD.  MV: 8.7 L/min Temp (24hrs), Avg:98.3 F (36.8 C), Min:98.1 F (36.7 C), Max:98.9 F (37.2 C)  Propofol: 20.8 ml/hr -provides 554 kcal lipids every 24 hrs at current rate.    Recent Labs Lab 04/12/17 1200 04/13/17 0605 04/14/17 0446  NA 141 142  --   K 4.4 4.4  --   CL 97* 99*  --   CO2 36* 35*  --   BUN 16 22*  --   CREATININE 0.72 0.67  --   CALCIUM 8.9 9.0  --   MG  --   --  2.3  PHOS  --   --  1.9*  GLUCOSE 162* 214*  --    Labs and Meds reviewed. Mag-1.9, Phos.-WNL  CBG (last 3)   Recent Labs  04/13/17 1731 04/13/17 2128 04/14/17 0745  GLUCAP 193* 222* 204*       Diet Order:  Diet NPO time specified  Skin:  Reviewed, no issues  Last BM:  8/9 medium   Height:   Ht Readings from Last 1 Encounters:  04/14/17 5\' 11"  (1.803 m)    Weight:   Wt Readings from Last 1 Encounters:  04/14/17 183 lb 13.8 oz (83.4 kg)    Ideal Body Weight:  78 kg  BMI:  Body mass index is 25.64 kg/m.  Estimated Nutritional Needs:   Kcal:  0315  Protein:  99-124  Fluid:  1.8 liters daily  EDUCATION NEEDS: none identified at this time    Colman Cater MS,RD,CSG,LDN Office: #[phone] Pager: [phone]

## 2017-04-14 NOTE — Progress Notes (Signed)
Inpatient Diabetes Program Recommendations  AACE/ADA: New Consensus Statement on Inpatient Glycemic Control (2015)  Target Ranges:  Prepandial:   less than 140 mg/dL      Peak postprandial:   less than 180 mg/dL (1-2 hours)      Critically ill patients:  140 - 180 mg/dL   Results for Andrew [name], Andrew [name] (MRN [id number]) as of 04/14/2017 11:12  Ref. Range 04/13/2017 08:10 04/13/2017 11:43 04/13/2017 17:31 04/13/2017 21:28 04/14/2017 07:45  Glucose-Capillary Latest Ref Range: 65 - 99 mg/dL 199 (H) 233 (H) 193 (H) 222 (H) 204 (H)   Review of Glycemic Control  Diabetes history: DM2 Outpatient Diabetes medications: Glipizide 5 mg QAM, Glumetza 1000 mg QAM, Onglyza 5 mg daily, Novolog sliding scale Current orders for Inpatient glycemic control: Novolog 0-9 units TID with meals, Novolog 0-5 units QHS   Inpatient Diabetes Program Recommendations: Insulin: Please consider discontinuing current Novolog correction and use ICU Glycemic Control order set with Novolog correction Q4H since patient is critically ill, on the ventilator, steroids, and will start tube feedings today.  Thanks, Barnie Alderman, RN, MSN, CDE Diabetes Coordinator Inpatient Diabetes Program [phone] (Team Pager from 8am to 5pm)

## 2017-04-14 NOTE — Progress Notes (Signed)
PROGRESS NOTE    Andrew [name]  AST:[id number] DOB: [birth year] DOA: 04/12/2017 PCP: System, Provider Not In     Brief Narrative:  Essential man admitted to the hospital on 8/8 due to shortness of breath and unresponsiveness presumed due to COPD with acute exacerbation. He required BiPAP upon admission and unfortunately he failed BiPAP and was intubated on the night of 8/9.  Assessment & Plan:   Principal Problem:   Acute respiratory failure with hypoxia and hypercapnia (HCC) Active Problems:   Diabetes mellitus (HCC)   Hyperlipidemia   TOBACCO ABUSE   Essential hypertension   ATRIAL FIBRILLATION   CHRONIC OBSTRUCTIVE PULMONARY DISEASE, ACUTE EXACERBATION   Acute hypoxic and hypercapnic respiratory failure -Patient has failed noninvasive therapy and has been intubated as of 8/9. -Appreciate Dr. Luan Pulling input and recommendations regarding ventilator management -Post intubation chest x-ray seems to show pneumonia; continue Rocephin and azithromycin -Chest x-ray today also shows a degree of pulmonary vascular congestion. 2-D echo with ejection fraction of 60-65% and grade 1 diastolic dysfunction. Received 40 mg of IV Lasix on 8/9. Do not see need for further Lasix at this time. -Post intubation ABG looks improved. -No weaning attempts to be performed today.   COPD with acute exacerbation -Continue steroids, nebs at current dose, less wheezing today. -See above for more details.  Tobacco abuse -Will need to discuss cessation once  extubated.  Atrial fibrillation -Rate controlled, not chronically anticoagulated, continue aspirin.  Type 2 diabetes -Uncontrolled, will start ICU hyperglycemia protocol with 5 units of basal Lantus.  Hypertension -Fair control, will add when necessary hydralazine. -Resume oral meds once more alert.  GERD -Resume omeprazole once more alert and off BiPAP.  Hyperlipidemia -Resume statin once more alert and off BiPAP.   DVT prophylaxis:  lovenox Code Status: full code Family Communication: patient only  Disposition Plan: To be determined pending medical stability  Consultants:   Pulmonary, Dr. Luan Pulling  Procedures:   None  Antimicrobials:  Anti-infectives    Start     Dose/Rate Route Frequency Ordered Stop   04/13/17 0745  cefTRIAXone (ROCEPHIN) 1 g in dextrose 5 % 50 mL IVPB     1 g 100 mL/hr over 30 Minutes Intravenous Every 24 hours 04/13/17 0743     04/12/17 1915  azithromycin (ZITHROMAX) 500 mg in dextrose 5 % 250 mL IVPB     500 mg 250 mL/hr over 60 Minutes Intravenous Daily-1800 04/12/17 1908 04/17/17 1759       Subjective: Intubated, sedated  Objective: Vitals:   04/14/17 1200 04/14/17 1300 04/14/17 1400 04/14/17 1420  BP:    103/75  Pulse: 73 76 72   Resp: '17 19 17   ' Temp:      TempSrc:      SpO2: 95% 94% 95% 95%  Weight:      Height:        Intake/Output Summary (Last 24 hours) at 04/14/17 1530 Last data filed at 04/14/17 1400  Gross per 24 hour  Intake          3057.27 ml  Output             1150 ml  Net          1907.27 ml   Filed Weights   04/12/17 1143 04/12/17 1830 04/14/17 0600  Weight: 78 kg (172 lb) 86.5 kg (190 lb 11.2 oz) 83.4 kg (183 lb 13.8 oz)    Examination:  General exam: Intubated, sedated Respiratory system: Good aeration, minimal expiratory wheezes  Cardiovascular system:RRR. No murmurs, rubs, gallops. Gastrointestinal system: Abdomen is nondistended, soft and nontender. No organomegaly or masses felt. Normal bowel sounds heard. Central nervous system: Unable to assess as patient is sedated Extremities: No C/C/E, +pedal pulses Skin: No rashes, lesions or ulcers Psychiatry: Unable to assess as patient is sedated      Data Reviewed: I have personally reviewed following labs and imaging studies  CBC:  Recent Labs Lab 04/12/17 1200 04/13/17 0605  WBC 9.2 11.6*  NEUTROABS 7.2  --   HGB 13.6 14.5  HCT 44.1 46.8  MCV 85.5 85.4  PLT 216 841   Basic  Metabolic Panel:  Recent Labs Lab 04/12/17 1200 04/13/17 0605 04/14/17 0446  NA 141 142  --   K 4.4 4.4  --   CL 97* 99*  --   CO2 36* 35*  --   GLUCOSE 162* 214*  --   BUN 16 22*  --   CREATININE 0.72 0.67  --   CALCIUM 8.9 9.0  --   MG  --   --  2.3  PHOS  --   --  1.9*   GFR: Estimated Creatinine Clearance: 81.1 mL/min (by C-G formula based on SCr of 0.67 mg/dL). Liver Function Tests:  Recent Labs Lab 04/12/17 1200  AST 28  ALT 40  ALKPHOS 87  BILITOT 0.7  PROT 6.9  ALBUMIN 3.1*   No results for input(s): LIPASE, AMYLASE in the last 168 hours. No results for input(s): AMMONIA in the last 168 hours. Coagulation Profile: No results for input(s): INR, PROTIME in the last 168 hours. Cardiac Enzymes:  Recent Labs Lab 04/12/17 1200  TROPONINI 0.03*   BNP (last 3 results) No results for input(s): PROBNP in the last 8760 hours. HbA1C:  Recent Labs  04/12/17 1918  HGBA1C 8.3*   CBG:  Recent Labs Lab 04/13/17 1143 04/13/17 1731 04/13/17 2128 04/14/17 0745 04/14/17 1120  GLUCAP 233* 193* 222* 204* 220*   Lipid Profile:  Recent Labs  04/14/17 0446  TRIG 173*   Thyroid Function Tests: No results for input(s): TSH, T4TOTAL, FREET4, T3FREE, THYROIDAB in the last 72 hours. Anemia Panel: No results for input(s): VITAMINB12, FOLATE, FERRITIN, TIBC, IRON, RETICCTPCT in the last 72 hours. Urine analysis:    Component Value Date/Time   COLORURINE AMBER (A) 05/05/2013 1621   APPEARANCEUR CLEAR 05/05/2013 1621   LABSPEC 1.025 05/05/2013 1621   PHURINE 5.5 05/05/2013 1621   GLUCOSEU NEGATIVE 05/05/2013 1621   HGBUR NEGATIVE 05/05/2013 1621   HGBUR negative 03/31/2009 1514   BILIRUBINUR SMALL (A) 05/05/2013 1621   KETONESUR NEGATIVE 05/05/2013 1621   PROTEINUR NEGATIVE 05/05/2013 1621   UROBILINOGEN 1.0 05/05/2013 1621   NITRITE NEGATIVE 05/05/2013 1621   LEUKOCYTESUR NEGATIVE 05/05/2013 1621   Sepsis  Labs: '@LABRCNTIP' (procalcitonin:4,lacticidven:4)  ) Recent Results (from the past 240 hour(s))  MRSA PCR Screening     Status: None   Collection Time: 04/12/17  3:06 PM  Result Value Ref Range Status   MRSA by PCR NEGATIVE NEGATIVE Final    Comment:        The GeneXpert MRSA Assay (FDA approved for NASAL specimens only), is one component of a comprehensive MRSA colonization surveillance program. It is not intended to diagnose MRSA infection nor to guide or monitor treatment for MRSA infections.          Radiology Studies: Dg Chest Port 1 View  Result Date: 04/14/2017 CLINICAL DATA:  Respiratory failure EXAM: PORTABLE CHEST 1 VIEW COMPARISON:  Chest  radiograph from one day prior. FINDINGS: Endotracheal tube tip is 3.3 cm above the carina. Enteric tube enters stomach with the tip not seen on this image. Stable cardiomediastinal silhouette with mild cardiomegaly. No pneumothorax. No pleural effusion. No overt pulmonary edema. Stable patchy consolidation and volume loss at the left lung base. IMPRESSION: 1. Well-positioned support structures.  No pneumothorax. 2. Stable patchy left lung base consolidation and volume loss, which could represent pneumonia, aspiration and/or atelectasis. Electronically Signed   By: Ilona Sorrel M.D.   On: 04/14/2017 10:49   Dg Chest Port 1 View  Result Date: 04/13/2017 CLINICAL DATA:  Respiratory failure and status post intubation. EXAM: PORTABLE CHEST 1 VIEW COMPARISON:  Film earlier today at 0600 hours FINDINGS: Endotracheal tube present with the tip approximately 6.5 cm above the carina. Gastric decompression tube extends below the diaphragm. There is substantial worsening of left lung airspace disease with reduced aeration of the left lung. Findings are likely representative of worsening pneumonia. There likely is an associated left pleural effusion. The right lung shows bronchovascular prominence and probable edema. IMPRESSION: Endotracheal tube tip  approximately 6.5 cm above the carina. Gastric decompression tube extends below the diaphragm. Significant worsening left lung airspace disease likely representative of worsening pneumonia. There is likely an associated left pleural effusion. Probable component of pulmonary edema. Electronically Signed   By: Aletta Edouard M.D.   On: 04/13/2017 22:22   Dg Chest Port 1 View  Result Date: 04/13/2017 CLINICAL DATA:  COPD EXAM: PORTABLE CHEST 1 VIEW COMPARISON:  April 12, 2017 FINDINGS: There is no appreciable edema or consolidation. There is a small left pleural effusion with slight right base atelectasis. There is cardiomegaly with pulmonary venous hypertension. No adenopathy. No bone lesions. IMPRESSION: Persistent pulmonary vascular congestion. Small left pleural effusion. Mild right base atelectasis. No frank edema or consolidation. Electronically Signed   By: Lowella Grip III M.D.   On: 04/13/2017 07:59        Scheduled Meds: . aspirin  300 mg Rectal Daily  . chlorhexidine gluconate (MEDLINE KIT)  15 mL Mouth Rinse BID  . enoxaparin (LOVENOX) injection  40 mg Subcutaneous Q24H  . feeding supplement (PRO-STAT SUGAR FREE 64)  30 mL Per Tube BID  . feeding supplement (VITAL HIGH PROTEIN)  1,000 mL Per Tube Q24H  . insulin aspart  2-6 Units Subcutaneous Q4H  . insulin glargine  5 Units Subcutaneous QHS  . ipratropium  0.5 mg Nebulization Q6H  . mouth rinse  15 mL Mouth Rinse QID  . methylPREDNISolone (SOLU-MEDROL) injection  80 mg Intravenous Q8H  . nicotine  14 mg Transdermal Daily  . pantoprazole (PROTONIX) IV  40 mg Intravenous Q24H   Continuous Infusions: . sodium chloride 75 mL/hr at 04/13/17 1126  . azithromycin Stopped (04/13/17 1926)  . cefTRIAXone (ROCEPHIN)  IV Stopped (04/14/17 0847)  . propofol (DIPRIVAN) infusion 40 mcg/kg/min (04/14/17 1230)     LOS: 2 days    Critical time spent: 45 minutes. Greater than 50% of this time was spent in direct contact with the  patient coordinating care.     Lelon Frohlich, MD [facility] Pager [phone]  If 7PM-7AM, please contact night-coverage [email or web address] Password [email or web address] 04/14/2017, 3:30 PM

## 2017-04-14 NOTE — Progress Notes (Signed)
Subjective: He was intubated last night. I discussed potential for intubation with Dr. Jerilee Hoh yesterday but we deferred at that time because his blood gas looked slightly better and because of his family's concern about acceleration of care and having been told that if he was intubated he might never come off. Clearly the right decision was made last night to intubate him. He remains intubated and on mechanical ventilation  Objective: Vital signs in last 24 hours: Temp:  [98.1 F (36.7 C)-98.9 F (37.2 C)] 98.2 F (36.8 C) (08/10 0400) Pulse Rate:  [64-103] 81 (08/10 0600) Resp:  [14-36] 16 (08/10 0600) BP: (93-207)/(59-109) 103/63 (08/10 0600) SpO2:  [89 %-99 %] 95 % (08/10 0600) FiO2 (%):  [30 %-50 %] 50 % (08/10 0600) Weight:  [83.4 kg (183 lb 13.8 oz)] 83.4 kg (183 lb 13.8 oz) (08/10 0600) Weight change: 5.381 kg (11 lb 13.8 oz) Last BM Date: 04/13/17  Intake/Output from previous day: 08/09 0701 - 08/10 0700 In: 2422.7 [I.V.:1872.7; IV Piggyback:550] Out: 3000 [Urine:2800; Emesis/NG output:200]  PHYSICAL EXAM General appearance: Intubated sedated on mechanical ventilation Resp: Rhonchi and wheezing bilaterally left more than right Cardio: regular rate and rhythm, S1, S2 normal, no murmur, click, rub or gallop GI: soft, non-tender; bowel sounds normal; no masses,  no organomegaly Extremities: extremities normal, atraumatic, no cyanosis or edema Mucous membranes mildly dry  Lab Results:  Results for orders placed or performed during the hospital encounter of 04/12/17 (from the past 48 hour(s))  CBG monitoring, ED     Status: Abnormal   Collection Time: 04/12/17 11:52 AM  Result Value Ref Range   Glucose-Capillary 153 (H) 65 - 99 mg/dL  Blood gas, arterial ([facility] & [facility] ONLY)     Status: Abnormal   Collection Time: 04/12/17 11:55 AM  Result Value Ref Range   O2 Content 5.0 L/min   Delivery systems NASAL CANNULA    pH, Arterial 7.268 (L) 7.350 - 7.450   pCO2 arterial 84.3  (HH) 32.0 - 48.0 mmHg    Comment: CRITICAL RESULT CALLED TO, READ BACK BY AND VERIFIED WITH:  Cornell Barman AT [place] BY JENNIFER ROWLAND,RRT ON 04/12/2017.    pO2, Arterial 144 (H) 83.0 - 108.0 mmHg   Bicarbonate 31.1 (H) 20.0 - 28.0 mmol/L   Acid-Base Excess 10.2 (H) 0.0 - 2.0 mmol/L   O2 Saturation 97.9 %   Patient temperature 37.0    Collection site LEFT RADIAL    Drawn by [phone]    Sample type ARTERIAL DRAW    Allens test (pass/fail) PASS PASS  CBC with Differential/Platelet     Status: Abnormal   Collection Time: 04/12/17 12:00 PM  Result Value Ref Range   WBC 9.2 4.0 - 10.5 K/uL   RBC 5.16 4.22 - 5.81 MIL/uL   Hemoglobin 13.6 13.0 - 17.0 g/dL   HCT 44.1 39.0 - 52.0 %   MCV 85.5 78.0 - 100.0 fL   MCH 26.4 26.0 - 34.0 pg   MCHC 30.8 30.0 - 36.0 g/dL   RDW 18.4 (H) 11.5 - 15.5 %   Platelets 216 150 - 400 K/uL   Neutrophils Relative % 78 %   Lymphocytes Relative 11 %   Monocytes Relative 10 %   Eosinophils Relative 0 %   Basophils Relative 1 %   Neutro Abs 7.2 1.7 - 7.7 K/uL   Lymphs Abs 1.0 0.7 - 4.0 K/uL   Monocytes Absolute 0.9 0.1 - 1.0 K/uL   Eosinophils Absolute 0.0 0.0 - 0.7 K/uL  Basophils Absolute 0.1 0.0 - 0.1 K/uL   WBC Morphology WHITE COUNT CONFIRMED ON SMEAR   Comprehensive metabolic panel     Status: Abnormal   Collection Time: 04/12/17 12:00 PM  Result Value Ref Range   Sodium 141 135 - 145 mmol/L   Potassium 4.4 3.5 - 5.1 mmol/L   Chloride 97 (L) 101 - 111 mmol/L   CO2 36 (H) 22 - 32 mmol/L   Glucose, Bld 162 (H) 65 - 99 mg/dL   BUN 16 6 - 20 mg/dL   Creatinine, Ser 0.72 0.61 - 1.24 mg/dL   Calcium 8.9 8.9 - 10.3 mg/dL   Total Protein 6.9 6.5 - 8.1 g/dL   Albumin 3.1 (L) 3.5 - 5.0 g/dL   AST 28 15 - 41 U/L   ALT 40 17 - 63 U/L   Alkaline Phosphatase 87 38 - 126 U/L   Total Bilirubin 0.7 0.3 - 1.2 mg/dL   GFR calc non Af Amer >60 >60 mL/min   GFR calc Af Amer >60 >60 mL/min    Comment: (NOTE) The eGFR has been calculated using the CKD EPI  equation. This calculation has not been validated in all clinical situations. eGFR's persistently <60 mL/min signify possible Chronic Kidney Disease.    Anion gap 8 5 - 15  Brain natriuretic peptide     Status: Abnormal   Collection Time: 04/12/17 12:00 PM  Result Value Ref Range   B Natriuretic Peptide 317.0 (H) 0.0 - 100.0 pg/mL  Troponin I     Status: Abnormal   Collection Time: 04/12/17 12:00 PM  Result Value Ref Range   Troponin I 0.03 (HH) <0.03 ng/mL    Comment: CRITICAL RESULT CALLED TO, READ BACK BY AND VERIFIED WITH: ROBERTSON,L AT 1316 ON 8.8.2018 BY ISLEY,B   I-Stat CG4 Lactic Acid, ED     Status: None   Collection Time: 04/12/17 12:16 PM  Result Value Ref Range   Lactic Acid, Venous 0.69 0.5 - 1.9 mmol/L  Blood gas, arterial ([facility] ONLY)     Status: Abnormal   Collection Time: 04/12/17  2:06 PM  Result Value Ref Range   FIO2 0.40    Delivery systems BILEVEL POSITIVE AIRWAY PRESSURE    Mode BILEVEL POSITIVE AIRWAY PRESSURE    LHR 12.0 resp/min   Inspiratory PAP 18    Expiratory PAP 6    pH, Arterial 7.312 (L) 7.350 - 7.450   pCO2 arterial 74.3 (HH) 32.0 - 48.0 mmHg    Comment: CRITICAL RESULT CALLED TO, READ BACK BY AND VERIFIED WITH: LAUREN ROBINSON,RN AT 1418, BY WENDY VIA,RRT,RCP ON 04/12/2017    pO2, Arterial 121 (H) 83.0 - 108.0 mmHg   Bicarbonate 31.4 (H) 20.0 - 28.0 mmol/L   Acid-Base Excess 10.1 (H) 0.0 - 2.0 mmol/L   O2 Saturation 97.5 %   Patient temperature 37.0    Collection site LEFT RADIAL    Drawn by [phone]    Sample type ARTERIAL DRAW    Allens test (pass/fail) PASS PASS  MRSA PCR Screening     Status: None   Collection Time: 04/12/17  3:06 PM  Result Value Ref Range   MRSA by PCR NEGATIVE NEGATIVE    Comment:        The GeneXpert MRSA Assay (FDA approved for NASAL specimens only), is one component of a comprehensive MRSA colonization surveillance program. It is not intended to diagnose MRSA infection nor to guide or monitor  treatment for MRSA infections.   Glucose,  capillary     Status: Abnormal   Collection Time: 04/12/17  5:38 PM  Result Value Ref Range   Glucose-Capillary 235 (H) 65 - 99 mg/dL  Hemoglobin I9I     Status: Abnormal   Collection Time: 04/12/17  7:18 PM  Result Value Ref Range   Hgb A1c MFr Bld 8.3 (H) 4.8 - 5.6 %    Comment: (NOTE) Pre diabetes:          5.7%-6.4% Diabetes:              >6.4% Glycemic control for   <7.0% adults with diabetes    Mean Plasma Glucose 191.51 mg/dL    Comment: Performed at [facility], [address], [place], [place] [address]  Glucose, capillary     Status: Abnormal   Collection Time: 04/12/17  8:04 PM  Result Value Ref Range   Glucose-Capillary 246 (H) 65 - 99 mg/dL   Comment 1 Notify RN   Blood gas, arterial     Status: Abnormal   Collection Time: 04/13/17  4:26 AM  Result Value Ref Range   FIO2 40.00    Delivery systems BILEVEL POSITIVE AIRWAY PRESSURE    Inspiratory PAP 12    Expiratory PAP 6.0    pH, Arterial 7.334 (L) 7.350 - 7.450   pCO2 arterial 70.1 (HH) 32.0 - 48.0 mmHg    Comment: CRITICAL RESULT CALLED TO, READ BACK BY AND VERIFIED WITH: TIFFANI ROBERTS, RN 04/13/2017 @ 0505 KRWYATT, RRT    pO2, Arterial 76.2 (L) 83.0 - 108.0 mmHg   Bicarbonate 31.7 (H) 20.0 - 28.0 mmol/L   Acid-Base Excess 10.3 (H) 0.0 - 2.0 mmol/L   O2 Saturation 93.4 %   Patient temperature 37.0    Collection site LEFT RADIAL    Sample type ARTERIAL DRAW    Allens test (pass/fail) PASS PASS  Basic metabolic panel     Status: Abnormal   Collection Time: 04/13/17  6:05 AM  Result Value Ref Range   Sodium 142 135 - 145 mmol/L   Potassium 4.4 3.5 - 5.1 mmol/L   Chloride 99 (L) 101 - 111 mmol/L   CO2 35 (H) 22 - 32 mmol/L   Glucose, Bld 214 (H) 65 - 99 mg/dL   BUN 22 (H) 6 - 20 mg/dL   Creatinine, Ser 4.20 0.61 - 1.24 mg/dL   Calcium 9.0 8.9 - 91.1 mg/dL   GFR calc non Af Amer >60 >60 mL/min   GFR calc Af Amer >60 >60 mL/min    Comment: (NOTE) The  eGFR has been calculated using the CKD EPI equation. This calculation has not been validated in all clinical situations. eGFR's persistently <60 mL/min signify possible Chronic Kidney Disease.    Anion gap 8 5 - 15  CBC     Status: Abnormal   Collection Time: 04/13/17  6:05 AM  Result Value Ref Range   WBC 11.6 (H) 4.0 - 10.5 K/uL   RBC 5.48 4.22 - 5.81 MIL/uL   Hemoglobin 14.5 13.0 - 17.0 g/dL   HCT 53.5 14.8 - 25.9 %   MCV 85.4 78.0 - 100.0 fL   MCH 26.5 26.0 - 34.0 pg   MCHC 31.0 30.0 - 36.0 g/dL   RDW 83.7 (H) 88.9 - 96.4 %   Platelets 219 150 - 400 K/uL  Glucose, capillary     Status: Abnormal   Collection Time: 04/13/17  8:10 AM  Result Value Ref Range   Glucose-Capillary 199 (H) 65 -  99 mg/dL  Glucose, capillary     Status: Abnormal   Collection Time: 04/13/17 11:43 AM  Result Value Ref Range   Glucose-Capillary 233 (H) 65 - 99 mg/dL  Blood gas, arterial     Status: Abnormal   Collection Time: 04/13/17  3:15 PM  Result Value Ref Range   FIO2 40.00    Delivery systems BILEVEL POSITIVE AIRWAY PRESSURE    Mode BILEVEL POSITIVE AIRWAY PRESSURE    Inspiratory PAP 14    Expiratory PAP 6    pH, Arterial 7.355 7.350 - 7.450   pCO2 arterial 72.0 (HH) 32.0 - 48.0 mmHg    Comment: CRITICAL RESULT CALLED TO, READ BACK BY AND VERIFIED WITH: HOWARD,C.RN AT 1534 04/13/17 BY BROADNAX,L.RRT    pO2, Arterial 114.00 (H) 83.0 - 108.0 mmHg   Bicarbonate 34.5 (H) 20.0 - 28.0 mmol/L   Acid-Base Excess 13.2 (H) 0.0 - 2.0 mmol/L   O2 Saturation 97.3 %   Collection site LEFT RADIAL    Drawn by [id number]    Sample type ARTERIAL DRAW    Allens test (pass/fail) PASS PASS  Glucose, capillary     Status: Abnormal   Collection Time: 04/13/17  5:31 PM  Result Value Ref Range   Glucose-Capillary 193 (H) 65 - 99 mg/dL  Glucose, capillary     Status: Abnormal   Collection Time: 04/13/17  9:28 PM  Result Value Ref Range   Glucose-Capillary 222 (H) 65 - 99 mg/dL   Comment 1 Notify RN   Blood  gas, arterial     Status: Abnormal   Collection Time: 04/13/17 11:24 PM  Result Value Ref Range   FIO2 50.00    Delivery systems VENTILATOR    Mode PRESSURE REGULATED VOLUME CONTROL    VT 500 mL   Peep/cpap 5.0 cm H20   pH, Arterial 7.416 7.350 - 7.450   pCO2 arterial 58.0 (H) 32.0 - 48.0 mmHg   pO2, Arterial 103 83.0 - 108.0 mmHg   Bicarbonate 33.9 (H) 20.0 - 28.0 mmol/L   Acid-Base Excess 11.5 (H) 0.0 - 2.0 mmol/L   O2 Saturation 97.1 %   Patient temperature 37.0    Collection site LEFT RADIAL    Drawn by [phone]    Sample type ARTERIAL DRAW    Allens test (pass/fail) PASS PASS   Mechanical Rate 16   Triglycerides     Status: Abnormal   Collection Time: 04/14/17  4:46 AM  Result Value Ref Range   Triglycerides 173 (H) <150 mg/dL  Blood gas, arterial     Status: Abnormal   Collection Time: 04/14/17  5:03 AM  Result Value Ref Range   FIO2 50.00    Delivery systems VENTILATOR    Mode PRESSURE REGULATED VOLUME CONTROL    VT 500 mL   Peep/cpap 5.0 cm H20   pH, Arterial 7.426 7.350 - 7.450   pCO2 arterial 57.7 (H) 32.0 - 48.0 mmHg   pO2, Arterial 122 (H) 83.0 - 108.0 mmHg   Bicarbonate 34.7 (H) 20.0 - 28.0 mmol/L   Acid-Base Excess 12.3 (H) 0.0 - 2.0 mmol/L   O2 Saturation 97.8 %   Patient temperature 37.0    Collection site LEFT RADIAL    Drawn by [phone]    Sample type ARTERIAL DRAW    Allens test (pass/fail) PASS PASS   Mechanical Rate 16     ABGS  Recent Labs  04/14/17 0503  PHART 7.426  PO2ART 122*  HCO3 34.7*   CULTURES Recent Results (  from the past 240 hour(s))  MRSA PCR Screening     Status: None   Collection Time: 04/12/17  3:06 PM  Result Value Ref Range Status   MRSA by PCR NEGATIVE NEGATIVE Final    Comment:        The GeneXpert MRSA Assay (FDA approved for NASAL specimens only), is one component of a comprehensive MRSA colonization surveillance program. It is not intended to diagnose MRSA infection nor to guide or monitor treatment for MRSA  infections.    Studies/Results: Dg Chest Port 1 View  Result Date: 04/13/2017 CLINICAL DATA:  Respiratory failure and status post intubation. EXAM: PORTABLE CHEST 1 VIEW COMPARISON:  Film earlier today at 0600 hours FINDINGS: Endotracheal tube present with the tip approximately 6.5 cm above the carina. Gastric decompression tube extends below the diaphragm. There is substantial worsening of left lung airspace disease with reduced aeration of the left lung. Findings are likely representative of worsening pneumonia. There likely is an associated left pleural effusion. The right lung shows bronchovascular prominence and probable edema. IMPRESSION: Endotracheal tube tip approximately 6.5 cm above the carina. Gastric decompression tube extends below the diaphragm. Significant worsening left lung airspace disease likely representative of worsening pneumonia. There is likely an associated left pleural effusion. Probable component of pulmonary edema. Electronically Signed   By: Aletta Edouard M.D.   On: 04/13/2017 22:22   Dg Chest Port 1 View  Result Date: 04/13/2017 CLINICAL DATA:  COPD EXAM: PORTABLE CHEST 1 VIEW COMPARISON:  April 12, 2017 FINDINGS: There is no appreciable edema or consolidation. There is a small left pleural effusion with slight right base atelectasis. There is cardiomegaly with pulmonary venous hypertension. No adenopathy. No bone lesions. IMPRESSION: Persistent pulmonary vascular congestion. Small left pleural effusion. Mild right base atelectasis. No frank edema or consolidation. Electronically Signed   By: Lowella Grip III M.D.   On: 04/13/2017 07:59   Dg Chest Portable 1 View  Result Date: 04/12/2017 CLINICAL DATA:  Hypoxia EXAM: PORTABLE CHEST 1 VIEW COMPARISON:  April 20, 2010 FINDINGS: There is atelectatic change in the left base. There is no frank edema or consolidation. There is cardiomegaly with mild pulmonary venous hypertension. No adenopathy. No bone lesions. IMPRESSION:  Pulmonary vascular congestion without frank edema or consolidation. Left base atelectasis. Electronically Signed   By: Lowella Grip III M.D.   On: 04/12/2017 12:14    Medications:  Prior to Admission:  Prescriptions Prior to Admission  Medication Sig Dispense Refill Last Dose  . albuterol (PROVENTIL HFA;VENTOLIN HFA) 108 (90 BASE) MCG/ACT inhaler Inhale 2 puffs into the lungs every 4 (four) hours as needed for wheezing.    Taking  . albuterol (PROVENTIL) (2.5 MG/3ML) 0.083% nebulizer solution Take 2.5 mg by nebulization every 4 (four) hours as needed for wheezing or shortness of breath.   Taking  . aspirin 325 MG tablet Take 325 mg by mouth daily.   04/11/2017 at Unknown time  . Cyanocobalamin (VITAMIN B-12) 2500 MCG SUBL Place 2,500 mcg under the tongue daily.   04/11/2017 at Unknown time  . cyclobenzaprine (FLEXERIL) 10 MG tablet Take 5 mg by mouth at bedtime.   04/11/2017 at Unknown time  . diltiazem (TIAZAC) 300 MG 24 hr capsule Take 300 mg by mouth daily.    04/11/2017 at Unknown time  . diphenhydrAMINE (BENADRYL) 25 mg capsule Take 25 mg by mouth at bedtime as needed.   04/11/2017 at Unknown time  . furosemide (LASIX) 20 MG tablet Take 40 mg by  mouth daily.    04/11/2017 at Unknown time  . gabapentin (NEURONTIN) 400 MG capsule Take 400 mg by mouth 4 (four) times daily.   04/11/2017 at Unknown time  . glipiZIDE (GLUCOTROL) 5 MG tablet Take 5 mg by mouth daily before breakfast.   04/11/2017 at Unknown time  . ipratropium-albuterol (DUONEB) 0.5-2.5 (3) MG/3ML SOLN Take 3 mLs by nebulization QID.    04/11/2017 at Unknown time  . metoCLOPramide (REGLAN) 10 MG tablet Take 10 mg by mouth 4 (four) times daily.   04/11/2017 at Unknown time  . montelukast (SINGULAIR) 10 MG tablet Take 10 mg by mouth daily.   04/11/2017 at Unknown time  . omeprazole (PRILOSEC) 40 MG capsule Take 40 mg by mouth daily.   04/11/2017 at Unknown time  . rosuvastatin (CRESTOR) 20 MG tablet Take 1 tablet (20 mg total) by mouth daily. 30  tablet 3 04/11/2017 at Unknown time  . saxagliptin HCl (ONGLYZA) 5 MG TABS tablet Take 5 mg by mouth daily.   04/11/2017 at Unknown time  . tiZANidine (ZANAFLEX) 4 MG tablet Take 4 mg by mouth 3 (three) times daily.   04/11/2017 at Unknown time  . umeclidinium-vilanterol (ANORO ELLIPTA) 62.5-25 MCG/INH AEPB Inhale 1 puff into the lungs daily.   04/11/2017 at Unknown time  . cefdinir (OMNICEF) 300 MG capsule Take 300 mg by mouth 2 (two) times daily.   Completed Course at Unknown time  . insulin aspart (NOVOLOG) 100 UNIT/ML injection Per sliding scale   Taking  . metFORMIN (GLUMETZA) 1000 MG (MOD) 24 hr tablet Take 1,000 mg by mouth daily with breakfast.   Not Taking at Unknown time   Scheduled: . aspirin  300 mg Rectal Daily  . chlorhexidine gluconate (MEDLINE KIT)  15 mL Mouth Rinse BID  . enoxaparin (LOVENOX) injection  40 mg Subcutaneous Q24H  . insulin aspart  0-5 Units Subcutaneous QHS  . insulin aspart  0-9 Units Subcutaneous TID WC  . ipratropium  0.5 mg Nebulization Q6H  . mouth rinse  15 mL Mouth Rinse QID  . methylPREDNISolone (SOLU-MEDROL) injection  80 mg Intravenous Q8H  . nicotine  14 mg Transdermal Daily  . pantoprazole (PROTONIX) IV  40 mg Intravenous Q24H   Continuous: . sodium chloride 75 mL/hr at 04/13/17 1126  . azithromycin Stopped (04/13/17 1926)  . cefTRIAXone (ROCEPHIN)  IV Stopped (04/13/17 0848)  . propofol (DIPRIVAN) infusion 40 mcg/kg/min (04/14/17 7741)   SEL:[id number] **OR** acetaminophen, albuterol, hydrALAZINE, ondansetron **OR** ondansetron (ZOFRAN) IV, senna-docusate  Assesment: He was admitted with acute hypoxic and hypercapnic respiratory failure. He failed BiPAP and is now intubated and on the ventilator. Chest x-ray done after intubation that I personally reviewed shows that the infiltrate seen on the prior x-ray is worse. He looks substantially more comfortable Principal Problem:   Acute respiratory failure with hypoxia and hypercapnia (HCC) Active  Problems:   Diabetes mellitus (HCC)   Hyperlipidemia   TOBACCO ABUSE   Essential hypertension   ATRIAL FIBRILLATION   CHRONIC OBSTRUCTIVE PULMONARY DISEASE, ACUTE EXACERBATION    Plan: Check chest x-ray today. Arterial blood gas today is much improved. Start tube feedings. I don't think he's in a position that we can wean him yet    LOS: 2 days   Nain Rudd L 04/14/2017, 7:43 AM

## 2017-04-15 ENCOUNTER — Inpatient Hospital Stay (HOSPITAL_COMMUNITY): Payer: Medicare PPO

## 2017-04-15 LAB — CBC
HEMATOCRIT: 40.5 % (ref 39.0–52.0)
HEMOGLOBIN: 12.8 g/dL — AB (ref 13.0–17.0)
MCH: 26.2 pg (ref 26.0–34.0)
MCHC: 31.6 g/dL (ref 30.0–36.0)
MCV: 83 fL (ref 78.0–100.0)
Platelets: 225 10*3/uL (ref 150–400)
RBC: 4.88 MIL/uL (ref 4.22–5.81)
RDW: 17.8 % — AB (ref 11.5–15.5)
WBC: 9.4 10*3/uL (ref 4.0–10.5)

## 2017-04-15 LAB — BASIC METABOLIC PANEL
ANION GAP: 7 (ref 5–15)
BUN: 36 mg/dL — AB (ref 6–20)
CO2: 37 mmol/L — AB (ref 22–32)
Calcium: 8.7 mg/dL — ABNORMAL LOW (ref 8.9–10.3)
Chloride: 99 mmol/L — ABNORMAL LOW (ref 101–111)
Creatinine, Ser: 0.76 mg/dL (ref 0.61–1.24)
GFR calc Af Amer: >60 mL/min (ref >60)
GFR calc non Af Amer: >60 mL/min (ref >60)
GLUCOSE: 173 mg/dL — AB (ref 65–99)
POTASSIUM: 3.6 mmol/L (ref 3.5–5.1)
Sodium: 143 mmol/L (ref 135–145)

## 2017-04-15 LAB — BLOOD GAS, ARTERIAL
ACID-BASE EXCESS: 13.6 mmol/L — AB (ref 0.0–2.0)
BICARBONATE: 36.2 mmol/L — AB (ref 20.0–28.0)
Drawn by: 28459
FIO2: 40
LHR: 16 {breaths}/min
O2 Saturation: 96.7 %
PATIENT TEMPERATURE: 37
PCO2 ART: 55.2 mmHg — AB (ref 32.0–48.0)
PEEP/CPAP: 5 cmH2O
VT: 500 mL
pH, Arterial: 7.457 — ABNORMAL HIGH (ref 7.350–7.450)
pO2, Arterial: 99.3 mmHg (ref 83.0–108.0)

## 2017-04-15 LAB — PHOSPHORUS
PHOSPHORUS: 3.4 mg/dL (ref 2.5–4.6)
Phosphorus: 2.9 mg/dL (ref 2.5–4.6)

## 2017-04-15 LAB — GLUCOSE, CAPILLARY
GLUCOSE-CAPILLARY: 151 mg/dL — AB (ref 65–99)
Glucose-Capillary: 124 mg/dL — ABNORMAL HIGH (ref 65–99)
Glucose-Capillary: 154 mg/dL — ABNORMAL HIGH (ref 65–99)
Glucose-Capillary: 171 mg/dL — ABNORMAL HIGH (ref 65–99)
Glucose-Capillary: 187 mg/dL — ABNORMAL HIGH (ref 65–99)
Glucose-Capillary: 213 mg/dL — ABNORMAL HIGH (ref 65–99)
Glucose-Capillary: 218 mg/dL — ABNORMAL HIGH (ref 65–99)

## 2017-04-15 LAB — MAGNESIUM
MAGNESIUM: 2.2 mg/dL (ref 1.7–2.4)
Magnesium: 2.2 mg/dL (ref 1.7–2.4)

## 2017-04-15 MED ORDER — IPRATROPIUM-ALBUTEROL 0.5-2.5 (3) MG/3ML IN SOLN
3.0000 mL | Freq: Four times a day (QID) | RESPIRATORY_TRACT | Status: DC
  Administered 2017-04-15 – 2017-04-22 (×26): 3 mL via RESPIRATORY_TRACT
  Filled 2017-04-15 (×26): qty 3

## 2017-04-15 NOTE — Progress Notes (Signed)
Pt placed back on full support due to increased WOB, increased RR. Pt tolerating at this time, RT will continue to monitor.

## 2017-04-15 NOTE — Progress Notes (Addendum)
PROGRESS NOTE    Andrew [name]  YIR:[id number] DOB: [birth year] DOA: 04/12/2017 PCP: System, Provider Not In     Brief Narrative:  76 y/o man admitted to the hospital on 8/8 due to shortness of breath and unresponsiveness presumed due to COPD with acute exacerbation. He required BiPAP upon admission and unfortunately he failed BiPAP and was intubated on the night of 8/9.  Assessment & Plan:   Principal Problem:   Acute respiratory failure with hypoxia and hypercapnia (HCC) Active Problems:   Diabetes mellitus (HCC)   Hyperlipidemia   TOBACCO ABUSE   Essential hypertension   ATRIAL FIBRILLATION   CHRONIC OBSTRUCTIVE PULMONARY DISEASE, ACUTE EXACERBATION   Acute hypoxic and hypercapnic respiratory failure -Patient failed noninvasive therapy and was intubated on 8/9. -Appreciate Dr. Luan Pulling input and recommendations regarding ventilator management -Post intubation chest x-ray seems to show pneumonia (CAP); continue Rocephin and azithromycin -Chest x-ray today also shows a degree of pulmonary vascular congestion. 2-D echo with ejection fraction of 60-65% and grade 1 diastolic dysfunction. Received 40 mg of IV Lasix on 8/9. Do not see need for further Lasix at this time. -Post intubation ABG looks improved. -He is currently weaning on pressure support.   COPD with acute exacerbation -Continue steroids, nebs at current dose, less wheezing today. -See above for more details.  Tobacco abuse -Will need to discuss cessation once  extubated.  Atrial fibrillation -Rate controlled, not chronically anticoagulated, continue aspirin.  Type 2 diabetes -On start ICU hyperglycemia protocol with 5 units of basal Lantus. -Improved glycemic control.  Hypertension -Fair control, will add when necessary hydralazine. -Resume oral meds once more alert.  GERD -Continue PPI.  Hyperlipidemia -Resume statin once more alert and off BiPAP.   DVT prophylaxis: lovenox Code Status: full  code Family Communication: patient only  Disposition Plan: To be determined pending medical stability  Consultants:   Pulmonary, Dr. Luan Pulling  Procedures:   ECHO as above  Antimicrobials:  Anti-infectives    Start     Dose/Rate Route Frequency Ordered Stop   04/13/17 0745  cefTRIAXone (ROCEPHIN) 1 g in dextrose 5 % 50 mL IVPB     1 g 100 mL/hr over 30 Minutes Intravenous Every 24 hours 04/13/17 0743     04/12/17 1915  azithromycin (ZITHROMAX) 500 mg in dextrose 5 % 250 mL IVPB     500 mg 250 mL/hr over 60 Minutes Intravenous Daily-1800 04/12/17 1908 04/17/17 1759       Subjective: Intubated, sedated  Objective: Vitals:   04/15/17 0800 04/15/17 0815 04/15/17 0857 04/15/17 0901  BP: (!) 169/83 (!) 153/84  112/61  Pulse: (!) 58 (!) 53  63  Resp: _0 Temp: (!) 97.2 F (36.2 C) (!) 97.3 F (36.3 C)    TempSrc:      SpO2: 97% 96% 92% 93%  Weight:      Height:        Intake/Output Summary (Last 24 hours) at 04/15/17 0933 Last data filed at 04/15/17 0700  Gross per 24 hour  Intake          4356.27 ml  Output              500 ml  Net          3856.27 ml   Filed Weights   04/12/17 1830 04/14/17 0600 04/15/17 0600  Weight: 86.5 kg (190 lb 11.2 oz) 83.4 kg (183 lb 13.8 oz) 86.8 kg (191 lb 5.8 oz)    Examination:  General exam: intubated, sedated Respiratory system: No wheezing, ronchorus breath sounds Cardiovascular system:RRR. No murmurs, rubs, gallops. Gastrointestinal system: Abdomen is nondistended, soft and nontender. No organomegaly or masses felt. Normal bowel sounds heard. Central nervous system: sedated Extremities: No C/C/E, +pedal pulses Skin: No rashes, lesions or ulcers Psychiatry:Unable to assess given current mental state       Data Reviewed: I have personally reviewed following labs and imaging studies  CBC:  Recent Labs Lab 04/12/17 1200 04/13/17 0605 04/15/17 0445  WBC 9.2 11.6* 9.4  NEUTROABS 7.2  --   --   HGB 13.6 14.5  12.8*  HCT 44.1 46.8 40.5  MCV 85.5 85.4 83.0  PLT 216 219 542   Basic Metabolic Panel:  Recent Labs Lab 04/12/17 1200 04/13/17 0605 04/14/17 0446 04/14/17 1642 04/15/17 0445  NA 141 142  --   --  143  K 4.4 4.4  --   --  3.6  CL 97* 99*  --   --  99*  CO2 36* 35*  --   --  37*  GLUCOSE 162* 214*  --   --  173*  BUN 16 22*  --   --  36*  CREATININE 0.72 0.67  --   --  0.76  CALCIUM 8.9 9.0  --   --  8.7*  MG  --   --  2.3 2.3 2.2  PHOS  --   --  1.9* 2.7 2.9   GFR: Estimated Creatinine Clearance: 81.1 mL/min (by C-G formula based on SCr of 0.76 mg/dL). Liver Function Tests:  Recent Labs Lab 04/12/17 1200  AST 28  ALT 40  ALKPHOS 87  BILITOT 0.7  PROT 6.9  ALBUMIN 3.1*   No results for input(s): LIPASE, AMYLASE in the last 168 hours. No results for input(s): AMMONIA in the last 168 hours. Coagulation Profile: No results for input(s): INR, PROTIME in the last 168 hours. Cardiac Enzymes:  Recent Labs Lab 04/12/17 1200  TROPONINI 0.03*   BNP (last 3 results) No results for input(s): PROBNP in the last 8760 hours. HbA1C:  Recent Labs  04/12/17 1918  HGBA1C 8.3*   CBG:  Recent Labs Lab 04/14/17 2057 04/14/17 2207 04/15/17 0003 04/15/17 0400 04/15/17 0752  GLUCAP 285* 225* 187* 171* 154*   Lipid Profile:  Recent Labs  04/14/17 0446  TRIG 173*   Thyroid Function Tests: No results for input(s): TSH, T4TOTAL, FREET4, T3FREE, THYROIDAB in the last 72 hours. Anemia Panel: No results for input(s): VITAMINB12, FOLATE, FERRITIN, TIBC, IRON, RETICCTPCT in the last 72 hours. Urine analysis:    Component Value Date/Time   COLORURINE AMBER (A) 05/05/2013 1621   APPEARANCEUR CLEAR 05/05/2013 1621   LABSPEC 1.025 05/05/2013 1621   PHURINE 5.5 05/05/2013 1621   GLUCOSEU NEGATIVE 05/05/2013 1621   HGBUR NEGATIVE 05/05/2013 1621   HGBUR negative 03/31/2009 1514   BILIRUBINUR SMALL (A) 05/05/2013 1621   KETONESUR NEGATIVE 05/05/2013 1621   PROTEINUR  NEGATIVE 05/05/2013 1621   UROBILINOGEN 1.0 05/05/2013 1621   NITRITE NEGATIVE 05/05/2013 1621   LEUKOCYTESUR NEGATIVE 05/05/2013 1621   Sepsis Labs: _0 (procalcitonin:4,lacticidven:4)  ) Recent Results (from the past 240 hour(s))  MRSA PCR Screening     Status: None   Collection Time: 04/12/17  3:06 PM  Result Value Ref Range Status   MRSA by PCR NEGATIVE NEGATIVE Final    Comment:        The GeneXpert MRSA Assay (FDA approved for NASAL specimens only), is one component of a  comprehensive MRSA colonization surveillance program. It is not intended to diagnose MRSA infection nor to guide or monitor treatment for MRSA infections.   Culture, respiratory (NON-Expectorated)     Status: None (Preliminary result)   Collection Time: 04/14/17  7:49 AM  Result Value Ref Range Status   Specimen Description TRACHEAL ASPIRATE  Final   Special Requests NONE  Final   Gram Stain   Final    ABUNDANT WBC PRESENT, PREDOMINANTLY PMN RARE SQUAMOUS EPITHELIAL CELLS PRESENT ABUNDANT GRAM POSITIVE RODS Performed at [facility], [address], [place],  [address]    Culture PENDING  Incomplete   Report Status PENDING  Incomplete         Radiology Studies: Dg Chest Port 1 View  Result Date: 04/15/2017 CLINICAL DATA:  Respiratory failure.  Endotracheal tube in place. EXAM: PORTABLE CHEST 1 VIEW COMPARISON:  One-view chest x-ray 04/14/2017 FINDINGS: The endotracheal tube remains 5.5 cm above the carina, in satisfactory position. The side port of the NG tube is just beyond the GE junction. Mild pulmonary vascular congestion is stable. Left greater than right basilar airspace disease remains with some improved aeration. A left pleural effusion is suspected. IMPRESSION: 1. Slight improved aeration with persistent left greater than right basilar airspace disease, likely representing atelectasis. 2. The support apparatus is stable. Electronically Signed   By: San Morelle  M.D.   On: 04/15/2017 07:47   Dg Chest Port 1 View  Result Date: 04/14/2017 CLINICAL DATA:  Respiratory failure EXAM: PORTABLE CHEST 1 VIEW COMPARISON:  Chest radiograph from one day prior. FINDINGS: Endotracheal tube tip is 3.3 cm above the carina. Enteric tube enters stomach with the tip not seen on this image. Stable cardiomediastinal silhouette with mild cardiomegaly. No pneumothorax. No pleural effusion. No overt pulmonary edema. Stable patchy consolidation and volume loss at the left lung base. IMPRESSION: 1. Well-positioned support structures.  No pneumothorax. 2. Stable patchy left lung base consolidation and volume loss, which could represent pneumonia, aspiration and/or atelectasis. Electronically Signed   By: Ilona Sorrel M.D.   On: 04/14/2017 10:49   Dg Chest Port 1 View  Result Date: 04/13/2017 CLINICAL DATA:  Respiratory failure and status post intubation. EXAM: PORTABLE CHEST 1 VIEW COMPARISON:  Film earlier today at 0600 hours FINDINGS: Endotracheal tube present with the tip approximately 6.5 cm above the carina. Gastric decompression tube extends below the diaphragm. There is substantial worsening of left lung airspace disease with reduced aeration of the left lung. Findings are likely representative of worsening pneumonia. There likely is an associated left pleural effusion. The right lung shows bronchovascular prominence and probable edema. IMPRESSION: Endotracheal tube tip approximately 6.5 cm above the carina. Gastric decompression tube extends below the diaphragm. Significant worsening left lung airspace disease likely representative of worsening pneumonia. There is likely an associated left pleural effusion. Probable component of pulmonary edema. Electronically Signed   By: Aletta Edouard M.D.   On: 04/13/2017 22:22        Scheduled Meds: . aspirin  300 mg Rectal Daily  . chlorhexidine gluconate (MEDLINE KIT)  15 mL Mouth Rinse BID  . enoxaparin (LOVENOX) injection  40 mg  Subcutaneous Q24H  . feeding supplement (PRO-STAT SUGAR FREE 64)  30 mL Per Tube BID  . feeding supplement (VITAL HIGH PROTEIN)  1,000 mL Per Tube Q24H  . insulin aspart  2-6 Units Subcutaneous Q4H  . insulin glargine  5 Units Subcutaneous QHS  . ipratropium  0.5 mg Nebulization Q6H  .  mouth rinse  15 mL Mouth Rinse QID  . methylPREDNISolone (SOLU-MEDROL) injection  80 mg Intravenous Q8H  . nicotine  14 mg Transdermal Daily  . pantoprazole (PROTONIX) IV  40 mg Intravenous Q24H   Continuous Infusions: . sodium chloride 75 mL/hr at 04/15/17 0700  . azithromycin Stopped (04/14/17 1804)  . cefTRIAXone (ROCEPHIN)  IV 1 g (04/15/17 0604)  . propofol (DIPRIVAN) infusion 40 mcg/kg/min (04/15/17 0700)     LOS: 3 days    Critical time spent: 45 minutes. Greater than 50% of this time was spent in direct contact with the patient coordinating care.     Lelon Frohlich, MD [facility] Pager [phone]  If 7PM-7AM, please contact night-coverage [email or web address] Password [facility] 04/15/2017, 9:33 AM

## 2017-04-16 ENCOUNTER — Inpatient Hospital Stay (HOSPITAL_COMMUNITY): Payer: Medicare PPO

## 2017-04-16 LAB — CBC
HCT: 41.7 % (ref 39.0–52.0)
Hemoglobin: 12.9 g/dL — ABNORMAL LOW (ref 13.0–17.0)
MCH: 26.1 pg (ref 26.0–34.0)
MCHC: 30.9 g/dL (ref 30.0–36.0)
MCV: 84.4 fL (ref 78.0–100.0)
PLATELETS: 198 10*3/uL (ref 150–400)
RBC: 4.94 MIL/uL (ref 4.22–5.81)
RDW: 17.7 % — AB (ref 11.5–15.5)
WBC: 7.6 10*3/uL (ref 4.0–10.5)

## 2017-04-16 LAB — BLOOD GAS, ARTERIAL
ACID-BASE EXCESS: 10.8 mmol/L — AB (ref 0.0–2.0)
BICARBONATE: 33.3 mmol/L — AB (ref 20.0–28.0)
DRAWN BY: 22223
FIO2: 50
MECHVT: 500 mL
O2 Saturation: 98 %
PEEP/CPAP: 5 cmH2O
PH ART: 7.421 (ref 7.350–7.450)
PO2 ART: 134 mmHg — AB (ref 83.0–108.0)
RATE: 18 resp/min
pCO2 arterial: 55.8 mmHg — ABNORMAL HIGH (ref 32.0–48.0)

## 2017-04-16 LAB — BASIC METABOLIC PANEL
Anion gap: 7 (ref 5–15)
BUN: 33 mg/dL — AB (ref 6–20)
CHLORIDE: 104 mmol/L (ref 101–111)
CO2: 36 mmol/L — ABNORMAL HIGH (ref 22–32)
CREATININE: 0.74 mg/dL (ref 0.61–1.24)
Calcium: 8.5 mg/dL — ABNORMAL LOW (ref 8.9–10.3)
GFR calc Af Amer: >60 mL/min (ref >60)
GLUCOSE: 229 mg/dL — AB (ref 65–99)
Potassium: 4.4 mmol/L (ref 3.5–5.1)
SODIUM: 147 mmol/L — AB (ref 135–145)

## 2017-04-16 LAB — GLUCOSE, CAPILLARY
GLUCOSE-CAPILLARY: 223 mg/dL — AB (ref 65–99)
GLUCOSE-CAPILLARY: 168 mg/dL — AB (ref 65–99)
GLUCOSE-CAPILLARY: 128 mg/dL — AB (ref 65–99)
GLUCOSE-CAPILLARY: 226 mg/dL — AB (ref 65–99)
Glucose-Capillary: 211 mg/dL — ABNORMAL HIGH (ref 65–99)

## 2017-04-16 NOTE — Progress Notes (Signed)
Patient OG tube confirmed via Xray. Will resume tube feedings starting at 10cc/hr per Dr. Jerilee Hoh and monitor residuals. Goal is 40cc/hr.

## 2017-04-16 NOTE — Plan of Care (Signed)
Problem: Nutritional: Goal: Intake of prescribed amount of daily calories will improve Outcome: Progressing Patient on tube feedings and tolerating well.

## 2017-04-16 NOTE — Progress Notes (Signed)
PROGRESS NOTE    BLISS TSANG  VQX:[id number] DOB: 1941-02-05 DOA: 04/12/2017 PCP: System, Provider Not In     Brief Narrative:  76 y/o man admitted to the hospital on 8/8 due to shortness of breath and unresponsiveness presumed due to COPD with acute exacerbation. He required BiPAP upon admission and unfortunately he failed BiPAP and was intubated on the night of 8/9. Overnight on 8/11 had difficulty with respiratory secretions with concern that there might be tube feedings he has required an increase in his FiO2 to 50%.  Assessment & Plan:   Principal Problem:   Acute respiratory failure with hypoxia and hypercapnia (HCC) Active Problems:   Diabetes mellitus (HCC)   Hyperlipidemia   TOBACCO ABUSE   Essential hypertension   ATRIAL FIBRILLATION   CHRONIC OBSTRUCTIVE PULMONARY DISEASE, ACUTE EXACERBATION   Acute hypoxic and hypercapnic respiratory failure -Patient failed noninvasive therapy and was intubated on 8/9. -Appreciate Dr. Luan Pulling input and recommendations regarding ventilator management -Post intubation chest x-ray seems to show pneumonia (CAP); continue Rocephin and azithromycin. Chest x-ray from 8/11 shows improving aereation, continue current antibiotics. -2-D echo with ejection fraction of 60-65% and grade 1 diastolic dysfunction. Received 40 mg of IV Lasix on 8/9. Do not see need for further Lasix at this time. -Had difficulty overnight with increased respiratory secretions and concern for possible aspiration with tube feedings. Required increase in FiO2 to 50%. -Nasogastric tube has been repositioned, chest x-ray has confirmed placement. Will restart tube feeds at 10 mL an hour, check for residuals with a goal rate of 40 mL an hour. -No attempts at weaning will be made today.   COPD with acute exacerbation -Continue steroids, nebs at current dose, less wheezing today. -See above for more details.  Tobacco abuse -Will need to discuss cessation once   extubated.  Atrial fibrillation -Rate controlled, not chronically anticoagulated, continue aspirin.  Type 2 diabetes -On ICU hyperglycemia protocol with 5 units of basal Lantus. -Improved glycemic control.  Hypertension -Fair control, will add when necessary hydralazine. -Resume oral meds once more alert.  GERD -Continue PPI.  Hyperlipidemia -Resume statin once more alert and off BiPAP.   DVT prophylaxis: lovenox Code Status: full code Family Communication: patient only  Disposition Plan: To be determined pending medical stability  Consultants:   Pulmonary, Dr. Luan Pulling  Procedures:   ECHO as above  Antimicrobials:  Anti-infectives    Start     Dose/Rate Route Frequency Ordered Stop   04/13/17 0745  cefTRIAXone (ROCEPHIN) 1 g in dextrose 5 % 50 mL IVPB     1 g 100 mL/hr over 30 Minutes Intravenous Every 24 hours 04/13/17 0743     04/12/17 1915  azithromycin (ZITHROMAX) 500 mg in dextrose 5 % 250 mL IVPB     500 mg 250 mL/hr over 60 Minutes Intravenous Daily-1800 04/12/17 1908 04/17/17 1759       Subjective: Intubated, sedated  Objective: Vitals:   04/16/17 1000 04/16/17 1137 04/16/17 1300 04/16/17 1420  BP: 123/61 (!) 100/54 102/60 92/60  Pulse: 67 68 60 62  Resp: _0 Temp: 98.1 F (36.7 C)  97.9 F (36.6 C)   TempSrc:      SpO2: 96% 94% 92% 91%  Weight:      Height:        Intake/Output Summary (Last 24 hours) at 04/16/17 1634 Last data filed at 04/16/17 1500  Gross per 24 hour  Intake          3237.27  ml  Output             1650 ml  Net          1587.27 ml   Filed Weights   04/14/17 0600 04/15/17 0600 04/16/17 0500  Weight: 83.4 kg (183 lb 13.8 oz) 86.8 kg (191 lb 5.8 oz) 87.2 kg (192 lb 3.9 oz)    Examination:  General exam: Intubated, sedated Respiratory system: Coarse bilateral breath sounds Cardiovascular system:RRR. No murmurs, rubs, gallops. Gastrointestinal system: Abdomen is nondistended, soft and nontender. No  organomegaly or masses felt. Normal bowel sounds heard. Central nervous system: Unable to assess this patient is currently sedated on the ventilator Extremities: No C/C/E, +pedal pulses Skin: No rashes, lesions or ulcers Psychiatry: Unable to assess given current mental state        Data Reviewed: I have personally reviewed following labs and imaging studies  CBC:  Recent Labs Lab 04/12/17 1200 04/13/17 0605 04/15/17 0445 04/16/17 0429  WBC 9.2 11.6* 9.4 7.6  NEUTROABS 7.2  --   --   --   HGB 13.6 14.5 12.8* 12.9*  HCT 44.1 46.8 40.5 41.7  MCV 85.5 85.4 83.0 84.4  PLT 216 219 225 924   Basic Metabolic Panel:  Recent Labs Lab 04/12/17 1200 04/13/17 0605 04/14/17 0446 04/14/17 1642 04/15/17 0445 04/15/17 1631 04/16/17 0429  NA 141 142  --   --  143  --  147*  K 4.4 4.4  --   --  3.6  --  4.4  CL 97* 99*  --   --  99*  --  104  CO2 36* 35*  --   --  37*  --  36*  GLUCOSE 162* 214*  --   --  173*  --  229*  BUN 16 22*  --   --  36*  --  33*  CREATININE 0.72 0.67  --   --  0.76  --  0.74  CALCIUM 8.9 9.0  --   --  8.7*  --  8.5*  MG  --   --  2.3 2.3 2.2 2.2  --   PHOS  --   --  1.9* 2.7 2.9 3.4  --    GFR: Estimated Creatinine Clearance: 81.1 mL/min (by C-G formula based on SCr of 0.74 mg/dL). Liver Function Tests:  Recent Labs Lab 04/12/17 1200  AST 28  ALT 40  ALKPHOS 87  BILITOT 0.7  PROT 6.9  ALBUMIN 3.1*   No results for input(s): LIPASE, AMYLASE in the last 168 hours. No results for input(s): AMMONIA in the last 168 hours. Coagulation Profile: No results for input(s): INR, PROTIME in the last 168 hours. Cardiac Enzymes:  Recent Labs Lab 04/12/17 1200  TROPONINI 0.03*   BNP (last 3 results) No results for input(s): PROBNP in the last 8760 hours. HbA1C: No results for input(s): HGBA1C in the last 72 hours. CBG:  Recent Labs Lab 04/15/17 1946 04/15/17 2359 04/16/17 0421 04/16/17 0732 04/16/17 1113  GLUCAP 213* 218* 211* 223* 168*    Lipid Profile:  Recent Labs  04/14/17 0446  TRIG 173*   Thyroid Function Tests: No results for input(s): TSH, T4TOTAL, FREET4, T3FREE, THYROIDAB in the last 72 hours. Anemia Panel: No results for input(s): VITAMINB12, FOLATE, FERRITIN, TIBC, IRON, RETICCTPCT in the last 72 hours. Urine analysis:    Component Value Date/Time   COLORURINE AMBER (A) 05/05/2013 1621   APPEARANCEUR CLEAR 05/05/2013 1621   LABSPEC 1.025 05/05/2013 1621  PHURINE 5.5 05/05/2013 1621   GLUCOSEU NEGATIVE 05/05/2013 1621   HGBUR NEGATIVE 05/05/2013 1621   HGBUR negative 03/31/2009 1514   BILIRUBINUR SMALL (A) 05/05/2013 1621   KETONESUR NEGATIVE 05/05/2013 1621   PROTEINUR NEGATIVE 05/05/2013 1621   UROBILINOGEN 1.0 05/05/2013 1621   NITRITE NEGATIVE 05/05/2013 1621   LEUKOCYTESUR NEGATIVE 05/05/2013 1621   Sepsis Labs: _0 (procalcitonin:4,lacticidven:4)  ) Recent Results (from the past 240 hour(s))  MRSA PCR Screening     Status: None   Collection Time: 04/12/17  3:06 PM  Result Value Ref Range Status   MRSA by PCR NEGATIVE NEGATIVE Final    Comment:        The GeneXpert MRSA Assay (FDA approved for NASAL specimens only), is one component of a comprehensive MRSA colonization surveillance program. It is not intended to diagnose MRSA infection nor to guide or monitor treatment for MRSA infections.   Culture, respiratory (NON-Expectorated)     Status: None (Preliminary result)   Collection Time: 04/14/17  7:49 AM  Result Value Ref Range Status   Specimen Description TRACHEAL ASPIRATE  Final   Special Requests NONE  Final   Gram Stain   Final    ABUNDANT WBC PRESENT, PREDOMINANTLY PMN RARE SQUAMOUS EPITHELIAL CELLS PRESENT ABUNDANT GRAM POSITIVE RODS    Culture   Final    CULTURE REINCUBATED FOR BETTER GROWTH Performed at [facility], [place] [address], [place], [place] [address]    Report Status PENDING  Incomplete         Radiology Studies: Dg Chest Port 1  View  Result Date: 04/16/2017 CLINICAL DATA:  Check nasogastric catheter placement EXAM: PORTABLE CHEST 1 VIEW COMPARISON:  04/16/2017 FINDINGS: Endotracheal tube and nasogastric catheter are seen. The nasogastric catheter extends into the stomach. The lungs are well aerated bilaterally. Mild left basilar atelectasis is again seen. IMPRESSION: Nasogastric catheter within the stomach. Electronically Signed   By: Inez Catalina M.D.   On: 04/16/2017 12:25   Dg Chest Port 1 View  Result Date: 04/16/2017 CLINICAL DATA:  Respiratory failure EXAM: PORTABLE CHEST 1 VIEW COMPARISON:  04/15/2017 FINDINGS: Cardiac shadow is mildly enlarged but stable. Endotracheal tube is noted in satisfactory position. The nasogastric catheter has been removed in the interval. Lungs are well aerated bilaterally. Mild left basilar atelectasis is seen. No acute bony abnormality is noted. IMPRESSION: Nasogastric catheter appears to have been removed. Left basilar atelectasis. Electronically Signed   By: Inez Catalina M.D.   On: 04/16/2017 09:42   Dg Chest Port 1 View  Result Date: 04/15/2017 CLINICAL DATA:  Acute respiratory failure EXAM: PORTABLE CHEST 1 VIEW COMPARISON:  04/15/2017 FINDINGS: Endotracheal tube and nasogastric catheter are again seen and stable. Cardiac shadow is within normal limits. The lungs are well aerated. The left base is not well evaluated on this exam. No new focal abnormality is seen. IMPRESSION: Tubes and lines as described. Somewhat limited exam with poor visualization of the left base. Electronically Signed   By: Inez Catalina M.D.   On: 04/15/2017 10:59   Dg Chest Port 1 View  Result Date: 04/15/2017 CLINICAL DATA:  Respiratory failure.  Endotracheal tube in place. EXAM: PORTABLE CHEST 1 VIEW COMPARISON:  One-view chest x-ray 04/14/2017 FINDINGS: The endotracheal tube remains 5.5 cm above the carina, in satisfactory position. The side port of the NG tube is just beyond the GE junction. Mild pulmonary  vascular congestion is stable. Left greater than right basilar airspace disease remains with some improved aeration. A left  pleural effusion is suspected. IMPRESSION: 1. Slight improved aeration with persistent left greater than right basilar airspace disease, likely representing atelectasis. 2. The support apparatus is stable. Electronically Signed   By: San Morelle M.D.   On: 04/15/2017 07:47        Scheduled Meds: . aspirin  300 mg Rectal Daily  . chlorhexidine gluconate (MEDLINE KIT)  15 mL Mouth Rinse BID  . enoxaparin (LOVENOX) injection  40 mg Subcutaneous Q24H  . feeding supplement (PRO-STAT SUGAR FREE 64)  30 mL Per Tube BID  . feeding supplement (VITAL HIGH PROTEIN)  1,000 mL Per Tube Q24H  . insulin aspart  2-6 Units Subcutaneous Q4H  . insulin glargine  5 Units Subcutaneous QHS  . ipratropium-albuterol  3 mL Nebulization Q6H  . mouth rinse  15 mL Mouth Rinse QID  . methylPREDNISolone (SOLU-MEDROL) injection  80 mg Intravenous Q8H  . nicotine  14 mg Transdermal Daily  . pantoprazole (PROTONIX) IV  40 mg Intravenous Q24H   Continuous Infusions: . sodium chloride 75 mL/hr at 04/16/17 1037  . azithromycin Stopped (04/15/17 1804)  . cefTRIAXone (ROCEPHIN)  IV Stopped (04/16/17 0900)  . propofol (DIPRIVAN) infusion 40 mcg/kg/min (04/16/17 1245)     LOS: 4 days    Critical time spent: 45 minutes. Greater than 50% of this time was spent in direct contact with the patient coordinating care.     Lelon Frohlich, MD [facility] Pager [phone]  If 7PM-7AM, please contact night-coverage [email or web address] Password [facility] 04/16/2017, 4:34 PM

## 2017-04-16 NOTE — Progress Notes (Signed)
Subjective: He had more trouble last night and required increased FiO2. He has a lot more secretions this morning and there is concern that they may look like tube feeding. He has a lot of oropharyngeal secretions as well.  Objective: Vital signs in last 24 hours: Temp:  [97.2 F (36.2 C)-99.1 F (37.3 C)] 98.6 F (37 C) (08/12 0700) Pulse Rate:  [60-86] 67 (08/12 0830) Resp:  [12-30] 18 (08/12 0830) BP: (103-166)/(60-91) 129/70 (08/12 0830) SpO2:  [89 %-98 %] 94 % (08/12 0835) FiO2 (%):  [40 %-50 %] 50 % (08/12 0835) Weight:  [87.2 kg (192 lb 3.9 oz)] 87.2 kg (192 lb 3.9 oz) (08/12 0500) Weight change: 0.4 kg (14.1 oz) Last BM Date: 04/14/17  Intake/Output from previous day: 08/11 0701 - 08/12 0700 In: 2665.7 [I.V.:2135.7; NG/GT:280; IV Piggyback:250] Out: 1650 [Urine:1650]  PHYSICAL EXAM General appearance: Intubated sedated on mechanical ventilation Resp: rhonchi bilaterally and wheezes bilaterally Cardio: regular rate and rhythm, S1, S2 normal, no murmur, click, rub or gallop GI: soft, non-tender; bowel sounds normal; no masses,  no organomegaly Extremities: extremities normal, atraumatic, no cyanosis or edema Skin warm and dry  Lab Results:  Results for orders placed or performed during the hospital encounter of 04/12/17 (from the past 48 hour(s))  Glucose, capillary     Status: Abnormal   Collection Time: 04/14/17 11:20 AM  Result Value Ref Range   Glucose-Capillary 220 (H) 65 - 99 mg/dL   Comment 1 Notify RN    Comment 2 Document in Chart   Glucose, capillary     Status: Abnormal   Collection Time: 04/14/17  4:29 PM  Result Value Ref Range   Glucose-Capillary 233 (H) 65 - 99 mg/dL   Comment 1 Notify RN    Comment 2 Document in Chart   Magnesium     Status: None   Collection Time: 04/14/17  4:42 PM  Result Value Ref Range   Magnesium 2.3 1.7 - 2.4 mg/dL  Phosphorus     Status: None   Collection Time: 04/14/17  4:42 PM  Result Value Ref Range   Phosphorus 2.7  2.5 - 4.6 mg/dL  Glucose, capillary     Status: Abnormal   Collection Time: 04/14/17  7:48 PM  Result Value Ref Range   Glucose-Capillary 275 (H) 65 - 99 mg/dL   Comment 1 Notify RN    Comment 2 Document in Chart   Glucose, capillary     Status: Abnormal   Collection Time: 04/14/17  8:57 PM  Result Value Ref Range   Glucose-Capillary 285 (H) 65 - 99 mg/dL  Glucose, capillary     Status: Abnormal   Collection Time: 04/14/17 10:07 PM  Result Value Ref Range   Glucose-Capillary 225 (H) 65 - 99 mg/dL   Comment 1 Notify RN    Comment 2 Document in Chart   Glucose, capillary     Status: Abnormal   Collection Time: 04/15/17 12:03 AM  Result Value Ref Range   Glucose-Capillary 187 (H) 65 - 99 mg/dL  Glucose, capillary     Status: Abnormal   Collection Time: 04/15/17  4:00 AM  Result Value Ref Range   Glucose-Capillary 171 (H) 65 - 99 mg/dL  Magnesium     Status: None   Collection Time: 04/15/17  4:45 AM  Result Value Ref Range   Magnesium 2.2 1.7 - 2.4 mg/dL  Phosphorus     Status: None   Collection Time: 04/15/17  4:45 AM  Result Value Ref  Range   Phosphorus 2.9 2.5 - 4.6 mg/dL  Basic metabolic panel     Status: Abnormal   Collection Time: 04/15/17  4:45 AM  Result Value Ref Range   Sodium 143 135 - 145 mmol/L   Potassium 3.6 3.5 - 5.1 mmol/L    Comment: DELTA CHECK NOTED   Chloride 99 (L) 101 - 111 mmol/L   CO2 37 (H) 22 - 32 mmol/L   Glucose, Bld 173 (H) 65 - 99 mg/dL   BUN 36 (H) 6 - 20 mg/dL   Creatinine, Ser 0.76 0.61 - 1.24 mg/dL   Calcium 8.7 (L) 8.9 - 10.3 mg/dL   GFR calc non Af Amer >60 >60 mL/min   GFR calc Af Amer >60 >60 mL/min    Comment: (NOTE) The eGFR has been calculated using the CKD EPI equation. This calculation has not been validated in all clinical situations. eGFR's persistently <60 mL/min signify possible Chronic Kidney Disease.    Anion gap 7 5 - 15  CBC     Status: Abnormal   Collection Time: 04/15/17  4:45 AM  Result Value Ref Range   WBC  9.4 4.0 - 10.5 K/uL   RBC 4.88 4.22 - 5.81 MIL/uL   Hemoglobin 12.8 (L) 13.0 - 17.0 g/dL   HCT 40.5 39.0 - 52.0 %   MCV 83.0 78.0 - 100.0 fL   MCH 26.2 26.0 - 34.0 pg   MCHC 31.6 30.0 - 36.0 g/dL   RDW 17.8 (H) 11.5 - 15.5 %   Platelets 225 150 - 400 K/uL  Blood gas, arterial     Status: Abnormal   Collection Time: 04/15/17  4:56 AM  Result Value Ref Range   FIO2 40.00    Delivery systems VENTILATOR    Mode PRESSURE REGULATED VOLUME CONTROL    VT 500 mL   LHR 16 resp/min   Peep/cpap 5.0 cm H20   pH, Arterial 7.457 (H) 7.350 - 7.450   pCO2 arterial 55.2 (H) 32.0 - 48.0 mmHg   pO2, Arterial 99.3 83.0 - 108.0 mmHg   Bicarbonate 36.2 (H) 20.0 - 28.0 mmol/L   Acid-Base Excess 13.6 (H) 0.0 - 2.0 mmol/L   O2 Saturation 96.7 %   Patient temperature 37.0    Collection site LEFT RADIAL    Drawn by [phone]    Sample type ARTERIAL DRAW    Allens test (pass/fail) PASS PASS  Glucose, capillary     Status: Abnormal   Collection Time: 04/15/17  7:52 AM  Result Value Ref Range   Glucose-Capillary 154 (H) 65 - 99 mg/dL  Glucose, capillary     Status: Abnormal   Collection Time: 04/15/17 11:20 AM  Result Value Ref Range   Glucose-Capillary 151 (H) 65 - 99 mg/dL  Magnesium     Status: None   Collection Time: 04/15/17  4:31 PM  Result Value Ref Range   Magnesium 2.2 1.7 - 2.4 mg/dL  Phosphorus     Status: None   Collection Time: 04/15/17  4:31 PM  Result Value Ref Range   Phosphorus 3.4 2.5 - 4.6 mg/dL  Glucose, capillary     Status: Abnormal   Collection Time: 04/15/17  4:42 PM  Result Value Ref Range   Glucose-Capillary 124 (H) 65 - 99 mg/dL  Glucose, capillary     Status: Abnormal   Collection Time: 04/15/17  7:46 PM  Result Value Ref Range   Glucose-Capillary 213 (H) 65 - 99 mg/dL  Glucose, capillary  Status: Abnormal   Collection Time: 04/15/17 11:59 PM  Result Value Ref Range   Glucose-Capillary 218 (H) 65 - 99 mg/dL  Glucose, capillary     Status: Abnormal   Collection  Time: 04/16/17  4:21 AM  Result Value Ref Range   Glucose-Capillary 211 (H) 65 - 99 mg/dL  Basic metabolic panel     Status: Abnormal   Collection Time: 04/16/17  4:29 AM  Result Value Ref Range   Sodium 147 (H) 135 - 145 mmol/L   Potassium 4.4 3.5 - 5.1 mmol/L    Comment: DELTA CHECK NOTED   Chloride 104 101 - 111 mmol/L   CO2 36 (H) 22 - 32 mmol/L   Glucose, Bld 229 (H) 65 - 99 mg/dL   BUN 33 (H) 6 - 20 mg/dL   Creatinine, Ser 0.74 0.61 - 1.24 mg/dL   Calcium 8.5 (L) 8.9 - 10.3 mg/dL   GFR calc non Af Amer >60 >60 mL/min   GFR calc Af Amer >60 >60 mL/min    Comment: (NOTE) The eGFR has been calculated using the CKD EPI equation. This calculation has not been validated in all clinical situations. eGFR's persistently <60 mL/min signify possible Chronic Kidney Disease.    Anion gap 7 5 - 15  CBC     Status: Abnormal   Collection Time: 04/16/17  4:29 AM  Result Value Ref Range   WBC 7.6 4.0 - 10.5 K/uL   RBC 4.94 4.22 - 5.81 MIL/uL   Hemoglobin 12.9 (L) 13.0 - 17.0 g/dL   HCT 41.7 39.0 - 52.0 %   MCV 84.4 78.0 - 100.0 fL   MCH 26.1 26.0 - 34.0 pg   MCHC 30.9 30.0 - 36.0 g/dL   RDW 17.7 (H) 11.5 - 15.5 %   Platelets 198 150 - 400 K/uL  Blood gas, arterial     Status: Abnormal   Collection Time: 04/16/17  5:10 AM  Result Value Ref Range   FIO2 50.00    Delivery systems VENTILATOR    Mode PRESSURE REGULATED VOLUME CONTROL    VT 500 mL   LHR 18 resp/min   Peep/cpap 5.0 cm H20   pH, Arterial 7.421 7.350 - 7.450   pCO2 arterial 55.8 (H) 32.0 - 48.0 mmHg   pO2, Arterial 134.0 (H) 83.0 - 108.0 mmHg   Bicarbonate 33.3 (H) 20.0 - 28.0 mmol/L   Acid-Base Excess 10.8 (H) 0.0 - 2.0 mmol/L   O2 Saturation 98.0 %   Collection site RIGHT RADIAL    Drawn by [id number]    Sample type ARTERIAL    Allens test (pass/fail) PASS PASS  Glucose, capillary     Status: Abnormal   Collection Time: 04/16/17  7:32 AM  Result Value Ref Range   Glucose-Capillary 223 (H) 65 - 99 mg/dL     ABGS  Recent Labs  04/16/17 0510  PHART 7.421  PO2ART 134.0*  HCO3 33.3*   CULTURES Recent Results (from the past 240 hour(s))  MRSA PCR Screening     Status: None   Collection Time: 04/12/17  3:06 PM  Result Value Ref Range Status   MRSA by PCR NEGATIVE NEGATIVE Final    Comment:        The GeneXpert MRSA Assay (FDA approved for NASAL specimens only), is one component of a comprehensive MRSA colonization surveillance program. It is not intended to diagnose MRSA infection nor to guide or monitor treatment for MRSA infections.   Culture, respiratory (NON-Expectorated)  Status: None (Preliminary result)   Collection Time: 04/14/17  7:49 AM  Result Value Ref Range Status   Specimen Description TRACHEAL ASPIRATE  Final   Special Requests NONE  Final   Gram Stain   Final    ABUNDANT WBC PRESENT, PREDOMINANTLY PMN RARE SQUAMOUS EPITHELIAL CELLS PRESENT ABUNDANT GRAM POSITIVE RODS    Culture   Final    CULTURE REINCUBATED FOR BETTER GROWTH Performed at [facility], [place] [address], [place], [place] [address]    Report Status PENDING  Incomplete   Studies/Results: Dg Chest Port 1 View  Result Date: 04/15/2017 CLINICAL DATA:  Acute respiratory failure EXAM: PORTABLE CHEST 1 VIEW COMPARISON:  04/15/2017 FINDINGS: Endotracheal tube and nasogastric catheter are again seen and stable. Cardiac shadow is within normal limits. The lungs are well aerated. The left base is not well evaluated on this exam. No new focal abnormality is seen. IMPRESSION: Tubes and lines as described. Somewhat limited exam with poor visualization of the left base. Electronically Signed   By: Inez Catalina M.D.   On: 04/15/2017 10:59   Dg Chest Port 1 View  Result Date: 04/15/2017 CLINICAL DATA:  Respiratory failure.  Endotracheal tube in place. EXAM: PORTABLE CHEST 1 VIEW COMPARISON:  One-view chest x-ray 04/14/2017 FINDINGS: The endotracheal tube remains 5.5 cm above the carina, in  satisfactory position. The side port of the NG tube is just beyond the GE junction. Mild pulmonary vascular congestion is stable. Left greater than right basilar airspace disease remains with some improved aeration. A left pleural effusion is suspected. IMPRESSION: 1. Slight improved aeration with persistent left greater than right basilar airspace disease, likely representing atelectasis. 2. The support apparatus is stable. Electronically Signed   By: San Morelle M.D.   On: 04/15/2017 07:47   Dg Chest Port 1 View  Result Date: 04/14/2017 CLINICAL DATA:  Respiratory failure EXAM: PORTABLE CHEST 1 VIEW COMPARISON:  Chest radiograph from one day prior. FINDINGS: Endotracheal tube tip is 3.3 cm above the carina. Enteric tube enters stomach with the tip not seen on this image. Stable cardiomediastinal silhouette with mild cardiomegaly. No pneumothorax. No pleural effusion. No overt pulmonary edema. Stable patchy consolidation and volume loss at the left lung base. IMPRESSION: 1. Well-positioned support structures.  No pneumothorax. 2. Stable patchy left lung base consolidation and volume loss, which could represent pneumonia, aspiration and/or atelectasis. Electronically Signed   By: Ilona Sorrel M.D.   On: 04/14/2017 10:49    Medications:  Prior to Admission:  Prescriptions Prior to Admission  Medication Sig Dispense Refill Last Dose  . albuterol (PROVENTIL HFA;VENTOLIN HFA) 108 (90 BASE) MCG/ACT inhaler Inhale 2 puffs into the lungs every 4 (four) hours as needed for wheezing.    Taking  . albuterol (PROVENTIL) (2.5 MG/3ML) 0.083% nebulizer solution Take 2.5 mg by nebulization every 4 (four) hours as needed for wheezing or shortness of breath.   Taking  . aspirin 325 MG tablet Take 325 mg by mouth daily.   04/11/2017 at Unknown time  . Cyanocobalamin (VITAMIN B-12) 2500 MCG SUBL Place 2,500 mcg under the tongue daily.   04/11/2017 at Unknown time  . cyclobenzaprine (FLEXERIL) 10 MG tablet Take 5 mg  by mouth at bedtime.   04/11/2017 at Unknown time  . diltiazem (TIAZAC) 300 MG 24 hr capsule Take 300 mg by mouth daily.    04/11/2017 at Unknown time  . diphenhydrAMINE (BENADRYL) 25 mg capsule Take 25 mg by mouth at bedtime as needed.  04/11/2017 at Unknown time  . furosemide (LASIX) 20 MG tablet Take 40 mg by mouth daily.    04/11/2017 at Unknown time  . gabapentin (NEURONTIN) 400 MG capsule Take 400 mg by mouth 4 (four) times daily.   04/11/2017 at Unknown time  . glipiZIDE (GLUCOTROL) 5 MG tablet Take 5 mg by mouth daily before breakfast.   04/11/2017 at Unknown time  . ipratropium-albuterol (DUONEB) 0.5-2.5 (3) MG/3ML SOLN Take 3 mLs by nebulization QID.    04/11/2017 at Unknown time  . metoCLOPramide (REGLAN) 10 MG tablet Take 10 mg by mouth 4 (four) times daily.   04/11/2017 at Unknown time  . montelukast (SINGULAIR) 10 MG tablet Take 10 mg by mouth daily.   04/11/2017 at Unknown time  . omeprazole (PRILOSEC) 40 MG capsule Take 40 mg by mouth daily.   04/11/2017 at Unknown time  . rosuvastatin (CRESTOR) 20 MG tablet Take 1 tablet (20 mg total) by mouth daily. 30 tablet 3 04/11/2017 at Unknown time  . saxagliptin HCl (ONGLYZA) 5 MG TABS tablet Take 5 mg by mouth daily.   04/11/2017 at Unknown time  . tiZANidine (ZANAFLEX) 4 MG tablet Take 4 mg by mouth 3 (three) times daily.   04/11/2017 at Unknown time  . umeclidinium-vilanterol (ANORO ELLIPTA) 62.5-25 MCG/INH AEPB Inhale 1 puff into the lungs daily.   04/11/2017 at Unknown time  . cefdinir (OMNICEF) 300 MG capsule Take 300 mg by mouth 2 (two) times daily.   Completed Course at Unknown time  . insulin aspart (NOVOLOG) 100 UNIT/ML injection Per sliding scale   Taking  . metFORMIN (GLUMETZA) 1000 MG (MOD) 24 hr tablet Take 1,000 mg by mouth daily with breakfast.   Not Taking at Unknown time   Scheduled: . aspirin  300 mg Rectal Daily  . chlorhexidine gluconate (MEDLINE KIT)  15 mL Mouth Rinse BID  . enoxaparin (LOVENOX) injection  40 mg Subcutaneous Q24H  . feeding  supplement (PRO-STAT SUGAR FREE 64)  30 mL Per Tube BID  . feeding supplement (VITAL HIGH PROTEIN)  1,000 mL Per Tube Q24H  . insulin aspart  2-6 Units Subcutaneous Q4H  . insulin glargine  5 Units Subcutaneous QHS  . ipratropium-albuterol  3 mL Nebulization Q6H  . mouth rinse  15 mL Mouth Rinse QID  . methylPREDNISolone (SOLU-MEDROL) injection  80 mg Intravenous Q8H  . nicotine  14 mg Transdermal Daily  . pantoprazole (PROTONIX) IV  40 mg Intravenous Q24H   Continuous: . sodium chloride 75 mL/hr at 04/15/17 1804  . azithromycin Stopped (04/15/17 1804)  . cefTRIAXone (ROCEPHIN)  IV 1 g (04/16/17 0820)  . propofol (DIPRIVAN) infusion 40 mcg/kg/min (04/16/17 0820)   [id number] **OR** acetaminophen, albuterol, hydrALAZINE, ondansetron **OR** ondansetron (ZOFRAN) IV, senna-docusate  Assesment: He was admitted with acute hypoxic and hypercapnic respiratory failure. He had altered mental status and I think he aspirated during this. He has a left lower lobe pneumonia by chest x-ray the chest x-ray this morning is pending. He was started on tube feedings but he has a lot of increased secretions and there is concern that he may have tube feeding aspiration. He has a lot of oral secretions as well. He has acute exacerbation of COPD. Principal Problem:   Acute respiratory failure with hypoxia and hypercapnia (HCC) Active Problems:   Diabetes mellitus (HCC)   Hyperlipidemia   TOBACCO ABUSE   Essential hypertension   ATRIAL FIBRILLATION   CHRONIC OBSTRUCTIVE PULMONARY DISEASE, ACUTE EXACERBATION    Plan: Continue ventilator support.  His blood gas looks okay on 50% but I don't think we have any opportunity to wean today with the increase in secretions. Check chest x-ray to see if he has more diffuse infiltrate. Hold tube feedings until we get a chest x-ray back.    LOS: 4 days   Nagee Goates L 04/16/2017, 8:52 AM

## 2017-04-17 ENCOUNTER — Inpatient Hospital Stay (HOSPITAL_COMMUNITY): Payer: Medicare PPO

## 2017-04-17 LAB — BASIC METABOLIC PANEL
Anion gap: 7 (ref 5–15)
BUN: 30 mg/dL — AB (ref 6–20)
CHLORIDE: 104 mmol/L (ref 101–111)
CO2: 34 mmol/L — AB (ref 22–32)
Calcium: 8.6 mg/dL — ABNORMAL LOW (ref 8.9–10.3)
Creatinine, Ser: 0.69 mg/dL (ref 0.61–1.24)
GFR calc Af Amer: >60 mL/min (ref >60)
GFR calc non Af Amer: >60 mL/min (ref >60)
Glucose, Bld: 152 mg/dL — ABNORMAL HIGH (ref 65–99)
POTASSIUM: 4.2 mmol/L (ref 3.5–5.1)
Sodium: 145 mmol/L (ref 135–145)

## 2017-04-17 LAB — BLOOD GAS, ARTERIAL
Acid-Base Excess: 11.2 mmol/L — ABNORMAL HIGH (ref 0.0–2.0)
BICARBONATE: 33.6 mmol/L — AB (ref 20.0–28.0)
FIO2: 55
LHR: 18 {breaths}/min
O2 Saturation: 95.6 %
PEEP/CPAP: 5 cmH2O
VT: 500 mL
pCO2 arterial: 56 mmHg — ABNORMAL HIGH (ref 32.0–48.0)
pH, Arterial: 7.424 (ref 7.350–7.450)
pO2, Arterial: 92.1 mmHg (ref 83.0–108.0)

## 2017-04-17 LAB — GLUCOSE, CAPILLARY
GLUCOSE-CAPILLARY: 147 mg/dL — AB (ref 65–99)
GLUCOSE-CAPILLARY: 205 mg/dL — AB (ref 65–99)
GLUCOSE-CAPILLARY: 231 mg/dL — AB (ref 65–99)
Glucose-Capillary: 155 mg/dL — ABNORMAL HIGH (ref 65–99)
Glucose-Capillary: 204 mg/dL — ABNORMAL HIGH (ref 65–99)
Glucose-Capillary: 233 mg/dL — ABNORMAL HIGH (ref 65–99)
Glucose-Capillary: 250 mg/dL — ABNORMAL HIGH (ref 65–99)

## 2017-04-17 LAB — CBC
HEMATOCRIT: 41.8 % (ref 39.0–52.0)
Hemoglobin: 13 g/dL (ref 13.0–17.0)
MCH: 25.9 pg — ABNORMAL LOW (ref 26.0–34.0)
MCHC: 31.1 g/dL (ref 30.0–36.0)
MCV: 83.4 fL (ref 78.0–100.0)
Platelets: 168 10*3/uL (ref 150–400)
RBC: 5.01 MIL/uL (ref 4.22–5.81)
RDW: 17.7 % — ABNORMAL HIGH (ref 11.5–15.5)
WBC: 10 10*3/uL (ref 4.0–10.5)

## 2017-04-17 LAB — TRIGLYCERIDES: Triglycerides: 174 mg/dL — ABNORMAL HIGH (ref <150)

## 2017-04-17 NOTE — Progress Notes (Signed)
Subjective: He remains intubated and on the ventilator. More problems yesterday noted. His chest x-ray this morning doesn't show any increase in the infiltrate but he has had to have his oxygen turned up to 55%. Secretions appear to still be an issue.  Objective: Vital signs in last 24 hours: Temp:  [97.5 F (36.4 C)-98.8 F (37.1 C)] 98.2 F (36.8 C) (08/13 0600) Pulse Rate:  [60-75] 64 (08/13 0600) Resp:  [18-20] 18 (08/13 0600) BP: (92-139)/(53-82) 119/65 (08/13 0600) SpO2:  [90 %-96 %] 95 % (08/13 0600) FiO2 (%):  [50 %-55 %] 55 % (08/13 0600) Weight:  [86 kg (189 lb 9.5 oz)] 86 kg (189 lb 9.5 oz) (08/13 0500) Weight change: -1.2 kg (-2 lb 10.3 oz) Last BM Date: 04/16/17  Intake/Output from previous day: 08/12 0701 - 08/13 0700 In: 2938.9 [I.V.:1900.2; NG/GT:988.7; IV Piggyback:50] Out: 1250 [Urine:1250]  PHYSICAL EXAM General appearance: He remains intubated sedated and on the ventilator Resp: rhonchi bilaterally Cardio: regular rate and rhythm, S1, S2 normal, no murmur, click, rub or gallop GI: soft, non-tender; bowel sounds normal; no masses,  no organomegaly Extremities: extremities normal, atraumatic, no cyanosis or edema Skin warm and dry  Lab Results:  Results for orders placed or performed during the hospital encounter of 04/12/17 (from the past 48 hour(s))  Glucose, capillary     Status: Abnormal   Collection Time: 04/15/17  7:52 AM  Result Value Ref Range   Glucose-Capillary 154 (H) 65 - 99 mg/dL  Glucose, capillary     Status: Abnormal   Collection Time: 04/15/17 11:20 AM  Result Value Ref Range   Glucose-Capillary 151 (H) 65 - 99 mg/dL  Magnesium     Status: None   Collection Time: 04/15/17  4:31 PM  Result Value Ref Range   Magnesium 2.2 1.7 - 2.4 mg/dL  Phosphorus     Status: None   Collection Time: 04/15/17  4:31 PM  Result Value Ref Range   Phosphorus 3.4 2.5 - 4.6 mg/dL  Glucose, capillary     Status: Abnormal   Collection Time: 04/15/17  4:42  PM  Result Value Ref Range   Glucose-Capillary 124 (H) 65 - 99 mg/dL  Glucose, capillary     Status: Abnormal   Collection Time: 04/15/17  7:46 PM  Result Value Ref Range   Glucose-Capillary 213 (H) 65 - 99 mg/dL  Glucose, capillary     Status: Abnormal   Collection Time: 04/15/17 11:59 PM  Result Value Ref Range   Glucose-Capillary 218 (H) 65 - 99 mg/dL  Glucose, capillary     Status: Abnormal   Collection Time: 04/16/17  4:21 AM  Result Value Ref Range   Glucose-Capillary 211 (H) 65 - 99 mg/dL  Basic metabolic panel     Status: Abnormal   Collection Time: 04/16/17  4:29 AM  Result Value Ref Range   Sodium 147 (H) 135 - 145 mmol/L   Potassium 4.4 3.5 - 5.1 mmol/L    Comment: DELTA CHECK NOTED   Chloride 104 101 - 111 mmol/L   CO2 36 (H) 22 - 32 mmol/L   Glucose, Bld 229 (H) 65 - 99 mg/dL   BUN 33 (H) 6 - 20 mg/dL   Creatinine, Ser 0.74 0.61 - 1.24 mg/dL   Calcium 8.5 (L) 8.9 - 10.3 mg/dL   GFR calc non Af Amer >60 >60 mL/min   GFR calc Af Amer >60 >60 mL/min    Comment: (NOTE) The eGFR has been calculated using the CKD  EPI equation. This calculation has not been validated in all clinical situations. eGFR's persistently <60 mL/min signify possible Chronic Kidney Disease.    Anion gap 7 5 - 15  CBC     Status: Abnormal   Collection Time: 04/16/17  4:29 AM  Result Value Ref Range   WBC 7.6 4.0 - 10.5 K/uL   RBC 4.94 4.22 - 5.81 MIL/uL   Hemoglobin 12.9 (L) 13.0 - 17.0 g/dL   HCT 41.7 39.0 - 52.0 %   MCV 84.4 78.0 - 100.0 fL   MCH 26.1 26.0 - 34.0 pg   MCHC 30.9 30.0 - 36.0 g/dL   RDW 17.7 (H) 11.5 - 15.5 %   Platelets 198 150 - 400 K/uL  Blood gas, arterial     Status: Abnormal   Collection Time: 04/16/17  5:10 AM  Result Value Ref Range   FIO2 50.00    Delivery systems VENTILATOR    Mode PRESSURE REGULATED VOLUME CONTROL    VT 500 mL   LHR 18 resp/min   Peep/cpap 5.0 cm H20   pH, Arterial 7.421 7.350 - 7.450   pCO2 arterial 55.8 (H) 32.0 - 48.0 mmHg   pO2,  Arterial 134.0 (H) 83.0 - 108.0 mmHg   Bicarbonate 33.3 (H) 20.0 - 28.0 mmol/L   Acid-Base Excess 10.8 (H) 0.0 - 2.0 mmol/L   O2 Saturation 98.0 %   Collection site RIGHT RADIAL    Drawn by [id number]    Sample type ARTERIAL    Allens test (pass/fail) PASS PASS  Glucose, capillary     Status: Abnormal   Collection Time: 04/16/17  7:32 AM  Result Value Ref Range   Glucose-Capillary 223 (H) 65 - 99 mg/dL  Glucose, capillary     Status: Abnormal   Collection Time: 04/16/17 11:13 AM  Result Value Ref Range   Glucose-Capillary 168 (H) 65 - 99 mg/dL  Glucose, capillary     Status: Abnormal   Collection Time: 04/16/17  5:04 PM  Result Value Ref Range   Glucose-Capillary 128 (H) 65 - 99 mg/dL  Glucose, capillary     Status: Abnormal   Collection Time: 04/16/17  7:52 PM  Result Value Ref Range   Glucose-Capillary 226 (H) 65 - 99 mg/dL  Glucose, capillary     Status: Abnormal   Collection Time: 04/17/17 12:06 AM  Result Value Ref Range   Glucose-Capillary 205 (H) 65 - 99 mg/dL  Basic metabolic panel     Status: Abnormal   Collection Time: 04/17/17  4:09 AM  Result Value Ref Range   Sodium 145 135 - 145 mmol/L   Potassium 4.2 3.5 - 5.1 mmol/L   Chloride 104 101 - 111 mmol/L   CO2 34 (H) 22 - 32 mmol/L   Glucose, Bld 152 (H) 65 - 99 mg/dL   BUN 30 (H) 6 - 20 mg/dL   Creatinine, Ser 0.69 0.61 - 1.24 mg/dL   Calcium 8.6 (L) 8.9 - 10.3 mg/dL   GFR calc non Af Amer >60 >60 mL/min   GFR calc Af Amer >60 >60 mL/min    Comment: (NOTE) The eGFR has been calculated using the CKD EPI equation. This calculation has not been validated in all clinical situations. eGFR's persistently <60 mL/min signify possible Chronic Kidney Disease.    Anion gap 7 5 - 15  CBC     Status: Abnormal   Collection Time: 04/17/17  4:09 AM  Result Value Ref Range   WBC 10.0 4.0 - 10.5 K/uL  RBC 5.01 4.22 - 5.81 MIL/uL   Hemoglobin 13.0 13.0 - 17.0 g/dL   HCT 41.8 39.0 - 52.0 %   MCV 83.4 78.0 - 100.0 fL   MCH  25.9 (L) 26.0 - 34.0 pg   MCHC 31.1 30.0 - 36.0 g/dL   RDW 17.7 (H) 11.5 - 15.5 %   Platelets 168 150 - 400 K/uL  Triglycerides     Status: Abnormal   Collection Time: 04/17/17  4:09 AM  Result Value Ref Range   Triglycerides 174 (H) <150 mg/dL  Glucose, capillary     Status: Abnormal   Collection Time: 04/17/17  4:22 AM  Result Value Ref Range   Glucose-Capillary 147 (H) 65 - 99 mg/dL  Blood gas, arterial     Status: Abnormal   Collection Time: 04/17/17  5:20 AM  Result Value Ref Range   FIO2 55.00    Delivery systems VENTILATOR    Mode PRESSURE REGULATED VOLUME CONTROL    VT 500 mL   LHR 18 resp/min   Peep/cpap 5.0 cm H20   pH, Arterial 7.424 7.350 - 7.450   pCO2 arterial 56.0 (H) 32.0 - 48.0 mmHg   pO2, Arterial 92.1 83.0 - 108.0 mmHg   Bicarbonate 33.6 (H) 20.0 - 28.0 mmol/L   Acid-Base Excess 11.2 (H) 0.0 - 2.0 mmol/L   O2 Saturation 95.6 %   Collection site RIGHT RADIAL    Drawn by COLLECTED BY RT    Sample type ARTERIAL    Allens test (pass/fail) PASS PASS    ABGS  Recent Labs  04/17/17 0520  PHART 7.424  PO2ART 92.1  HCO3 33.6*   CULTURES Recent Results (from the past 240 hour(s))  MRSA PCR Screening     Status: None   Collection Time: 04/12/17  3:06 PM  Result Value Ref Range Status   MRSA by PCR NEGATIVE NEGATIVE Final    Comment:        The GeneXpert MRSA Assay (FDA approved for NASAL specimens only), is one component of a comprehensive MRSA colonization surveillance program. It is not intended to diagnose MRSA infection nor to guide or monitor treatment for MRSA infections.   Culture, respiratory (NON-Expectorated)     Status: None (Preliminary result)   Collection Time: 04/14/17  7:49 AM  Result Value Ref Range Status   Specimen Description TRACHEAL ASPIRATE  Final   Special Requests NONE  Final   Gram Stain   Final    ABUNDANT WBC PRESENT, PREDOMINANTLY PMN RARE SQUAMOUS EPITHELIAL CELLS PRESENT ABUNDANT GRAM POSITIVE RODS    Culture    Final    CULTURE REINCUBATED FOR BETTER GROWTH Performed at [facility], [place] [address], [place], [place] [address]    Report Status PENDING  Incomplete   Studies/Results: Dg Chest Port 1 View  Result Date: 04/17/2017 CLINICAL DATA:  Respiratory failure EXAM: PORTABLE CHEST 1 VIEW COMPARISON:  Chest radiograph from one day prior. FINDINGS: Endotracheal tube tip is 3.9 cm above the carina. Enteric tube enters stomach with the tip not seen on this image. Stable cardiomediastinal silhouette with normal heart size. No pneumothorax. No pleural effusion. Patchy mild left lung base opacity is not appreciably changed. Cephalization of the pulmonary vasculature without overt pulmonary edema. IMPRESSION: 1. Well-positioned support structures. 2. Stable mild patchy left lung base opacity. Electronically Signed   By: Ilona Sorrel M.D.   On: 04/17/2017 07:31   Dg Chest Port 1 View  Result Date: 04/16/2017 CLINICAL DATA:  Check nasogastric  catheter placement EXAM: PORTABLE CHEST 1 VIEW COMPARISON:  04/16/2017 FINDINGS: Endotracheal tube and nasogastric catheter are seen. The nasogastric catheter extends into the stomach. The lungs are well aerated bilaterally. Mild left basilar atelectasis is again seen. IMPRESSION: Nasogastric catheter within the stomach. Electronically Signed   By: Inez Catalina M.D.   On: 04/16/2017 12:25   Dg Chest Port 1 View  Result Date: 04/16/2017 CLINICAL DATA:  Respiratory failure EXAM: PORTABLE CHEST 1 VIEW COMPARISON:  04/15/2017 FINDINGS: Cardiac shadow is mildly enlarged but stable. Endotracheal tube is noted in satisfactory position. The nasogastric catheter has been removed in the interval. Lungs are well aerated bilaterally. Mild left basilar atelectasis is seen. No acute bony abnormality is noted. IMPRESSION: Nasogastric catheter appears to have been removed. Left basilar atelectasis. Electronically Signed   By: Inez Catalina M.D.   On: 04/16/2017 09:42   Dg Chest Port 1  View  Result Date: 04/15/2017 CLINICAL DATA:  Acute respiratory failure EXAM: PORTABLE CHEST 1 VIEW COMPARISON:  04/15/2017 FINDINGS: Endotracheal tube and nasogastric catheter are again seen and stable. Cardiac shadow is within normal limits. The lungs are well aerated. The left base is not well evaluated on this exam. No new focal abnormality is seen. IMPRESSION: Tubes and lines as described. Somewhat limited exam with poor visualization of the left base. Electronically Signed   By: Inez Catalina M.D.   On: 04/15/2017 10:59    Medications:  Prior to Admission:  Prescriptions Prior to Admission  Medication Sig Dispense Refill Last Dose  . albuterol (PROVENTIL HFA;VENTOLIN HFA) 108 (90 BASE) MCG/ACT inhaler Inhale 2 puffs into the lungs every 4 (four) hours as needed for wheezing.    Taking  . albuterol (PROVENTIL) (2.5 MG/3ML) 0.083% nebulizer solution Take 2.5 mg by nebulization every 4 (four) hours as needed for wheezing or shortness of breath.   Taking  . aspirin 325 MG tablet Take 325 mg by mouth daily.   04/11/2017 at Unknown time  . Cyanocobalamin (VITAMIN B-12) 2500 MCG SUBL Place 2,500 mcg under the tongue daily.   04/11/2017 at Unknown time  . cyclobenzaprine (FLEXERIL) 10 MG tablet Take 5 mg by mouth at bedtime.   04/11/2017 at Unknown time  . diltiazem (TIAZAC) 300 MG 24 hr capsule Take 300 mg by mouth daily.    04/11/2017 at Unknown time  . diphenhydrAMINE (BENADRYL) 25 mg capsule Take 25 mg by mouth at bedtime as needed.   04/11/2017 at Unknown time  . furosemide (LASIX) 20 MG tablet Take 40 mg by mouth daily.    04/11/2017 at Unknown time  . gabapentin (NEURONTIN) 400 MG capsule Take 400 mg by mouth 4 (four) times daily.   04/11/2017 at Unknown time  . glipiZIDE (GLUCOTROL) 5 MG tablet Take 5 mg by mouth daily before breakfast.   04/11/2017 at Unknown time  . ipratropium-albuterol (DUONEB) 0.5-2.5 (3) MG/3ML SOLN Take 3 mLs by nebulization QID.    04/11/2017 at Unknown time  . metoCLOPramide (REGLAN)  10 MG tablet Take 10 mg by mouth 4 (four) times daily.   04/11/2017 at Unknown time  . montelukast (SINGULAIR) 10 MG tablet Take 10 mg by mouth daily.   04/11/2017 at Unknown time  . omeprazole (PRILOSEC) 40 MG capsule Take 40 mg by mouth daily.   04/11/2017 at Unknown time  . rosuvastatin (CRESTOR) 20 MG tablet Take 1 tablet (20 mg total) by mouth daily. 30 tablet 3 04/11/2017 at Unknown time  . saxagliptin HCl (ONGLYZA) 5 MG TABS tablet  Take 5 mg by mouth daily.   04/11/2017 at Unknown time  . tiZANidine (ZANAFLEX) 4 MG tablet Take 4 mg by mouth 3 (three) times daily.   04/11/2017 at Unknown time  . umeclidinium-vilanterol (ANORO ELLIPTA) 62.5-25 MCG/INH AEPB Inhale 1 puff into the lungs daily.   04/11/2017 at Unknown time  . cefdinir (OMNICEF) 300 MG capsule Take 300 mg by mouth 2 (two) times daily.   Completed Course at Unknown time  . insulin aspart (NOVOLOG) 100 UNIT/ML injection Per sliding scale   Taking  . metFORMIN (GLUMETZA) 1000 MG (MOD) 24 hr tablet Take 1,000 mg by mouth daily with breakfast.   Not Taking at Unknown time   Scheduled: . aspirin  300 mg Rectal Daily  . chlorhexidine gluconate (MEDLINE KIT)  15 mL Mouth Rinse BID  . enoxaparin (LOVENOX) injection  40 mg Subcutaneous Q24H  . feeding supplement (PRO-STAT SUGAR FREE 64)  30 mL Per Tube BID  . feeding supplement (VITAL HIGH PROTEIN)  1,000 mL Per Tube Q24H  . insulin aspart  2-6 Units Subcutaneous Q4H  . insulin glargine  5 Units Subcutaneous QHS  . ipratropium-albuterol  3 mL Nebulization Q6H  . mouth rinse  15 mL Mouth Rinse QID  . methylPREDNISolone (SOLU-MEDROL) injection  80 mg Intravenous Q8H  . nicotine  14 mg Transdermal Daily  . pantoprazole (PROTONIX) IV  40 mg Intravenous Q24H   Continuous: . sodium chloride 75 mL/hr at 04/16/17 1037  . cefTRIAXone (ROCEPHIN)  IV Stopped (04/16/17 0900)  . propofol (DIPRIVAN) infusion 40 mcg/kg/min (04/17/17 0600)   TPN:[id number] **OR** acetaminophen, albuterol, hydrALAZINE,  ondansetron **OR** ondansetron (ZOFRAN) IV, senna-docusate  Assesment: He has acute hypoxic and hypercapnic respiratory failure. He has COPD exacerbation. He has community-acquired pneumonia. He is still requiring ventilator support. He may have aspirated tube feedings but I don't think it's entirely clear. He still has significant secretions. He is still requiring increased oxygen. Chest x-ray which I personally reviewed actually looks about the same with no increased infiltration. Principal Problem:   Acute respiratory failure with hypoxia and hypercapnia (HCC) Active Problems:   Diabetes mellitus ([place])   Hyperlipidemia   TOBACCO ABUSE   Essential hypertension   ATRIAL FIBRILLATION   CHRONIC OBSTRUCTIVE PULMONARY DISEASE, ACUTE EXACERBATION    Plan: Continue current treatments. Try to wean his oxygen. Not ready to come off the ventilator   LOS: 5 days   Suhan Paci L 04/17/2017, 7:49 AM

## 2017-04-17 NOTE — Progress Notes (Signed)
PROGRESS NOTE    Andrew [name]  QBH:[id number] DOB: 02-11-1941 DOA: 04/12/2017 PCP: System, Provider Not In     Brief Narrative:  76 y/o man admitted to the hospital on 8/8 due to shortness of breath and unresponsiveness presumed due to COPD with acute exacerbation. He required BiPAP upon admission and unfortunately he failed BiPAP and was intubated on the night of 8/9. Secretions continue to be an issue, however chest x-ray do not show any change in his infiltrate.  Assessment & Plan:   Principal Problem:   Acute respiratory failure with hypoxia and hypercapnia (HCC) Active Problems:   Diabetes mellitus (HCC)   Hyperlipidemia   TOBACCO ABUSE   Essential hypertension   ATRIAL FIBRILLATION   CHRONIC OBSTRUCTIVE PULMONARY DISEASE, ACUTE EXACERBATION   Acute hypoxic and hypercapnic respiratory failure -Patient failed noninvasive therapy and was intubated on 8/9. -Appreciate Dr. Luan Pulling input and recommendations regarding ventilator management -Post intubation chest x-ray seems to show pneumonia (CAP); continue Rocephin and azithromycin. Chest x-ray from 8/11 shows improving aereation, continue current antibiotics. -2-D echo with ejection fraction of 60-65% and grade 1 diastolic dysfunction. Received 40 mg of IV Lasix on 8/9. Do not see need for further Lasix at this time. -Again had difficulty overnight with increased respiratory secretions requiring an increase in FiO2 to 55, this has now been weaned back down to 50. -No attempts at weaning will be made today.   COPD with acute exacerbation -Continue steroids, nebs at current dose, no wheezing on exam today -See above for more details.  Tobacco abuse -Will need to discuss cessation once  extubated.  Atrial fibrillation -Rate controlled, not chronically anticoagulated, continue aspirin.  Type 2 diabetes -On ICU hyperglycemia protocol with 5 units of basal Lantus. -Improved glycemic control.  Hypertension -Fair control, when  necessary hydralazine -Resume oral meds once more alert.  GERD -Continue PPI.  Hyperlipidemia -Resume statin once more alert and off BiPAP.   DVT prophylaxis: lovenox Code Status: full code Family Communication: patient only  Disposition Plan: To be determined pending medical stability  Consultants:   Pulmonary, Dr. Luan Pulling  Procedures:   ECHO as above  Antimicrobials:  Anti-infectives    Start     Dose/Rate Route Frequency Ordered Stop   04/13/17 0745  cefTRIAXone (ROCEPHIN) 1 g in dextrose 5 % 50 mL IVPB     1 g 100 mL/hr over 30 Minutes Intravenous Every 24 hours 04/13/17 0743     04/12/17 1915  azithromycin (ZITHROMAX) 500 mg in dextrose 5 % 250 mL IVPB     500 mg 250 mL/hr over 60 Minutes Intravenous Daily-1800 04/12/17 1908 04/16/17 1809       Subjective: Intubated, sedated  Objective: Vitals:   04/17/17 1534 04/17/17 1600 04/17/17 1652 04/17/17 1700  BP:  (!) 147/72  (!) 155/76  Pulse: 82 84 82 86  Resp: '18 18 18 17  ' Temp:  99 F (37.2 C) 99.1 F (37.3 C) 99.1 F (37.3 C)  TempSrc:      SpO2: 95% 93% 94% 93%  Weight:      Height:        Intake/Output Summary (Last 24 hours) at 04/17/17 1808 Last data filed at 04/17/17 1700  Gross per 24 hour  Intake             2876 ml  Output             1550 ml  Net  1326 ml   Filed Weights   04/15/17 0600 04/16/17 0500 04/17/17 0500  Weight: 86.8 kg (191 lb 5.8 oz) 87.2 kg (192 lb 3.9 oz) 86 kg (189 lb 9.5 oz)    Examination:  General exam: Intubated, sedated Respiratory system: Minimal expiratory wheezes, coarse bilateral breath sounds Cardiovascular system:RRR. No murmurs, rubs, gallops. Gastrointestinal system: Abdomen is nondistended, soft and nontender. No organomegaly or masses felt. Normal bowel sounds heard. Central nervous system: Alert and oriented. No focal neurological deficits. Extremities: No C/C/E, +pedal pulses Skin: No rashes, lesions or ulcers Psychiatry: Judgement and  insight appear normal. Mood & affect appropriate.          Data Reviewed: I have personally reviewed following labs and imaging studies  CBC:  Recent Labs Lab 04/12/17 1200 04/13/17 0605 04/15/17 0445 04/16/17 0429 04/17/17 0409  WBC 9.2 11.6* 9.4 7.6 10.0  NEUTROABS 7.2  --   --   --   --   HGB 13.6 14.5 12.8* 12.9* 13.0  HCT 44.1 46.8 40.5 41.7 41.8  MCV 85.5 85.4 83.0 84.4 83.4  PLT 216 219 225 198 478   Basic Metabolic Panel:  Recent Labs Lab 04/12/17 1200 04/13/17 0605 04/14/17 0446 04/14/17 1642 04/15/17 0445 04/15/17 1631 04/16/17 0429 04/17/17 0409  NA 141 142  --   --  143  --  147* 145  K 4.4 4.4  --   --  3.6  --  4.4 4.2  CL 97* 99*  --   --  99*  --  104 104  CO2 36* 35*  --   --  37*  --  36* 34*  GLUCOSE 162* 214*  --   --  173*  --  229* 152*  BUN 16 22*  --   --  36*  --  33* 30*  CREATININE 0.72 0.67  --   --  0.76  --  0.74 0.69  CALCIUM 8.9 9.0  --   --  8.7*  --  8.5* 8.6*  MG  --   --  2.3 2.3 2.2 2.2  --   --   PHOS  --   --  1.9* 2.7 2.9 3.4  --   --    GFR: Estimated Creatinine Clearance: 81.1 mL/min (by C-G formula based on SCr of 0.69 mg/dL). Liver Function Tests:  Recent Labs Lab 04/12/17 1200  AST 28  ALT 40  ALKPHOS 87  BILITOT 0.7  PROT 6.9  ALBUMIN 3.1*   No results for input(s): LIPASE, AMYLASE in the last 168 hours. No results for input(s): AMMONIA in the last 168 hours. Coagulation Profile: No results for input(s): INR, PROTIME in the last 168 hours. Cardiac Enzymes:  Recent Labs Lab 04/12/17 1200  TROPONINI 0.03*   BNP (last 3 results) No results for input(s): PROBNP in the last 8760 hours. HbA1C: No results for input(s): HGBA1C in the last 72 hours. CBG:  Recent Labs Lab 04/17/17 0006 04/17/17 0422 04/17/17 0757 04/17/17 1132 04/17/17 1654  GLUCAP 205* 147* 155* 250* 233*   Lipid Profile:  Recent Labs  04/17/17 0409  TRIG 174*   Thyroid Function Tests: No results for input(s): TSH,  T4TOTAL, FREET4, T3FREE, THYROIDAB in the last 72 hours. Anemia Panel: No results for input(s): VITAMINB12, FOLATE, FERRITIN, TIBC, IRON, RETICCTPCT in the last 72 hours. Urine analysis:    Component Value Date/Time   COLORURINE AMBER (A) 05/05/2013 1621   APPEARANCEUR CLEAR 05/05/2013 1621   LABSPEC 1.025 05/05/2013 1621  PHURINE 5.5 05/05/2013 1621   GLUCOSEU NEGATIVE 05/05/2013 1621   HGBUR NEGATIVE 05/05/2013 1621   HGBUR negative 03/31/2009 1514   BILIRUBINUR SMALL (A) 05/05/2013 1621   KETONESUR NEGATIVE 05/05/2013 1621   PROTEINUR NEGATIVE 05/05/2013 1621   UROBILINOGEN 1.0 05/05/2013 1621   NITRITE NEGATIVE 05/05/2013 1621   LEUKOCYTESUR NEGATIVE 05/05/2013 1621   Sepsis Labs: '@LABRCNTIP' (procalcitonin:4,lacticidven:4)  ) Recent Results (from the past 240 hour(s))  MRSA PCR Screening     Status: None   Collection Time: 04/12/17  3:06 PM  Result Value Ref Range Status   MRSA by PCR NEGATIVE NEGATIVE Final    Comment:        The GeneXpert MRSA Assay (FDA approved for NASAL specimens only), is one component of a comprehensive MRSA colonization surveillance program. It is not intended to diagnose MRSA infection nor to guide or monitor treatment for MRSA infections.   Culture, respiratory (NON-Expectorated)     Status: None (Preliminary result)   Collection Time: 04/14/17  7:49 AM  Result Value Ref Range Status   Specimen Description TRACHEAL ASPIRATE  Final   Special Requests NONE  Final   Gram Stain   Final    ABUNDANT WBC PRESENT, PREDOMINANTLY PMN RARE SQUAMOUS EPITHELIAL CELLS PRESENT ABUNDANT GRAM POSITIVE RODS    Culture   Final    CULTURE REINCUBATED FOR BETTER GROWTH Performed at [facility], [place] [address], [place], [place] [address]    Report Status PENDING  Incomplete         Radiology Studies: Dg Chest Port 1 View  Result Date: 04/17/2017 CLINICAL DATA:  Respiratory failure EXAM: PORTABLE CHEST 1 VIEW COMPARISON:  Chest radiograph  from one day prior. FINDINGS: Endotracheal tube tip is 3.9 cm above the carina. Enteric tube enters stomach with the tip not seen on this image. Stable cardiomediastinal silhouette with normal heart size. No pneumothorax. No pleural effusion. Patchy mild left lung base opacity is not appreciably changed. Cephalization of the pulmonary vasculature without overt pulmonary edema. IMPRESSION: 1. Well-positioned support structures. 2. Stable mild patchy left lung base opacity. Electronically Signed   By: Ilona Sorrel M.D.   On: 04/17/2017 07:31   Dg Chest Port 1 View  Result Date: 04/16/2017 CLINICAL DATA:  Check nasogastric catheter placement EXAM: PORTABLE CHEST 1 VIEW COMPARISON:  04/16/2017 FINDINGS: Endotracheal tube and nasogastric catheter are seen. The nasogastric catheter extends into the stomach. The lungs are well aerated bilaterally. Mild left basilar atelectasis is again seen. IMPRESSION: Nasogastric catheter within the stomach. Electronically Signed   By: Inez Catalina M.D.   On: 04/16/2017 12:25   Dg Chest Port 1 View  Result Date: 04/16/2017 CLINICAL DATA:  Respiratory failure EXAM: PORTABLE CHEST 1 VIEW COMPARISON:  04/15/2017 FINDINGS: Cardiac shadow is mildly enlarged but stable. Endotracheal tube is noted in satisfactory position. The nasogastric catheter has been removed in the interval. Lungs are well aerated bilaterally. Mild left basilar atelectasis is seen. No acute bony abnormality is noted. IMPRESSION: Nasogastric catheter appears to have been removed. Left basilar atelectasis. Electronically Signed   By: Inez Catalina M.D.   On: 04/16/2017 09:42        Scheduled Meds: . aspirin  300 mg Rectal Daily  . chlorhexidine gluconate (MEDLINE KIT)  15 mL Mouth Rinse BID  . enoxaparin (LOVENOX) injection  40 mg Subcutaneous Q24H  . feeding supplement (PRO-STAT SUGAR FREE 64)  30 mL Per Tube BID  . feeding supplement (VITAL HIGH PROTEIN)  1,000 mL Per  Tube Q24H  . insulin aspart  2-6  Units Subcutaneous Q4H  . insulin glargine  5 Units Subcutaneous QHS  . ipratropium-albuterol  3 mL Nebulization Q6H  . mouth rinse  15 mL Mouth Rinse QID  . methylPREDNISolone (SOLU-MEDROL) injection  80 mg Intravenous Q8H  . nicotine  14 mg Transdermal Daily  . pantoprazole (PROTONIX) IV  40 mg Intravenous Q24H   Continuous Infusions: . sodium chloride 75 mL/hr at 04/16/17 1037  . cefTRIAXone (ROCEPHIN)  IV Stopped (04/17/17 0912)  . propofol (DIPRIVAN) infusion 40 mcg/kg/min (04/17/17 1355)     LOS: 5 days    Critical time spent: 45 minutes. Greater than 50% of this time was spent in direct contact with the patient coordinating care.     Lelon Frohlich, MD [facility] Pager [phone]  If 7PM-7AM, please contact night-coverage [email or web address] Password [facility] 04/17/2017, 6:08 PM

## 2017-04-18 ENCOUNTER — Inpatient Hospital Stay (HOSPITAL_COMMUNITY): Payer: Medicare PPO

## 2017-04-18 LAB — BLOOD GAS, ARTERIAL
ACID-BASE EXCESS: 8.7 mmol/L — AB (ref 0.0–2.0)
BICARBONATE: 31.4 mmol/L — AB (ref 20.0–28.0)
Drawn by: 382351
FIO2: 45
LHR: 18 {breaths}/min
O2 SAT: 94 %
PATIENT TEMPERATURE: 37
PCO2 ART: 53.2 mmHg — AB (ref 32.0–48.0)
PEEP/CPAP: 5 cmH2O
PH ART: 7.415 (ref 7.350–7.450)
PO2 ART: 79.5 mmHg — AB (ref 83.0–108.0)
VT: 500 mL

## 2017-04-18 LAB — BASIC METABOLIC PANEL
ANION GAP: 7 (ref 5–15)
BUN: 32 mg/dL — ABNORMAL HIGH (ref 6–20)
CALCIUM: 8.7 mg/dL — AB (ref 8.9–10.3)
CO2: 32 mmol/L (ref 22–32)
Chloride: 105 mmol/L (ref 101–111)
Creatinine, Ser: 0.62 mg/dL (ref 0.61–1.24)
GFR calc Af Amer: >60 mL/min (ref >60)
GLUCOSE: 177 mg/dL — AB (ref 65–99)
Potassium: 4.7 mmol/L (ref 3.5–5.1)
Sodium: 144 mmol/L (ref 135–145)

## 2017-04-18 LAB — GLUCOSE, CAPILLARY
GLUCOSE-CAPILLARY: 172 mg/dL — AB (ref 65–99)
GLUCOSE-CAPILLARY: 186 mg/dL — AB (ref 65–99)
GLUCOSE-CAPILLARY: 235 mg/dL — AB (ref 65–99)
GLUCOSE-CAPILLARY: 166 mg/dL — AB (ref 65–99)
GLUCOSE-CAPILLARY: 225 mg/dL — AB (ref 65–99)

## 2017-04-18 LAB — CBC
HCT: 43.1 % (ref 39.0–52.0)
HEMOGLOBIN: 13.6 g/dL (ref 13.0–17.0)
MCH: 26.3 pg (ref 26.0–34.0)
MCHC: 31.6 g/dL (ref 30.0–36.0)
MCV: 83.2 fL (ref 78.0–100.0)
Platelets: 188 10*3/uL (ref 150–400)
RBC: 5.18 MIL/uL (ref 4.22–5.81)
RDW: 17.8 % — AB (ref 11.5–15.5)
WBC: 9.9 10*3/uL (ref 4.0–10.5)

## 2017-04-18 LAB — CULTURE, RESPIRATORY W GRAM STAIN

## 2017-04-18 LAB — CULTURE, RESPIRATORY

## 2017-04-18 NOTE — Progress Notes (Signed)
PROGRESS NOTE    Andrew [name]  WRU:[id number] DOB: March 16, 1941 DOA: 04/12/2017 PCP: System, Provider Not In     Brief Narrative:  76 y/o man admitted to the hospital on 8/8 due to shortness of breath and unresponsiveness presumed due to COPD with acute exacerbation. He required BiPAP upon admission and unfortunately he failed BiPAP and was intubated on the night of 8/9. Secretions continue to be an issue, however chest x-ray do not show any change in his infiltrate, in fact there may be improvement. On 8/13 has been weaning on the ventilator.  Assessment & Plan:   Principal Problem:   Acute respiratory failure with hypoxia and hypercapnia (HCC) Active Problems:   Diabetes mellitus (HCC)   Hyperlipidemia   TOBACCO ABUSE   Essential hypertension   ATRIAL FIBRILLATION   CHRONIC OBSTRUCTIVE PULMONARY DISEASE, ACUTE EXACERBATION   Acute hypoxic and hypercapnic respiratory failure -Patient failed noninvasive therapy and was intubated on 8/9. -Appreciate Dr. Luan Pulling input and recommendations regarding ventilator management -Post intubation chest x-ray seems to show pneumonia (CAP); continue Rocephin and azithromycin. Chest x-ray from 8/11 shows improving aereation, continue current antibiotics. -2-D echo with ejection fraction of 60-65% and grade 1 diastolic dysfunction. Received 40 mg of IV Lasix on 8/9. Do not see need for further Lasix at this time. -Is improving, FiO2 was able to be weaned down to 45. Weaning will be attempted today 8/14.   COPD with acute exacerbation -Continue steroids, nebs at current dose, no wheezing on exam today -See above for more details.  Tobacco abuse -Will need to discuss cessation once  extubated.  Atrial fibrillation -Rate controlled, not chronically anticoagulated, continue aspirin.  Type 2 diabetes -On ICU hyperglycemia protocol with 5 units of basal Lantus. -Improved glycemic control.  Hypertension -Fair control, when necessary  hydralazine -Resume oral meds once more alert.  GERD -Continue PPI.  Hyperlipidemia -Resume statin once more alert and extubated.   DVT prophylaxis: lovenox Code Status: full code Family Communication: patient only  Disposition Plan: To be determined pending medical stability  Consultants:   Pulmonary, Dr. Luan Pulling  Procedures:   ECHO as above  Antimicrobials:  Anti-infectives    Start     Dose/Rate Route Frequency Ordered Stop   04/13/17 0745  cefTRIAXone (ROCEPHIN) 1 g in dextrose 5 % 50 mL IVPB     1 g 100 mL/hr over 30 Minutes Intravenous Every 24 hours 04/13/17 0743     04/12/17 1915  azithromycin (ZITHROMAX) 500 mg in dextrose 5 % 250 mL IVPB     500 mg 250 mL/hr over 60 Minutes Intravenous Daily-1800 04/12/17 1908 04/16/17 1809       Subjective: Intubated, sedated  Objective: Vitals:   04/18/17 1208 04/18/17 1500 04/18/17 1518 04/18/17 1621  BP:      Pulse:    (!) 54  Resp:    18  Temp:    98.6 F (37 C)  TempSrc:      SpO2: 92% 93% 92% 91%  Weight:      Height:        Intake/Output Summary (Last 24 hours) at 04/18/17 1740 Last data filed at 04/18/17 1600  Gross per 24 hour  Intake          3240.57 ml  Output              750 ml  Net          2490.57 ml   Filed Weights   04/16/17 0500 04/17/17 0500 04/18/17  0500  Weight: 87.2 kg (192 lb 3.9 oz) 86 kg (189 lb 9.5 oz) 87.4 kg (192 lb 10.9 oz)    Examination:  General exam: Intubated, sedated Respiratory system: Minimal expiratory wheezes Cardiovascular system:RRR. No murmurs, rubs, gallops. Gastrointestinal system: Abdomen is nondistended, soft and nontender. No organomegaly or masses felt. Normal bowel sounds heard. Central nervous system: Unable to assess given current level of sedation Extremities: No C/C/E, +pedal pulses Skin: No rashes, lesions or ulcers Psychiatry: Unable to assess given current level of sedation          Data Reviewed: I have personally reviewed following  labs and imaging studies  CBC:  Recent Labs Lab 04/12/17 1200 04/13/17 0605 04/15/17 0445 04/16/17 0429 04/17/17 0409 04/18/17 0401  WBC 9.2 11.6* 9.4 7.6 10.0 9.9  NEUTROABS 7.2  --   --   --   --   --   HGB 13.6 14.5 12.8* 12.9* 13.0 13.6  HCT 44.1 46.8 40.5 41.7 41.8 43.1  MCV 85.5 85.4 83.0 84.4 83.4 83.2  PLT 216 219 225 198 168 832   Basic Metabolic Panel:  Recent Labs Lab 04/13/17 0605 04/14/17 0446 04/14/17 1642 04/15/17 0445 04/15/17 1631 04/16/17 0429 04/17/17 0409 04/18/17 0401  NA 142  --   --  143  --  147* 145 144  K 4.4  --   --  3.6  --  4.4 4.2 4.7  CL 99*  --   --  99*  --  104 104 105  CO2 35*  --   --  37*  --  36* 34* 32  GLUCOSE 214*  --   --  173*  --  229* 152* 177*  BUN 22*  --   --  36*  --  33* 30* 32*  CREATININE 0.67  --   --  0.76  --  0.74 0.69 0.62  CALCIUM 9.0  --   --  8.7*  --  8.5* 8.6* 8.7*  MG  --  2.3 2.3 2.2 2.2  --   --   --   PHOS  --  1.9* 2.7 2.9 3.4  --   --   --    GFR: Estimated Creatinine Clearance: 81.1 mL/min (by C-G formula based on SCr of 0.62 mg/dL). Liver Function Tests:  Recent Labs Lab 04/12/17 1200  AST 28  ALT 40  ALKPHOS 87  BILITOT 0.7  PROT 6.9  ALBUMIN 3.1*   No results for input(s): LIPASE, AMYLASE in the last 168 hours. No results for input(s): AMMONIA in the last 168 hours. Coagulation Profile: No results for input(s): INR, PROTIME in the last 168 hours. Cardiac Enzymes:  Recent Labs Lab 04/12/17 1200  TROPONINI 0.03*   BNP (last 3 results) No results for input(s): PROBNP in the last 8760 hours. HbA1C: No results for input(s): HGBA1C in the last 72 hours. CBG:  Recent Labs Lab 04/17/17 2352 04/18/17 0410 04/18/17 0736 04/18/17 1148 04/18/17 1624  GLUCAP 204* 172* 186* 235* 166*   Lipid Profile:  Recent Labs  04/17/17 0409  TRIG 174*   Thyroid Function Tests: No results for input(s): TSH, T4TOTAL, FREET4, T3FREE, THYROIDAB in the last 72 hours. Anemia Panel: No  results for input(s): VITAMINB12, FOLATE, FERRITIN, TIBC, IRON, RETICCTPCT in the last 72 hours. Urine analysis:    Component Value Date/Time   COLORURINE AMBER (A) 05/05/2013 1621   APPEARANCEUR CLEAR 05/05/2013 1621   LABSPEC 1.025 05/05/2013 1621   PHURINE 5.5 05/05/2013 1621  GLUCOSEU NEGATIVE 05/05/2013 1621   HGBUR NEGATIVE 05/05/2013 1621   HGBUR negative 03/31/2009 1514   BILIRUBINUR SMALL (A) 05/05/2013 1621   KETONESUR NEGATIVE 05/05/2013 1621   PROTEINUR NEGATIVE 05/05/2013 1621   UROBILINOGEN 1.0 05/05/2013 1621   NITRITE NEGATIVE 05/05/2013 1621   LEUKOCYTESUR NEGATIVE 05/05/2013 1621   Sepsis Labs: '@LABRCNTIP' (procalcitonin:4,lacticidven:4)  ) Recent Results (from the past 240 hour(s))  MRSA PCR Screening     Status: None   Collection Time: 04/12/17  3:06 PM  Result Value Ref Range Status   MRSA by PCR NEGATIVE NEGATIVE Final    Comment:        The GeneXpert MRSA Assay (FDA approved for NASAL specimens only), is one component of a comprehensive MRSA colonization surveillance program. It is not intended to diagnose MRSA infection nor to guide or monitor treatment for MRSA infections.   Culture, respiratory (NON-Expectorated)     Status: None   Collection Time: 04/14/17  7:49 AM  Result Value Ref Range Status   Specimen Description TRACHEAL ASPIRATE  Final   Special Requests NONE  Final   Gram Stain   Final    ABUNDANT WBC PRESENT, PREDOMINANTLY PMN RARE SQUAMOUS EPITHELIAL CELLS PRESENT ABUNDANT GRAM POSITIVE RODS Performed at [facility], [address], [place], [place] [address]    Culture   Final    ABUNDANT CORYNEBACTERIUM STRIATUM MODERATE ENTEROBACTER SPECIES    Report Status 04/18/2017 FINAL  Final   Organism ID, Bacteria ENTEROBACTER SPECIES  Final      Susceptibility   Enterobacter species - MIC*    CEFAZOLIN >=64 RESISTANT Resistant     CEFEPIME <=1 SENSITIVE Sensitive     CEFTAZIDIME <=1 SENSITIVE Sensitive     CEFTRIAXONE  <=1 SENSITIVE Sensitive     CIPROFLOXACIN <=0.25 SENSITIVE Sensitive     GENTAMICIN <=1 SENSITIVE Sensitive     IMIPENEM <=0.25 SENSITIVE Sensitive     TRIMETH/SULFA <=20 SENSITIVE Sensitive     PIP/TAZO <=4 SENSITIVE Sensitive     * MODERATE ENTEROBACTER SPECIES         Radiology Studies: Dg Chest Port 1 View  Result Date: 04/18/2017 CLINICAL DATA:  Endotracheal tube placement. Respiratory failure. Unresponsive on admission. EXAM: PORTABLE CHEST 1 VIEW COMPARISON:  Earlier the same date and 04/17/2017. FINDINGS: 1645 hour. The tip of the endotracheal tube is in the mid trachea. Nasogastric tube projects below the diaphragm. A line loops over the lower left chest, probably separate from the nasogastric tube, although incompletely visualized. The heart size and mediastinal contours are stable. There are stable bibasilar pulmonary opacities, most consistent with atelectasis. No pneumothorax or significant pleural effusion. IMPRESSION: No significant changes are seen. Patchy bibasilar opacities are stable, likely atelectasis. Endotracheal tube appears satisfactorily positioned. Line projecting over the heart shadow is probably separate from the adjacent nasogastric tube, although incompletely visualized. Electronically Signed   By: Richardean Sale M.D.   On: 04/18/2017 17:15   Dg Chest Port 1 View  Result Date: 04/18/2017 CLINICAL DATA:  Acute respiratory failure with hypoxemia. EXAM: PORTABLE CHEST 1 VIEW COMPARISON:  Radiograph of April 17, 2017. FINDINGS: Stable cardiomediastinal silhouette. Endotracheal and nasogastric tubes are unchanged in position. No pneumothorax or significant pleural effusion is noted. Stable mild central pulmonary vascular congestion is noted. Bony thorax is unremarkable. Mild bibasilar subsegmental atelectasis is noted. IMPRESSION: Stable support apparatus. Stable mild central pulmonary vascular congestion. Mild bibasilar subsegmental atelectasis. Electronically Signed    By: Marijo Conception, M.D.   On:  04/18/2017 07:34   Dg Chest Port 1 View  Result Date: 04/17/2017 CLINICAL DATA:  Respiratory failure EXAM: PORTABLE CHEST 1 VIEW COMPARISON:  Chest radiograph from one day prior. FINDINGS: Endotracheal tube tip is 3.9 cm above the carina. Enteric tube enters stomach with the tip not seen on this image. Stable cardiomediastinal silhouette with normal heart size. No pneumothorax. No pleural effusion. Patchy mild left lung base opacity is not appreciably changed. Cephalization of the pulmonary vasculature without overt pulmonary edema. IMPRESSION: 1. Well-positioned support structures. 2. Stable mild patchy left lung base opacity. Electronically Signed   By: Ilona Sorrel M.D.   On: 04/17/2017 07:31        Scheduled Meds: . aspirin  300 mg Rectal Daily  . chlorhexidine gluconate (MEDLINE KIT)  15 mL Mouth Rinse BID  . enoxaparin (LOVENOX) injection  40 mg Subcutaneous Q24H  . feeding supplement (PRO-STAT SUGAR FREE 64)  30 mL Per Tube BID  . feeding supplement (VITAL HIGH PROTEIN)  1,000 mL Per Tube Q24H  . insulin aspart  2-6 Units Subcutaneous Q4H  . insulin glargine  5 Units Subcutaneous QHS  . ipratropium-albuterol  3 mL Nebulization Q6H  . mouth rinse  15 mL Mouth Rinse QID  . methylPREDNISolone (SOLU-MEDROL) injection  80 mg Intravenous Q8H  . nicotine  14 mg Transdermal Daily  . pantoprazole (PROTONIX) IV  40 mg Intravenous Q24H   Continuous Infusions: . sodium chloride 75 mL/hr at 04/18/17 0827  . cefTRIAXone (ROCEPHIN)  IV Stopped (04/18/17 0858)  . propofol (DIPRIVAN) infusion 35 mcg/kg/min (04/18/17 1736)     LOS: 6 days    Critical time spent: 45 minutes. Greater than 50% of this time was spent in direct contact with the patient coordinating care.     Lelon Frohlich, MD [facility] Pager [phone]  If 7PM-7AM, please contact night-coverage [email or web address] Password [facility] 04/18/2017, 5:40 PM

## 2017-04-18 NOTE — Care Management Note (Signed)
Case Management Note  Patient Details  Name: Andrew [name] MRN: [id number] Date of Birth: September 15, 1940   If discussed at Long Length of Stay Meetings, dates discussed:   04/18/2017 Additional Comments:  Janeese Mcgloin, [name], RN 04/18/2017, 10:29 AM

## 2017-04-18 NOTE — Progress Notes (Signed)
Inpatient Diabetes Program Recommendations  AACE/ADA: New Consensus Statement on Inpatient Glycemic Control (2015)  Target Ranges:  Prepandial:   less than 140 mg/dL      Peak postprandial:   less than 180 mg/dL (1-2 hours)      Critically ill patients:  140 - 180 mg/dL   Results for Andrew [name], Andrew [name] (MRN [id number]) as of 04/18/2017 07:29  Ref. Range 04/17/2017 07:57 04/17/2017 11:32 04/17/2017 16:54 04/17/2017 19:33 04/17/2017 23:52 04/18/2017 04:10  Glucose-Capillary Latest Ref Range: 65 - 99 mg/dL 155 (H) 250 (H) 233 (H) 231 (H) 204 (H) 172 (H)   Review of Glycemic Control  Diabetes history: DM2 Outpatient Diabetes medications: Glipizide 5 mg QAM, Glumetza 1000 mg QAM, Onglyza 5 mg daily, Novolog sliding scale Current orders for Inpatient glycemic control: Lantus 5 units QHS, Novolog 2-6 units Q4H  Inpatient Diabetes Program Recommendations: Insulin - Basal: Please consider increasing Lantus to 9 units QHS (based on 87.4 kg x 0.1 units). Insulin - Tube Feeding Coverage: Please consider ordering Novolog 4 units Q4H for tube feeding coverage. If tube feeding is stopped or held then Novolog tube feeding coverage would be stopped or held as well.  Thanks, Barnie Alderman, RN, MSN, CDE Diabetes Coordinator Inpatient Diabetes Program [phone] (Team Pager from 8am to 5pm)

## 2017-04-18 NOTE — Progress Notes (Signed)
Called to come check the ventilator due to excessive alarming. ETT was found to be at 20 at the lips. ETT was advanced to 23 at this time which is where it was previously documented in place. Chest xray will be ordered to verify correct position.

## 2017-04-18 NOTE — Progress Notes (Signed)
Subjective: He remains intubated and on the ventilator. He is improving. We've been able to wean his oxygen. Secretions are less.  Objective: Vital signs in last 24 hours: Temp:  [98.6 F (37 C)-99.5 F (37.5 C)] 99.5 F (37.5 C) (08/14 0700) Pulse Rate:  [73-87] 80 (08/14 0700) Resp:  [17-21] 21 (08/14 0700) BP: (129-160)/(66-83) 129/71 (08/14 0700) SpO2:  [89 %-95 %] 92 % (08/14 0829) FiO2 (%):  [45 %-55 %] 45 % (08/14 0829) Weight:  [87.4 kg (192 lb 10.9 oz)] 87.4 kg (192 lb 10.9 oz) (08/14 0500) Weight change: 1.4 kg (3 lb 1.4 oz) Last BM Date: 04/16/17  Intake/Output from previous day: 08/13 0701 - 08/14 0700 In: 2847.6 [P.O.:170; I.V.:2107.6; NG/GT:520; IV Piggyback:50] Out: 1700 [Urine:1700]  PHYSICAL EXAM General appearance: Intubated, sedated on mechanical ventilation Resp: rhonchi bilaterally Cardio: regular rate and rhythm, S1, S2 normal, no murmur, click, rub or gallop GI: soft, non-tender; bowel sounds normal; no masses,  no organomegaly Extremities: extremities normal, atraumatic, no cyanosis or edema Skin warm and dry  Lab Results:  Results for orders placed or performed during the hospital encounter of 04/12/17 (from the past 48 hour(s))  Glucose, capillary     Status: Abnormal   Collection Time: 04/16/17 11:13 AM  Result Value Ref Range   Glucose-Capillary 168 (H) 65 - 99 mg/dL  Glucose, capillary     Status: Abnormal   Collection Time: 04/16/17  5:04 PM  Result Value Ref Range   Glucose-Capillary 128 (H) 65 - 99 mg/dL  Glucose, capillary     Status: Abnormal   Collection Time: 04/16/17  7:52 PM  Result Value Ref Range   Glucose-Capillary 226 (H) 65 - 99 mg/dL  Glucose, capillary     Status: Abnormal   Collection Time: 04/17/17 12:06 AM  Result Value Ref Range   Glucose-Capillary 205 (H) 65 - 99 mg/dL  Basic metabolic panel     Status: Abnormal   Collection Time: 04/17/17  4:09 AM  Result Value Ref Range   Sodium 145 135 - 145 mmol/L   Potassium  4.2 3.5 - 5.1 mmol/L   Chloride 104 101 - 111 mmol/L   CO2 34 (H) 22 - 32 mmol/L   Glucose, Bld 152 (H) 65 - 99 mg/dL   BUN 30 (H) 6 - 20 mg/dL   Creatinine, Ser 0.69 0.61 - 1.24 mg/dL   Calcium 8.6 (L) 8.9 - 10.3 mg/dL   GFR calc non Af Amer >60 >60 mL/min   GFR calc Af Amer >60 >60 mL/min    Comment: (NOTE) The eGFR has been calculated using the CKD EPI equation. This calculation has not been validated in all clinical situations. eGFR's persistently <60 mL/min signify possible Chronic Kidney Disease.    Anion gap 7 5 - 15  CBC     Status: Abnormal   Collection Time: 04/17/17  4:09 AM  Result Value Ref Range   WBC 10.0 4.0 - 10.5 K/uL   RBC 5.01 4.22 - 5.81 MIL/uL   Hemoglobin 13.0 13.0 - 17.0 g/dL   HCT 41.8 39.0 - 52.0 %   MCV 83.4 78.0 - 100.0 fL   MCH 25.9 (L) 26.0 - 34.0 pg   MCHC 31.1 30.0 - 36.0 g/dL   RDW 17.7 (H) 11.5 - 15.5 %   Platelets 168 150 - 400 K/uL  Triglycerides     Status: Abnormal   Collection Time: 04/17/17  4:09 AM  Result Value Ref Range   Triglycerides 174 (H) <150  mg/dL  Glucose, capillary     Status: Abnormal   Collection Time: 04/17/17  4:22 AM  Result Value Ref Range   Glucose-Capillary 147 (H) 65 - 99 mg/dL  Blood gas, arterial     Status: Abnormal   Collection Time: 04/17/17  5:20 AM  Result Value Ref Range   FIO2 55.00    Delivery systems VENTILATOR    Mode PRESSURE REGULATED VOLUME CONTROL    VT 500 mL   LHR 18 resp/min   Peep/cpap 5.0 cm H20   pH, Arterial 7.424 7.350 - 7.450   pCO2 arterial 56.0 (H) 32.0 - 48.0 mmHg   pO2, Arterial 92.1 83.0 - 108.0 mmHg   Bicarbonate 33.6 (H) 20.0 - 28.0 mmol/L   Acid-Base Excess 11.2 (H) 0.0 - 2.0 mmol/L   O2 Saturation 95.6 %   Collection site RIGHT RADIAL    Drawn by COLLECTED BY RT    Sample type ARTERIAL    Allens test (pass/fail) PASS PASS  Glucose, capillary     Status: Abnormal   Collection Time: 04/17/17  7:57 AM  Result Value Ref Range   Glucose-Capillary 155 (H) 65 - 99 mg/dL   Glucose, capillary     Status: Abnormal   Collection Time: 04/17/17 11:32 AM  Result Value Ref Range   Glucose-Capillary 250 (H) 65 - 99 mg/dL  Glucose, capillary     Status: Abnormal   Collection Time: 04/17/17  4:54 PM  Result Value Ref Range   Glucose-Capillary 233 (H) 65 - 99 mg/dL  Glucose, capillary     Status: Abnormal   Collection Time: 04/17/17  7:33 PM  Result Value Ref Range   Glucose-Capillary 231 (H) 65 - 99 mg/dL   Comment 1 Notify RN   Glucose, capillary     Status: Abnormal   Collection Time: 04/17/17 11:52 PM  Result Value Ref Range   Glucose-Capillary 204 (H) 65 - 99 mg/dL   Comment 1 Notify RN   Basic metabolic panel     Status: Abnormal   Collection Time: 04/18/17  4:01 AM  Result Value Ref Range   Sodium 144 135 - 145 mmol/L   Potassium 4.7 3.5 - 5.1 mmol/L   Chloride 105 101 - 111 mmol/L   CO2 32 22 - 32 mmol/L   Glucose, Bld 177 (H) 65 - 99 mg/dL   BUN 32 (H) 6 - 20 mg/dL   Creatinine, Ser 0.62 0.61 - 1.24 mg/dL   Calcium 8.7 (L) 8.9 - 10.3 mg/dL   GFR calc non Af Amer >60 >60 mL/min   GFR calc Af Amer >60 >60 mL/min    Comment: (NOTE) The eGFR has been calculated using the CKD EPI equation. This calculation has not been validated in all clinical situations. eGFR's persistently <60 mL/min signify possible Chronic Kidney Disease.    Anion gap 7 5 - 15  CBC     Status: Abnormal   Collection Time: 04/18/17  4:01 AM  Result Value Ref Range   WBC 9.9 4.0 - 10.5 K/uL   RBC 5.18 4.22 - 5.81 MIL/uL   Hemoglobin 13.6 13.0 - 17.0 g/dL   HCT 43.1 39.0 - 52.0 %   MCV 83.2 78.0 - 100.0 fL   MCH 26.3 26.0 - 34.0 pg   MCHC 31.6 30.0 - 36.0 g/dL   RDW 17.8 (H) 11.5 - 15.5 %   Platelets 188 150 - 400 K/uL  Glucose, capillary     Status: Abnormal   Collection Time: 04/18/17  4:10 AM  Result Value Ref Range   Glucose-Capillary 172 (H) 65 - 99 mg/dL   Comment 1 Notify RN   Blood gas, arterial     Status: Abnormal   Collection Time: 04/18/17  5:48 AM   Result Value Ref Range   FIO2 45.00    Delivery systems VENTILATOR    Mode PRESSURE REGULATED VOLUME CONTROL    VT 500 mL   LHR 18 resp/min   Peep/cpap 5.0 cm H20   pH, Arterial 7.415 7.350 - 7.450   pCO2 arterial 53.2 (H) 32.0 - 48.0 mmHg   pO2, Arterial 79.5 (L) 83.0 - 108.0 mmHg   Bicarbonate 31.4 (H) 20.0 - 28.0 mmol/L   Acid-Base Excess 8.7 (H) 0.0 - 2.0 mmol/L   O2 Saturation 94.0 %   Patient temperature 37.0    Collection site RIGHT RADIAL    Drawn by [id number]    Sample type ARTERIAL DRAW    Allens test (pass/fail) PASS PASS  Glucose, capillary     Status: Abnormal   Collection Time: 04/18/17  7:36 AM  Result Value Ref Range   Glucose-Capillary 186 (H) 65 - 99 mg/dL    ABGS  Recent Labs  04/18/17 0548  PHART 7.415  PO2ART 79.5*  HCO3 31.4*   CULTURES Recent Results (from the past 240 hour(s))  MRSA PCR Screening     Status: None   Collection Time: 04/12/17  3:06 PM  Result Value Ref Range Status   MRSA by PCR NEGATIVE NEGATIVE Final    Comment:        The GeneXpert MRSA Assay (FDA approved for NASAL specimens only), is one component of a comprehensive MRSA colonization surveillance program. It is not intended to diagnose MRSA infection nor to guide or monitor treatment for MRSA infections.   Culture, respiratory (NON-Expectorated)     Status: None (Preliminary result)   Collection Time: 04/14/17  7:49 AM  Result Value Ref Range Status   Specimen Description TRACHEAL ASPIRATE  Final   Special Requests NONE  Final   Gram Stain   Final    ABUNDANT WBC PRESENT, PREDOMINANTLY PMN RARE SQUAMOUS EPITHELIAL CELLS PRESENT ABUNDANT GRAM POSITIVE RODS    Culture   Final    CULTURE REINCUBATED FOR BETTER GROWTH Performed at [facility], [place] [address], [place], [place] [address]    Report Status PENDING  Incomplete   Studies/Results: Dg Chest Port 1 View  Result Date: 04/18/2017 CLINICAL DATA:  Acute respiratory failure with hypoxemia. EXAM:  PORTABLE CHEST 1 VIEW COMPARISON:  Radiograph of April 17, 2017. FINDINGS: Stable cardiomediastinal silhouette. Endotracheal and nasogastric tubes are unchanged in position. No pneumothorax or significant pleural effusion is noted. Stable mild central pulmonary vascular congestion is noted. Bony thorax is unremarkable. Mild bibasilar subsegmental atelectasis is noted. IMPRESSION: Stable support apparatus. Stable mild central pulmonary vascular congestion. Mild bibasilar subsegmental atelectasis. Electronically Signed   By: Marijo Conception, M.D.   On: 04/18/2017 07:34   Dg Chest Port 1 View  Result Date: 04/17/2017 CLINICAL DATA:  Respiratory failure EXAM: PORTABLE CHEST 1 VIEW COMPARISON:  Chest radiograph from one day prior. FINDINGS: Endotracheal tube tip is 3.9 cm above the carina. Enteric tube enters stomach with the tip not seen on this image. Stable cardiomediastinal silhouette with normal heart size. No pneumothorax. No pleural effusion. Patchy mild left lung base opacity is not appreciably changed. Cephalization of the pulmonary vasculature without overt pulmonary edema. IMPRESSION: 1. Well-positioned support structures. 2. Stable mild  patchy left lung base opacity. Electronically Signed   By: Ilona Sorrel M.D.   On: 04/17/2017 07:31   Dg Chest Port 1 View  Result Date: 04/16/2017 CLINICAL DATA:  Check nasogastric catheter placement EXAM: PORTABLE CHEST 1 VIEW COMPARISON:  04/16/2017 FINDINGS: Endotracheal tube and nasogastric catheter are seen. The nasogastric catheter extends into the stomach. The lungs are well aerated bilaterally. Mild left basilar atelectasis is again seen. IMPRESSION: Nasogastric catheter within the stomach. Electronically Signed   By: Inez Catalina M.D.   On: 04/16/2017 12:25   Dg Chest Port 1 View  Result Date: 04/16/2017 CLINICAL DATA:  Respiratory failure EXAM: PORTABLE CHEST 1 VIEW COMPARISON:  04/15/2017 FINDINGS: Cardiac shadow is mildly enlarged but stable.  Endotracheal tube is noted in satisfactory position. The nasogastric catheter has been removed in the interval. Lungs are well aerated bilaterally. Mild left basilar atelectasis is seen. No acute bony abnormality is noted. IMPRESSION: Nasogastric catheter appears to have been removed. Left basilar atelectasis. Electronically Signed   By: Inez Catalina M.D.   On: 04/16/2017 09:42    Medications:  Prior to Admission:  Prescriptions Prior to Admission  Medication Sig Dispense Refill Last Dose  . albuterol (PROVENTIL HFA;VENTOLIN HFA) 108 (90 BASE) MCG/ACT inhaler Inhale 2 puffs into the lungs every 4 (four) hours as needed for wheezing.    Taking  . albuterol (PROVENTIL) (2.5 MG/3ML) 0.083% nebulizer solution Take 2.5 mg by nebulization every 4 (four) hours as needed for wheezing or shortness of breath.   Taking  . aspirin 325 MG tablet Take 325 mg by mouth daily.   04/11/2017 at Unknown time  . Cyanocobalamin (VITAMIN B-12) 2500 MCG SUBL Place 2,500 mcg under the tongue daily.   04/11/2017 at Unknown time  . cyclobenzaprine (FLEXERIL) 10 MG tablet Take 5 mg by mouth at bedtime.   04/11/2017 at Unknown time  . diltiazem (TIAZAC) 300 MG 24 hr capsule Take 300 mg by mouth daily.    04/11/2017 at Unknown time  . diphenhydrAMINE (BENADRYL) 25 mg capsule Take 25 mg by mouth at bedtime as needed.   04/11/2017 at Unknown time  . furosemide (LASIX) 20 MG tablet Take 40 mg by mouth daily.    04/11/2017 at Unknown time  . gabapentin (NEURONTIN) 400 MG capsule Take 400 mg by mouth 4 (four) times daily.   04/11/2017 at Unknown time  . glipiZIDE (GLUCOTROL) 5 MG tablet Take 5 mg by mouth daily before breakfast.   04/11/2017 at Unknown time  . ipratropium-albuterol (DUONEB) 0.5-2.5 (3) MG/3ML SOLN Take 3 mLs by nebulization QID.    04/11/2017 at Unknown time  . metoCLOPramide (REGLAN) 10 MG tablet Take 10 mg by mouth 4 (four) times daily.   04/11/2017 at Unknown time  . montelukast (SINGULAIR) 10 MG tablet Take 10 mg by mouth daily.    04/11/2017 at Unknown time  . omeprazole (PRILOSEC) 40 MG capsule Take 40 mg by mouth daily.   04/11/2017 at Unknown time  . rosuvastatin (CRESTOR) 20 MG tablet Take 1 tablet (20 mg total) by mouth daily. 30 tablet 3 04/11/2017 at Unknown time  . saxagliptin HCl (ONGLYZA) 5 MG TABS tablet Take 5 mg by mouth daily.   04/11/2017 at Unknown time  . tiZANidine (ZANAFLEX) 4 MG tablet Take 4 mg by mouth 3 (three) times daily.   04/11/2017 at Unknown time  . umeclidinium-vilanterol (ANORO ELLIPTA) 62.5-25 MCG/INH AEPB Inhale 1 puff into the lungs daily.   04/11/2017 at Unknown time  . cefdinir (  OMNICEF) 300 MG capsule Take 300 mg by mouth 2 (two) times daily.   Completed Course at Unknown time  . insulin aspart (NOVOLOG) 100 UNIT/ML injection Per sliding scale   Taking  . metFORMIN (GLUMETZA) 1000 MG (MOD) 24 hr tablet Take 1,000 mg by mouth daily with breakfast.   Not Taking at Unknown time   Scheduled: . aspirin  300 mg Rectal Daily  . chlorhexidine gluconate (MEDLINE KIT)  15 mL Mouth Rinse BID  . enoxaparin (LOVENOX) injection  40 mg Subcutaneous Q24H  . feeding supplement (PRO-STAT SUGAR FREE 64)  30 mL Per Tube BID  . feeding supplement (VITAL HIGH PROTEIN)  1,000 mL Per Tube Q24H  . insulin aspart  2-6 Units Subcutaneous Q4H  . insulin glargine  5 Units Subcutaneous QHS  . ipratropium-albuterol  3 mL Nebulization Q6H  . mouth rinse  15 mL Mouth Rinse QID  . methylPREDNISolone (SOLU-MEDROL) injection  80 mg Intravenous Q8H  . nicotine  14 mg Transdermal Daily  . pantoprazole (PROTONIX) IV  40 mg Intravenous Q24H   Continuous: . sodium chloride 75 mL/hr at 04/18/17 0827  . cefTRIAXone (ROCEPHIN)  IV 1 g (04/18/17 0827)  . propofol (DIPRIVAN) infusion 40 mcg/kg/min (04/18/17 0500)   JNG:[id number] **OR** acetaminophen, albuterol, hydrALAZINE, ondansetron **OR** ondansetron (ZOFRAN) IV, senna-docusate  Assesment: He was admitted with acute hypoxic and hypercapnic respiratory failure. He has COPD  with acute exacerbation. He has community-acquired pneumonia. Chest x-ray which I personally reviewed looks much better today. He is now on 45% oxygen. Secretions are less Principal Problem:   Acute respiratory failure with hypoxia and hypercapnia (HCC) Active Problems:   Diabetes mellitus ([place])   Hyperlipidemia   TOBACCO ABUSE   Essential hypertension   ATRIAL FIBRILLATION   CHRONIC OBSTRUCTIVE PULMONARY DISEASE, ACUTE EXACERBATION    Plan: Attempt weaning today    LOS: 6 days   Carely Nappier L 04/18/2017, 9:02 AM

## 2017-04-18 NOTE — Plan of Care (Signed)
Problem: Nutrition: Goal: Adequate nutrition will be maintained Outcome: Progressing Patient is tolerating tube feeding well per residual amounts of only 15cc this AM

## 2017-04-19 ENCOUNTER — Inpatient Hospital Stay (HOSPITAL_COMMUNITY): Payer: Medicare PPO

## 2017-04-19 DIAGNOSIS — J9601 Acute respiratory failure with hypoxia: Secondary | ICD-10-CM

## 2017-04-19 DIAGNOSIS — J9602 Acute respiratory failure with hypercapnia: Secondary | ICD-10-CM

## 2017-04-19 DIAGNOSIS — I4891 Unspecified atrial fibrillation: Secondary | ICD-10-CM

## 2017-04-19 LAB — BLOOD GAS, ARTERIAL
Acid-Base Excess: 6.8 mmol/L — ABNORMAL HIGH (ref 0.0–2.0)
BICARBONATE: 29.8 mmol/L — AB (ref 20.0–28.0)
Drawn by: 382351
FIO2: 45
LHR: 18 {breaths}/min
O2 Saturation: 95.5 %
PEEP: 5 cmH2O
PO2 ART: 89 mmHg (ref 83.0–108.0)
Patient temperature: 37
VT: 500 mL
pCO2 arterial: 50 mmHg — ABNORMAL HIGH (ref 32.0–48.0)
pH, Arterial: 7.414 (ref 7.350–7.450)

## 2017-04-19 LAB — GLUCOSE, CAPILLARY
GLUCOSE-CAPILLARY: 132 mg/dL — AB (ref 65–99)
GLUCOSE-CAPILLARY: 198 mg/dL — AB (ref 65–99)
GLUCOSE-CAPILLARY: 220 mg/dL — AB (ref 65–99)
Glucose-Capillary: 196 mg/dL — ABNORMAL HIGH (ref 65–99)
Glucose-Capillary: 172 mg/dL — ABNORMAL HIGH (ref 65–99)
Glucose-Capillary: 217 mg/dL — ABNORMAL HIGH (ref 65–99)

## 2017-04-19 LAB — CREATININE, SERUM
CREATININE: 0.6 mg/dL — AB (ref 0.61–1.24)
GFR calc Af Amer: >60 mL/min (ref >60)

## 2017-04-19 LAB — TRIGLYCERIDES: Triglycerides: 156 mg/dL — ABNORMAL HIGH (ref <150)

## 2017-04-19 MED ORDER — FUROSEMIDE 10 MG/ML IJ SOLN
40.0000 mg | Freq: Once | INTRAMUSCULAR | Status: AC
  Administered 2017-04-19: 40 mg via INTRAVENOUS
  Filled 2017-04-19: qty 4

## 2017-04-19 NOTE — Progress Notes (Signed)
Inpatient Diabetes Program Recommendations  AACE/ADA: New Consensus Statement on Inpatient Glycemic Control (2015)  Target Ranges:  Prepandial:   less than 140 mg/dL      Peak postprandial:   less than 180 mg/dL (1-2 hours)      Critically ill patients:  140 - 180 mg/dL   Lab Results  Component Value Date   GLUCAP 196 (H) 04/19/2017   HGBA1C 8.3 (H) 04/12/2017    Review of Glycemic ControlResults for KALAB, CAMPS (MRN [id number]) as of 04/19/2017 13:17  Ref. Range 04/18/2017 16:24 04/18/2017 20:00 04/19/2017 00:12 04/19/2017 04:00 04/19/2017 07:29  Glucose-Capillary Latest Ref Range: 65 - 99 mg/dL 166 (H) 225 (H) 198 (H) 132 (H) 196 (H)   Diabetes history: Type 2 diabetes Outpatient Diabetes medications: Glucotrol 5 mg daily, Metformin 1000 mg daily, Onglyza 5 mg daily Current orders for Inpatient glycemic control:  Novolog 2-4-6 q 4 hours, Lantus 5 units daily, Solumedrol 80 mg IV q 8 hours  Inpatient Diabetes Program Recommendations: Consider increasing Lantus to 10 units daily.   Thanks, Adah Perl, RN, BC-ADM Inpatient Diabetes Coordinator Pager [phone] (8a-5p)

## 2017-04-19 NOTE — Progress Notes (Signed)
PROGRESS NOTE    Andrew [name]  BWI:[id number] DOB: 1941-06-23 DOA: 04/12/2017 PCP: System, Provider Not In     Brief Narrative:  76 y/o man admitted to the hospital on 8/8 due to shortness of breath and unresponsiveness presumed due to COPD with acute exacerbation. He required BiPAP upon admission and unfortunately he failed BiPAP and was intubated on the night of 8/9. Secretions continue to be an issue, however chest x-ray do not show any change in his infiltrate, in fact there may be improvement. On 8/13 has been weaning on the ventilator.  Assessment & Plan:   Principal Problem:   Acute respiratory failure with hypoxia and hypercapnia (HCC) Active Problems:   Diabetes mellitus (HCC)   Hyperlipidemia   TOBACCO ABUSE   Essential hypertension   ATRIAL FIBRILLATION   CHRONIC OBSTRUCTIVE PULMONARY DISEASE, ACUTE EXACERBATION   Acute hypoxic and hypercapnic respiratory failure  -Patient failed noninvasive therapy and was intubated on 8/9. -Appreciate Dr. Luan Pulling input and recommendations regarding ventilator management -Is improving, FiO2 STILL AT 45., Chest x-rays showing vascular congestion, stop IV fluids , 1 dose of Lasix. -Continue empirical coverage with Rocephin.   COPD with acute exacerbation -Continue steroids, nebs at current dose. -See above for more details.  Tobacco abuse -Will need to discuss cessation once  extubated.  Atrial fibrillation -Rate controlled, not chronically anticoagulated, continue aspirin.  Type 2 diabetes -On ICU hyperglycemia protocol with 5 units of basal Lantus. -Improved glycemic control.  Hypertension -Fair control, when necessary hydralazine -Resume oral meds once more alert.  GERD -Continue PPI.  Hyperlipidemia -Resume statin once more alert and extubated.   DVT prophylaxis: lovenox Code Status: full code Family Communication: patient only  Disposition Plan: To be determined pending medical stability  Consultants:    Pulmonary, Dr. Luan Pulling  Procedures:   ECHO as above  Antimicrobials:  Anti-infectives    Start     Dose/Rate Route Frequency Ordered Stop   04/13/17 0745  cefTRIAXone (ROCEPHIN) 1 g in dextrose 5 % 50 mL IVPB     1 g 100 mL/hr over 30 Minutes Intravenous Every 24 hours 04/13/17 0743     04/12/17 1915  azithromycin (ZITHROMAX) 500 mg in dextrose 5 % 250 mL IVPB     500 mg 250 mL/hr over 60 Minutes Intravenous Daily-1800 04/12/17 1908 04/16/17 1809       Subjective: Intubated, sedated  Objective: Vitals:   04/19/17 0400 04/19/17 0500 04/19/17 0600 04/19/17 0736  BP: 139/67 130/69 (!) 160/88   Pulse: 71 71 70 65  Resp: _0 Temp: 98.6 F (37 C) 98.6 F (37 C) 98.6 F (37 C)   TempSrc: Core (Comment) Core (Comment) Core (Comment)   SpO2: 93% 93% 93% 91%  Weight:  88 kg (194 lb 0.1 oz)    Height:        Intake/Output Summary (Last 24 hours) at 04/19/17 0951 Last data filed at 04/19/17 0600  Gross per 24 hour  Intake          3527.97 ml  Output             2000 ml  Net          1527.97 ml   Filed Weights   04/17/17 0500 04/18/17 0500 04/19/17 0500  Weight: 86 kg (189 lb 9.5 oz) 87.4 kg (192 lb 10.9 oz) 88 kg (194 lb 0.1 oz)    Examination:  General exam: Intubated, sedated Respiratory system: Minimal expiratory wheezes Cardiovascular system:RRR.  No murmurs, rubs, gallops. Gastrointestinal system: Abdomen is nondistended, soft and nontender. No organomegaly or masses felt. Normal bowel sounds heard. Central nervous system: Unable to assess given current level of sedation Extremities: No C/C/E, +pedal pulses Skin: No rashes, lesions or ulcers Psychiatry: Unable to assess given current level of sedation          Data Reviewed: I have personally reviewed following labs and imaging studies  CBC:  Recent Labs Lab 04/12/17 1200 04/13/17 0605 04/15/17 0445 04/16/17 0429 04/17/17 0409 04/18/17 0401  WBC 9.2 11.6* 9.4 7.6 10.0 9.9   NEUTROABS 7.2  --   --   --   --   --   HGB 13.6 14.5 12.8* 12.9* 13.0 13.6  HCT 44.1 46.8 40.5 41.7 41.8 43.1  MCV 85.5 85.4 83.0 84.4 83.4 83.2  PLT 216 219 225 198 168 427   Basic Metabolic Panel:  Recent Labs Lab 04/13/17 0605 04/14/17 0446 04/14/17 1642 04/15/17 0445 04/15/17 1631 04/16/17 0429 04/17/17 0409 04/18/17 0401 04/19/17 0521  NA 142  --   --  143  --  147* 145 144  --   K 4.4  --   --  3.6  --  4.4 4.2 4.7  --   CL 99*  --   --  99*  --  104 104 105  --   CO2 35*  --   --  37*  --  36* 34* 32  --   GLUCOSE 214*  --   --  173*  --  229* 152* 177*  --   BUN 22*  --   --  36*  --  33* 30* 32*  --   CREATININE 0.67  --   --  0.76  --  0.74 0.69 0.62 0.60*  CALCIUM 9.0  --   --  8.7*  --  8.5* 8.6* 8.7*  --   MG  --  2.3 2.3 2.2 2.2  --   --   --   --   PHOS  --  1.9* 2.7 2.9 3.4  --   --   --   --    GFR: Estimated Creatinine Clearance: 81.1 mL/min (A) (by C-G formula based on SCr of 0.6 mg/dL (L)). Liver Function Tests:  Recent Labs Lab 04/12/17 1200  AST 28  ALT 40  ALKPHOS 87  BILITOT 0.7  PROT 6.9  ALBUMIN 3.1*   No results for input(s): LIPASE, AMYLASE in the last 168 hours. No results for input(s): AMMONIA in the last 168 hours. Coagulation Profile: No results for input(s): INR, PROTIME in the last 168 hours. Cardiac Enzymes:  Recent Labs Lab 04/12/17 1200  TROPONINI 0.03*   BNP (last 3 results) No results for input(s): PROBNP in the last 8760 hours. HbA1C: No results for input(s): HGBA1C in the last 72 hours. CBG:  Recent Labs Lab 04/18/17 1624 04/18/17 2000 04/19/17 0012 04/19/17 0400 04/19/17 0729  GLUCAP 166* 225* 198* 132* 196*   Lipid Profile:  Recent Labs  04/17/17 0409  TRIG 174*   Thyroid Function Tests: No results for input(s): TSH, T4TOTAL, FREET4, T3FREE, THYROIDAB in the last 72 hours. Anemia Panel: No results for input(s): VITAMINB12, FOLATE, FERRITIN, TIBC, IRON, RETICCTPCT in the last 72 hours. Urine  analysis:    Component Value Date/Time   COLORURINE AMBER (A) 05/05/2013 1621   APPEARANCEUR CLEAR 05/05/2013 1621   LABSPEC 1.025 05/05/2013 1621   PHURINE 5.5 05/05/2013 1621   GLUCOSEU NEGATIVE 05/05/2013 1621  HGBUR NEGATIVE 05/05/2013 1621   HGBUR negative 03/31/2009 1514   BILIRUBINUR SMALL (A) 05/05/2013 1621   KETONESUR NEGATIVE 05/05/2013 1621   PROTEINUR NEGATIVE 05/05/2013 1621   UROBILINOGEN 1.0 05/05/2013 1621   NITRITE NEGATIVE 05/05/2013 1621   LEUKOCYTESUR NEGATIVE 05/05/2013 1621   Sepsis Labs: _0 (procalcitonin:4,lacticidven:4)  ) Recent Results (from the past 240 hour(s))  MRSA PCR Screening     Status: None   Collection Time: 04/12/17  3:06 PM  Result Value Ref Range Status   MRSA by PCR NEGATIVE NEGATIVE Final    Comment:        The GeneXpert MRSA Assay (FDA approved for NASAL specimens only), is one component of a comprehensive MRSA colonization surveillance program. It is not intended to diagnose MRSA infection nor to guide or monitor treatment for MRSA infections.   Culture, respiratory (NON-Expectorated)     Status: None   Collection Time: 04/14/17  7:49 AM  Result Value Ref Range Status   Specimen Description TRACHEAL ASPIRATE  Final   Special Requests NONE  Final   Gram Stain   Final    ABUNDANT WBC PRESENT, PREDOMINANTLY PMN RARE SQUAMOUS EPITHELIAL CELLS PRESENT ABUNDANT GRAM POSITIVE RODS Performed at [facility], [address] [place], [place] [address]    Culture   Final    ABUNDANT CORYNEBACTERIUM STRIATUM MODERATE ENTEROBACTER SPECIES    Report Status 04/18/2017 FINAL  Final   Organism ID, Bacteria ENTEROBACTER SPECIES  Final      Susceptibility   Enterobacter species - MIC*    CEFAZOLIN >=64 RESISTANT Resistant     CEFEPIME <=1 SENSITIVE Sensitive     CEFTAZIDIME <=1 SENSITIVE Sensitive     CEFTRIAXONE <=1 SENSITIVE Sensitive     CIPROFLOXACIN <=0.25 SENSITIVE Sensitive     GENTAMICIN <=1 SENSITIVE  Sensitive     IMIPENEM <=0.25 SENSITIVE Sensitive     TRIMETH/SULFA <=20 SENSITIVE Sensitive     PIP/TAZO <=4 SENSITIVE Sensitive     * MODERATE ENTEROBACTER SPECIES         Radiology Studies: Dg Chest Port 1 View  Result Date: 04/18/2017 CLINICAL DATA:  Endotracheal tube placement. Respiratory failure. Unresponsive on admission. EXAM: PORTABLE CHEST 1 VIEW COMPARISON:  Earlier the same date and 04/17/2017. FINDINGS: 1645 hour. The tip of the endotracheal tube is in the mid trachea. Nasogastric tube projects below the diaphragm. A line loops over the lower left chest, probably separate from the nasogastric tube, although incompletely visualized. The heart size and mediastinal contours are stable. There are stable bibasilar pulmonary opacities, most consistent with atelectasis. No pneumothorax or significant pleural effusion. IMPRESSION: No significant changes are seen. Patchy bibasilar opacities are stable, likely atelectasis. Endotracheal tube appears satisfactorily positioned. Line projecting over the heart shadow is probably separate from the adjacent nasogastric tube, although incompletely visualized. Electronically Signed   By: Richardean Sale M.D.   On: 04/18/2017 17:15   Dg Chest Port 1 View  Result Date: 04/18/2017 CLINICAL DATA:  Acute respiratory failure with hypoxemia. EXAM: PORTABLE CHEST 1 VIEW COMPARISON:  Radiograph of April 17, 2017. FINDINGS: Stable cardiomediastinal silhouette. Endotracheal and nasogastric tubes are unchanged in position. No pneumothorax or significant pleural effusion is noted. Stable mild central pulmonary vascular congestion is noted. Bony thorax is unremarkable. Mild bibasilar subsegmental atelectasis is noted. IMPRESSION: Stable support apparatus. Stable mild central pulmonary vascular congestion. Mild bibasilar subsegmental atelectasis. Electronically Signed   By: Marijo Conception, M.D.   On: 04/18/2017 07:34  Scheduled Meds: . aspirin  300 mg  Rectal Daily  . chlorhexidine gluconate (MEDLINE KIT)  15 mL Mouth Rinse BID  . enoxaparin (LOVENOX) injection  40 mg Subcutaneous Q24H  . feeding supplement (PRO-STAT SUGAR FREE 64)  30 mL Per Tube BID  . feeding supplement (VITAL HIGH PROTEIN)  1,000 mL Per Tube Q24H  . insulin aspart  2-6 Units Subcutaneous Q4H  . insulin glargine  5 Units Subcutaneous QHS  . ipratropium-albuterol  3 mL Nebulization Q6H  . mouth rinse  15 mL Mouth Rinse QID  . methylPREDNISolone (SOLU-MEDROL) injection  80 mg Intravenous Q8H  . nicotine  14 mg Transdermal Daily  . pantoprazole (PROTONIX) IV  40 mg Intravenous Q24H   Continuous Infusions: . cefTRIAXone (ROCEPHIN)  IV Stopped (04/19/17 0840)  . propofol (DIPRIVAN) infusion 35 mcg/kg/min (04/19/17 0600)     LOS: 7 days    Critical time spent: 45 minutes. Greater than 50% of this time was spent in direct contact with the patient coordinating care.     Waldron Session, MD [facility] Pager [phone]  If 7PM-7AM, please contact night-coverage [email or web address] Password [facility] 04/19/2017, 9:51 AM

## 2017-04-19 NOTE — Progress Notes (Signed)
Subjective: He remains intubated and on the ventilator. His oxygen saturations in the low 90s on 45% and with ventilator support. He was not able to wean yesterday. His endotracheal tube had to be adjusted yesterday.  Objective: Vital signs in last 24 hours: Temp:  [98.4 F (36.9 C)-99.3 F (37.4 C)] 98.6 F (37 C) (08/15 0600) Pulse Rate:  [54-81] 65 (08/15 0736) Resp:  [18-21] 19 (08/15 0736) BP: (124-160)/(61-88) 160/88 (08/15 0600) SpO2:  [90 %-94 %] 91 % (08/15 0736) FiO2 (%):  [45 %] 45 % (08/15 0736) Weight:  [88 kg (194 lb 0.1 oz)] 88 kg (194 lb 0.1 oz) (08/15 0500) Weight change: 0.6 kg (1 lb 5.2 oz) Last BM Date: 04/16/17  Intake/Output from previous day: 08/14 0701 - 08/15 0700 In: 3528 [I.V.:2158; NG/GT:1320; IV Piggyback:50] Out: 2000 [Urine:2000]  PHYSICAL EXAM General appearance: Intubated sedated on mechanical ventilation Resp: rhonchi bilaterally Cardio: regular rate and rhythm, S1, S2 normal, no murmur, click, rub or gallop GI: soft, non-tender; bowel sounds normal; no masses,  no organomegaly Extremities: extremities normal, atraumatic, no cyanosis or edema Skin warm and dry  Lab Results:  Results for orders placed or performed during the hospital encounter of 04/12/17 (from the past 48 hour(s))  Glucose, capillary     Status: Abnormal   Collection Time: 04/17/17 11:32 AM  Result Value Ref Range   Glucose-Capillary 250 (H) 65 - 99 mg/dL  Glucose, capillary     Status: Abnormal   Collection Time: 04/17/17  4:54 PM  Result Value Ref Range   Glucose-Capillary 233 (H) 65 - 99 mg/dL  Glucose, capillary     Status: Abnormal   Collection Time: 04/17/17  7:33 PM  Result Value Ref Range   Glucose-Capillary 231 (H) 65 - 99 mg/dL   Comment 1 Notify RN   Glucose, capillary     Status: Abnormal   Collection Time: 04/17/17 11:52 PM  Result Value Ref Range   Glucose-Capillary 204 (H) 65 - 99 mg/dL   Comment 1 Notify RN   Basic metabolic panel     Status:  Abnormal   Collection Time: 04/18/17  4:01 AM  Result Value Ref Range   Sodium 144 135 - 145 mmol/L   Potassium 4.7 3.5 - 5.1 mmol/L   Chloride 105 101 - 111 mmol/L   CO2 32 22 - 32 mmol/L   Glucose, Bld 177 (H) 65 - 99 mg/dL   BUN 32 (H) 6 - 20 mg/dL   Creatinine, Ser 0.62 0.61 - 1.24 mg/dL   Calcium 8.7 (L) 8.9 - 10.3 mg/dL   GFR calc non Af Amer >60 >60 mL/min   GFR calc Af Amer >60 >60 mL/min    Comment: (NOTE) The eGFR has been calculated using the CKD EPI equation. This calculation has not been validated in all clinical situations. eGFR's persistently <60 mL/min signify possible Chronic Kidney Disease.    Anion gap 7 5 - 15  CBC     Status: Abnormal   Collection Time: 04/18/17  4:01 AM  Result Value Ref Range   WBC 9.9 4.0 - 10.5 K/uL   RBC 5.18 4.22 - 5.81 MIL/uL   Hemoglobin 13.6 13.0 - 17.0 g/dL   HCT 43.1 39.0 - 52.0 %   MCV 83.2 78.0 - 100.0 fL   MCH 26.3 26.0 - 34.0 pg   MCHC 31.6 30.0 - 36.0 g/dL   RDW 17.8 (H) 11.5 - 15.5 %   Platelets 188 150 - 400 K/uL  Glucose,  capillary     Status: Abnormal   Collection Time: 04/18/17  4:10 AM  Result Value Ref Range   Glucose-Capillary 172 (H) 65 - 99 mg/dL   Comment 1 Notify RN   Blood gas, arterial     Status: Abnormal   Collection Time: 04/18/17  5:48 AM  Result Value Ref Range   FIO2 45.00    Delivery systems VENTILATOR    Mode PRESSURE REGULATED VOLUME CONTROL    VT 500 mL   LHR 18 resp/min   Peep/cpap 5.0 cm H20   pH, Arterial 7.415 7.350 - 7.450   pCO2 arterial 53.2 (H) 32.0 - 48.0 mmHg   pO2, Arterial 79.5 (L) 83.0 - 108.0 mmHg   Bicarbonate 31.4 (H) 20.0 - 28.0 mmol/L   Acid-Base Excess 8.7 (H) 0.0 - 2.0 mmol/L   O2 Saturation 94.0 %   Patient temperature 37.0    Collection site RIGHT RADIAL    Drawn by [id number]    Sample type ARTERIAL DRAW    Allens test (pass/fail) PASS PASS  Glucose, capillary     Status: Abnormal   Collection Time: 04/18/17  7:36 AM  Result Value Ref Range   Glucose-Capillary  186 (H) 65 - 99 mg/dL  Glucose, capillary     Status: Abnormal   Collection Time: 04/18/17 11:48 AM  Result Value Ref Range   Glucose-Capillary 235 (H) 65 - 99 mg/dL  Glucose, capillary     Status: Abnormal   Collection Time: 04/18/17  4:24 PM  Result Value Ref Range   Glucose-Capillary 166 (H) 65 - 99 mg/dL  Glucose, capillary     Status: Abnormal   Collection Time: 04/18/17  8:00 PM  Result Value Ref Range   Glucose-Capillary 225 (H) 65 - 99 mg/dL   Comment 1 Notify RN   Glucose, capillary     Status: Abnormal   Collection Time: 04/19/17 12:12 AM  Result Value Ref Range   Glucose-Capillary 198 (H) 65 - 99 mg/dL   Comment 1 Notify RN   Glucose, capillary     Status: Abnormal   Collection Time: 04/19/17  4:00 AM  Result Value Ref Range   Glucose-Capillary 132 (H) 65 - 99 mg/dL   Comment 1 Notify RN   Creatinine, serum     Status: Abnormal   Collection Time: 04/19/17  5:21 AM  Result Value Ref Range   Creatinine, Ser 0.60 (L) 0.61 - 1.24 mg/dL   GFR calc non Af Amer >60 >60 mL/min   GFR calc Af Amer >60 >60 mL/min    Comment: (NOTE) The eGFR has been calculated using the CKD EPI equation. This calculation has not been validated in all clinical situations. eGFR's persistently <60 mL/min signify possible Chronic Kidney Disease.   Blood gas, arterial     Status: Abnormal   Collection Time: 04/19/17  6:09 AM  Result Value Ref Range   FIO2 45.00    Delivery systems VENTILATOR    Mode PRESSURE REGULATED VOLUME CONTROL    VT 500 mL   LHR 18 resp/min   Peep/cpap 5.0 cm H20   pH, Arterial 7.414 7.350 - 7.450   pCO2 arterial 50.0 (H) 32.0 - 48.0 mmHg   pO2, Arterial 89.0 83.0 - 108.0 mmHg   Bicarbonate 29.8 (H) 20.0 - 28.0 mmol/L   Acid-Base Excess 6.8 (H) 0.0 - 2.0 mmol/L   O2 Saturation 95.5 %   Patient temperature 37.0    Collection site RIGHT RADIAL    Drawn by [id number]  Sample type ARTERIAL DRAW    Allens test (pass/fail) PASS PASS  Glucose, capillary     Status:  Abnormal   Collection Time: 04/19/17  7:29 AM  Result Value Ref Range   Glucose-Capillary 196 (H) 65 - 99 mg/dL    ABGS  Recent Labs  04/19/17 0609  PHART 7.414  PO2ART 89.0  HCO3 29.8*   CULTURES Recent Results (from the past 240 hour(s))  MRSA PCR Screening     Status: None   Collection Time: 04/12/17  3:06 PM  Result Value Ref Range Status   MRSA by PCR NEGATIVE NEGATIVE Final    Comment:        The GeneXpert MRSA Assay (FDA approved for NASAL specimens only), is one component of a comprehensive MRSA colonization surveillance program. It is not intended to diagnose MRSA infection nor to guide or monitor treatment for MRSA infections.   Culture, respiratory (NON-Expectorated)     Status: None   Collection Time: 04/14/17  7:49 AM  Result Value Ref Range Status   Specimen Description TRACHEAL ASPIRATE  Final   Special Requests NONE  Final   Gram Stain   Final    ABUNDANT WBC PRESENT, PREDOMINANTLY PMN RARE SQUAMOUS EPITHELIAL CELLS PRESENT ABUNDANT GRAM POSITIVE RODS Performed at [facility], [address] [place], [place] [address]    Culture   Final    ABUNDANT CORYNEBACTERIUM STRIATUM MODERATE ENTEROBACTER SPECIES    Report Status 04/18/2017 FINAL  Final   Organism ID, Bacteria ENTEROBACTER SPECIES  Final      Susceptibility   Enterobacter species - MIC*    CEFAZOLIN >=64 RESISTANT Resistant     CEFEPIME <=1 SENSITIVE Sensitive     CEFTAZIDIME <=1 SENSITIVE Sensitive     CEFTRIAXONE <=1 SENSITIVE Sensitive     CIPROFLOXACIN <=0.25 SENSITIVE Sensitive     GENTAMICIN <=1 SENSITIVE Sensitive     IMIPENEM <=0.25 SENSITIVE Sensitive     TRIMETH/SULFA <=20 SENSITIVE Sensitive     PIP/TAZO <=4 SENSITIVE Sensitive     * MODERATE ENTEROBACTER SPECIES   Studies/Results: Dg Chest Port 1 View  Result Date: 04/18/2017 CLINICAL DATA:  Endotracheal tube placement. Respiratory failure. Unresponsive on admission. EXAM: PORTABLE CHEST 1 VIEW COMPARISON:   Earlier the same date and 04/17/2017. FINDINGS: 1645 hour. The tip of the endotracheal tube is in the mid trachea. Nasogastric tube projects below the diaphragm. A line loops over the lower left chest, probably separate from the nasogastric tube, although incompletely visualized. The heart size and mediastinal contours are stable. There are stable bibasilar pulmonary opacities, most consistent with atelectasis. No pneumothorax or significant pleural effusion. IMPRESSION: No significant changes are seen. Patchy bibasilar opacities are stable, likely atelectasis. Endotracheal tube appears satisfactorily positioned. Line projecting over the heart shadow is probably separate from the adjacent nasogastric tube, although incompletely visualized. Electronically Signed   By: Richardean Sale M.D.   On: 04/18/2017 17:15   Dg Chest Port 1 View  Result Date: 04/18/2017 CLINICAL DATA:  Acute respiratory failure with hypoxemia. EXAM: PORTABLE CHEST 1 VIEW COMPARISON:  Radiograph of April 17, 2017. FINDINGS: Stable cardiomediastinal silhouette. Endotracheal and nasogastric tubes are unchanged in position. No pneumothorax or significant pleural effusion is noted. Stable mild central pulmonary vascular congestion is noted. Bony thorax is unremarkable. Mild bibasilar subsegmental atelectasis is noted. IMPRESSION: Stable support apparatus. Stable mild central pulmonary vascular congestion. Mild bibasilar subsegmental atelectasis. Electronically Signed   By: Marijo Conception, M.D.   On: 04/18/2017 07:34  Medications:  Prior to Admission:  Prescriptions Prior to Admission  Medication Sig Dispense Refill Last Dose  . albuterol (PROVENTIL HFA;VENTOLIN HFA) 108 (90 BASE) MCG/ACT inhaler Inhale 2 puffs into the lungs every 4 (four) hours as needed for wheezing.    Taking  . albuterol (PROVENTIL) (2.5 MG/3ML) 0.083% nebulizer solution Take 2.5 mg by nebulization every 4 (four) hours as needed for wheezing or shortness of  breath.   Taking  . aspirin 325 MG tablet Take 325 mg by mouth daily.   04/11/2017 at Unknown time  . Cyanocobalamin (VITAMIN B-12) 2500 MCG SUBL Place 2,500 mcg under the tongue daily.   04/11/2017 at Unknown time  . cyclobenzaprine (FLEXERIL) 10 MG tablet Take 5 mg by mouth at bedtime.   04/11/2017 at Unknown time  . diltiazem (TIAZAC) 300 MG 24 hr capsule Take 300 mg by mouth daily.    04/11/2017 at Unknown time  . diphenhydrAMINE (BENADRYL) 25 mg capsule Take 25 mg by mouth at bedtime as needed.   04/11/2017 at Unknown time  . furosemide (LASIX) 20 MG tablet Take 40 mg by mouth daily.    04/11/2017 at Unknown time  . gabapentin (NEURONTIN) 400 MG capsule Take 400 mg by mouth 4 (four) times daily.   04/11/2017 at Unknown time  . glipiZIDE (GLUCOTROL) 5 MG tablet Take 5 mg by mouth daily before breakfast.   04/11/2017 at Unknown time  . ipratropium-albuterol (DUONEB) 0.5-2.5 (3) MG/3ML SOLN Take 3 mLs by nebulization QID.    04/11/2017 at Unknown time  . metoCLOPramide (REGLAN) 10 MG tablet Take 10 mg by mouth 4 (four) times daily.   04/11/2017 at Unknown time  . montelukast (SINGULAIR) 10 MG tablet Take 10 mg by mouth daily.   04/11/2017 at Unknown time  . omeprazole (PRILOSEC) 40 MG capsule Take 40 mg by mouth daily.   04/11/2017 at Unknown time  . rosuvastatin (CRESTOR) 20 MG tablet Take 1 tablet (20 mg total) by mouth daily. 30 tablet 3 04/11/2017 at Unknown time  . saxagliptin HCl (ONGLYZA) 5 MG TABS tablet Take 5 mg by mouth daily.   04/11/2017 at Unknown time  . tiZANidine (ZANAFLEX) 4 MG tablet Take 4 mg by mouth 3 (three) times daily.   04/11/2017 at Unknown time  . umeclidinium-vilanterol (ANORO ELLIPTA) 62.5-25 MCG/INH AEPB Inhale 1 puff into the lungs daily.   04/11/2017 at Unknown time  . cefdinir (OMNICEF) 300 MG capsule Take 300 mg by mouth 2 (two) times daily.   Completed Course at Unknown time  . insulin aspart (NOVOLOG) 100 UNIT/ML injection Per sliding scale   Taking  . metFORMIN (GLUMETZA) 1000 MG (MOD) 24  hr tablet Take 1,000 mg by mouth daily with breakfast.   Not Taking at Unknown time   Scheduled: . aspirin  300 mg Rectal Daily  . chlorhexidine gluconate (MEDLINE KIT)  15 mL Mouth Rinse BID  . enoxaparin (LOVENOX) injection  40 mg Subcutaneous Q24H  . feeding supplement (PRO-STAT SUGAR FREE 64)  30 mL Per Tube BID  . feeding supplement (VITAL HIGH PROTEIN)  1,000 mL Per Tube Q24H  . insulin aspart  2-6 Units Subcutaneous Q4H  . insulin glargine  5 Units Subcutaneous QHS  . ipratropium-albuterol  3 mL Nebulization Q6H  . mouth rinse  15 mL Mouth Rinse QID  . methylPREDNISolone (SOLU-MEDROL) injection  80 mg Intravenous Q8H  . nicotine  14 mg Transdermal Daily  . pantoprazole (PROTONIX) IV  40 mg Intravenous Q24H   Continuous: .  sodium chloride 75 mL/hr at 04/19/17 0803  . cefTRIAXone (ROCEPHIN)  IV 1 g (04/19/17 0803)  . propofol (DIPRIVAN) infusion 35 mcg/kg/min (04/19/17 0600)   GNO:[id number] **OR** acetaminophen, albuterol, hydrALAZINE, ondansetron **OR** ondansetron (ZOFRAN) IV, senna-docusate  Assesment: He was admitted with acute hypoxic and hypercapnic respiratory failure and community-acquired pneumonia. He eventually required intubation and mechanical ventilation. He was not able to wean yesterday. His oxygenation is marginal today. He has pretty severe COPD at baseline. Principal Problem:   Acute respiratory failure with hypoxia and hypercapnia (HCC) Active Problems:   Diabetes mellitus ([place])   Hyperlipidemia   TOBACCO ABUSE   Essential hypertension   ATRIAL FIBRILLATION   CHRONIC OBSTRUCTIVE PULMONARY DISEASE, ACUTE EXACERBATION    Plan: Continue treatments. Respiratory is going to work with him and see if we can get his oxygen better and then we may have some opportunity to see if he can wean.    LOS: 7 days   Kaien Pezzullo L 04/19/2017, 8:29 AM

## 2017-04-19 NOTE — Progress Notes (Signed)
Patient lost his only IV access. New access was obtained. Paged Dr. Burnis Medin to express the need for PICC line. He ordered placement. Called daughter to inform her and get consent, she was agreeable to this. Phone consent was obtained with Deno Etienne, RN. [facility] and they will be here for PICC placement tonight.

## 2017-04-19 NOTE — Plan of Care (Signed)
Problem: Skin Integrity: Goal: Risk for impaired skin integrity will decrease Outcome: Progressing Rotating patient every 2 hours. Sacral foam in place. Heel Foams in place.

## 2017-04-19 NOTE — Progress Notes (Signed)
Patient has had difficulty maintaining O2 saturations today with weaning. He was weaned down to 40% earlier today and ended up dropping down to 87-89%, respiratory was called and increased O2 percentage to 45. Later this afternoon around 1600 patient's O2 saturations started to drop into the 80s again. He is currently on 50% and O2 saturations ar 94-96%. Daughter was called and notified of all of this.

## 2017-04-20 ENCOUNTER — Inpatient Hospital Stay (HOSPITAL_COMMUNITY): Payer: Medicare PPO

## 2017-04-20 DIAGNOSIS — J441 Chronic obstructive pulmonary disease with (acute) exacerbation: Principal | ICD-10-CM

## 2017-04-20 LAB — GLUCOSE, CAPILLARY
GLUCOSE-CAPILLARY: 165 mg/dL — AB (ref 65–99)
GLUCOSE-CAPILLARY: 192 mg/dL — AB (ref 65–99)
GLUCOSE-CAPILLARY: 205 mg/dL — AB (ref 65–99)
GLUCOSE-CAPILLARY: 221 mg/dL — AB (ref 65–99)
GLUCOSE-CAPILLARY: 243 mg/dL — AB (ref 65–99)
Glucose-Capillary: 113 mg/dL — ABNORMAL HIGH (ref 65–99)
Glucose-Capillary: 253 mg/dL — ABNORMAL HIGH (ref 65–99)

## 2017-04-20 LAB — COMPREHENSIVE METABOLIC PANEL
ALBUMIN: 2.6 g/dL — AB (ref 3.5–5.0)
ALT: 54 U/L (ref 17–63)
AST: 32 U/L (ref 15–41)
Alkaline Phosphatase: 46 U/L (ref 38–126)
Anion gap: 6 (ref 5–15)
BILIRUBIN TOTAL: 0.5 mg/dL (ref 0.3–1.2)
BUN: 46 mg/dL — AB (ref 6–20)
CHLORIDE: 103 mmol/L (ref 101–111)
CO2: 35 mmol/L — ABNORMAL HIGH (ref 22–32)
CREATININE: 0.6 mg/dL — AB (ref 0.61–1.24)
Calcium: 8.8 mg/dL — ABNORMAL LOW (ref 8.9–10.3)
GFR calc Af Amer: >60 mL/min (ref >60)
GFR calc non Af Amer: >60 mL/min (ref >60)
GLUCOSE: 208 mg/dL — AB (ref 65–99)
POTASSIUM: 3.7 mmol/L (ref 3.5–5.1)
Sodium: 144 mmol/L (ref 135–145)
Total Protein: 5.3 g/dL — ABNORMAL LOW (ref 6.5–8.1)

## 2017-04-20 LAB — BLOOD GAS, ARTERIAL
ACID-BASE EXCESS: 8.2 mmol/L — AB (ref 0.0–2.0)
Acid-Base Excess: 8.8 mmol/L — ABNORMAL HIGH (ref 0.0–2.0)
Bicarbonate: 30.9 mmol/L — ABNORMAL HIGH (ref 20.0–28.0)
Bicarbonate: 31.8 mmol/L — ABNORMAL HIGH (ref 20.0–28.0)
DRAWN BY: 382351
Drawn by: 270161
FIO2: 50
FIO2: 40
MECHVT: 500 mL
O2 Saturation: 98.3 %
O2 Saturation: 96.5 %
PATIENT TEMPERATURE: 37
PEEP/CPAP: 5 cmH2O
PEEP/CPAP: 5 cmH2O
PO2 ART: 164 mmHg — AB (ref 83.0–108.0)
PO2 ART: 98.1 mmHg (ref 83.0–108.0)
PRESSURE SUPPORT: 10 cmH2O
Patient temperature: 37
RATE: 18 resp/min
pCO2 arterial: 52.6 mmHg — ABNORMAL HIGH (ref 32.0–48.0)
pCO2 arterial: 49 mmHg — ABNORMAL HIGH (ref 32.0–48.0)
pH, Arterial: 7.413 (ref 7.350–7.450)
pH, Arterial: 7.446 (ref 7.350–7.450)

## 2017-04-20 LAB — CBC
HEMATOCRIT: 42.5 % (ref 39.0–52.0)
Hemoglobin: 13.5 g/dL (ref 13.0–17.0)
MCH: 26.5 pg (ref 26.0–34.0)
MCHC: 31.8 g/dL (ref 30.0–36.0)
MCV: 83.3 fL (ref 78.0–100.0)
Platelets: 201 10*3/uL (ref 150–400)
RBC: 5.1 MIL/uL (ref 4.22–5.81)
RDW: 18.3 % — AB (ref 11.5–15.5)
WBC: 7.7 10*3/uL (ref 4.0–10.5)

## 2017-04-20 MED ORDER — FUROSEMIDE 10 MG/ML IJ SOLN
40.0000 mg | Freq: Once | INTRAMUSCULAR | Status: AC
  Administered 2017-04-20: 40 mg via INTRAVENOUS

## 2017-04-20 MED ORDER — FUROSEMIDE 10 MG/ML IJ SOLN
40.0000 mg | Freq: Once | INTRAMUSCULAR | Status: AC
  Administered 2017-04-20: 40 mg via INTRAVENOUS
  Filled 2017-04-20: qty 4

## 2017-04-20 NOTE — Progress Notes (Signed)
Nutrition Follow-up   INTERVENTION:  Follow up for diet advancement and assess need for supplemental nutrition   NUTRITION DIAGNOSIS:   Inadequate oral intake related to inability to eat as evidenced by NPO   GOAL:   Provide needs based on ASPEN/SCCM guidelines   MONITOR:  Diet advancement/ tolerance, Po intake, labs and wt trends    ASSESSMENT:  Patient is currently intubated on ventilator support. He presents with COPD exacerbation. He has a hx of DM, HTN, GERD and HLD.  The patient was able to be extubated around lunch today. He is NPO for now. Will follow for diet advancement and state of his appetite.   Weight: Stable this admission mostly between 86-88 kg. Re-estimated nutrition needs now that he is off the vent.   His last BM documented as  8/12.     Recent Labs Lab 04/14/17 1642 04/15/17 0445 04/15/17 1631  04/17/17 0409 04/18/17 0401 04/19/17 0521 04/20/17 0724  NA  --  143  --   < > 145 144  --  144  K  --  3.6  --   < > 4.2 4.7  --  3.7  CL  --  99*  --   < > 104 105  --  103  CO2  --  37*  --   < > 34* 32  --  35*  BUN  --  36*  --   < > 30* 32*  --  46*  CREATININE  --  0.76  --   < > 0.69 0.62 0.60* 0.60*  CALCIUM  --  8.7*  --   < > 8.6* 8.7*  --  8.8*  MG 2.3 2.2 2.2  --   --   --   --   --   PHOS 2.7 2.9 3.4  --   --   --   --   --   GLUCOSE  --  173*  --   < > 152* 177*  --  208*  < > = values in this interval not displayed.   Labs : BUN 46 , Glucose 208                                      CBG (last 3)   Recent Labs  04/20/17 0254 04/20/17 0800 04/20/17 1152  GLUCAP 192* 205* 165*     Diet Order:  Diet NPO time specified  Skin:  Reviewed, no issues  Last BM:  8/12   Height:   Ht Readings from Last 1 Encounters:  04/14/17 5\' 11"  (1.803 m)    Weight:   Wt Readings from Last 1 Encounters:  04/20/17 190 lb 14.7 oz (86.6 kg)    Ideal Body Weight:  78 kg  BMI:  Body mass index is 26.63 kg/m.  Re-estimated Nutritional Needs:   Kcal:  1870-2040  Protein:  99-124  Fluid:  1.8 liters daily  EDUCATION NEEDS: none identified at this time    Colman Cater MS,RD,CSG,LDN Office: #[phone] Pager: [phone]

## 2017-04-20 NOTE — Progress Notes (Signed)
PROGRESS NOTE    Andrew [name]  WRU:[id number] DOB: 05-27-1941 DOA: 04/12/2017 PCP: System, Provider Not In     Brief Narrative:  76 y/o man admitted to the hospital on 8/8 due to shortness of breath and unresponsiveness presumed due to COPD with acute exacerbation. He required BiPAP upon admission and unfortunately he failed BiPAP and was intubated on the night of 8/9.   Assessment & Plan:   Principal Problem:   Acute respiratory failure with hypoxia and hypercapnia (HCC) Active Problems:   Diabetes mellitus (HCC)   Hyperlipidemia   TOBACCO ABUSE   Essential hypertension   ATRIAL FIBRILLATION   CHRONIC OBSTRUCTIVE PULMONARY DISEASE, ACUTE EXACERBATION   Acute hypoxic and hypercapnic respiratory failure 2/2 COPD/CHF   -Patient failed noninvasive therapy and was intubated on 8/9. -Appreciate Dr. Luan Pulling input and recommendations regarding ventilator management , start weaning off the o2 and the sedation today . -CXR and lung exam better after given lasix . -Continue empirical coverage with Rocephin and continue  steroids.   COPD with acute exacerbation -Continue steroids, nebs at current dose. -See above for more details.  Tobacco abuse -Will need to discuss cessation once  extubated.  Atrial fibrillation -Rate controlled, not chronically anticoagulated, continue aspirin.  Type 2 diabetes -On ICU hyperglycemia protocol with 5 units of basal Lantus. -Improved glycemic control.  Hypertension -Fair control, when necessary hydralazine -Resume oral meds once more alert.  GERD -Continue PPI.  Hyperlipidemia -Resume statin once more alert and extubated.   DVT prophylaxis: lovenox Code Status: full code Family Communication: patient only  Disposition Plan: To be determined pending medical stability  Consultants:   Pulmonary, Dr. Luan Pulling  Procedures:   ECHO as above  Antimicrobials:  Anti-infectives    Start     Dose/Rate Route Frequency Ordered Stop   04/13/17 0745  cefTRIAXone (ROCEPHIN) 1 g in dextrose 5 % 50 mL IVPB     1 g 100 mL/hr over 30 Minutes Intravenous Every 24 hours 04/13/17 0743     04/12/17 1915  azithromycin (ZITHROMAX) 500 mg in dextrose 5 % 250 mL IVPB     500 mg 250 mL/hr over 60 Minutes Intravenous Daily-1800 04/12/17 1908 04/16/17 1809       Subjective: Intubated, sedated  Objective: Vitals:   04/20/17 0530 04/20/17 0600 04/20/17 0749 04/20/17 0751  BP:  113/63    Pulse: 82 70    Resp: 18 18    Temp: 97.7 F (36.5 C) 97.9 F (36.6 C)    TempSrc:      SpO2: 97% 94% 93% 93%  Weight:      Height:        Intake/Output Summary (Last 24 hours) at 04/20/17 0832 Last data filed at 04/20/17 0600  Gross per 24 hour  Intake          2068.25 ml  Output             3500 ml  Net         -1431.75 ml   Filed Weights   04/18/17 0500 04/19/17 0500 04/20/17 0427  Weight: 87.4 kg (192 lb 10.9 oz) 88 kg (194 lb 0.1 oz) 86.6 kg (190 lb 14.7 oz)    Examination:  General exam: Intubated, sedated Respiratory system: CTAB . Cardiovascular system:RRR. No murmurs, rubs, gallops. Gastrointestinal system: Abdomen is nondistended, soft and nontender. No organomegaly or masses felt. Normal bowel sounds heard. Central nervous system: Unable to assess given current level of sedation Extremities: No C/C/E, +pedal pulses  Skin: No rashes, lesions or ulcers Psychiatry: Unable to assess given current level of sedation          Data Reviewed: I have personally reviewed following labs and imaging studies  CBC:  Recent Labs Lab 04/15/17 0445 04/16/17 0429 04/17/17 0409 04/18/17 0401 04/20/17 0724  WBC 9.4 7.6 10.0 9.9 7.7  HGB 12.8* 12.9* 13.0 13.6 13.5  HCT 40.5 41.7 41.8 43.1 42.5  MCV 83.0 84.4 83.4 83.2 83.3  PLT 225 198 168 188 102   Basic Metabolic Panel:  Recent Labs Lab 04/14/17 0446 04/14/17 1642  04/15/17 0445 04/15/17 1631 04/16/17 0429 04/17/17 0409 04/18/17 0401 04/19/17 0521  04/20/17 0724  NA  --   --   --  143  --  147* 145 144  --  144  K  --   --   --  3.6  --  4.4 4.2 4.7  --  3.7  CL  --   --   --  99*  --  104 104 105  --  103  CO2  --   --   --  37*  --  36* 34* 32  --  35*  GLUCOSE  --   --   --  173*  --  229* 152* 177*  --  208*  BUN  --   --   --  36*  --  33* 30* 32*  --  46*  CREATININE  --   --   < > 0.76  --  0.74 0.69 0.62 0.60* 0.60*  CALCIUM  --   --   --  8.7*  --  8.5* 8.6* 8.7*  --  8.8*  MG 2.3 2.3  --  2.2 2.2  --   --   --   --   --   PHOS 1.9* 2.7  --  2.9 3.4  --   --   --   --   --   < > = values in this interval not displayed. GFR: Estimated Creatinine Clearance: 81.1 mL/min (A) (by C-G formula based on SCr of 0.6 mg/dL (L)). Liver Function Tests:  Recent Labs Lab 04/20/17 0724  AST 32  ALT 54  ALKPHOS 46  BILITOT 0.5  PROT 5.3*  ALBUMIN 2.6*   No results for input(s): LIPASE, AMYLASE in the last 168 hours. No results for input(s): AMMONIA in the last 168 hours. Coagulation Profile: No results for input(s): INR, PROTIME in the last 168 hours. Cardiac Enzymes: No results for input(s): CKTOTAL, CKMB, CKMBINDEX, TROPONINI in the last 168 hours. BNP (last 3 results) No results for input(s): PROBNP in the last 8760 hours. HbA1C: No results for input(s): HGBA1C in the last 72 hours. CBG:  Recent Labs Lab 04/19/17 1755 04/19/17 1952 04/20/17 0015 04/20/17 0254 04/20/17 0800  GLUCAP 172* 217* 253* 192* 205*   Lipid Profile:  Recent Labs  04/19/17 2221  TRIG 156*   Thyroid Function Tests: No results for input(s): TSH, T4TOTAL, FREET4, T3FREE, THYROIDAB in the last 72 hours. Anemia Panel: No results for input(s): VITAMINB12, FOLATE, FERRITIN, TIBC, IRON, RETICCTPCT in the last 72 hours. Urine analysis:    Component Value Date/Time   COLORURINE AMBER (A) 05/05/2013 1621   APPEARANCEUR CLEAR 05/05/2013 1621   LABSPEC 1.025 05/05/2013 1621   PHURINE 5.5 05/05/2013 1621   GLUCOSEU NEGATIVE 05/05/2013 1621    HGBUR NEGATIVE 05/05/2013 1621   HGBUR negative 03/31/2009 1514   BILIRUBINUR SMALL (A) 05/05/2013 1621   KETONESUR  NEGATIVE 05/05/2013 1621   PROTEINUR NEGATIVE 05/05/2013 1621   UROBILINOGEN 1.0 05/05/2013 1621   NITRITE NEGATIVE 05/05/2013 1621   LEUKOCYTESUR NEGATIVE 05/05/2013 1621   Sepsis Labs: '@LABRCNTIP' (procalcitonin:4,lacticidven:4)  ) Recent Results (from the past 240 hour(s))  MRSA PCR Screening     Status: None   Collection Time: 04/12/17  3:06 PM  Result Value Ref Range Status   MRSA by PCR NEGATIVE NEGATIVE Final    Comment:        The GeneXpert MRSA Assay (FDA approved for NASAL specimens only), is one component of a comprehensive MRSA colonization surveillance program. It is not intended to diagnose MRSA infection nor to guide or monitor treatment for MRSA infections.   Culture, respiratory (NON-Expectorated)     Status: None   Collection Time: 04/14/17  7:49 AM  Result Value Ref Range Status   Specimen Description TRACHEAL ASPIRATE  Final   Special Requests NONE  Final   Gram Stain   Final    ABUNDANT WBC PRESENT, PREDOMINANTLY PMN RARE SQUAMOUS EPITHELIAL CELLS PRESENT ABUNDANT GRAM POSITIVE RODS Performed at [facility], [address] [place], [place] [address]    Culture   Final    ABUNDANT CORYNEBACTERIUM STRIATUM MODERATE ENTEROBACTER SPECIES    Report Status 04/18/2017 FINAL  Final   Organism ID, Bacteria ENTEROBACTER SPECIES  Final      Susceptibility   Enterobacter species - MIC*    CEFAZOLIN >=64 RESISTANT Resistant     CEFEPIME <=1 SENSITIVE Sensitive     CEFTAZIDIME <=1 SENSITIVE Sensitive     CEFTRIAXONE <=1 SENSITIVE Sensitive     CIPROFLOXACIN <=0.25 SENSITIVE Sensitive     GENTAMICIN <=1 SENSITIVE Sensitive     IMIPENEM <=0.25 SENSITIVE Sensitive     TRIMETH/SULFA <=20 SENSITIVE Sensitive     PIP/TAZO <=4 SENSITIVE Sensitive     * MODERATE ENTEROBACTER SPECIES         Radiology Studies: Dg Chest Port 1  View  Result Date: 04/19/2017 CLINICAL DATA:  Check for PICC line placement EXAM: PORTABLE CHEST 1 VIEW COMPARISON:  04/18/2017 FINDINGS: New right-sided PICC line is noted with catheter tip in the distal superior vena cava. Endotracheal tube and nasogastric catheter are noted in satisfactory position. Cardiac shadow is stable. The lungs show some improved aeration in the bases. No new focal abnormality is seen. IMPRESSION: Status post PICC line placement in satisfactory position. Improved aeration in the bases. Electronically Signed   By: Inez Catalina M.D.   On: 04/19/2017 21:20   Dg Chest Port 1 View  Result Date: 04/18/2017 CLINICAL DATA:  Endotracheal tube placement. Respiratory failure. Unresponsive on admission. EXAM: PORTABLE CHEST 1 VIEW COMPARISON:  Earlier the same date and 04/17/2017. FINDINGS: 1645 hour. The tip of the endotracheal tube is in the mid trachea. Nasogastric tube projects below the diaphragm. A line loops over the lower left chest, probably separate from the nasogastric tube, although incompletely visualized. The heart size and mediastinal contours are stable. There are stable bibasilar pulmonary opacities, most consistent with atelectasis. No pneumothorax or significant pleural effusion. IMPRESSION: No significant changes are seen. Patchy bibasilar opacities are stable, likely atelectasis. Endotracheal tube appears satisfactorily positioned. Line projecting over the heart shadow is probably separate from the adjacent nasogastric tube, although incompletely visualized. Electronically Signed   By: Richardean Sale M.D.   On: 04/18/2017 17:15        Scheduled Meds: . aspirin  300 mg Rectal Daily  . chlorhexidine gluconate (MEDLINE KIT)  15 mL Mouth Rinse BID  . enoxaparin (LOVENOX) injection  40 mg Subcutaneous Q24H  . feeding supplement (PRO-STAT SUGAR FREE 64)  30 mL Per Tube BID  . feeding supplement (VITAL HIGH PROTEIN)  1,000 mL Per Tube Q24H  . insulin aspart  2-6 Units  Subcutaneous Q4H  . insulin glargine  5 Units Subcutaneous QHS  . ipratropium-albuterol  3 mL Nebulization Q6H  . mouth rinse  15 mL Mouth Rinse QID  . methylPREDNISolone (SOLU-MEDROL) injection  80 mg Intravenous Q8H  . nicotine  14 mg Transdermal Daily  . pantoprazole (PROTONIX) IV  40 mg Intravenous Q24H   Continuous Infusions: . cefTRIAXone (ROCEPHIN)  IV 1 g (04/20/17 0812)  . propofol (DIPRIVAN) infusion 50 mcg/kg/min (04/20/17 0427)     LOS: 8 days    Critical time spent: 45 minutes. Greater than 50% of this time was spent in direct contact with the patient coordinating care.     Waldron Session, MD [facility] Pager [phone]  If 7PM-7AM, please contact night-coverage [email or web address] Password [facility] 04/20/2017, 8:32 AM

## 2017-04-20 NOTE — Progress Notes (Addendum)
Subjective: He remains intubated and on the ventilator. He had trouble maintaining oxygen saturation yesterday evening. He is now on 50% oxygen but PO2 was about 160 this morning on the blood gas. Chest x-ray actually looks better although this morning's chest x-ray is pending.  Objective: Vital signs in last 24 hours: Temp:  [96.4 F (35.8 C)-99.3 F (37.4 C)] 97.9 F (36.6 C) (08/16 0600) Pulse Rate:  [62-83] 70 (08/16 0600) Resp:  [13-21] 18 (08/16 0600) BP: (108-175)/(63-95) 113/63 (08/16 0600) SpO2:  [88 %-100 %] 94 % (08/16 0600) FiO2 (%):  [45 %-50 %] 50 % (08/16 0405) Weight:  [86.6 kg (190 lb 14.7 oz)] 86.6 kg (190 lb 14.7 oz) (08/16 0427) Weight change: -1.4 kg (-3 lb 1.4 oz) Last BM Date: 04/16/17  Intake/Output from previous day: 08/15 0701 - 08/16 0700 In: 2068.3 [I.V.:498.3; NG/GT:1520; IV Piggyback:50] Out: 3500 [Urine:3500]  PHYSICAL EXAM General appearance: alert, cooperative and no distress Resp: rhonchi bilaterally Cardio: regular rate and rhythm, S1, S2 normal, no murmur, click, rub or gallop GI: soft, non-tender; bowel sounds normal; no masses,  no organomegaly Extremities: extremities normal, atraumatic, no cyanosis or edema Skin warm and dry  Lab Results:  Results for orders placed or performed during the hospital encounter of 04/12/17 (from the past 48 hour(s))  Glucose, capillary     Status: Abnormal   Collection Time: 04/18/17  7:36 AM  Result Value Ref Range   Glucose-Capillary 186 (H) 65 - 99 mg/dL  Glucose, capillary     Status: Abnormal   Collection Time: 04/18/17 11:48 AM  Result Value Ref Range   Glucose-Capillary 235 (H) 65 - 99 mg/dL  Glucose, capillary     Status: Abnormal   Collection Time: 04/18/17  4:24 PM  Result Value Ref Range   Glucose-Capillary 166 (H) 65 - 99 mg/dL  Glucose, capillary     Status: Abnormal   Collection Time: 04/18/17  8:00 PM  Result Value Ref Range   Glucose-Capillary 225 (H) 65 - 99 mg/dL   Comment 1  Notify RN   Glucose, capillary     Status: Abnormal   Collection Time: 04/19/17 12:12 AM  Result Value Ref Range   Glucose-Capillary 198 (H) 65 - 99 mg/dL   Comment 1 Notify RN   Glucose, capillary     Status: Abnormal   Collection Time: 04/19/17  4:00 AM  Result Value Ref Range   Glucose-Capillary 132 (H) 65 - 99 mg/dL   Comment 1 Notify RN   Creatinine, serum     Status: Abnormal   Collection Time: 04/19/17  5:21 AM  Result Value Ref Range   Creatinine, Ser 0.60 (L) 0.61 - 1.24 mg/dL   GFR calc non Af Amer >60 >60 mL/min   GFR calc Af Amer >60 >60 mL/min    Comment: (NOTE) The eGFR has been calculated using the CKD EPI equation. This calculation has not been validated in all clinical situations. eGFR's persistently <60 mL/min signify possible Chronic Kidney Disease.   Blood gas, arterial     Status: Abnormal   Collection Time: 04/19/17  6:09 AM  Result Value Ref Range   FIO2 45.00    Delivery systems VENTILATOR    Mode PRESSURE REGULATED VOLUME CONTROL    VT 500 mL   LHR 18 resp/min   Peep/cpap 5.0 cm H20   pH, Arterial 7.414 7.350 - 7.450   pCO2 arterial 50.0 (H) 32.0 - 48.0 mmHg   pO2, Arterial 89.0 83.0 - 108.0 mmHg  Bicarbonate 29.8 (H) 20.0 - 28.0 mmol/L   Acid-Base Excess 6.8 (H) 0.0 - 2.0 mmol/L   O2 Saturation 95.5 %   Patient temperature 37.0    Collection site RIGHT RADIAL    Drawn by [id number]    Sample type ARTERIAL DRAW    Allens test (pass/fail) PASS PASS  Glucose, capillary     Status: Abnormal   Collection Time: 04/19/17  7:29 AM  Result Value Ref Range   Glucose-Capillary 196 (H) 65 - 99 mg/dL  Glucose, capillary     Status: Abnormal   Collection Time: 04/19/17  1:23 PM  Result Value Ref Range   Glucose-Capillary 220 (H) 65 - 99 mg/dL  Glucose, capillary     Status: Abnormal   Collection Time: 04/19/17  5:55 PM  Result Value Ref Range   Glucose-Capillary 172 (H) 65 - 99 mg/dL  Glucose, capillary     Status: Abnormal   Collection Time: 04/19/17   7:52 PM  Result Value Ref Range   Glucose-Capillary 217 (H) 65 - 99 mg/dL  Triglycerides     Status: Abnormal   Collection Time: 04/19/17 10:21 PM  Result Value Ref Range   Triglycerides 156 (H) <150 mg/dL  Glucose, capillary     Status: Abnormal   Collection Time: 04/20/17 12:15 AM  Result Value Ref Range   Glucose-Capillary 253 (H) 65 - 99 mg/dL  Glucose, capillary     Status: Abnormal   Collection Time: 04/20/17  2:54 AM  Result Value Ref Range   Glucose-Capillary 192 (H) 65 - 99 mg/dL  Blood gas, arterial     Status: Abnormal   Collection Time: 04/20/17  4:17 AM  Result Value Ref Range   FIO2 50.00    Delivery systems VENTILATOR    Mode PRESSURE REGULATED VOLUME CONTROL    VT 500 mL   LHR 18 resp/min   Peep/cpap 5.0 cm H20   pH, Arterial 7.413 7.350 - 7.450   pCO2 arterial 52.6 (H) 32.0 - 48.0 mmHg   pO2, Arterial 164 (H) 83.0 - 108.0 mmHg   Bicarbonate 30.9 (H) 20.0 - 28.0 mmol/L   Acid-Base Excess 8.2 (H) 0.0 - 2.0 mmol/L   O2 Saturation 98.3 %   Patient temperature 37.0    Collection site RIGHT RADIAL    Drawn by [id number]    Sample type ARTERIAL DRAW    Allens test (pass/fail) PASS PASS   Lines: Endotracheal tube and orogastric tube PICC line Foley ABGS  Recent Labs  04/20/17 0417  PHART 7.413  PO2ART 164*  HCO3 30.9*   CULTURES Recent Results (from the past 240 hour(s))  MRSA PCR Screening     Status: None   Collection Time: 04/12/17  3:06 PM  Result Value Ref Range Status   MRSA by PCR NEGATIVE NEGATIVE Final    Comment:        The GeneXpert MRSA Assay (FDA approved for NASAL specimens only), is one component of a comprehensive MRSA colonization surveillance program. It is not intended to diagnose MRSA infection nor to guide or monitor treatment for MRSA infections.   Culture, respiratory (NON-Expectorated)     Status: None   Collection Time: 04/14/17  7:49 AM  Result Value Ref Range Status   Specimen Description TRACHEAL ASPIRATE  Final    Special Requests NONE  Final   Gram Stain   Final    ABUNDANT WBC PRESENT, PREDOMINANTLY PMN RARE SQUAMOUS EPITHELIAL CELLS PRESENT ABUNDANT GRAM POSITIVE RODS Performed at [facility]  Lab, [address] [place], [place] [address]    Culture   Final    ABUNDANT CORYNEBACTERIUM STRIATUM MODERATE ENTEROBACTER SPECIES    Report Status 04/18/2017 FINAL  Final   Organism ID, Bacteria ENTEROBACTER SPECIES  Final      Susceptibility   Enterobacter species - MIC*    CEFAZOLIN >=64 RESISTANT Resistant     CEFEPIME <=1 SENSITIVE Sensitive     CEFTAZIDIME <=1 SENSITIVE Sensitive     CEFTRIAXONE <=1 SENSITIVE Sensitive     CIPROFLOXACIN <=0.25 SENSITIVE Sensitive     GENTAMICIN <=1 SENSITIVE Sensitive     IMIPENEM <=0.25 SENSITIVE Sensitive     TRIMETH/SULFA <=20 SENSITIVE Sensitive     PIP/TAZO <=4 SENSITIVE Sensitive     * MODERATE ENTEROBACTER SPECIES   Studies/Results: Dg Chest Port 1 View  Result Date: 04/19/2017 CLINICAL DATA:  Check for PICC line placement EXAM: PORTABLE CHEST 1 VIEW COMPARISON:  04/18/2017 FINDINGS: New right-sided PICC line is noted with catheter tip in the distal superior vena cava. Endotracheal tube and nasogastric catheter are noted in satisfactory position. Cardiac shadow is stable. The lungs show some improved aeration in the bases. No new focal abnormality is seen. IMPRESSION: Status post PICC line placement in satisfactory position. Improved aeration in the bases. Electronically Signed   By: Inez Catalina M.D.   On: 04/19/2017 21:20   Dg Chest Port 1 View  Result Date: 04/18/2017 CLINICAL DATA:  Endotracheal tube placement. Respiratory failure. Unresponsive on admission. EXAM: PORTABLE CHEST 1 VIEW COMPARISON:  Earlier the same date and 04/17/2017. FINDINGS: 1645 hour. The tip of the endotracheal tube is in the mid trachea. Nasogastric tube projects below the diaphragm. A line loops over the lower left chest, probably separate from the nasogastric tube,  although incompletely visualized. The heart size and mediastinal contours are stable. There are stable bibasilar pulmonary opacities, most consistent with atelectasis. No pneumothorax or significant pleural effusion. IMPRESSION: No significant changes are seen. Patchy bibasilar opacities are stable, likely atelectasis. Endotracheal tube appears satisfactorily positioned. Line projecting over the heart shadow is probably separate from the adjacent nasogastric tube, although incompletely visualized. Electronically Signed   By: Richardean Sale M.D.   On: 04/18/2017 17:15    Medications:  Prior to Admission:  Prescriptions Prior to Admission  Medication Sig Dispense Refill Last Dose  . albuterol (PROVENTIL HFA;VENTOLIN HFA) 108 (90 BASE) MCG/ACT inhaler Inhale 2 puffs into the lungs every 4 (four) hours as needed for wheezing.    Taking  . albuterol (PROVENTIL) (2.5 MG/3ML) 0.083% nebulizer solution Take 2.5 mg by nebulization every 4 (four) hours as needed for wheezing or shortness of breath.   Taking  . aspirin 325 MG tablet Take 325 mg by mouth daily.   04/11/2017 at Unknown time  . Cyanocobalamin (VITAMIN B-12) 2500 MCG SUBL Place 2,500 mcg under the tongue daily.   04/11/2017 at Unknown time  . cyclobenzaprine (FLEXERIL) 10 MG tablet Take 5 mg by mouth at bedtime.   04/11/2017 at Unknown time  . diltiazem (TIAZAC) 300 MG 24 hr capsule Take 300 mg by mouth daily.    04/11/2017 at Unknown time  . diphenhydrAMINE (BENADRYL) 25 mg capsule Take 25 mg by mouth at bedtime as needed.   04/11/2017 at Unknown time  . furosemide (LASIX) 20 MG tablet Take 40 mg by mouth daily.    04/11/2017 at Unknown time  . gabapentin (NEURONTIN) 400 MG capsule Take 400 mg by mouth 4 (four) times daily.   04/11/2017  at Unknown time  . glipiZIDE (GLUCOTROL) 5 MG tablet Take 5 mg by mouth daily before breakfast.   04/11/2017 at Unknown time  . ipratropium-albuterol (DUONEB) 0.5-2.5 (3) MG/3ML SOLN Take 3 mLs by nebulization QID.    04/11/2017  at Unknown time  . metoCLOPramide (REGLAN) 10 MG tablet Take 10 mg by mouth 4 (four) times daily.   04/11/2017 at Unknown time  . montelukast (SINGULAIR) 10 MG tablet Take 10 mg by mouth daily.   04/11/2017 at Unknown time  . omeprazole (PRILOSEC) 40 MG capsule Take 40 mg by mouth daily.   04/11/2017 at Unknown time  . rosuvastatin (CRESTOR) 20 MG tablet Take 1 tablet (20 mg total) by mouth daily. 30 tablet 3 04/11/2017 at Unknown time  . saxagliptin HCl (ONGLYZA) 5 MG TABS tablet Take 5 mg by mouth daily.   04/11/2017 at Unknown time  . tiZANidine (ZANAFLEX) 4 MG tablet Take 4 mg by mouth 3 (three) times daily.   04/11/2017 at Unknown time  . umeclidinium-vilanterol (ANORO ELLIPTA) 62.5-25 MCG/INH AEPB Inhale 1 puff into the lungs daily.   04/11/2017 at Unknown time  . cefdinir (OMNICEF) 300 MG capsule Take 300 mg by mouth 2 (two) times daily.   Completed Course at Unknown time  . insulin aspart (NOVOLOG) 100 UNIT/ML injection Per sliding scale   Taking  . metFORMIN (GLUMETZA) 1000 MG (MOD) 24 hr tablet Take 1,000 mg by mouth daily with breakfast.   Not Taking at Unknown time   Scheduled: . aspirin  300 mg Rectal Daily  . chlorhexidine gluconate (MEDLINE KIT)  15 mL Mouth Rinse BID  . enoxaparin (LOVENOX) injection  40 mg Subcutaneous Q24H  . feeding supplement (PRO-STAT SUGAR FREE 64)  30 mL Per Tube BID  . feeding supplement (VITAL HIGH PROTEIN)  1,000 mL Per Tube Q24H  . insulin aspart  2-6 Units Subcutaneous Q4H  . insulin glargine  5 Units Subcutaneous QHS  . ipratropium-albuterol  3 mL Nebulization Q6H  . mouth rinse  15 mL Mouth Rinse QID  . methylPREDNISolone (SOLU-MEDROL) injection  80 mg Intravenous Q8H  . nicotine  14 mg Transdermal Daily  . pantoprazole (PROTONIX) IV  40 mg Intravenous Q24H   Continuous: . cefTRIAXone (ROCEPHIN)  IV Stopped (04/19/17 0840)  . propofol (DIPRIVAN) infusion 50 mcg/kg/min (04/20/17 0427)   SKS:[id number] **OR** acetaminophen, albuterol, hydrALAZINE,  ondansetron **OR** ondansetron (ZOFRAN) IV, senna-docusate  Assesment: He was admitted with acute hypoxic and hypercapnic respiratory failure. He has COPD exacerbation. He has community-acquired pneumonia. He's been intubated and on ventilator now coming up on 7 days. His chest x-ray has improved. He's had trouble maintaining his oxygenation but PO2 was 160 on 50% this morning so we should be able to wean his oxygen and then perhaps could proceed with weaning trial. Principal Problem:   Acute respiratory failure with hypoxia and hypercapnia (HCC) Active Problems:   Diabetes mellitus ([place])   Hyperlipidemia   TOBACCO ABUSE   Essential hypertension   ATRIAL FIBRILLATION   CHRONIC OBSTRUCTIVE PULMONARY DISEASE, ACUTE EXACERBATION    Plan: As above    LOS: 8 days   Honesti Seaberg L 04/20/2017, 7:11 AM

## 2017-04-20 NOTE — Progress Notes (Signed)
CPAP/PSV began at this time. Patient is doing well on this mode. I will give him about 45 minutes on this wean and get an ABG to determine extubation possiblity. RT will continue to monitor.

## 2017-04-20 NOTE — Progress Notes (Signed)
Gave pat 120 ml of ice water, per MD order.  Pat tolerated fluid well, no coughing. Chiquita Heckert, RN

## 2017-04-20 NOTE — Progress Notes (Signed)
ABG done with good results and patient NIF was -36 and VC was 1500+. Patient extubated to 4lpm [place]. He looks slightly labored at this time but claims to feel fine. SPO2 is 93-95% and HR 74.

## 2017-04-21 ENCOUNTER — Inpatient Hospital Stay (HOSPITAL_COMMUNITY): Payer: Medicare PPO

## 2017-04-21 LAB — CBC
HCT: 40.6 % (ref 39.0–52.0)
HEMOGLOBIN: 13 g/dL (ref 13.0–17.0)
MCH: 26.6 pg (ref 26.0–34.0)
MCHC: 32 g/dL (ref 30.0–36.0)
MCV: 83.2 fL (ref 78.0–100.0)
Platelets: 194 10*3/uL (ref 150–400)
RBC: 4.88 MIL/uL (ref 4.22–5.81)
RDW: 17.8 % — AB (ref 11.5–15.5)
WBC: 7.6 10*3/uL (ref 4.0–10.5)

## 2017-04-21 LAB — GLUCOSE, CAPILLARY
GLUCOSE-CAPILLARY: 142 mg/dL — AB (ref 65–99)
GLUCOSE-CAPILLARY: 303 mg/dL — AB (ref 65–99)
GLUCOSE-CAPILLARY: 334 mg/dL — AB (ref 65–99)
Glucose-Capillary: 138 mg/dL — ABNORMAL HIGH (ref 65–99)
Glucose-Capillary: 311 mg/dL — ABNORMAL HIGH (ref 65–99)
Glucose-Capillary: 217 mg/dL — ABNORMAL HIGH (ref 65–99)

## 2017-04-21 LAB — COMPREHENSIVE METABOLIC PANEL
ALBUMIN: 2.6 g/dL — AB (ref 3.5–5.0)
ALT: 54 U/L (ref 17–63)
AST: 30 U/L (ref 15–41)
Alkaline Phosphatase: 47 U/L (ref 38–126)
Anion gap: 7 (ref 5–15)
BUN: 49 mg/dL — ABNORMAL HIGH (ref 6–20)
CALCIUM: 8.5 mg/dL — AB (ref 8.9–10.3)
CHLORIDE: 101 mmol/L (ref 101–111)
CO2: 37 mmol/L — ABNORMAL HIGH (ref 22–32)
CREATININE: 0.67 mg/dL (ref 0.61–1.24)
Glucose, Bld: 127 mg/dL — ABNORMAL HIGH (ref 65–99)
Potassium: 3.5 mmol/L (ref 3.5–5.1)
SODIUM: 145 mmol/L (ref 135–145)
Total Bilirubin: 0.9 mg/dL (ref 0.3–1.2)
Total Protein: 5.3 g/dL — ABNORMAL LOW (ref 6.5–8.1)

## 2017-04-21 MED ORDER — DOXYCYCLINE HYCLATE 100 MG PO TABS
100.0000 mg | ORAL_TABLET | Freq: Two times a day (BID) | ORAL | Status: DC
  Administered 2017-04-21 – 2017-04-22 (×4): 100 mg via ORAL
  Filled 2017-04-21 (×5): qty 1

## 2017-04-21 MED ORDER — ASPIRIN 325 MG PO TABS
325.0000 mg | ORAL_TABLET | Freq: Every day | ORAL | Status: DC
  Administered 2017-04-21 – 2017-04-22 (×2): 325 mg via ORAL
  Filled 2017-04-21 (×6): qty 1

## 2017-04-21 MED ORDER — CHLORHEXIDINE GLUCONATE CLOTH 2 % EX PADS
6.0000 | MEDICATED_PAD | Freq: Every day | CUTANEOUS | Status: DC
  Administered 2017-04-21 – 2017-04-23 (×3): 6 via TOPICAL

## 2017-04-21 MED ORDER — INSULIN ASPART 100 UNIT/ML ~~LOC~~ SOLN
0.0000 [IU] | Freq: Three times a day (TID) | SUBCUTANEOUS | Status: DC
  Administered 2017-04-21: 7 [IU] via SUBCUTANEOUS
  Administered 2017-04-21: 5 [IU] via SUBCUTANEOUS
  Administered 2017-04-22 (×2): 2 [IU] via SUBCUTANEOUS
  Administered 2017-04-22: 1 [IU] via SUBCUTANEOUS
  Administered 2017-04-23 (×2): 2 [IU] via SUBCUTANEOUS
  Administered 2017-04-24: 1 [IU] via SUBCUTANEOUS
  Administered 2017-04-24: 2 [IU] via SUBCUTANEOUS
  Administered 2017-04-24: 1 [IU] via SUBCUTANEOUS

## 2017-04-21 MED ORDER — FUROSEMIDE 10 MG/ML IJ SOLN
20.0000 mg | Freq: Once | INTRAMUSCULAR | Status: AC
  Administered 2017-04-21: 20 mg via INTRAVENOUS
  Filled 2017-04-21: qty 2

## 2017-04-21 MED ORDER — FUROSEMIDE 10 MG/ML IJ SOLN
20.0000 mg | Freq: Once | INTRAMUSCULAR | Status: DC
  Filled 2017-04-21: qty 2

## 2017-04-21 MED ORDER — PREDNISONE 10 MG PO TABS
30.0000 mg | ORAL_TABLET | Freq: Every day | ORAL | Status: DC
  Administered 2017-04-22: 11:00:00 30 mg via ORAL

## 2017-04-21 MED ORDER — SODIUM CHLORIDE 0.9% FLUSH
10.0000 mL | Freq: Two times a day (BID) | INTRAVENOUS | Status: DC
  Administered 2017-04-21 – 2017-04-23 (×7): 10 mL

## 2017-04-21 MED ORDER — PANTOPRAZOLE SODIUM 40 MG PO TBEC
40.0000 mg | DELAYED_RELEASE_TABLET | Freq: Every day | ORAL | Status: DC
  Administered 2017-04-22 – 2017-04-24 (×3): 40 mg via ORAL
  Filled 2017-04-21 (×3): qty 1

## 2017-04-21 MED ORDER — SODIUM CHLORIDE 0.9% FLUSH: 10.0000 mL | INTRAVENOUS | Status: DC | PRN

## 2017-04-21 NOTE — Care Management Note (Signed)
Case Management Note  Patient Details  Name: AMARA MANALANG MRN: [id number] Date of Birth: [birth year]  If discussed at Long Length of Stay Meetings, dates discussed:  04/20/2017  Additional Comments:  Hilberto Burzynski, [name], RN 04/21/2017, 9:08 AM

## 2017-04-21 NOTE — Progress Notes (Signed)
PHARMACIST - PHYSICIAN COMMUNICATION  DR:   TRH  CONCERNING: IV to Oral Route Change Policy  RECOMMENDATION: This patient is receiving Protonix by the intravenous route.  Based on criteria approved by the Pharmacy and Therapeutics Committee, the intravenous medication(s) is/are being converted to the equivalent oral dose form(s).   DESCRIPTION: These criteria include:  The patient is eating (either orally or via tube) and/or has been taking other orally administered medications for a least 24 hours  The patient has no evidence of active gastrointestinal bleeding or impaired GI absorption (gastrectomy, short bowel, patient on TNA or NPO).  If you have questions about this conversion, please contact the Pharmacy Department  [x]   [phone] )  Andrew [name] []   [phone] )  Andrew [name] []   [phone] )  Andrew [name] []   [phone] )  Andrew [name] []   [phone] )  Andrew [name], Andrew [name] 04/21/2017 1:19 PM

## 2017-04-21 NOTE — Progress Notes (Signed)
PROGRESS NOTE    Andrew [name]  LGX:[id number] DOB: [birth year] DOA: 04/12/2017 PCP: System, Provider Not In     Brief Narrative:  76 y/o man admitted to the hospital on 8/8 due to shortness of breath and unresponsiveness presumed due to COPD with acute exacerbation. He required BiPAP upon admission and unfortunately he failed BiPAP and was intubated on the night of 8/9.   Assessment & Plan:   Principal Problem:   Acute respiratory failure with hypoxia and hypercapnia (HCC) Active Problems:   Diabetes mellitus ([place])   Hyperlipidemia   TOBACCO ABUSE   Essential hypertension   ATRIAL FIBRILLATION   CHRONIC OBSTRUCTIVE PULMONARY DISEASE, ACUTE EXACERBATION   Acute hypoxic and hypercapnic respiratory failure 2/2 COPD/CHF   -Patient failed noninvasive therapy and was intubated on 8/9. -extubated 04/20/17. -CXR and lung exam better after given lasix . -switch to oral doxy and prednison .   COPD with acute exacerbation -Continue steroids, nebs at current dose. -See above for more details.  Tobacco abuse -Will need to discuss cessation once  extubated.  Atrial fibrillation -Rate controlled, not chronically anticoagulated, continue aspirin.  Type 2 diabetes -On ICU hyperglycemia protocol with 5 units of basal Lantus. -Improved glycemic control.  Hypertension -Fair control, when necessary hydralazine -Resume oral meds once more alert.  GERD -Continue PPI.  Hyperlipidemia -Resume statin once more alert and extubated.  -Acute illness Myopathy :   ICU related muscle weakness with steroids effect , will start PT/OT and wean off the steroids .   DVT prophylaxis: lovenox Code Status: full code Family Communication: patient only  Disposition Plan: To be determined pending medical stability  Consultants:   Pulmonary, Dr. Luan Pulling  Procedures:   ECHO as above  Antimicrobials:  Anti-infectives    Start     Dose/Rate Route Frequency Ordered Stop   04/13/17 0745   cefTRIAXone (ROCEPHIN) 1 g in dextrose 5 % 50 mL IVPB  Status:  Discontinued     1 g 100 mL/hr over 30 Minutes Intravenous Every 24 hours 04/13/17 0743 04/21/17 0846   04/12/17 1915  azithromycin (ZITHROMAX) 500 mg in dextrose 5 % 250 mL IVPB     500 mg 250 mL/hr over 60 Minutes Intravenous Daily-1800 04/12/17 1908 04/16/17 1809       Subjective: S/P successful extubation 04/20/17 with good results so far , breathing comfortably , complains of weakness all over , asking for food.  Objective: Vitals:   04/21/17 0300 04/21/17 0400 04/21/17 0500 04/21/17 0600  BP: 117/71 122/60 134/70 122/71  Pulse: (!) 56 (!) 59 (!) 59 62  Resp: '12 13 14 13  ' Temp:   (!) 97.2 F (36.2 C)   TempSrc:   Axillary   SpO2: 95% 96% 96% 96%  Weight:   82.3 kg (181 lb 7 oz)   Height:        Intake/Output Summary (Last 24 hours) at 04/21/17 0846 Last data filed at 04/21/17 0600  Gross per 24 hour  Intake            678.1 ml  Output             3400 ml  Net          -2721.9 ml   Filed Weights   04/19/17 0500 04/20/17 0427 04/21/17 0500  Weight: 88 kg (194 lb 0.1 oz) 86.6 kg (190 lb 14.7 oz) 82.3 kg (181 lb 7 oz)    Examination:  General exam: Intubated, sedated Respiratory system: Clear to auscultation .  Cardiovascular system:RRR. No murmurs, rubs, gallops. Gastrointestinal system: Abdomen is nondistended, soft and nontender. No organomegaly or masses felt. Normal bowel sounds heard. Central nervous system: Unable to assess given current level of sedation Extremities: No C/C/E, +pedal pulses Skin: No rashes, lesions or ulcers Psychiatry: Unable to assess given current level of sedation          Data Reviewed: I have personally reviewed following labs and imaging studies  CBC:  Recent Labs Lab 04/16/17 0429 04/17/17 0409 04/18/17 0401 04/20/17 0724 04/21/17 0615  WBC 7.6 10.0 9.9 7.7 7.6  HGB 12.9* 13.0 13.6 13.5 13.0  HCT 41.7 41.8 43.1 42.5 40.6  MCV 84.4 83.4 83.2 83.3 83.2   PLT 198 168 188 201 621   Basic Metabolic Panel:  Recent Labs Lab 04/14/17 1642  04/15/17 0445 04/15/17 1631 04/16/17 0429 04/17/17 0409 04/18/17 0401 04/19/17 0521 04/20/17 0724 04/21/17 0615  NA  --   < > 143  --  147* 145 144  --  144 145  K  --   < > 3.6  --  4.4 4.2 4.7  --  3.7 3.5  CL  --   < > 99*  --  104 104 105  --  103 101  CO2  --   < > 37*  --  36* 34* 32  --  35* 37*  GLUCOSE  --   < > 173*  --  229* 152* 177*  --  208* 127*  BUN  --   < > 36*  --  33* 30* 32*  --  46* 49*  CREATININE  --   < > 0.76  --  0.74 0.69 0.62 0.60* 0.60* 0.67  CALCIUM  --   < > 8.7*  --  8.5* 8.6* 8.7*  --  8.8* 8.5*  MG 2.3  --  2.2 2.2  --   --   --   --   --   --   PHOS 2.7  --  2.9 3.4  --   --   --   --   --   --   < > = values in this interval not displayed. GFR: Estimated Creatinine Clearance: 81.1 mL/min (by C-G formula based on SCr of 0.67 mg/dL). Liver Function Tests:  Recent Labs Lab 04/20/17 0724 04/21/17 0615  AST 32 30  ALT 54 54  ALKPHOS 46 47  BILITOT 0.5 0.9  PROT 5.3* 5.3*  ALBUMIN 2.6* 2.6*   No results for input(s): LIPASE, AMYLASE in the last 168 hours. No results for input(s): AMMONIA in the last 168 hours. Coagulation Profile: No results for input(s): INR, PROTIME in the last 168 hours. Cardiac Enzymes: No results for input(s): CKTOTAL, CKMB, CKMBINDEX, TROPONINI in the last 168 hours. BNP (last 3 results) No results for input(s): PROBNP in the last 8760 hours. HbA1C: No results for input(s): HGBA1C in the last 72 hours. CBG:  Recent Labs Lab 04/20/17 1649 04/20/17 1945 04/21/17 0003 04/21/17 0353 04/21/17 0836  GLUCAP 113* 243* 221* 142* 138*   Lipid Profile:  Recent Labs  04/19/17 2221  TRIG 156*   Thyroid Function Tests: No results for input(s): TSH, T4TOTAL, FREET4, T3FREE, THYROIDAB in the last 72 hours. Anemia Panel: No results for input(s): VITAMINB12, FOLATE, FERRITIN, TIBC, IRON, RETICCTPCT in the last 72 hours. Urine  analysis:    Component Value Date/Time   COLORURINE AMBER (A) 05/05/2013 1621   APPEARANCEUR CLEAR 05/05/2013 1621   LABSPEC 1.025 05/05/2013 1621  PHURINE 5.5 05/05/2013 1621   GLUCOSEU NEGATIVE 05/05/2013 1621   HGBUR NEGATIVE 05/05/2013 1621   HGBUR negative 03/31/2009 1514   BILIRUBINUR SMALL (A) 05/05/2013 1621   KETONESUR NEGATIVE 05/05/2013 1621   PROTEINUR NEGATIVE 05/05/2013 1621   UROBILINOGEN 1.0 05/05/2013 1621   NITRITE NEGATIVE 05/05/2013 1621   LEUKOCYTESUR NEGATIVE 05/05/2013 1621   Sepsis Labs: '@LABRCNTIP' (procalcitonin:4,lacticidven:4)  ) Recent Results (from the past 240 hour(s))  MRSA PCR Screening     Status: None   Collection Time: 04/12/17  3:06 PM  Result Value Ref Range Status   MRSA by PCR NEGATIVE NEGATIVE Final    Comment:        The GeneXpert MRSA Assay (FDA approved for NASAL specimens only), is one component of a comprehensive MRSA colonization surveillance program. It is not intended to diagnose MRSA infection nor to guide or monitor treatment for MRSA infections.   Culture, respiratory (NON-Expectorated)     Status: None   Collection Time: 04/14/17  7:49 AM  Result Value Ref Range Status   Specimen Description TRACHEAL ASPIRATE  Final   Special Requests NONE  Final   Gram Stain   Final    ABUNDANT WBC PRESENT, PREDOMINANTLY PMN RARE SQUAMOUS EPITHELIAL CELLS PRESENT ABUNDANT GRAM POSITIVE RODS Performed at [facility], [address] [place], [place] [address]    Culture   Final    ABUNDANT CORYNEBACTERIUM STRIATUM MODERATE ENTEROBACTER SPECIES    Report Status 04/18/2017 FINAL  Final   Organism ID, Bacteria ENTEROBACTER SPECIES  Final      Susceptibility   Enterobacter species - MIC*    CEFAZOLIN >=64 RESISTANT Resistant     CEFEPIME <=1 SENSITIVE Sensitive     CEFTAZIDIME <=1 SENSITIVE Sensitive     CEFTRIAXONE <=1 SENSITIVE Sensitive     CIPROFLOXACIN <=0.25 SENSITIVE Sensitive     GENTAMICIN <=1 SENSITIVE  Sensitive     IMIPENEM <=0.25 SENSITIVE Sensitive     TRIMETH/SULFA <=20 SENSITIVE Sensitive     PIP/TAZO <=4 SENSITIVE Sensitive     * MODERATE ENTEROBACTER SPECIES         Radiology Studies: Dg Chest Port 1 View  Result Date: 04/21/2017 CLINICAL DATA:  Respiratory failure EXAM: PORTABLE CHEST 1 VIEW COMPARISON:  04/20/17 FINDINGS: Cardiac shadow is mildly enlarged. Right-sided PICC line is noted in the mid superior vena cava. The endotracheal tube and nasogastric catheter have been removed in the interval. Mild left basilar atelectasis is noted. Mild vascular congestion is seen. IMPRESSION: The slight increase in left basilar atelectasis. Mild vascular congestion. Electronically Signed   By: Inez Catalina M.D.   On: 04/21/2017 07:17   Dg Chest Port 1 View  Result Date: 04/20/2017 CLINICAL DATA:  Respiratory failure, intubation EXAM: PORTABLE CHEST 1 VIEW COMPARISON:  Portable chest x-ray of 04/19/2017 FINDINGS: The tip of the endotracheal tube is approximately 8.6 cm above the carina. Aeration of the lungs is relatively stable with minimal linear atelectasis at the left lung base. No focal pneumonia or effusion is seen. Right PICC line tip overlies the mid SVC. No pneumothorax is seen. Heart size is stable being mildly enlarged. IMPRESSION: 1. No change in aeration with mild linear atelectasis at the left lung base. 2. Tip of endotracheal tube approximately 8.6 cm above the carina. 3. Right PICC line tip overlies the mid SVC Electronically Signed   By: Ivar Drape M.D.   On: 04/20/2017 08:39   Dg Chest Port 1 View  Result Date: 04/19/2017 CLINICAL DATA:  Check for PICC line placement EXAM: PORTABLE CHEST 1 VIEW COMPARISON:  04/18/2017 FINDINGS: New right-sided PICC line is noted with catheter tip in the distal superior vena cava. Endotracheal tube and nasogastric catheter are noted in satisfactory position. Cardiac shadow is stable. The lungs show some improved aeration in the bases. No new  focal abnormality is seen. IMPRESSION: Status post PICC line placement in satisfactory position. Improved aeration in the bases. Electronically Signed   By: Inez Catalina M.D.   On: 04/19/2017 21:20        Scheduled Meds: . aspirin  325 mg Oral Daily  . chlorhexidine gluconate (MEDLINE KIT)  15 mL Mouth Rinse BID  . Chlorhexidine Gluconate Cloth  6 each Topical Daily  . enoxaparin (LOVENOX) injection  40 mg Subcutaneous Q24H  . feeding supplement (PRO-STAT SUGAR FREE 64)  30 mL Per Tube BID  . feeding supplement (VITAL HIGH PROTEIN)  1,000 mL Per Tube Q24H  . insulin aspart  2-6 Units Subcutaneous Q4H  . insulin glargine  5 Units Subcutaneous QHS  . ipratropium-albuterol  3 mL Nebulization Q6H  . mouth rinse  15 mL Mouth Rinse QID  . nicotine  14 mg Transdermal Daily  . pantoprazole (PROTONIX) IV  40 mg Intravenous Q24H  . predniSONE  30 mg Oral Q breakfast  . sodium chloride flush  10-40 mL Intracatheter Q12H   Continuous Infusions: . propofol (DIPRIVAN) infusion Stopped (04/20/17 1111)     LOS: 9 days    Critical time spent: 45 minutes. Greater than 50% of this time was spent in direct contact with the patient coordinating care.     Waldron Session, MD [facility] Pager [phone]  If 7PM-7AM, please contact night-coverage [email or web address] Password [email or web address] 04/21/2017, 8:46 AM

## 2017-04-21 NOTE — Progress Notes (Addendum)
Inpatient Diabetes Program Recommendations  AACE/ADA: New Consensus Statement on Inpatient Glycemic Control (2015)  Target Ranges:  Prepandial:   less than 140 mg/dL      Peak postprandial:   less than 180 mg/dL (1-2 hours)      Critically ill patients:  140 - 180 mg/dL   Lab Results  Component Value Date   GLUCAP 138 (H) 04/21/2017   HGBA1C 8.3 (H) 04/12/2017    Review of Glycemic ControlResults for RUPERTO, KIERNAN (MRN [id number]) as of 04/21/2017 09:35  Ref. Range 04/20/2017 16:49 04/20/2017 19:45 04/21/2017 00:03 04/21/2017 03:53 04/21/2017 08:36  Glucose-Capillary Latest Ref Range: 65 - 99 mg/dL 113 (H) 243 (H) 221 (H) 142 (H) 138 (H)   Diabetes history: Type 2 DM Outpatient Diabetes medications: Glipizide 5 mg daily, Glumetza 1000 mg daily, Onglyza 5 mg daily Current orders for Inpatient glycemic control:  Novolog 2-4-6 q 4 hours, Prednisone 30 mg daily, Lantus 5 units daily  Inpatient Diabetes Program Recommendations:  Please consider d/c of ICU glycemic control order set.  Consider changing Novolog correction to moderate tid with meals and HS.   Thanks, Adah Perl, RN, BC-ADM Inpatient Diabetes Coordinator Pager [phone] (8a-5p)

## 2017-04-21 NOTE — Plan of Care (Signed)
Problem: Activity: Goal: Risk for activity intolerance will decrease Outcome: Not Progressing Pt's legs and lt arm are very weak. Pt unable to move rt arm at this time. P.T. has been started today.

## 2017-04-21 NOTE — Progress Notes (Signed)
Subjective: He was able to be extubated yesterday and has done well since extubation. He is not complaining of any new issues. His breathing is better he says. He is coughing a little bit. He's been getting ice chips.  Objective: Vital signs in last 24 hours: Temp:  [97.2 F (36.2 C)-98.9 F (37.2 C)] 97.2 F (36.2 C) (08/17 0500) Pulse Rate:  [55-72] 62 (08/17 0600) Resp:  [12-19] 13 (08/17 0600) BP: (101-148)/(54-85) 122/71 (08/17 0600) SpO2:  [90 %-99 %] 96 % (08/17 0600) FiO2 (%):  [40 %] 40 % (08/16 1115) Weight:  [82.3 kg (181 lb 7 oz)] 82.3 kg (181 lb 7 oz) (08/17 0500) Weight change: -4.3 kg (-9 lb 7.7 oz) Last BM Date: 04/16/17  Intake/Output from previous day: 08/16 0701 - 08/17 0700 In: 728.1 [P.O.:240; I.V.:144.8; NG/GT:293.3; IV Piggyback:50] Out: 3400 [Urine:3400]  PHYSICAL EXAM General appearance: alert, cooperative and no distress Resp: rhonchi bilaterally Cardio: regular rate and rhythm, S1, S2 normal, no murmur, click, rub or gallop GI: soft, non-tender; bowel sounds normal; no masses,  no organomegaly Extremities: extremities normal, atraumatic, no cyanosis or edema Skin warm and dry  Lab Results:  Results for orders placed or performed during the hospital encounter of 04/12/17 (from the past 48 hour(s))  Glucose, capillary     Status: Abnormal   Collection Time: 04/19/17  1:23 PM  Result Value Ref Range   Glucose-Capillary 220 (H) 65 - 99 mg/dL  Glucose, capillary     Status: Abnormal   Collection Time: 04/19/17  5:55 PM  Result Value Ref Range   Glucose-Capillary 172 (H) 65 - 99 mg/dL  Glucose, capillary     Status: Abnormal   Collection Time: 04/19/17  7:52 PM  Result Value Ref Range   Glucose-Capillary 217 (H) 65 - 99 mg/dL  Triglycerides     Status: Abnormal   Collection Time: 04/19/17 10:21 PM  Result Value Ref Range   Triglycerides 156 (H) <150 mg/dL  Glucose, capillary     Status: Abnormal   Collection Time: 04/20/17 12:15 AM  Result  Value Ref Range   Glucose-Capillary 253 (H) 65 - 99 mg/dL  Glucose, capillary     Status: Abnormal   Collection Time: 04/20/17  2:54 AM  Result Value Ref Range   Glucose-Capillary 192 (H) 65 - 99 mg/dL  Blood gas, arterial     Status: Abnormal   Collection Time: 04/20/17  4:17 AM  Result Value Ref Range   FIO2 50.00    Delivery systems VENTILATOR    Mode PRESSURE REGULATED VOLUME CONTROL    VT 500 mL   LHR 18 resp/min   Peep/cpap 5.0 cm H20   pH, Arterial 7.413 7.350 - 7.450   pCO2 arterial 52.6 (H) 32.0 - 48.0 mmHg   pO2, Arterial 164 (H) 83.0 - 108.0 mmHg   Bicarbonate 30.9 (H) 20.0 - 28.0 mmol/L   Acid-Base Excess 8.2 (H) 0.0 - 2.0 mmol/L   O2 Saturation 98.3 %   Patient temperature 37.0    Collection site RIGHT RADIAL    Drawn by [id number]    Sample type ARTERIAL DRAW    Allens test (pass/fail) PASS PASS  CBC     Status: Abnormal   Collection Time: 04/20/17  7:24 AM  Result Value Ref Range   WBC 7.7 4.0 - 10.5 K/uL   RBC 5.10 4.22 - 5.81 MIL/uL   Hemoglobin 13.5 13.0 - 17.0 g/dL   HCT 42.5 39.0 - 52.0 %   MCV 83.3  78.0 - 100.0 fL   MCH 26.5 26.0 - 34.0 pg   MCHC 31.8 30.0 - 36.0 g/dL   RDW 18.3 (H) 11.5 - 15.5 %   Platelets 201 150 - 400 K/uL  Comprehensive metabolic panel     Status: Abnormal   Collection Time: 04/20/17  7:24 AM  Result Value Ref Range   Sodium 144 135 - 145 mmol/L   Potassium 3.7 3.5 - 5.1 mmol/L   Chloride 103 101 - 111 mmol/L   CO2 35 (H) 22 - 32 mmol/L   Glucose, Bld 208 (H) 65 - 99 mg/dL   BUN 46 (H) 6 - 20 mg/dL   Creatinine, Ser 0.60 (L) 0.61 - 1.24 mg/dL   Calcium 8.8 (L) 8.9 - 10.3 mg/dL   Total Protein 5.3 (L) 6.5 - 8.1 g/dL   Albumin 2.6 (L) 3.5 - 5.0 g/dL   AST 32 15 - 41 U/L   ALT 54 17 - 63 U/L   Alkaline Phosphatase 46 38 - 126 U/L   Total Bilirubin 0.5 0.3 - 1.2 mg/dL   GFR calc non Af Amer >60 >60 mL/min   GFR calc Af Amer >60 >60 mL/min    Comment: (NOTE) The eGFR has been calculated using the CKD EPI equation. This  calculation has not been validated in all clinical situations. eGFR's persistently <60 mL/min signify possible Chronic Kidney Disease.    Anion gap 6 5 - 15  Glucose, capillary     Status: Abnormal   Collection Time: 04/20/17  8:00 AM  Result Value Ref Range   Glucose-Capillary 205 (H) 65 - 99 mg/dL  Blood gas, arterial     Status: Abnormal   Collection Time: 04/20/17 11:50 AM  Result Value Ref Range   FIO2 40.00    Delivery systems VENTILATOR    Mode PRESSURE SUPPORT    Peep/cpap 5.0 cm H20   Pressure support 10.0 cm H20   pH, Arterial 7.446 7.350 - 7.450   pCO2 arterial 49.0 (H) 32.0 - 48.0 mmHg   pO2, Arterial 98.1 83.0 - 108.0 mmHg   Bicarbonate 31.8 (H) 20.0 - 28.0 mmol/L   Acid-Base Excess 8.8 (H) 0.0 - 2.0 mmol/L   O2 Saturation 96.5 %   Patient temperature 37.0    Collection site RIGHT RADIAL    Drawn by [id number]    Sample type ARTERIAL DRAW    Allens test (pass/fail) PASS PASS  Glucose, capillary     Status: Abnormal   Collection Time: 04/20/17 11:52 AM  Result Value Ref Range   Glucose-Capillary 165 (H) 65 - 99 mg/dL  Glucose, capillary     Status: Abnormal   Collection Time: 04/20/17  4:49 PM  Result Value Ref Range   Glucose-Capillary 113 (H) 65 - 99 mg/dL  Glucose, capillary     Status: Abnormal   Collection Time: 04/20/17  7:45 PM  Result Value Ref Range   Glucose-Capillary 243 (H) 65 - 99 mg/dL   Comment 1 Notify RN    Comment 2 Document in Chart   Glucose, capillary     Status: Abnormal   Collection Time: 04/21/17 12:03 AM  Result Value Ref Range   Glucose-Capillary 221 (H) 65 - 99 mg/dL   Comment 1 Notify RN    Comment 2 Document in Chart   Glucose, capillary     Status: Abnormal   Collection Time: 04/21/17  3:53 AM  Result Value Ref Range   Glucose-Capillary 142 (H) 65 - 99 mg/dL  Comment 1 Notify RN    Comment 2 Document in Chart   CBC     Status: Abnormal   Collection Time: 04/21/17  6:15 AM  Result Value Ref Range   WBC 7.6 4.0 - 10.5 K/uL    RBC 4.88 4.22 - 5.81 MIL/uL   Hemoglobin 13.0 13.0 - 17.0 g/dL   HCT 40.6 39.0 - 52.0 %   MCV 83.2 78.0 - 100.0 fL   MCH 26.6 26.0 - 34.0 pg   MCHC 32.0 30.0 - 36.0 g/dL   RDW 17.8 (H) 11.5 - 15.5 %   Platelets 194 150 - 400 K/uL  Comprehensive metabolic panel     Status: Abnormal   Collection Time: 04/21/17  6:15 AM  Result Value Ref Range   Sodium 145 135 - 145 mmol/L   Potassium 3.5 3.5 - 5.1 mmol/L   Chloride 101 101 - 111 mmol/L   CO2 37 (H) 22 - 32 mmol/L   Glucose, Bld 127 (H) 65 - 99 mg/dL   BUN 49 (H) 6 - 20 mg/dL   Creatinine, Ser 0.67 0.61 - 1.24 mg/dL   Calcium 8.5 (L) 8.9 - 10.3 mg/dL   Total Protein 5.3 (L) 6.5 - 8.1 g/dL   Albumin 2.6 (L) 3.5 - 5.0 g/dL   AST 30 15 - 41 U/L   ALT 54 17 - 63 U/L   Alkaline Phosphatase 47 38 - 126 U/L   Total Bilirubin 0.9 0.3 - 1.2 mg/dL   GFR calc non Af Amer >60 >60 mL/min   GFR calc Af Amer >60 >60 mL/min    Comment: (NOTE) The eGFR has been calculated using the CKD EPI equation. This calculation has not been validated in all clinical situations. eGFR's persistently <60 mL/min signify possible Chronic Kidney Disease.    Anion gap 7 5 - 15    ABGS  Recent Labs  04/20/17 1150  PHART 7.446  PO2ART 98.1  HCO3 31.8*   CULTURES Recent Results (from the past 240 hour(s))  MRSA PCR Screening     Status: None   Collection Time: 04/12/17  3:06 PM  Result Value Ref Range Status   MRSA by PCR NEGATIVE NEGATIVE Final    Comment:        The GeneXpert MRSA Assay (FDA approved for NASAL specimens only), is one component of a comprehensive MRSA colonization surveillance program. It is not intended to diagnose MRSA infection nor to guide or monitor treatment for MRSA infections.   Culture, respiratory (NON-Expectorated)     Status: None   Collection Time: 04/14/17  7:49 AM  Result Value Ref Range Status   Specimen Description TRACHEAL ASPIRATE  Final   Special Requests NONE  Final   Gram Stain   Final    ABUNDANT  WBC PRESENT, PREDOMINANTLY PMN RARE SQUAMOUS EPITHELIAL CELLS PRESENT ABUNDANT GRAM POSITIVE RODS Performed at [facility], [address] [place], [place] [address]    Culture   Final    ABUNDANT CORYNEBACTERIUM STRIATUM MODERATE ENTEROBACTER SPECIES    Report Status 04/18/2017 FINAL  Final   Organism ID, Bacteria ENTEROBACTER SPECIES  Final      Susceptibility   Enterobacter species - MIC*    CEFAZOLIN >=64 RESISTANT Resistant     CEFEPIME <=1 SENSITIVE Sensitive     CEFTAZIDIME <=1 SENSITIVE Sensitive     CEFTRIAXONE <=1 SENSITIVE Sensitive     CIPROFLOXACIN <=0.25 SENSITIVE Sensitive     GENTAMICIN <=1 SENSITIVE Sensitive  IMIPENEM <=0.25 SENSITIVE Sensitive     TRIMETH/SULFA <=20 SENSITIVE Sensitive     PIP/TAZO <=4 SENSITIVE Sensitive     * MODERATE ENTEROBACTER SPECIES   Studies/Results: Dg Chest Port 1 View  Result Date: 04/21/2017 CLINICAL DATA:  Respiratory failure EXAM: PORTABLE CHEST 1 VIEW COMPARISON:  04/20/17 FINDINGS: Cardiac shadow is mildly enlarged. Right-sided PICC line is noted in the mid superior vena cava. The endotracheal tube and nasogastric catheter have been removed in the interval. Mild left basilar atelectasis is noted. Mild vascular congestion is seen. IMPRESSION: The slight increase in left basilar atelectasis. Mild vascular congestion. Electronically Signed   By: Inez Catalina M.D.   On: 04/21/2017 07:17   Dg Chest Port 1 View  Result Date: 04/20/2017 CLINICAL DATA:  Respiratory failure, intubation EXAM: PORTABLE CHEST 1 VIEW COMPARISON:  Portable chest x-ray of 04/19/2017 FINDINGS: The tip of the endotracheal tube is approximately 8.6 cm above the carina. Aeration of the lungs is relatively stable with minimal linear atelectasis at the left lung base. No focal pneumonia or effusion is seen. Right PICC line tip overlies the mid SVC. No pneumothorax is seen. Heart size is stable being mildly enlarged. IMPRESSION: 1. No change in aeration with  mild linear atelectasis at the left lung base. 2. Tip of endotracheal tube approximately 8.6 cm above the carina. 3. Right PICC line tip overlies the mid SVC Electronically Signed   By: Ivar Drape M.D.   On: 04/20/2017 08:39   Dg Chest Port 1 View  Result Date: 04/19/2017 CLINICAL DATA:  Check for PICC line placement EXAM: PORTABLE CHEST 1 VIEW COMPARISON:  04/18/2017 FINDINGS: New right-sided PICC line is noted with catheter tip in the distal superior vena cava. Endotracheal tube and nasogastric catheter are noted in satisfactory position. Cardiac shadow is stable. The lungs show some improved aeration in the bases. No new focal abnormality is seen. IMPRESSION: Status post PICC line placement in satisfactory position. Improved aeration in the bases. Electronically Signed   By: Inez Catalina M.D.   On: 04/19/2017 21:20    Medications:  Prior to Admission:  Prescriptions Prior to Admission  Medication Sig Dispense Refill Last Dose  . albuterol (PROVENTIL HFA;VENTOLIN HFA) 108 (90 BASE) MCG/ACT inhaler Inhale 2 puffs into the lungs every 4 (four) hours as needed for wheezing.    Taking  . albuterol (PROVENTIL) (2.5 MG/3ML) 0.083% nebulizer solution Take 2.5 mg by nebulization every 4 (four) hours as needed for wheezing or shortness of breath.   Taking  . aspirin 325 MG tablet Take 325 mg by mouth daily.   04/11/2017 at Unknown time  . Cyanocobalamin (VITAMIN B-12) 2500 MCG SUBL Place 2,500 mcg under the tongue daily.   04/11/2017 at Unknown time  . cyclobenzaprine (FLEXERIL) 10 MG tablet Take 5 mg by mouth at bedtime.   04/11/2017 at Unknown time  . diltiazem (TIAZAC) 300 MG 24 hr capsule Take 300 mg by mouth daily.    04/11/2017 at Unknown time  . diphenhydrAMINE (BENADRYL) 25 mg capsule Take 25 mg by mouth at bedtime as needed.   04/11/2017 at Unknown time  . furosemide (LASIX) 20 MG tablet Take 40 mg by mouth daily.    04/11/2017 at Unknown time  . gabapentin (NEURONTIN) 400 MG capsule Take 400 mg by mouth 4  (four) times daily.   04/11/2017 at Unknown time  . glipiZIDE (GLUCOTROL) 5 MG tablet Take 5 mg by mouth daily before breakfast.   04/11/2017 at Unknown time  .  ipratropium-albuterol (DUONEB) 0.5-2.5 (3) MG/3ML SOLN Take 3 mLs by nebulization QID.    04/11/2017 at Unknown time  . metoCLOPramide (REGLAN) 10 MG tablet Take 10 mg by mouth 4 (four) times daily.   04/11/2017 at Unknown time  . montelukast (SINGULAIR) 10 MG tablet Take 10 mg by mouth daily.   04/11/2017 at Unknown time  . omeprazole (PRILOSEC) 40 MG capsule Take 40 mg by mouth daily.   04/11/2017 at Unknown time  . rosuvastatin (CRESTOR) 20 MG tablet Take 1 tablet (20 mg total) by mouth daily. 30 tablet 3 04/11/2017 at Unknown time  . saxagliptin HCl (ONGLYZA) 5 MG TABS tablet Take 5 mg by mouth daily.   04/11/2017 at Unknown time  . tiZANidine (ZANAFLEX) 4 MG tablet Take 4 mg by mouth 3 (three) times daily.   04/11/2017 at Unknown time  . umeclidinium-vilanterol (ANORO ELLIPTA) 62.5-25 MCG/INH AEPB Inhale 1 puff into the lungs daily.   04/11/2017 at Unknown time  . cefdinir (OMNICEF) 300 MG capsule Take 300 mg by mouth 2 (two) times daily.   Completed Course at Unknown time  . insulin aspart (NOVOLOG) 100 UNIT/ML injection Per sliding scale   Taking  . metFORMIN (GLUMETZA) 1000 MG (MOD) 24 hr tablet Take 1,000 mg by mouth daily with breakfast.   Not Taking at Unknown time   Scheduled: . aspirin  300 mg Rectal Daily  . chlorhexidine gluconate (MEDLINE KIT)  15 mL Mouth Rinse BID  . Chlorhexidine Gluconate Cloth  6 each Topical Daily  . enoxaparin (LOVENOX) injection  40 mg Subcutaneous Q24H  . feeding supplement (PRO-STAT SUGAR FREE 64)  30 mL Per Tube BID  . feeding supplement (VITAL HIGH PROTEIN)  1,000 mL Per Tube Q24H  . insulin aspart  2-6 Units Subcutaneous Q4H  . insulin glargine  5 Units Subcutaneous QHS  . ipratropium-albuterol  3 mL Nebulization Q6H  . mouth rinse  15 mL Mouth Rinse QID  . methylPREDNISolone (SOLU-MEDROL) injection  80 mg  Intravenous Q8H  . nicotine  14 mg Transdermal Daily  . pantoprazole (PROTONIX) IV  40 mg Intravenous Q24H  . sodium chloride flush  10-40 mL Intracatheter Q12H   Continuous: . cefTRIAXone (ROCEPHIN)  IV Stopped (04/20/17 0842)  . propofol (DIPRIVAN) infusion Stopped (04/20/17 1111)   SLP:[id number] **OR** acetaminophen, albuterol, hydrALAZINE, ondansetron **OR** ondansetron (ZOFRAN) IV, senna-docusate, sodium chloride flush  Assesment: He was admitted with acute hypoxic hypercapnic respiratory failure with COPD exacerbation and what appeared to be community-acquired pneumonia. He has improved and was able to be extubated yesterday. There was concern that he aspirated during his episode of severe metabolic encephalopathy that was likely related to his elevated PCO2 and hypoxia. He's taking ice chips without any problem. He should have speech evaluation but I do think the major issue was his encephalopathy. Principal Problem:   Acute respiratory failure with hypoxia and hypercapnia (HCC) Active Problems:   Diabetes mellitus ([place])   Hyperlipidemia   TOBACCO ABUSE   Essential hypertension   ATRIAL FIBRILLATION   CHRONIC OBSTRUCTIVE PULMONARY DISEASE, ACUTE EXACERBATION    Plan: Continue current treatments    LOS: 9 days   Andrew [name] L 04/21/2017, 7:56 AM

## 2017-04-21 NOTE — Evaluation (Signed)
Physical Therapy Evaluation Patient Details Name: Andrew [name] MRN: [id number] DOB: 1940-09-25 Today's Date: 04/21/2017   History of Present Illness  Andrew [name] is a 76 y.o. male with history of COPD who lives alone.  his daughter checkS on him daily, found to be weaker , on 04/12/17 was found to be unresponsive.. On 8/9 intubated. Extubated 04/20/17.  Clinical Impression  The patient has clinical signs of critical care weakness, profoundly weak in all extremities and trunk, Right is weaker than left. The patient is oriented x 3 and follows commands. Recommend OT consult . Recommend bilateral foot splints due to weakness of dorsiflexion.Pt admitted with above diagnosis. Pt currently with functional limitations due to the deficits listed below (see PT Problem List).  Pt will benefit from skilled PT to increase their independence and safety with mobility to allow discharge to the venue listed below.       Follow Up Recommendations SNF;Supervision/Assistance - 24 hour    Equipment Recommendations  Wheelchair (measurements PT)    Recommendations for Other Services   OT    Precautions / Restrictions Precautions Precautions: Fall Precaution Comments: profound weakness all extremities, right > left      Mobility  Bed Mobility Overal bed mobility: Needs Assistance Bed Mobility: Supine to Sit     Supine to sit: Total assist     General bed mobility comments: in bed chair position and sitting upright, requires assistance to forward flex.  Transfers                 General transfer comment: unable  Ambulation/Gait                Stairs            Wheelchair Mobility    Modified Rankin (Stroke Patients Only)       Balance Overall balance assessment: Needs assistance;History of Falls   Sitting balance-Leahy Scale: Zero Sitting balance - Comments: in bed-chair position                                     Pertinent Vitals/Pain Faces Pain  Scale: Hurts little more Pain Location: both ankles when dorsiflexed. Pain Descriptors / Indicators: Grimacing;Discomfort;Moaning Pain Intervention(s): Limited activity within patient's tolerance;Monitored during session    Home Living Family/patient expects to be discharged to:: Private residence Living Arrangements: Alone Available Help at Discharge: Family;Available PRN/intermittently             Additional Comments: no info available related to home environment    Prior Function Level of Independence: Independent               Hand Dominance        Extremity/Trunk Assessment   Upper Extremity Assessment Upper Extremity Assessment: RUE deficits/detail;LUE deficits/detail RUE Deficits / Details: 2 to squeeze, no extension of fingers, 1 shoulder extension, 0 flexion, 1 elbow flexion, 2 elbow extension LUE Deficits / Details: 2 elbow flexion and extension, 2+ grip, 1 finger extension, 1+ shoulder flexion, did grip cup while therapist assisted taking cup to mouth. could not grip spoon.    Lower Extremity Assessment Lower Extremity Assessment: LLE deficits/detail;RLE deficits/detail RLE Deficits / Details: 1 dorsidlexion, 1+ plantar flexion, trace hip flexion and knee extension,1knee flexion- able to rotate hip in hook lying, sensation intact LLE Deficits / Details: 1 dorsiflexion, 1+ plantar flexion, 1 knee extension, 1+ knee flexion, 1+ hip flexion, sensation intact  Cervical / Trunk Assessment Cervical / Trunk Assessment: Other exceptions Cervical / Trunk Exceptions: the patient is unable to pull self forward after placing in bed chair position. Patient did support self x 10 seconds after PT assisted trunk forward and hands placed on rails and patient gripped rails Left better than right.   Communication   Communication: No difficulties  Cognition Arousal/Alertness: Awake/alert Behavior During Therapy: WFL for tasks assessed/performed Overall Cognitive Status:  Within Functional Limits for tasks assessed                                 General Comments: patient was oriented x 4-amazingly      General Comments      Exercises     Assessment/Plan    PT Assessment Patient needs continued PT services  PT Problem List Decreased strength;Decreased range of motion;Decreased activity tolerance;Decreased mobility;Decreased coordination;Decreased cognition;Pain       PT Treatment Interventions Functional mobility training;Therapeutic activities;Therapeutic exercise;Balance training;Neuromuscular re-education;Patient/family education    PT Goals (Current goals can be found in the Care Plan section)  Acute Rehab PT Goals Patient Stated Goal: I want ice chips. PT Goal Formulation: With patient Time For Goal Achievement: 05/05/17 Potential to Achieve Goals: Fair    Frequency Min 2X/week   Barriers to discharge Decreased caregiver support      Co-evaluation               AM-PAC PT "6 Clicks" Daily Activity  Outcome Measure Difficulty turning over in bed (including adjusting bedclothes, sheets and blankets)?: Unable Difficulty moving from lying on back to sitting on the side of the bed? : Unable Difficulty sitting down on and standing up from a chair with arms (e.g., wheelchair, bedside commode, etc,.)?: Unable Help needed moving to and from a bed to chair (including a wheelchair)?: Total Help needed walking in hospital room?: Total Help needed climbing 3-5 steps with a railing? : Total 6 Click Score: 6    End of Session Equipment Utilized During Treatment: Oxygen Activity Tolerance: Patient tolerated treatment well Patient left: in bed;with call bell/phone within reach;with bed alarm set Nurse Communication: Mobility status;Need for lift equipment PT Visit Diagnosis: Muscle weakness (generalized) (M62.81)    Time: 2778-2423 PT Time Calculation (min) (ACUTE ONLY): 34 min   Charges:   PT Evaluation $PT Eval High  Complexity: 1 High PT Treatments $Therapeutic Activity: 8-22 mins   PT G CodesTresa Endo PT [phone]   Claretha Cooper 04/21/2017, 3:28 PM

## 2017-04-21 NOTE — Evaluation (Signed)
Clinical/Bedside Swallow Evaluation Patient Details  Name: Andrew [name] MRN: [id number] Date of Birth: 1941-03-04  Today's Date: 04/21/2017 Time: SLP Start Time (ACUTE ONLY): 1521 SLP Stop Time (ACUTE ONLY): 1551 SLP Time Calculation (min) (ACUTE ONLY): 30 min  Past Medical History:  Past Medical History:  Diagnosis Date  . A-fib ([place])   . Abdominal pain   . Adenomatous colon polyp   . Arthritis   . Asthma   . CAD (coronary artery disease)   . Cancer ([place])    skin, nose  . Cataract    bilateral  . Chronic LBP   . COPD (chronic obstructive pulmonary disease) ([place])   . Decreased libido   . Diverticulosis of colon   . Esophageal stricture   . Fatigue   . GERD (gastroesophageal reflux disease)   . History of small bowel obstruction   . Hyperlipidemia   . Hypertension   . Hypertrophy of prostate with urinary obstruction and other lower urinary tract symptoms (LUTS)   . Palpitations   . Peri-rectal abscess   . Personal history of renal calculi   . Prostatitis, acute   . Tobacco abuse    Past Surgical History:  Past Surgical History:  Procedure Laterality Date  . CATARACT EXTRACTION  04/2010   bilateral  . HERNIA REPAIR    . LUMBAR LAMINECTOMY     with Harrington rods  . PENILE PROSTHESIS PLACEMENT    . TONSILLECTOMY     HPI:  Andrew [name] is a 76 y.o. male with history of COPD who lives alone. his daughter checkS on him daily, found to be weaker , on 04/12/17 was found to be unresponsive.. On 8/9 intubated. Extubated 04/20/17. Pt with history of dysphagia and history of being on thickened liquids; Swallowing has been negatively impacted by compromised respiration.    Assessment / Plan / Recommendation Clinical Impression  Pt is known to SLP from previous encounter in skilled nursing setting. Pt has history of dysphagia and swallowing was negatively impacted by compromised respiratory status at that time. Pt consumed 450 cc's of thin liquids via straw two graham crackers  and puree (apple sauce) with no overt s/sx of aspiration noted. Pt did demonstrate one episode of loudly talking while chewing his food and SLP provided education of the importance of pt not talking with food into his mouth d/t already compromised respiratory status and increased risk for aspiration. SLP reviewed universal aspiration precautions including safe swallowing positioning (always sitting at 90 degrees for all PO intake). Recommend continue with regular textures (consistent carb) and thin liquids. There are no further ST needs noted at this time. ST to sign off. SLP Visit Diagnosis: Dysphagia, oropharyngeal phase (R13.12)    Aspiration Risk  Mild aspiration risk    Diet Recommendation Regular;Thin liquid   Liquid Administration via: Cup;Straw Medication Administration: Whole meds with liquid    Other  Recommendations Oral Care Recommendations: Oral care BID             Prognosis Prognosis for Safe Diet Advancement: Good      Swallow Study   General Date of Onset: 04/12/17 HPI: Andrew [name] is a 76 y.o. male with history of COPD who lives alone. his daughter checkS on him daily, found to be weaker , on 04/12/17 was found to be unresponsive.. On 8/9 intubated. Extubated 04/20/17. Pt with history of dysphagia and history of being on thickened liquids; Swallowing has been negatively impacted by compromised respiration.  Type of Study: Bedside  Swallow Evaluation Previous Swallow Assessment: None Diet Prior to this Study: Regular;Thin liquids Temperature Spikes Noted: No Respiratory Status: Nasal cannula History of Recent Intubation: Yes Length of Intubations (days): 7 days Date extubated: 04/20/17 Behavior/Cognition: Alert;Cooperative;Pleasant mood Oral Cavity Assessment: Within Functional Limits Oral Care Completed by SLP: Recent completion by staff Oral Cavity - Dentition: Dentures, top;Dentures, bottom Vision: Functional for self-feeding Self-Feeding Abilities: Total  assist Patient Positioning: Upright in bed Baseline Vocal Quality: Normal Volitional Cough: Strong Volitional Swallow: Able to elicit    Oral/Motor/Sensory Function Overall Oral Motor/Sensory Function: Within functional limits   Ice Chips Ice chips: Within functional limits   Thin Liquid Thin Liquid: Within functional limits    Nectar Thick Nectar Thick Liquid: Not tested   Honey Thick Honey Thick Liquid: Not tested   Puree Puree: Within functional limits   Solid   Andrew [name], CCC-SLP Speech Language Pathologist    Solid: Within functional limits        Wende Bushy 04/21/2017,3:55 PM

## 2017-04-22 ENCOUNTER — Inpatient Hospital Stay (HOSPITAL_COMMUNITY): Payer: Medicare PPO

## 2017-04-22 DIAGNOSIS — Z978 Presence of other specified devices: Secondary | ICD-10-CM

## 2017-04-22 DIAGNOSIS — E1149 Type 2 diabetes mellitus with other diabetic neurological complication: Secondary | ICD-10-CM

## 2017-04-22 LAB — COMPREHENSIVE METABOLIC PANEL
ALBUMIN: 2.5 g/dL — AB (ref 3.5–5.0)
ALT: 53 U/L (ref 17–63)
ANION GAP: 6 (ref 5–15)
AST: 25 U/L (ref 15–41)
Alkaline Phosphatase: 47 U/L (ref 38–126)
BUN: 39 mg/dL — ABNORMAL HIGH (ref 6–20)
CALCIUM: 8 mg/dL — AB (ref 8.9–10.3)
CHLORIDE: 99 mmol/L — AB (ref 101–111)
CO2: 36 mmol/L — AB (ref 22–32)
Creatinine, Ser: 0.6 mg/dL — ABNORMAL LOW (ref 0.61–1.24)
GFR calc non Af Amer: >60 mL/min (ref >60)
GLUCOSE: 162 mg/dL — AB (ref 65–99)
POTASSIUM: 2.8 mmol/L — AB (ref 3.5–5.1)
SODIUM: 141 mmol/L (ref 135–145)
Total Bilirubin: 0.8 mg/dL (ref 0.3–1.2)
Total Protein: 4.8 g/dL — ABNORMAL LOW (ref 6.5–8.1)

## 2017-04-22 LAB — CBC
HCT: 40.7 % (ref 39.0–52.0)
HEMOGLOBIN: 12.9 g/dL — AB (ref 13.0–17.0)
MCH: 26.3 pg (ref 26.0–34.0)
MCHC: 31.7 g/dL (ref 30.0–36.0)
MCV: 83.1 fL (ref 78.0–100.0)
PLATELETS: 188 10*3/uL (ref 150–400)
RBC: 4.9 MIL/uL (ref 4.22–5.81)
RDW: 17.7 % — ABNORMAL HIGH (ref 11.5–15.5)
WBC: 7.8 10*3/uL (ref 4.0–10.5)

## 2017-04-22 LAB — GLUCOSE, CAPILLARY
GLUCOSE-CAPILLARY: 172 mg/dL — AB (ref 65–99)
GLUCOSE-CAPILLARY: 187 mg/dL — AB (ref 65–99)
GLUCOSE-CAPILLARY: 211 mg/dL — AB (ref 65–99)
Glucose-Capillary: 143 mg/dL — ABNORMAL HIGH (ref 65–99)

## 2017-04-22 MED ORDER — IPRATROPIUM-ALBUTEROL 0.5-2.5 (3) MG/3ML IN SOLN
3.0000 mL | Freq: Three times a day (TID) | RESPIRATORY_TRACT | Status: DC
  Administered 2017-04-22 – 2017-04-24 (×8): 3 mL via RESPIRATORY_TRACT
  Filled 2017-04-22 (×9): qty 3

## 2017-04-22 MED ORDER — POTASSIUM CHLORIDE CRYS ER 20 MEQ PO TBCR
40.0000 meq | EXTENDED_RELEASE_TABLET | Freq: Once | ORAL | Status: AC
  Administered 2017-04-22: 40 meq via ORAL
  Filled 2017-04-22: qty 2

## 2017-04-22 MED ORDER — PREDNISONE 10 MG PO TABS
10.0000 mg | ORAL_TABLET | Freq: Every day | ORAL | Status: DC
Start: 2017-04-23 — End: 2017-04-23
  Filled 2017-04-22 (×2): qty 1

## 2017-04-22 MED ORDER — LORAZEPAM 0.5 MG PO TABS
0.5000 mg | ORAL_TABLET | Freq: Four times a day (QID) | ORAL | Status: DC | PRN
  Administered 2017-04-22: 0.5 mg via ORAL
  Filled 2017-04-22: qty 1

## 2017-04-22 NOTE — Progress Notes (Signed)
Patient transferred from ICU now on Unit 300 room 320. Patient stable. Telemetry box 7.

## 2017-04-22 NOTE — Progress Notes (Signed)
Subjective: He is overall about the same. He was extremely weak and had PT evaluation who recommended skilled care facility. He was seen by speech therapy who felt that he had mild aspiration risk.  Objective: Vital signs in last 24 hours: Temp:  [97.4 F (36.3 C)-98 F (36.7 C)] 97.9 F (36.6 C) (08/18 0749) Pulse Rate:  [56-224] 60 (08/18 0749) Resp:  [12-21] 21 (08/18 0749) BP: (99-133)/(49-71) 118/60 (08/18 0600) SpO2:  [88 %-98 %] 97 % (08/18 0749) Weight:  [84.4 kg (186 lb 1.1 oz)] 84.4 kg (186 lb 1.1 oz) (08/18 0357) Weight change: 2.1 kg (4 lb 10.1 oz) Last BM Date: 04/16/17  Intake/Output from previous day: 08/17 0701 - 08/18 0700 In: 2390 [P.O.:2380; I.V.:10] Out: 2300 [Urine:2300]  PHYSICAL EXAM General appearance: alert, cooperative and mild distress Resp: rhonchi bilaterally Cardio: regular rate and rhythm, S1, S2 normal, no murmur, click, rub or gallop GI: soft, non-tender; bowel sounds normal; no masses,  no organomegaly Extremities: extremities normal, atraumatic, no cyanosis or edema Skin warm and dry  Lab Results:  Results for orders placed or performed during the hospital encounter of 04/12/17 (from the past 48 hour(s))  Blood gas, arterial     Status: Abnormal   Collection Time: 04/20/17 11:50 AM  Result Value Ref Range   FIO2 40.00    Delivery systems VENTILATOR    Mode PRESSURE SUPPORT    Peep/cpap 5.0 cm H20   Pressure support 10.0 cm H20   pH, Arterial 7.446 7.350 - 7.450   pCO2 arterial 49.0 (H) 32.0 - 48.0 mmHg   pO2, Arterial 98.1 83.0 - 108.0 mmHg   Bicarbonate 31.8 (H) 20.0 - 28.0 mmol/L   Acid-Base Excess 8.8 (H) 0.0 - 2.0 mmol/L   O2 Saturation 96.5 %   Patient temperature 37.0    Collection site RIGHT RADIAL    Drawn by [id number]    Sample type ARTERIAL DRAW    Allens test (pass/fail) PASS PASS  Glucose, capillary     Status: Abnormal   Collection Time: 04/20/17 11:52 AM  Result Value Ref Range   Glucose-Capillary 165 (H) 65 - 99  mg/dL  Glucose, capillary     Status: Abnormal   Collection Time: 04/20/17  4:49 PM  Result Value Ref Range   Glucose-Capillary 113 (H) 65 - 99 mg/dL  Glucose, capillary     Status: Abnormal   Collection Time: 04/20/17  7:45 PM  Result Value Ref Range   Glucose-Capillary 243 (H) 65 - 99 mg/dL   Comment 1 Notify RN    Comment 2 Document in Chart   Glucose, capillary     Status: Abnormal   Collection Time: 04/21/17 12:03 AM  Result Value Ref Range   Glucose-Capillary 221 (H) 65 - 99 mg/dL   Comment 1 Notify RN    Comment 2 Document in Chart   Glucose, capillary     Status: Abnormal   Collection Time: 04/21/17  3:53 AM  Result Value Ref Range   Glucose-Capillary 142 (H) 65 - 99 mg/dL   Comment 1 Notify RN    Comment 2 Document in Chart   CBC     Status: Abnormal   Collection Time: 04/21/17  6:15 AM  Result Value Ref Range   WBC 7.6 4.0 - 10.5 K/uL   RBC 4.88 4.22 - 5.81 MIL/uL   Hemoglobin 13.0 13.0 - 17.0 g/dL   HCT 40.6 39.0 - 52.0 %   MCV 83.2 78.0 - 100.0 fL   MCH  26.6 26.0 - 34.0 pg   MCHC 32.0 30.0 - 36.0 g/dL   RDW 17.8 (H) 11.5 - 15.5 %   Platelets 194 150 - 400 K/uL  Comprehensive metabolic panel     Status: Abnormal   Collection Time: 04/21/17  6:15 AM  Result Value Ref Range   Sodium 145 135 - 145 mmol/L   Potassium 3.5 3.5 - 5.1 mmol/L   Chloride 101 101 - 111 mmol/L   CO2 37 (H) 22 - 32 mmol/L   Glucose, Bld 127 (H) 65 - 99 mg/dL   BUN 49 (H) 6 - 20 mg/dL   Creatinine, Ser 0.67 0.61 - 1.24 mg/dL   Calcium 8.5 (L) 8.9 - 10.3 mg/dL   Total Protein 5.3 (L) 6.5 - 8.1 g/dL   Albumin 2.6 (L) 3.5 - 5.0 g/dL   AST 30 15 - 41 U/L   ALT 54 17 - 63 U/L   Alkaline Phosphatase 47 38 - 126 U/L   Total Bilirubin 0.9 0.3 - 1.2 mg/dL   GFR calc non Af Amer >60 >60 mL/min   GFR calc Af Amer >60 >60 mL/min    Comment: (NOTE) The eGFR has been calculated using the CKD EPI equation. This calculation has not been validated in all clinical situations. eGFR's persistently  <60 mL/min signify possible Chronic Kidney Disease.    Anion gap 7 5 - 15  Glucose, capillary     Status: Abnormal   Collection Time: 04/21/17  8:36 AM  Result Value Ref Range   Glucose-Capillary 138 (H) 65 - 99 mg/dL  Glucose, capillary     Status: Abnormal   Collection Time: 04/21/17 11:39 AM  Result Value Ref Range   Glucose-Capillary 311 (H) 65 - 99 mg/dL  Glucose, capillary     Status: Abnormal   Collection Time: 04/21/17  4:01 PM  Result Value Ref Range   Glucose-Capillary 303 (H) 65 - 99 mg/dL  Glucose, capillary     Status: Abnormal   Collection Time: 04/21/17  7:57 PM  Result Value Ref Range   Glucose-Capillary 334 (H) 65 - 99 mg/dL  Glucose, capillary     Status: Abnormal   Collection Time: 04/21/17 11:30 PM  Result Value Ref Range   Glucose-Capillary 217 (H) 65 - 99 mg/dL   Comment 1 Notify RN    Comment 2 Document in Chart   CBC     Status: Abnormal   Collection Time: 04/22/17  4:30 AM  Result Value Ref Range   WBC 7.8 4.0 - 10.5 K/uL   RBC 4.90 4.22 - 5.81 MIL/uL   Hemoglobin 12.9 (L) 13.0 - 17.0 g/dL   HCT 40.7 39.0 - 52.0 %   MCV 83.1 78.0 - 100.0 fL   MCH 26.3 26.0 - 34.0 pg   MCHC 31.7 30.0 - 36.0 g/dL   RDW 17.7 (H) 11.5 - 15.5 %   Platelets 188 150 - 400 K/uL  Comprehensive metabolic panel     Status: Abnormal   Collection Time: 04/22/17  4:30 AM  Result Value Ref Range   Sodium 141 135 - 145 mmol/L   Potassium 2.8 (L) 3.5 - 5.1 mmol/L    Comment: DELTA CHECK NOTED   Chloride 99 (L) 101 - 111 mmol/L   CO2 36 (H) 22 - 32 mmol/L   Glucose, Bld 162 (H) 65 - 99 mg/dL   BUN 39 (H) 6 - 20 mg/dL   Creatinine, Ser 0.60 (L) 0.61 - 1.24 mg/dL   Calcium 8.0 (  L) 8.9 - 10.3 mg/dL   Total Protein 4.8 (L) 6.5 - 8.1 g/dL   Albumin 2.5 (L) 3.5 - 5.0 g/dL   AST 25 15 - 41 U/L   ALT 53 17 - 63 U/L   Alkaline Phosphatase 47 38 - 126 U/L   Total Bilirubin 0.8 0.3 - 1.2 mg/dL   GFR calc non Af Amer >60 >60 mL/min   GFR calc Af Amer >60 >60 mL/min    Comment:  (NOTE) The eGFR has been calculated using the CKD EPI equation. This calculation has not been validated in all clinical situations. eGFR's persistently <60 mL/min signify possible Chronic Kidney Disease.    Anion gap 6 5 - 15  Glucose, capillary     Status: Abnormal   Collection Time: 04/22/17  7:41 AM  Result Value Ref Range   Glucose-Capillary 143 (H) 65 - 99 mg/dL   Comment 1 Notify RN    Comment 2 Document in Chart     ABGS  Recent Labs  04/20/17 1150  PHART 7.446  PO2ART 98.1  HCO3 31.8*   CULTURES Recent Results (from the past 240 hour(s))  MRSA PCR Screening     Status: None   Collection Time: 04/12/17  3:06 PM  Result Value Ref Range Status   MRSA by PCR NEGATIVE NEGATIVE Final    Comment:        The GeneXpert MRSA Assay (FDA approved for NASAL specimens only), is one component of a comprehensive MRSA colonization surveillance program. It is not intended to diagnose MRSA infection nor to guide or monitor treatment for MRSA infections.   Culture, respiratory (NON-Expectorated)     Status: None   Collection Time: 04/14/17  7:49 AM  Result Value Ref Range Status   Specimen Description TRACHEAL ASPIRATE  Final   Special Requests NONE  Final   Gram Stain   Final    ABUNDANT WBC PRESENT, PREDOMINANTLY PMN RARE SQUAMOUS EPITHELIAL CELLS PRESENT ABUNDANT GRAM POSITIVE RODS Performed at [facility], [address], [place], [place] [address]    Culture   Final    ABUNDANT CORYNEBACTERIUM STRIATUM MODERATE ENTEROBACTER SPECIES    Report Status 04/18/2017 FINAL  Final   Organism ID, Bacteria ENTEROBACTER SPECIES  Final      Susceptibility   Enterobacter species - MIC*    CEFAZOLIN >=64 RESISTANT Resistant     CEFEPIME <=1 SENSITIVE Sensitive     CEFTAZIDIME <=1 SENSITIVE Sensitive     CEFTRIAXONE <=1 SENSITIVE Sensitive     CIPROFLOXACIN <=0.25 SENSITIVE Sensitive     GENTAMICIN <=1 SENSITIVE Sensitive     IMIPENEM <=0.25 SENSITIVE Sensitive      TRIMETH/SULFA <=20 SENSITIVE Sensitive     PIP/TAZO <=4 SENSITIVE Sensitive     * MODERATE ENTEROBACTER SPECIES   Studies/Results: Dg Chest Port 1 View  Result Date: 04/21/2017 CLINICAL DATA:  Respiratory failure EXAM: PORTABLE CHEST 1 VIEW COMPARISON:  04/20/17 FINDINGS: Cardiac shadow is mildly enlarged. Right-sided PICC line is noted in the mid superior vena cava. The endotracheal tube and nasogastric catheter have been removed in the interval. Mild left basilar atelectasis is noted. Mild vascular congestion is seen. IMPRESSION: The slight increase in left basilar atelectasis. Mild vascular congestion. Electronically Signed   By: Inez Catalina M.D.   On: 04/21/2017 07:17   Dg Chest Port 1 View  Result Date: 04/20/2017 CLINICAL DATA:  Respiratory failure, intubation EXAM: PORTABLE CHEST 1 VIEW COMPARISON:  Portable chest x-ray of 04/19/2017 FINDINGS: The  tip of the endotracheal tube is approximately 8.6 cm above the carina. Aeration of the lungs is relatively stable with minimal linear atelectasis at the left lung base. No focal pneumonia or effusion is seen. Right PICC line tip overlies the mid SVC. No pneumothorax is seen. Heart size is stable being mildly enlarged. IMPRESSION: 1. No change in aeration with mild linear atelectasis at the left lung base. 2. Tip of endotracheal tube approximately 8.6 cm above the carina. 3. Right PICC line tip overlies the mid SVC Electronically Signed   By: Ivar Drape M.D.   On: 04/20/2017 08:39    Medications:  Prior to Admission:  Prescriptions Prior to Admission  Medication Sig Dispense Refill Last Dose  . albuterol (PROVENTIL HFA;VENTOLIN HFA) 108 (90 BASE) MCG/ACT inhaler Inhale 2 puffs into the lungs every 4 (four) hours as needed for wheezing.    Taking  . albuterol (PROVENTIL) (2.5 MG/3ML) 0.083% nebulizer solution Take 2.5 mg by nebulization every 4 (four) hours as needed for wheezing or shortness of breath.   Taking  . aspirin 325 MG tablet Take 325  mg by mouth daily.   04/11/2017 at Unknown time  . Cyanocobalamin (VITAMIN B-12) 2500 MCG SUBL Place 2,500 mcg under the tongue daily.   04/11/2017 at Unknown time  . cyclobenzaprine (FLEXERIL) 10 MG tablet Take 5 mg by mouth at bedtime.   04/11/2017 at Unknown time  . diltiazem (TIAZAC) 300 MG 24 hr capsule Take 300 mg by mouth daily.    04/11/2017 at Unknown time  . diphenhydrAMINE (BENADRYL) 25 mg capsule Take 25 mg by mouth at bedtime as needed.   04/11/2017 at Unknown time  . furosemide (LASIX) 20 MG tablet Take 40 mg by mouth daily.    04/11/2017 at Unknown time  . gabapentin (NEURONTIN) 400 MG capsule Take 400 mg by mouth 4 (four) times daily.   04/11/2017 at Unknown time  . glipiZIDE (GLUCOTROL) 5 MG tablet Take 5 mg by mouth daily before breakfast.   04/11/2017 at Unknown time  . ipratropium-albuterol (DUONEB) 0.5-2.5 (3) MG/3ML SOLN Take 3 mLs by nebulization QID.    04/11/2017 at Unknown time  . metoCLOPramide (REGLAN) 10 MG tablet Take 10 mg by mouth 4 (four) times daily.   04/11/2017 at Unknown time  . montelukast (SINGULAIR) 10 MG tablet Take 10 mg by mouth daily.   04/11/2017 at Unknown time  . omeprazole (PRILOSEC) 40 MG capsule Take 40 mg by mouth daily.   04/11/2017 at Unknown time  . rosuvastatin (CRESTOR) 20 MG tablet Take 1 tablet (20 mg total) by mouth daily. 30 tablet 3 04/11/2017 at Unknown time  . saxagliptin HCl (ONGLYZA) 5 MG TABS tablet Take 5 mg by mouth daily.   04/11/2017 at Unknown time  . tiZANidine (ZANAFLEX) 4 MG tablet Take 4 mg by mouth 3 (three) times daily.   04/11/2017 at Unknown time  . umeclidinium-vilanterol (ANORO ELLIPTA) 62.5-25 MCG/INH AEPB Inhale 1 puff into the lungs daily.   04/11/2017 at Unknown time  . cefdinir (OMNICEF) 300 MG capsule Take 300 mg by mouth 2 (two) times daily.   Completed Course at Unknown time  . insulin aspart (NOVOLOG) 100 UNIT/ML injection Per sliding scale   Taking  . metFORMIN (GLUMETZA) 1000 MG (MOD) 24 hr tablet Take 1,000 mg by mouth daily with  breakfast.   Not Taking at Unknown time   Scheduled: . aspirin  325 mg Oral Daily  . chlorhexidine gluconate (MEDLINE KIT)  15 mL Mouth Rinse  BID  . Chlorhexidine Gluconate Cloth  6 each Topical Daily  . doxycycline  100 mg Oral Q12H  . enoxaparin (LOVENOX) injection  40 mg Subcutaneous Q24H  . feeding supplement (PRO-STAT SUGAR FREE 64)  30 mL Per Tube BID  . insulin aspart  0-9 Units Subcutaneous TID WC  . insulin glargine  5 Units Subcutaneous QHS  . ipratropium-albuterol  3 mL Nebulization TID  . mouth rinse  15 mL Mouth Rinse QID  . nicotine  14 mg Transdermal Daily  . pantoprazole  40 mg Oral Daily  . predniSONE  30 mg Oral Q breakfast  . sodium chloride flush  10-40 mL Intracatheter Q12H   Continuous:  [id number] **OR** acetaminophen, albuterol, hydrALAZINE, ondansetron **OR** ondansetron (ZOFRAN) IV, senna-docusate, sodium chloride flush  Assesment: He was admitted with acute hypoxic and hypercapnic respiratory failure which eventually required ventilator support. He has COPD at baseline which is pretty severe. He had pneumonia presumably from aspiration during his time of altered mental status. That has cleared by chest x-ray. 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 Pulmonary wise he looks much better Principal Problem:   Acute respiratory failure with hypoxia and hypercapnia (HCC) Active Problems:   Diabetes mellitus ([place])   Hyperlipidemia   TOBACCO ABUSE   Essential hypertension   ATRIAL FIBRILLATION   CHRONIC OBSTRUCTIVE PULMONARY DISEASE, ACUTE EXACERBATION    Plan: He has orders to transfer from stepdown. Prednisone is being tapered. I will plan to follow more peripherally. Thanks for allowing me to see him with you    LOS: 10 days   Naliyah Neth L 04/22/2017, 8:02 AM

## 2017-04-22 NOTE — Progress Notes (Signed)
PROGRESS NOTE    Andrew [name]  QIO:[id number] DOB: 1940-11-27 DOA: 04/12/2017 PCP: System, Provider Not In     Brief Narrative:  76 y/o man admitted to the hospital on 8/8 due to shortness of breath and unresponsiveness presumed due to COPD with acute exacerbation. He required BiPAP upon admission and unfortunately he failed BiPAP and was intubated on the night of 8/9.   Assessment & Plan:   Principal Problem:   Acute respiratory failure with hypoxia and hypercapnia (HCC) Active Problems:   Diabetes mellitus (HCC)   Hyperlipidemia   TOBACCO ABUSE   Essential hypertension   ATRIAL FIBRILLATION   CHRONIC OBSTRUCTIVE PULMONARY DISEASE, ACUTE EXACERBATION   Acute hypoxic and hypercapnic respiratory failure 2/2 COPD/CHF : improved.  -Patient failed noninvasive therapy and was intubated on 8/9. -extubated 04/20/17. -CXR and lung exam better after given lasix . -switch to oral doxy and prednison , wean off the steroids.   COPD with acute exacerbation: improved. -Continue steroids, nebs at current dose. -See above for more details.  Tobacco abuse -Will need to discuss cessation once  extubated.  Atrial fibrillation -Rate controlled, not chronically anticoagulated, continue aspirin.  Type 2 diabetes -SSI with 5 units of basal Lantus. -Improved glycemic control.  Hypertension -Fair control, when necessary hydralazine   GERD -Continue PPI.  Hyperlipidemia -Resume statin once more alert and extubated.  -Acute illness Myopathy :   ICU related muscle weakness with steroids effect ,WEAN OFF THE STEROIDS.  SNF placement , daughter is concerned about CNS injury which is less likely as I explained to her the weakness is global and is related to acute illness , CT head will be ordered.  DVT prophylaxis: lovenox Code Status: full code Family Communication: patient only  Disposition Plan: To be determined pending medical stability  Consultants:   Pulmonary, Dr.  Luan Pulling  Procedures:   ECHO as above  Antimicrobials:  Anti-infectives    Start     Dose/Rate Route Frequency Ordered Stop   04/21/17 1000  doxycycline (VIBRA-TABS) tablet 100 mg     100 mg Oral Every 12 hours 04/21/17 0851 04/24/17 0959   04/13/17 0745  cefTRIAXone (ROCEPHIN) 1 g in dextrose 5 % 50 mL IVPB  Status:  Discontinued     1 g 100 mL/hr over 30 Minutes Intravenous Every 24 hours 04/13/17 0743 04/21/17 0846   04/12/17 1915  azithromycin (ZITHROMAX) 500 mg in dextrose 5 % 250 mL IVPB     500 mg 250 mL/hr over 60 Minutes Intravenous Daily-1800 04/12/17 1908 04/16/17 1809       Subjective: S/P successful extubation 04/20/17 , Tolerating oral intake denies any complaints other than generalized weakness mostly in the right upper extremity, had a thorough discussion with the daughter yesterday about the ICU weakness and reassured her that this is going to  improve with PT OT most likely.  Objective: Vitals:   04/22/17 0500 04/22/17 0600 04/22/17 0744 04/22/17 0749  BP: (!) 124/57 118/60    Pulse: 62 61  60  Resp: 14 14  (!) 21  Temp:    97.9 F (36.6 C)  TempSrc:    Axillary  SpO2: 97% 94% 91% 97%  Weight:      Height:        Intake/Output Summary (Last 24 hours) at 04/22/17 0914 Last data filed at 04/22/17 0400  Gross per 24 hour  Intake             1910 ml  Output  2300 ml  Net             -390 ml   Filed Weights   04/20/17 0427 04/21/17 0500 04/22/17 0357  Weight: 86.6 kg (190 lb 14.7 oz) 82.3 kg (181 lb 7 oz) 84.4 kg (186 lb 1.1 oz)    Examination:  General exam: Intubated, sedated Respiratory system: Clear to auscultation . Cardiovascular system:RRR. No murmurs, rubs, gallops. Gastrointestinal system: Abdomen is nondistended, soft and nontender. No organomegaly or masses felt. Normal bowel sounds heard. Central nervous system:All upper and lower extremities were weak 2/5 muscle strength Extremities: No C/C/E, +pedal pulses Skin: No rashes,  lesions or ulcers Psychiatry: Unable to assess given current level of sedation          Data Reviewed: I have personally reviewed following labs and imaging studies  CBC:  Recent Labs Lab 04/17/17 0409 04/18/17 0401 04/20/17 0724 04/21/17 0615 04/22/17 0430  WBC 10.0 9.9 7.7 7.6 7.8  HGB 13.0 13.6 13.5 13.0 12.9*  HCT 41.8 43.1 42.5 40.6 40.7  MCV 83.4 83.2 83.3 83.2 83.1  PLT 168 188 201 194 001   Basic Metabolic Panel:  Recent Labs Lab 04/15/17 1631  04/17/17 0409 04/18/17 0401 04/19/17 0521 04/20/17 0724 04/21/17 0615 04/22/17 0430  NA  --   < > 145 144  --  144 145 141  K  --   < > 4.2 4.7  --  3.7 3.5 2.8*  CL  --   < > 104 105  --  103 101 99*  CO2  --   < > 34* 32  --  35* 37* 36*  GLUCOSE  --   < > 152* 177*  --  208* 127* 162*  BUN  --   < > 30* 32*  --  46* 49* 39*  CREATININE  --   < > 0.69 0.62 0.60* 0.60* 0.67 0.60*  CALCIUM  --   < > 8.6* 8.7*  --  8.8* 8.5* 8.0*  MG 2.2  --   --   --   --   --   --   --   PHOS 3.4  --   --   --   --   --   --   --   < > = values in this interval not displayed. GFR: Estimated Creatinine Clearance: 81.1 mL/min (A) (by C-G formula based on SCr of 0.6 mg/dL (L)). Liver Function Tests:  Recent Labs Lab 04/20/17 0724 04/21/17 0615 04/22/17 0430  AST 32 30 25  ALT 54 54 53  ALKPHOS 46 47 47  BILITOT 0.5 0.9 0.8  PROT 5.3* 5.3* 4.8*  ALBUMIN 2.6* 2.6* 2.5*   No results for input(s): LIPASE, AMYLASE in the last 168 hours. No results for input(s): AMMONIA in the last 168 hours. Coagulation Profile: No results for input(s): INR, PROTIME in the last 168 hours. Cardiac Enzymes: No results for input(s): CKTOTAL, CKMB, CKMBINDEX, TROPONINI in the last 168 hours. BNP (last 3 results) No results for input(s): PROBNP in the last 8760 hours. HbA1C: No results for input(s): HGBA1C in the last 72 hours. CBG:  Recent Labs Lab 04/21/17 1139 04/21/17 1601 04/21/17 1957 04/21/17 2330 04/22/17 0741  GLUCAP  311* 303* 334* 217* 143*   Lipid Profile:  Recent Labs  04/19/17 2221  TRIG 156*   Thyroid Function Tests: No results for input(s): TSH, T4TOTAL, FREET4, T3FREE, THYROIDAB in the last 72 hours. Anemia Panel: No results for input(s): VITAMINB12, FOLATE, FERRITIN,  TIBC, IRON, RETICCTPCT in the last 72 hours. Urine analysis:    Component Value Date/Time   COLORURINE AMBER (A) 05/05/2013 1621   APPEARANCEUR CLEAR 05/05/2013 1621   LABSPEC 1.025 05/05/2013 1621   PHURINE 5.5 05/05/2013 1621   GLUCOSEU NEGATIVE 05/05/2013 1621   HGBUR NEGATIVE 05/05/2013 1621   HGBUR negative 03/31/2009 1514   BILIRUBINUR SMALL (A) 05/05/2013 1621   KETONESUR NEGATIVE 05/05/2013 1621   PROTEINUR NEGATIVE 05/05/2013 1621   UROBILINOGEN 1.0 05/05/2013 1621   NITRITE NEGATIVE 05/05/2013 1621   LEUKOCYTESUR NEGATIVE 05/05/2013 1621   Sepsis Labs: '@LABRCNTIP' (procalcitonin:4,lacticidven:4)  ) Recent Results (from the past 240 hour(s))  MRSA PCR Screening     Status: None   Collection Time: 04/12/17  3:06 PM  Result Value Ref Range Status   MRSA by PCR NEGATIVE NEGATIVE Final    Comment:        The GeneXpert MRSA Assay (FDA approved for NASAL specimens only), is one component of a comprehensive MRSA colonization surveillance program. It is not intended to diagnose MRSA infection nor to guide or monitor treatment for MRSA infections.   Culture, respiratory (NON-Expectorated)     Status: None   Collection Time: 04/14/17  7:49 AM  Result Value Ref Range Status   Specimen Description TRACHEAL ASPIRATE  Final   Special Requests NONE  Final   Gram Stain   Final    ABUNDANT WBC PRESENT, PREDOMINANTLY PMN RARE SQUAMOUS EPITHELIAL CELLS PRESENT ABUNDANT GRAM POSITIVE RODS Performed at [facility], [address], [place], [place] [address]    Culture   Final    ABUNDANT CORYNEBACTERIUM STRIATUM MODERATE ENTEROBACTER SPECIES    Report Status 04/18/2017 FINAL  Final   Organism ID,  Bacteria ENTEROBACTER SPECIES  Final      Susceptibility   Enterobacter species - MIC*    CEFAZOLIN >=64 RESISTANT Resistant     CEFEPIME <=1 SENSITIVE Sensitive     CEFTAZIDIME <=1 SENSITIVE Sensitive     CEFTRIAXONE <=1 SENSITIVE Sensitive     CIPROFLOXACIN <=0.25 SENSITIVE Sensitive     GENTAMICIN <=1 SENSITIVE Sensitive     IMIPENEM <=0.25 SENSITIVE Sensitive     TRIMETH/SULFA <=20 SENSITIVE Sensitive     PIP/TAZO <=4 SENSITIVE Sensitive     * MODERATE ENTEROBACTER SPECIES         Radiology Studies: Dg Chest Port 1 View  Result Date: 04/21/2017 CLINICAL DATA:  Respiratory failure EXAM: PORTABLE CHEST 1 VIEW COMPARISON:  04/20/17 FINDINGS: Cardiac shadow is mildly enlarged. Right-sided PICC line is noted in the mid superior vena cava. The endotracheal tube and nasogastric catheter have been removed in the interval. Mild left basilar atelectasis is noted. Mild vascular congestion is seen. IMPRESSION: The slight increase in left basilar atelectasis. Mild vascular congestion. Electronically Signed   By: Inez Catalina M.D.   On: 04/21/2017 07:17        Scheduled Meds: . aspirin  325 mg Oral Daily  . chlorhexidine gluconate (MEDLINE KIT)  15 mL Mouth Rinse BID  . Chlorhexidine Gluconate Cloth  6 each Topical Daily  . doxycycline  100 mg Oral Q12H  . enoxaparin (LOVENOX) injection  40 mg Subcutaneous Q24H  . feeding supplement (PRO-STAT SUGAR FREE 64)  30 mL Per Tube BID  . insulin aspart  0-9 Units Subcutaneous TID WC  . insulin glargine  5 Units Subcutaneous QHS  . ipratropium-albuterol  3 mL Nebulization TID  . mouth rinse  15 mL Mouth Rinse QID  . nicotine  14 mg Transdermal Daily  . pantoprazole  40 mg Oral Daily  . [START ON 04/23/2017] predniSONE  10 mg Oral Q breakfast  . sodium chloride flush  10-40 mL Intracatheter Q12H   Continuous Infusions:    LOS: 10 days    Critical time spent: 45 minutes. Greater than 50% of this time was spent in direct contact with the  patient coordinating care.     Waldron Session, MD [facility] Pager [phone]  If 7PM-7AM, please contact night-coverage [email or web address] Password [facility] 04/22/2017, 9:14 AM

## 2017-04-22 NOTE — Progress Notes (Signed)
Timmothy Euler RN removed PICC per order. Held pressure and placed pressure dressing. Tolerated well. Transfering to room 320 telemetry.

## 2017-04-22 NOTE — Plan of Care (Signed)
Problem: Activity: Goal: Risk for activity intolerance will decrease Outcome: Progressing Pt remains very weak but it moving extremities more during shift. Patient participated in bath and helped staff turn him and lifted arms and legs when prompted. Patient understands importance of regaining strength.

## 2017-04-23 LAB — GLUCOSE, CAPILLARY
GLUCOSE-CAPILLARY: 115 mg/dL — AB (ref 65–99)
GLUCOSE-CAPILLARY: 176 mg/dL — AB (ref 65–99)
Glucose-Capillary: 189 mg/dL — ABNORMAL HIGH (ref 65–99)
Glucose-Capillary: 154 mg/dL — ABNORMAL HIGH (ref 65–99)

## 2017-04-23 MED ORDER — POTASSIUM CHLORIDE 10 MEQ/100ML IV SOLN
10.0000 meq | INTRAVENOUS | Status: AC
  Administered 2017-04-23 (×2): 10 meq via INTRAVENOUS
  Filled 2017-04-23 (×2): qty 100

## 2017-04-23 MED ORDER — POTASSIUM CHLORIDE CRYS ER 20 MEQ PO TBCR
40.0000 meq | EXTENDED_RELEASE_TABLET | Freq: Once | ORAL | Status: AC
  Administered 2017-04-23: 40 meq via ORAL
  Filled 2017-04-23: qty 2

## 2017-04-23 MED ORDER — APIXABAN 2.5 MG PO TABS
2.5000 mg | ORAL_TABLET | Freq: Two times a day (BID) | ORAL | Status: DC
  Administered 2017-04-23 – 2017-04-24 (×3): 2.5 mg via ORAL
  Filled 2017-04-23 (×3): qty 1

## 2017-04-23 MED ORDER — PREDNISONE 10 MG PO TABS: 5.0000 mg | ORAL_TABLET | Freq: Every day | ORAL | Status: DC

## 2017-04-23 MED ORDER — POTASSIUM CITRATE ER 10 MEQ (1080 MG) PO TBCR
40.0000 meq | EXTENDED_RELEASE_TABLET | Freq: Once | ORAL | Status: DC
  Filled 2017-04-23: qty 4

## 2017-04-23 NOTE — Progress Notes (Signed)
PROGRESS NOTE    Andrew [name]  YJE:[id number] DOB: 1941-01-14 DOA: 04/12/2017 PCP: System, Provider Not In     Brief Narrative:  76 y/o man admitted to the hospital on 8/8 due to shortness of breath and unresponsiveness presumed due to COPD with acute exacerbation. He required BiPAP upon admission and unfortunately he failed BiPAP and was intubated on the night of 8/9.   Assessment & Plan:   Principal Problem:   Acute respiratory failure with hypoxia and hypercapnia (HCC) Active Problems:   Diabetes mellitus (HCC)   Hyperlipidemia   TOBACCO ABUSE   Essential hypertension   ATRIAL FIBRILLATION   CHRONIC OBSTRUCTIVE PULMONARY DISEASE, ACUTE EXACERBATION   Acute hypoxic and hypercapnic respiratory failure 2/2 COPD/CHF : improved.  -Patient failed noninvasive therapy and was intubated on 8/9. -extubated 04/20/17. -CXR and lung exam better after given lasix . -switch to oral doxy and prednison , wean off the steroids.   COPD with acute exacerbation: improved.   WEAN STEROIDS more .  Tobacco abuse -Will need to discuss cessation once  extubated.  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  Type 2 diabetes -SSI with 5 units of basal Lantus. -Improved glycemic control.  Hypertension -Fair control, when necessary hydralazine   GERD -Continue PPI.  Hyperlipidemia -Resume statin once more alert and extubated.  -Acute illness Myopathy :   ICU related muscle weakness with steroids effect ,WEAN OFF THE STEROIDS.  SNF placement , daughter is concerned about CNS injury which is less likely as I explained to her the weakness is global and is related to acute illness , CT head will be ordered.  DVT prophylaxis: lovenox Code Status: full code Family Communication: patient only  Disposition Plan: To be determined pending medical stability  Consultants:    Pulmonary, Dr. Luan Pulling  Procedures:   ECHO as above  Antimicrobials:  Anti-infectives    Start     Dose/Rate Route Frequency Ordered Stop   04/21/17 1000  doxycycline (VIBRA-TABS) tablet 100 mg     100 mg Oral Every 12 hours 04/21/17 0851 04/24/17 0959   04/13/17 0745  cefTRIAXone (ROCEPHIN) 1 g in dextrose 5 % 50 mL IVPB  Status:  Discontinued     1 g 100 mL/hr over 30 Minutes Intravenous Every 24 hours 04/13/17 0743 04/21/17 0846   04/12/17 1915  azithromycin (ZITHROMAX) 500 mg in dextrose 5 % 250 mL IVPB     500 mg 250 mL/hr over 60 Minutes Intravenous Daily-1800 04/12/17 1908 04/16/17 1809       Subjective: S/P successful extubation 04/20/17 ,WEAKNESS IS IMPROVING , able to lift his upper ext now , tolerating the diet , no sob or any issues , pending rehab .  Objective: Vitals:   04/22/17 2016 04/22/17 2121 04/23/17 0458 04/23/17 0735  BP:  (!) 113/48 136/72   Pulse:  68 63   Resp:  20 20   Temp:  98.4 F (36.9 C) 97.9 F (36.6 C)   TempSrc:  Oral Oral   SpO2: 90% 97% 94% 94%  Weight:   87.2 kg (192 lb 4.8 oz)   Height:        Intake/Output Summary (Last 24 hours) at 04/23/17 0905 Last data filed at 04/23/17 0744  Gross per 24 hour  Intake              680 ml  Output  2950 ml  Net            -2270 ml   Filed Weights   04/21/17 0500 04/22/17 0357 04/23/17 0458  Weight: 82.3 kg (181 lb 7 oz) 84.4 kg (186 lb 1.1 oz) 87.2 kg (192 lb 4.8 oz)    Examination:  General exam: Intubated, sedated Respiratory system: Clear to auscultation . Cardiovascular system:RRR. No murmurs, rubs, gallops. Gastrointestinal system: Abdomen is nondistended, soft and nontender. No organomegaly or masses felt. Normal bowel sounds heard. Central nervous system:All upper and lower extremities were weak4/5 muscle strength Extremities: No C/C/E, +pedal pulses Skin: No rashes, lesions or ulcers Psychiatry: Unable to assess given current level of  sedation          Data Reviewed: I have personally reviewed following labs and imaging studies  CBC:  Recent Labs Lab 04/17/17 0409 04/18/17 0401 04/20/17 0724 04/21/17 0615 04/22/17 0430  WBC 10.0 9.9 7.7 7.6 7.8  HGB 13.0 13.6 13.5 13.0 12.9*  HCT 41.8 43.1 42.5 40.6 40.7  MCV 83.4 83.2 83.3 83.2 83.1  PLT 168 188 201 194 025   Basic Metabolic Panel:  Recent Labs Lab 04/17/17 0409 04/18/17 0401 04/19/17 0521 04/20/17 0724 04/21/17 0615 04/22/17 0430  NA 145 144  --  144 145 141  K 4.2 4.7  --  3.7 3.5 2.8*  CL 104 105  --  103 101 99*  CO2 34* 32  --  35* 37* 36*  GLUCOSE 152* 177*  --  208* 127* 162*  BUN 30* 32*  --  46* 49* 39*  CREATININE 0.69 0.62 0.60* 0.60* 0.67 0.60*  CALCIUM 8.6* 8.7*  --  8.8* 8.5* 8.0*   GFR: Estimated Creatinine Clearance: 81.1 mL/min (A) (by C-G formula based on SCr of 0.6 mg/dL (L)). Liver Function Tests:  Recent Labs Lab 04/20/17 0724 04/21/17 0615 04/22/17 0430  AST 32 30 25  ALT 54 54 53  ALKPHOS 46 47 47  BILITOT 0.5 0.9 0.8  PROT 5.3* 5.3* 4.8*  ALBUMIN 2.6* 2.6* 2.5*   No results for input(s): LIPASE, AMYLASE in the last 168 hours. No results for input(s): AMMONIA in the last 168 hours. Coagulation Profile: No results for input(s): INR, PROTIME in the last 168 hours. Cardiac Enzymes: No results for input(s): CKTOTAL, CKMB, CKMBINDEX, TROPONINI in the last 168 hours. BNP (last 3 results) No results for input(s): PROBNP in the last 8760 hours. HbA1C: No results for input(s): HGBA1C in the last 72 hours. CBG:  Recent Labs Lab 04/22/17 0741 04/22/17 1131 04/22/17 1651 04/22/17 2125 04/23/17 0734  GLUCAP 143* 172* 187* 211* 115*   Lipid Profile: No results for input(s): CHOL, HDL, LDLCALC, TRIG, CHOLHDL, LDLDIRECT in the last 72 hours. Thyroid Function Tests: No results for input(s): TSH, T4TOTAL, FREET4, T3FREE, THYROIDAB in the last 72 hours. Anemia Panel: No results for input(s): VITAMINB12,  FOLATE, FERRITIN, TIBC, IRON, RETICCTPCT in the last 72 hours. Urine analysis:    Component Value Date/Time   COLORURINE AMBER (A) 05/05/2013 1621   APPEARANCEUR CLEAR 05/05/2013 1621   LABSPEC 1.025 05/05/2013 1621   PHURINE 5.5 05/05/2013 1621   GLUCOSEU NEGATIVE 05/05/2013 1621   HGBUR NEGATIVE 05/05/2013 1621   HGBUR negative 03/31/2009 1514   BILIRUBINUR SMALL (A) 05/05/2013 1621   KETONESUR NEGATIVE 05/05/2013 1621   PROTEINUR NEGATIVE 05/05/2013 1621   UROBILINOGEN 1.0 05/05/2013 1621   NITRITE NEGATIVE 05/05/2013 1621   LEUKOCYTESUR NEGATIVE 05/05/2013 1621   Sepsis Labs: '@LABRCNTIP' (procalcitonin:4,lacticidven:4)  ) Recent Results (  from the past 240 hour(s))  Culture, respiratory (NON-Expectorated)     Status: None   Collection Time: 04/14/17  7:49 AM  Result Value Ref Range Status   Specimen Description TRACHEAL ASPIRATE  Final   Special Requests NONE  Final   Gram Stain   Final    ABUNDANT WBC PRESENT, PREDOMINANTLY PMN RARE SQUAMOUS EPITHELIAL CELLS PRESENT ABUNDANT GRAM POSITIVE RODS Performed at [facility], [address] [place], [place] [address]    Culture   Final    ABUNDANT CORYNEBACTERIUM STRIATUM MODERATE ENTEROBACTER SPECIES    Report Status 04/18/2017 FINAL  Final   Organism ID, Bacteria ENTEROBACTER SPECIES  Final      Susceptibility   Enterobacter species - MIC*    CEFAZOLIN >=64 RESISTANT Resistant     CEFEPIME <=1 SENSITIVE Sensitive     CEFTAZIDIME <=1 SENSITIVE Sensitive     CEFTRIAXONE <=1 SENSITIVE Sensitive     CIPROFLOXACIN <=0.25 SENSITIVE Sensitive     GENTAMICIN <=1 SENSITIVE Sensitive     IMIPENEM <=0.25 SENSITIVE Sensitive     TRIMETH/SULFA <=20 SENSITIVE Sensitive     PIP/TAZO <=4 SENSITIVE Sensitive     * MODERATE ENTEROBACTER SPECIES         Radiology Studies: Ct Head Wo Contrast  Result Date: 04/22/2017 CLINICAL DATA:  Weakness in both hands. EXAM: CT HEAD WITHOUT CONTRAST TECHNIQUE: Contiguous axial images  were obtained from the base of the skull through the vertex without intravenous contrast. COMPARISON:  December 12, 2003 FINDINGS: Brain: No subdural, epidural, or subarachnoid hemorrhage. Cerebellum, brainstem, and basal cisterns are normal. Ventricles and sulci are unremarkable. A lacunar infarct is seen in the right corona radiata, similar since previous studies. A lacunar infarct is seen adjacent to the frontal horn of the left lateral ventricle, extending into the basal ganglia, new since 2005 but favored to be nonacute. White matter changes identified. No acute cortical ischemia or infarct. No mass effect or midline shift. Vascular: Calcified atherosclerosis is seen in the intracranial carotid arteries. Skull: Normal. Negative for fracture or focal lesion. Sinuses/Orbits: No acute finding. Other: None. IMPRESSION: 1. White matter changes and lacunar infarcts as above. The findings are mostly stable but a lacunar infarct in the left basal ganglia was not seen in 2005. However, the appearance of this lacunar infarcts suggest it is nonacute. No other acute abnormalities. Electronically Signed   By: Dorise Bullion III M.D   On: 04/22/2017 09:50        Scheduled Meds: . apixaban  2.5 mg Oral BID  . chlorhexidine gluconate (MEDLINE KIT)  15 mL Mouth Rinse BID  . Chlorhexidine Gluconate Cloth  6 each Topical Daily  . doxycycline  100 mg Oral Q12H  . feeding supplement (PRO-STAT SUGAR FREE 64)  30 mL Per Tube BID  . insulin aspart  0-9 Units Subcutaneous TID WC  . insulin glargine  5 Units Subcutaneous QHS  . ipratropium-albuterol  3 mL Nebulization TID  . mouth rinse  15 mL Mouth Rinse QID  . nicotine  14 mg Transdermal Daily  . pantoprazole  40 mg Oral Daily  . potassium citrate  40 mEq Oral Once  . predniSONE  10 mg Oral Q breakfast  . sodium chloride flush  10-40 mL Intracatheter Q12H   Continuous Infusions:    LOS: 11 days    Critical time spent: 45 minutes. Greater than 50% of this time  was spent in direct contact with the patient coordinating care.  Waldron Session, MD [facility] Pager [phone]  If 7PM-7AM, please contact night-coverage [email or web address] Password [email or web address] 04/23/2017, 9:05 AM

## 2017-04-24 DIAGNOSIS — J449 Chronic obstructive pulmonary disease, unspecified: Secondary | ICD-10-CM

## 2017-04-24 DIAGNOSIS — E785 Hyperlipidemia, unspecified: Secondary | ICD-10-CM

## 2017-04-24 LAB — GLUCOSE, CAPILLARY
GLUCOSE-CAPILLARY: 137 mg/dL — AB (ref 65–99)
Glucose-Capillary: 134 mg/dL — ABNORMAL HIGH (ref 65–99)
Glucose-Capillary: 166 mg/dL — ABNORMAL HIGH (ref 65–99)

## 2017-04-24 MED ORDER — APIXABAN 2.5 MG PO TABS: 2.5000 mg | ORAL_TABLET | Freq: Two times a day (BID) | ORAL | Status: DC

## 2017-04-24 MED ORDER — NICOTINE 14 MG/24HR TD PT24: 14.0000 mg | MEDICATED_PATCH | Freq: Every day | TRANSDERMAL | 0 refills | Status: DC

## 2017-04-24 MED ORDER — SENNOSIDES-DOCUSATE SODIUM 8.6-50 MG PO TABS: 1.0000 | ORAL_TABLET | Freq: Every evening | ORAL | Status: DC | PRN

## 2017-04-24 NOTE — Progress Notes (Signed)
Report called and given to [place], [place].

## 2017-04-24 NOTE — NC FL2 (Signed)
[place] LEVEL OF CARE SCREENING TOOL     IDENTIFICATION  Patient Name: Andrew [name] Birthdate: [birth year] Sex: male Admission Date (Current Location): 04/12/2017  [facility] and [place] Number:  [employer] and Address:  [facility] [address], [place]      Provider Number: [id number]  Attending Physician Name and Address:  Waldron Session, MD  Relative Name and Phone Number:       Current Level of Care: Hospital Recommended Level of Care: [place] Prior Approval Number:    Date Approved/Denied:   PASRR Number:    Discharge Plan: Home    Current Diagnoses: Patient Active Problem List   Diagnosis Date Noted  . Acute respiratory failure with hypoxia and hypercapnia ([place]) 04/12/2017  . Pneumonia 10/24/2016  . AAA (abdominal aortic aneurysm) ([place]) 01/04/2011  . Dizziness, nonspecific 12/26/2010  . UNSPECIFIED PERIPHERAL VASCULAR DISEASE 09/23/2010  . LEG CRAMPS 09/13/2010  . Diabetes mellitus ([place]) 02/11/2010  . CHRONIC OBSTRUCTIVE PULMONARY DISEASE, ACUTE EXACERBATION 02/11/2010  . OLECRANON BURSITIS, RIGHT 01/26/2010  . PENILE PAIN 12/29/2009  . ABNORMAL EJACULATION 05/05/2009  . LARYNGITIS, ACUTE 04/21/2009  . ATRIAL FIBRILLATION 04/08/2009  . PALPITATIONS 04/08/2009  . FATIGUE 03/31/2009  . LIBIDO, DECREASED 03/31/2009  . HYPERTROPHY PROSTATE W/UR OBST & OTH LUTS 03/25/2009  . ACUTE PROSTATITIS 03/25/2009  . SMALL BOWEL OBSTRUCTION, HX OF 11/28/2007  . Hyperlipidemia 11/13/2007  . COPD 10/02/2007  . LOW BACK PAIN, CHRONIC 10/02/2007  . ADENOMATOUS COLONIC POLYP 05/22/2007  . ESOPHAGEAL STRICTURE 05/22/2007  . DIVERTICULOSIS, COLON 05/22/2007  . TOBACCO ABUSE 05/08/2007  . Essential hypertension 05/08/2007  . CORONARY ARTERY DISEASE 05/08/2007  . ASTHMA 05/08/2007  . GERD 05/08/2007  . RENAL CALCULUS, HX OF 05/08/2007  . ABSCESS, PERIRECTAL, HX OF 05/08/2007  . ARTHRITIS, HX OF 05/08/2007   . PENILE PROSTHESIS 05/08/2007  . LAMINECTOMY, LUMBAR, HX OF 05/08/2007    Orientation RESPIRATION BLADDER Height & Weight     Self, Time, Situation, Place  O2 (2L) Continent Weight: 194 lb 6.4 oz (88.2 kg) Height:  _0  (180.3 cm)  BEHAVIORAL SYMPTOMS/MOOD NEUROLOGICAL BOWEL NUTRITION STATUS      Continent    AMBULATORY STATUS COMMUNICATION OF NEEDS Skin   Extensive Assist   Normal                       Personal Care Assistance Level of Assistance  Bathing, Feeding, Dressing Bathing Assistance: Maximum assistance Feeding assistance: Limited assistance Dressing Assistance: Maximum assistance     Functional Limitations Info  Sight, Hearing, Speech Sight Info: Adequate Hearing Info: Adequate Speech Info: Adequate    SPECIAL CARE FACTORS FREQUENCY  OT (By licensed OT), PT (By licensed PT)     PT Frequency: 5x/week OT Frequency: 3x/week            Contractures Contractures Info: Not present    Additional Factors Info  Code Status, Allergies Code Status Info: Full Code Allergies Info: Procaine, Procaine Hcl, Atorvastatin,            Current Medications (04/24/2017):  This is the current hospital active medication list Current Facility-Administered Medications  Medication Dose Route Frequency Provider Last Rate Last Dose  . acetaminophen (TYLENOL) tablet 650 mg  650 mg Oral Q6H PRN Isaac Bliss, Rayford Halsted, MD   650 mg at 04/24/17 2542   Or  . acetaminophen (TYLENOL) suppository 650 mg  650 mg Rectal Q6H PRN Isaac Bliss,  Rayford Halsted, MD      . albuterol (PROVENTIL) (2.5 MG/3ML) 0.083% nebulizer solution 2.5 mg  2.5 mg Nebulization Q2H PRN Isaac Bliss, Rayford Halsted, MD   2.5 mg at 04/16/17 0013  . apixaban (ELIQUIS) tablet 2.5 mg  2.5 mg Oral BID Waldron Session, MD   2.5 mg at 04/24/17 1255  . chlorhexidine gluconate (MEDLINE KIT) (PERIDEX) 0.12 % solution 15 mL  15 mL Mouth Rinse BID Isaac Bliss, Rayford Halsted, MD   15 mL at 04/22/17 2000  . feeding  supplement (PRO-STAT SUGAR FREE 64) liquid 30 mL  30 mL Per Tube BID Sinda Du, MD   30 mL at 04/23/17 0933  . hydrALAZINE (APRESOLINE) injection 10 mg  10 mg Intravenous Q6H PRN Isaac Bliss, Rayford Halsted, MD   10 mg at 04/19/17 2111  . insulin aspart (novoLOG) injection 0-9 Units  0-9 Units Subcutaneous TID WC Waldron Session, MD   2 Units at 04/24/17 1255  . insulin glargine (LANTUS) injection 5 Units  5 Units Subcutaneous QHS Isaac Bliss, Rayford Halsted, MD   5 Units at 04/23/17 2157  . ipratropium-albuterol (DUONEB) 0.5-2.5 (3) MG/3ML nebulizer solution 3 mL  3 mL Nebulization TID Waldron Session, MD   3 mL at 04/24/17 0739  . MEDLINE mouth rinse  15 mL Mouth Rinse QID Isaac Bliss, Rayford Halsted, MD   15 mL at 04/22/17 2211  . nicotine (NICODERM CQ - dosed in mg/24 hours) patch 14 mg  14 mg Transdermal Daily Rise Patience, MD   14 mg at 04/24/17 7939  . ondansetron (ZOFRAN) tablet 4 mg  4 mg Oral Q6H PRN Isaac Bliss, Rayford Halsted, MD       Or  . ondansetron [facility]) injection 4 mg  4 mg Intravenous Q6H PRN Isaac Bliss, Rayford Halsted, MD      . pantoprazole (PROTONIX) EC tablet 40 mg  40 mg Oral Daily Waldron Session, MD   40 mg at 04/24/17 0907  . senna-docusate (Senokot-S) tablet 1 tablet  1 tablet Oral QHS PRN Isaac Bliss, Rayford Halsted, MD   1 tablet at 04/21/17 [phone]  . sodium chloride flush (NS) 0.9 % injection 10-40 mL  10-40 mL Intracatheter Q12H Waldron Session, MD   10 mL at 04/23/17 2158  . sodium chloride flush (NS) 0.9 % injection 10-40 mL  10-40 mL Intracatheter PRN Waldron Session, MD         Discharge Medications: Please see discharge summary for a list of discharge medications.  Relevant Imaging Results:  Relevant Lab Results:   Additional Information SSN [id number] Clydene Pugh, LCSW

## 2017-04-24 NOTE — Evaluation (Signed)
Occupational Therapy Evaluation Patient Details Name: Andrew [name] MRN: [id number] DOB: May 28, 1941 Today's Date: 04/24/2017    History of Present Illness Andrew [name] is a 76 y.o. male with history of COPD who lives alone.  his daughter checkS on him daily, found to be weaker , on 04/12/17 was found to be unresponsive.. On 8/9 intubated. Extubated 04/20/17.   Clinical Impression   Pt in bed with nurse tech feeding him breakfast. Agreeable to participate in OT evaluation once finished. Patient motivated to do as much as he could. Pt did attempt to transfer to chair this AM. Pt presents with increased sitting balance while on EOB. Unable to bear weight on left foot this AM due to increase pain and tightness in ankle when completing dorsiflexion. Therapist did passively stretch bilateral ankles prior to standing attempt with increased tightness noted on left side. Pt complete 3 attempts to stand. Was able to bring buttocks off bed slightly with Total assist. UB ROM exercises completed while seating. Pt required several rest breaks due to fatigue. Pt will benefit from skilled OT services while in acute to focus on BUE strengthening, activity tolerance, and functional transfers allowing him to complete ADL tasks with less assistance and difficulty. Pt will benefit from discharge to SNF.     Follow Up Recommendations  SNF    Equipment Recommendations  None recommended by OT       Precautions / Restrictions Precautions Precautions: Fall Precaution Comments: Due to increased weakness Restrictions Weight Bearing Restrictions: No      Mobility Bed Mobility Overal bed mobility: Needs Assistance Bed Mobility: Rolling;Supine to Sit;Sit to Sidelying Rolling: Mod assist   Supine to sit: Mod assist;HOB elevated   Sit to sidelying: Mod assist    Transfers   Equipment used: None             General transfer comment: Transfer was attempted. patient was unable to complete.    Balance    Sitting-balance support: Single extremity supported;Feet supported Sitting balance-Leahy Scale: Good Sitting balance - Comments: Pt initially did lose sitting balance posteriorly and was able to self correct with verbal cueing and therapist placing BLEs in proper alignment to provide support.            ADL either performed or assessed with clinical judgement   ADL Overall ADL's : Needs assistance/impaired Eating/Feeding: Total assistance;Bed level Eating/Feeding Details (indicate cue type and reason): Nurse tech feeding patient upon arrival.                  Lower Body Dressing: Total assistance;Bed level             Vision Baseline Vision/History: No visual deficits Patient Visual Report: No change from baseline              Pertinent Vitals/Pain Pain Assessment: 0-10 Pain Score: 9  Pain Location: Bilateral legs Pain Descriptors / Indicators: Aching;Discomfort Pain Intervention(s): Limited activity within patient's tolerance;Monitored during session;Patient requesting pain meds-RN notified     Hand Dominance Right   Extremity/Trunk Assessment Upper Extremity Assessment Upper Extremity Assessment: RUE deficits/detail;LUE deficits/detail RUE Deficits / Details: Patient able to complete A/ROM shoulder flexion to approximately 25% range. Full elbow flexion and extension. Patient able to fully extend all digits and flex into fist. Decreased gross grasp strength.  RUE Coordination: decreased fine motor;decreased gross motor LUE Deficits / Details: Patient able to complete A/ROM shoulder flexion to just under 50% range. Full elbow flexion and extension. Patient able to  fully extend all digits and flex into fist with a decreased gross grasp strength.  LUE Coordination: decreased gross motor;decreased fine motor   Lower Extremity Assessment Lower Extremity Assessment: Defer to PT evaluation       Communication Communication Communication: No difficulties    Cognition Arousal/Alertness: Awake/alert Behavior During Therapy: WFL for tasks assessed/performed Overall Cognitive Status: Within Functional Limits for tasks assessed           Exercises General Exercises - Upper Extremity Elbow Flexion: AROM;Both;10 reps;Seated Elbow Extension: AROM;Both;10 reps;Seated Other Exercises Other Exercises: shoulder protraction; AA/ROM, 5X; both; seated on EOB.   Shoulder Instructions      Home Living Family/patient expects to be discharged to:: Private residence Living Arrangements: Alone Available Help at Discharge: Family;Available PRN/intermittently Type of Home: House                       Home Equipment: None          Prior Functioning/Environment Level of Independence: Independent        Comments: Pt reports that he used no DME and was independent with all ADL tasks.         OT Problem List: Decreased strength;Decreased range of motion;Decreased activity tolerance;Impaired balance (sitting and/or standing);Impaired UE functional use;Pain;Decreased coordination      OT Treatment/Interventions: Self-care/ADL training;Therapeutic exercise;DME and/or AE instruction;Patient/family education;Therapeutic activities;Balance training    OT Goals(Current goals can be found in the care plan section) Acute Rehab OT Goals Patient Stated Goal: To go to rehab. OT Goal Formulation: With patient Time For Goal Achievement: 05/08/17 Potential to Achieve Goals: Good  OT Frequency: Min 2X/week   Barriers to D/C: Decreased caregiver support             AM-PAC PT "6 Clicks" Daily Activity     Outcome Measure Help from another person eating meals?: Total Help from another person taking care of personal grooming?: Total Help from another person toileting, which includes using toliet, bedpan, or urinal?: Total Help from another person bathing (including washing, rinsing, drying)?: Total Help from another person to put on and taking  off regular upper body clothing?: Total Help from another person to put on and taking off regular lower body clothing?: Total 6 Click Score: 6   End of Session Equipment Utilized During Treatment: Gait belt Nurse Communication: Patient requests pain meds;Mobility status  Activity Tolerance: Patient tolerated treatment well Patient left: in bed;with call bell/phone within reach;with bed alarm set;with nursing/sitter in room  OT Visit Diagnosis: Muscle weakness (generalized) (M62.81)                Time: 5053-9767 OT Time Calculation (min): 34 min Charges:  OT General Charges $OT Visit: 1 Procedure OT Evaluation $OT Eval Low Complexity: 1 Procedure OT Treatments $Therapeutic Exercise: 23-37 mins (27') G-Codes: OT G-codes **NOT FOR INPATIENT CLASS** Functional Assessment Tool Used: AM-PAC 6 Clicks Daily Activity Functional Limitation: Self care Self Care Current Status (H4193): 100 percent impaired, limited or restricted Self Care Goal Status (X9024): At least 60 percent but less than 80 percent impaired, limited or restricted   Ailene Ravel, OTR/L,CBIS  [phone]   Tenise Stetler, Clarene Duke 04/24/2017, 10:46 AM

## 2017-04-24 NOTE — Progress Notes (Signed)
PROGRESS NOTE    Andrew [name]  GUY:[id number] DOB: 1940/09/10 DOA: 04/12/2017 PCP: System, Provider Not In     Brief Narrative:  76 y/o man admitted to the hospital on 8/8 due to shortness of breath and unresponsiveness presumed due to COPD with acute exacerbation. He required BiPAP upon admission and unfortunately he failed BiPAP and was intubated on the night of 8/9.   Assessment & Plan:   Principal Problem:   Acute respiratory failure with hypoxia and hypercapnia (HCC) Active Problems:   Diabetes mellitus (HCC)   Hyperlipidemia   TOBACCO ABUSE   Essential hypertension   ATRIAL FIBRILLATION   CHRONIC OBSTRUCTIVE PULMONARY DISEASE, ACUTE EXACERBATION   Acute hypoxic and hypercapnic respiratory failure 2/2 COPD/CHF : improved.  -Patient failed noninvasive therapy and was intubated on 8/9. -extubated successfully 04/20/17. -CXR and lung exam better after given lasix . -switch to oral doxy and prednison , wean off the steroids.   COPD with acute exacerbation: improved.   Stop the steroids and doxycycline  he completed a course of treatment.  Tobacco abuse -Will need to discuss cessation once  extubated.  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  Type 2 diabetes -SSI with 5 units of basal Lantus. -Improved glycemic control.  Hypertension -Fair control, when necessary hydralazine   GERD -Continue PPI.  Hyperlipidemia -Resume statin . -Acute illness Myopathy : Improving  ICU related muscle weakness with steroids effect ,WEAN OFF THE STEROIDS.  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 fib I put him on Eliquis 2.5 mg by mouth twice a day.    pending placement in  rehabilitation.     DVT prophylaxis: Anticoagulated Code Status: full code Family Communication: patient only  Disposition Plan: To be determined pending medical stability  Consultants:   Pulmonary, Dr. Luan Pulling  Procedures:   ECHO as above  Antimicrobials:  Anti-infectives    Start     Dose/Rate Route Frequency Ordered Stop   04/21/17 1000  doxycycline (VIBRA-TABS) tablet 100 mg  Status:  Discontinued     100 mg Oral Every 12 hours 04/21/17 0851 04/23/17 0906   04/13/17 0745  cefTRIAXone (ROCEPHIN) 1 g in dextrose 5 % 50 mL IVPB  Status:  Discontinued     1 g 100 mL/hr over 30 Minutes Intravenous Every 24 hours 04/13/17 0743 04/21/17 0846   04/12/17 1915  azithromycin (ZITHROMAX) 500 mg in dextrose 5 % 250 mL IVPB     500 mg 250 mL/hr over 60 Minutes Intravenous Daily-1800 04/12/17 1908 04/16/17 1809       Subjective:  No change or new complaints. S/P successful extubation 04/20/17 ,WEAKNESS IS IMPROVING , able to lift his upper ext now , tolerating the diet , no sob or any issues , pending rehab .  Objective: Vitals:   04/23/17 2058 04/23/17 2200 04/24/17 0544 04/24/17 0739  BP:  (!) 103/49 (!) 132/48   Pulse:  65 67   Resp:  20 20   Temp:  97.7 F (36.5 C) 98.8 F (37.1 C)   TempSrc:  Oral Oral   SpO2: 93% 97% 96% 93%  Weight:   88.2 kg (194 lb 6.4 oz)   Height:  Intake/Output Summary (Last 24 hours) at 04/24/17 0824 Last data filed at 04/24/17 0600  Gross per 24 hour  Intake              720 ml  Output             2700 ml  Net            -1980 ml   Filed Weights   04/22/17 0357 04/23/17 0458 04/24/17 0544  Weight: 84.4 kg (186 lb 1.1 oz) 87.2 kg (192 lb 4.8 oz) 88.2 kg (194 lb 6.4 oz)    Examination:  General exam: Intubated, sedated Respiratory system: Clear to auscultation . Cardiovascular system:RRR. No murmurs, rubs, gallops. Gastrointestinal system: Abdomen is nondistended, soft and nontender. No organomegaly or masses felt. Normal  bowel sounds heard. Central nervous system:All upper and lower extremities were weak4/5 muscle strength Extremities: No C/C/E, +pedal pulses Skin: No rashes, lesions or ulcers Psychiatry: Unable to assess given current level of sedation          Data Reviewed: I have personally reviewed following labs and imaging studies  CBC:  Recent Labs Lab 04/18/17 0401 04/20/17 0724 04/21/17 0615 04/22/17 0430  WBC 9.9 7.7 7.6 7.8  HGB 13.6 13.5 13.0 12.9*  HCT 43.1 42.5 40.6 40.7  MCV 83.2 83.3 83.2 83.1  PLT 188 201 194 703   Basic Metabolic Panel:  Recent Labs Lab 04/18/17 0401 04/19/17 0521 04/20/17 0724 04/21/17 0615 04/22/17 0430  NA 144  --  144 145 141  K 4.7  --  3.7 3.5 2.8*  CL 105  --  103 101 99*  CO2 32  --  35* 37* 36*  GLUCOSE 177*  --  208* 127* 162*  BUN 32*  --  46* 49* 39*  CREATININE 0.62 0.60* 0.60* 0.67 0.60*  CALCIUM 8.7*  --  8.8* 8.5* 8.0*   GFR: Estimated Creatinine Clearance: 81.1 mL/min (A) (by C-G formula based on SCr of 0.6 mg/dL (L)). Liver Function Tests:  Recent Labs Lab 04/20/17 0724 04/21/17 0615 04/22/17 0430  AST 32 30 25  ALT 54 54 53  ALKPHOS 46 47 47  BILITOT 0.5 0.9 0.8  PROT 5.3* 5.3* 4.8*  ALBUMIN 2.6* 2.6* 2.5*   No results for input(s): LIPASE, AMYLASE in the last 168 hours. No results for input(s): AMMONIA in the last 168 hours. Coagulation Profile: No results for input(s): INR, PROTIME in the last 168 hours. Cardiac Enzymes: No results for input(s): CKTOTAL, CKMB, CKMBINDEX, TROPONINI in the last 168 hours. BNP (last 3 results) No results for input(s): PROBNP in the last 8760 hours. HbA1C: No results for input(s): HGBA1C in the last 72 hours. CBG:  Recent Labs Lab 04/23/17 0734 04/23/17 1113 04/23/17 1642 04/23/17 2048 04/24/17 0738  GLUCAP 115* 176* 189* 154* 134*   Lipid Profile: No results for input(s): CHOL, HDL, LDLCALC, TRIG, CHOLHDL, LDLDIRECT in the last 72 hours. Thyroid Function  Tests: No results for input(s): TSH, T4TOTAL, FREET4, T3FREE, THYROIDAB in the last 72 hours. Anemia Panel: No results for input(s): VITAMINB12, FOLATE, FERRITIN, TIBC, IRON, RETICCTPCT in the last 72 hours. Urine analysis:    Component Value Date/Time   COLORURINE AMBER (A) 05/05/2013 1621   APPEARANCEUR CLEAR 05/05/2013 1621   LABSPEC 1.025 05/05/2013 1621   PHURINE 5.5 05/05/2013 1621   GLUCOSEU NEGATIVE 05/05/2013 1621   HGBUR NEGATIVE 05/05/2013 1621   HGBUR negative 03/31/2009 1514   BILIRUBINUR SMALL (A) 05/05/2013 1621   KETONESUR NEGATIVE 05/05/2013 1621  PROTEINUR NEGATIVE 05/05/2013 1621   UROBILINOGEN 1.0 05/05/2013 1621   NITRITE NEGATIVE 05/05/2013 1621   LEUKOCYTESUR NEGATIVE 05/05/2013 1621   Sepsis Labs: '@LABRCNTIP' (procalcitonin:4,lacticidven:4)  ) No results found for this or any previous visit (from the past 240 hour(s)).       Radiology Studies: Ct Head Wo Contrast  Result Date: 04/22/2017 CLINICAL DATA:  Weakness in both hands. EXAM: CT HEAD WITHOUT CONTRAST TECHNIQUE: Contiguous axial images were obtained from the base of the skull through the vertex without intravenous contrast. COMPARISON:  December 12, 2003 FINDINGS: Brain: No subdural, epidural, or subarachnoid hemorrhage. Cerebellum, brainstem, and basal cisterns are normal. Ventricles and sulci are unremarkable. A lacunar infarct is seen in the right corona radiata, similar since previous studies. A lacunar infarct is seen adjacent to the frontal horn of the left lateral ventricle, extending into the basal ganglia, new since 2005 but favored to be nonacute. White matter changes identified. No acute cortical ischemia or infarct. No mass effect or midline shift. Vascular: Calcified atherosclerosis is seen in the intracranial carotid arteries. Skull: Normal. Negative for fracture or focal lesion. Sinuses/Orbits: No acute finding. Other: None. IMPRESSION: 1. White matter changes and lacunar infarcts as above.  The findings are mostly stable but a lacunar infarct in the left basal ganglia was not seen in 2005. However, the appearance of this lacunar infarcts suggest it is nonacute. No other acute abnormalities. Electronically Signed   By: Dorise Bullion III M.D   On: 04/22/2017 09:50        Scheduled Meds: . apixaban  2.5 mg Oral BID  . chlorhexidine gluconate (MEDLINE KIT)  15 mL Mouth Rinse BID  . Chlorhexidine Gluconate Cloth  6 each Topical Daily  . feeding supplement (PRO-STAT SUGAR FREE 64)  30 mL Per Tube BID  . insulin aspart  0-9 Units Subcutaneous TID WC  . insulin glargine  5 Units Subcutaneous QHS  . ipratropium-albuterol  3 mL Nebulization TID  . mouth rinse  15 mL Mouth Rinse QID  . nicotine  14 mg Transdermal Daily  . pantoprazole  40 mg Oral Daily  . predniSONE  5 mg Oral Q breakfast  . sodium chloride flush  10-40 mL Intracatheter Q12H   Continuous Infusions:    LOS: 12 days    Critical time spent: 45 minutes. Greater than 50% of this time was spent in direct contact with the patient coordinating care.     Waldron Session, MD [facility] Pager [phone]  If 7PM-7AM, please contact night-coverage [email or web address] Password [email or web address] 04/24/2017, 8:24 AM

## 2017-04-24 NOTE — Discharge Summary (Signed)
Physician Discharge Summary  Andrew [name] BOF:[id number] DOB: 30-May-1941 DOA: 04/12/2017  PCP: System, Provider Not In  Admit date: 04/12/2017 Discharge date: 04/24/2017  Admitted From: (Home)  Disposition: (SNF )  Recommendations for Outpatient Follow-up:  1. Follow up with PCP in 1-2 weeks. 2. Please obtain BMP/CBC in one week.   Home Health: (NO) Equipment/Devices:(NO)  Discharge Condition: (Stable)  CODE STATUS:(FULL)  Diet recommendation: Heart Healthy / Carb Modified / Regular / Dysphagia   Brief/Interim Summary:   76 y/o man admitted to the hospital on 8/8 due to shortness of breath and unresponsiveness presumed due to COPD with acute exacerbation. He required BiPAP upon admission and unfortunately he failed BiPAP and was intubated on the night of 8/9.  He was extubated successfully 04/20/17 ,did well after the extubation , tolerated the diet with sever deconditioning that requires SNF placement . 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    Discharge Diagnoses:  Principal Problem:   Acute respiratory failure with hypoxia and hypercapnia (HCC) Active Problems:   Diabetes mellitus (HCC)   Hyperlipidemia   TOBACCO ABUSE   Essential hypertension   ATRIAL FIBRILLATION   CHRONIC OBSTRUCTIVE PULMONARY DISEASE, ACUTE EXACERBATION  Acute hypoxic and hypercapnic respiratory failure 2/2 COPD/CHF : improved.  -Patient failed noninvasive therapy and was intubated on 8/9. -extubated successfully 04/20/17. -CXR and lung exam better.   COPD with acute exacerbation: improved.   Stop the steroids and doxycycline  he completed a course of treatment.  Tobacco abuse -Will need to discuss cessation once  extubated.  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  Type 2  diabetes   Hypertension -Fair control, when necessary hydralazine   GERD -Continue PPI.  Hyperlipidemia -Resume statin . -Acute illness Myopathy : Improving  ICU related muscle weakness with steroids effect ,WEAN OFF THE STEROIDS.  SNF placemen.  Discharge Instructions  Discharge Instructions    Diet - low sodium heart healthy    Complete by:  As directed    Diet Carb Modified    Complete by:  As directed    Increase activity slowly    Complete by:  As directed      Allergies as of 04/24/2017      Reactions   Procaine Other (See Comments), Shortness Of Breath   Syncope   Procaine Hcl Anaphylaxis, Swelling   Atorvastatin Palpitations   REACTION: Heartburn      Medication List    STOP taking these medications   aspirin 325 MG tablet   cefdinir 300 MG capsule Commonly known as:  OMNICEF   cyclobenzaprine 10 MG tablet Commonly known as:  FLEXERIL   diphenhydrAMINE 25 mg capsule Commonly known as:  BENADRYL   furosemide 20 MG tablet Commonly known as:  LASIX   gabapentin 400 MG capsule Commonly known as:  NEURONTIN   metoCLOPramide 10 MG tablet Commonly known as:  REGLAN   tiZANidine 4 MG tablet Commonly known as:  ZANAFLEX     TAKE these medications   albuterol (2.5 MG/3ML) 0.083% nebulizer solution Commonly known as:  PROVENTIL Take 2.5 mg by nebulization every 4 (four) hours as needed for wheezing or shortness of breath.   albuterol 108 (90 Base) MCG/ACT inhaler Commonly known as:  PROVENTIL HFA;VENTOLIN HFA Inhale 2 puffs into the lungs every  4 (four) hours as needed for wheezing.   ANORO ELLIPTA 62.5-25 MCG/INH Aepb Generic drug:  umeclidinium-vilanterol Inhale 1 puff into the lungs daily.   apixaban 2.5 MG Tabs tablet Commonly known as:  ELIQUIS Take 1 tablet (2.5 mg total) by mouth 2 (two) times daily.   diltiazem 300 MG 24 hr capsule Commonly known as:  TIAZAC Take 300 mg by mouth daily.   glipiZIDE 5 MG tablet Commonly  known as:  GLUCOTROL Take 5 mg by mouth daily before breakfast.   ipratropium-albuterol 0.5-2.5 (3) MG/3ML Soln Commonly known as:  DUONEB Take 3 mLs by nebulization QID.   metFORMIN 1000 MG (MOD) 24 hr tablet Commonly known as:  GLUMETZA Take 1,000 mg by mouth daily with breakfast.   nicotine 14 mg/24hr patch Commonly known as:  NICODERM CQ - dosed in mg/24 hours Place 1 patch (14 mg total) onto the skin daily.   NOVOLOG 100 UNIT/ML injection Generic drug:  insulin aspart Per sliding scale   omeprazole 40 MG capsule Commonly known as:  PRILOSEC Take 40 mg by mouth daily.   rosuvastatin 20 MG tablet Commonly known as:  CRESTOR Take 1 tablet (20 mg total) by mouth daily.   saxagliptin HCl 5 MG Tabs tablet Commonly known as:  ONGLYZA Take 5 mg by mouth daily.   senna-docusate 8.6-50 MG tablet Commonly known as:  Senokot-S Take 1 tablet by mouth at bedtime as needed for mild constipation.   SINGULAIR 10 MG tablet Generic drug:  montelukast Take 10 mg by mouth daily.   Vitamin B-12 2500 MCG Subl Place 2,500 mcg under the tongue daily.       Allergies  Allergen Reactions  . Procaine Other (See Comments) and Shortness Of Breath    Syncope  . Procaine Hcl Anaphylaxis and Swelling  . Atorvastatin Palpitations    REACTION: Heartburn    Consultations:  Pulmonary    Procedures/Studies: Ct Head Wo Contrast  Result Date: 04/22/2017 CLINICAL DATA:  Weakness in both hands. EXAM: CT HEAD WITHOUT CONTRAST TECHNIQUE: Contiguous axial images were obtained from the base of the skull through the vertex without intravenous contrast. COMPARISON:  December 12, 2003 FINDINGS: Brain: No subdural, epidural, or subarachnoid hemorrhage. Cerebellum, brainstem, and basal cisterns are normal. Ventricles and sulci are unremarkable. A lacunar infarct is seen in the right corona radiata, similar since previous studies. A lacunar infarct is seen adjacent to the frontal horn of the left lateral  ventricle, extending into the basal ganglia, new since 2005 but favored to be nonacute. White matter changes identified. No acute cortical ischemia or infarct. No mass effect or midline shift. Vascular: Calcified atherosclerosis is seen in the intracranial carotid arteries. Skull: Normal. Negative for fracture or focal lesion. Sinuses/Orbits: No acute finding. Other: None. IMPRESSION: 1. White matter changes and lacunar infarcts as above. The findings are mostly stable but a lacunar infarct in the left basal ganglia was not seen in 2005. However, the appearance of this lacunar infarcts suggest it is nonacute. No other acute abnormalities. Electronically Signed   By: Dorise Bullion III M.D   On: 04/22/2017 09:50   Dg Chest Port 1 View  Result Date: 04/21/2017 CLINICAL DATA:  Respiratory failure EXAM: PORTABLE CHEST 1 VIEW COMPARISON:  04/20/17 FINDINGS: Cardiac shadow is mildly enlarged. Right-sided PICC line is noted in the mid superior vena cava. The endotracheal tube and nasogastric catheter have been removed in the interval. Mild left basilar atelectasis is noted. Mild vascular congestion is seen. IMPRESSION: The slight  increase in left basilar atelectasis. Mild vascular congestion. Electronically Signed   By: Inez Catalina M.D.   On: 04/21/2017 07:17   Dg Chest Port 1 View  Result Date: 04/20/2017 CLINICAL DATA:  Respiratory failure, intubation EXAM: PORTABLE CHEST 1 VIEW COMPARISON:  Portable chest x-ray of 04/19/2017 FINDINGS: The tip of the endotracheal tube is approximately 8.6 cm above the carina. Aeration of the lungs is relatively stable with minimal linear atelectasis at the left lung base. No focal pneumonia or effusion is seen. Right PICC line tip overlies the mid SVC. No pneumothorax is seen. Heart size is stable being mildly enlarged. IMPRESSION: 1. No change in aeration with mild linear atelectasis at the left lung base. 2. Tip of endotracheal tube approximately 8.6 cm above the carina. 3.  Right PICC line tip overlies the mid SVC Electronically Signed   By: Ivar Drape M.D.   On: 04/20/2017 08:39   Dg Chest Port 1 View  Result Date: 04/19/2017 CLINICAL DATA:  Check for PICC line placement EXAM: PORTABLE CHEST 1 VIEW COMPARISON:  04/18/2017 FINDINGS: New right-sided PICC line is noted with catheter tip in the distal superior vena cava. Endotracheal tube and nasogastric catheter are noted in satisfactory position. Cardiac shadow is stable. The lungs show some improved aeration in the bases. No new focal abnormality is seen. IMPRESSION: Status post PICC line placement in satisfactory position. Improved aeration in the bases. Electronically Signed   By: Inez Catalina M.D.   On: 04/19/2017 21:20   Dg Chest Port 1 View  Result Date: 04/18/2017 CLINICAL DATA:  Endotracheal tube placement. Respiratory failure. Unresponsive on admission. EXAM: PORTABLE CHEST 1 VIEW COMPARISON:  Earlier the same date and 04/17/2017. FINDINGS: 1645 hour. The tip of the endotracheal tube is in the mid trachea. Nasogastric tube projects below the diaphragm. A line loops over the lower left chest, probably separate from the nasogastric tube, although incompletely visualized. The heart size and mediastinal contours are stable. There are stable bibasilar pulmonary opacities, most consistent with atelectasis. No pneumothorax or significant pleural effusion. IMPRESSION: No significant changes are seen. Patchy bibasilar opacities are stable, likely atelectasis. Endotracheal tube appears satisfactorily positioned. Line projecting over the heart shadow is probably separate from the adjacent nasogastric tube, although incompletely visualized. Electronically Signed   By: Richardean Sale M.D.   On: 04/18/2017 17:15   Dg Chest Port 1 View  Result Date: 04/18/2017 CLINICAL DATA:  Acute respiratory failure with hypoxemia. EXAM: PORTABLE CHEST 1 VIEW COMPARISON:  Radiograph of April 17, 2017. FINDINGS: Stable cardiomediastinal  silhouette. Endotracheal and nasogastric tubes are unchanged in position. No pneumothorax or significant pleural effusion is noted. Stable mild central pulmonary vascular congestion is noted. Bony thorax is unremarkable. Mild bibasilar subsegmental atelectasis is noted. IMPRESSION: Stable support apparatus. Stable mild central pulmonary vascular congestion. Mild bibasilar subsegmental atelectasis. Electronically Signed   By: Marijo Conception, M.D.   On: 04/18/2017 07:34   Dg Chest Port 1 View  Result Date: 04/17/2017 CLINICAL DATA:  Respiratory failure EXAM: PORTABLE CHEST 1 VIEW COMPARISON:  Chest radiograph from one day prior. FINDINGS: Endotracheal tube tip is 3.9 cm above the carina. Enteric tube enters stomach with the tip not seen on this image. Stable cardiomediastinal silhouette with normal heart size. No pneumothorax. No pleural effusion. Patchy mild left lung base opacity is not appreciably changed. Cephalization of the pulmonary vasculature without overt pulmonary edema. IMPRESSION: 1. Well-positioned support structures. 2. Stable mild patchy left lung base opacity. Electronically Signed  By: Ilona Sorrel M.D.   On: 04/17/2017 07:31   Dg Chest Port 1 View  Result Date: 04/16/2017 CLINICAL DATA:  Check nasogastric catheter placement EXAM: PORTABLE CHEST 1 VIEW COMPARISON:  04/16/2017 FINDINGS: Endotracheal tube and nasogastric catheter are seen. The nasogastric catheter extends into the stomach. The lungs are well aerated bilaterally. Mild left basilar atelectasis is again seen. IMPRESSION: Nasogastric catheter within the stomach. Electronically Signed   By: Inez Catalina M.D.   On: 04/16/2017 12:25   Dg Chest Port 1 View  Result Date: 04/16/2017 CLINICAL DATA:  Respiratory failure EXAM: PORTABLE CHEST 1 VIEW COMPARISON:  04/15/2017 FINDINGS: Cardiac shadow is mildly enlarged but stable. Endotracheal tube is noted in satisfactory position. The nasogastric catheter has been removed in the  interval. Lungs are well aerated bilaterally. Mild left basilar atelectasis is seen. No acute bony abnormality is noted. IMPRESSION: Nasogastric catheter appears to have been removed. Left basilar atelectasis. Electronically Signed   By: Inez Catalina M.D.   On: 04/16/2017 09:42   Dg Chest Port 1 View  Result Date: 04/15/2017 CLINICAL DATA:  Acute respiratory failure EXAM: PORTABLE CHEST 1 VIEW COMPARISON:  04/15/2017 FINDINGS: Endotracheal tube and nasogastric catheter are again seen and stable. Cardiac shadow is within normal limits. The lungs are well aerated. The left base is not well evaluated on this exam. No new focal abnormality is seen. IMPRESSION: Tubes and lines as described. Somewhat limited exam with poor visualization of the left base. Electronically Signed   By: Inez Catalina M.D.   On: 04/15/2017 10:59   Dg Chest Port 1 View  Result Date: 04/15/2017 CLINICAL DATA:  Respiratory failure.  Endotracheal tube in place. EXAM: PORTABLE CHEST 1 VIEW COMPARISON:  One-view chest x-ray 04/14/2017 FINDINGS: The endotracheal tube remains 5.5 cm above the carina, in satisfactory position. The side port of the NG tube is just beyond the GE junction. Mild pulmonary vascular congestion is stable. Left greater than right basilar airspace disease remains with some improved aeration. A left pleural effusion is suspected. IMPRESSION: 1. Slight improved aeration with persistent left greater than right basilar airspace disease, likely representing atelectasis. 2. The support apparatus is stable. Electronically Signed   By: San Morelle M.D.   On: 04/15/2017 07:47   Dg Chest Port 1 View  Result Date: 04/14/2017 CLINICAL DATA:  Respiratory failure EXAM: PORTABLE CHEST 1 VIEW COMPARISON:  Chest radiograph from one day prior. FINDINGS: Endotracheal tube tip is 3.3 cm above the carina. Enteric tube enters stomach with the tip not seen on this image. Stable cardiomediastinal silhouette with mild cardiomegaly.  No pneumothorax. No pleural effusion. No overt pulmonary edema. Stable patchy consolidation and volume loss at the left lung base. IMPRESSION: 1. Well-positioned support structures.  No pneumothorax. 2. Stable patchy left lung base consolidation and volume loss, which could represent pneumonia, aspiration and/or atelectasis. Electronically Signed   By: Ilona Sorrel M.D.   On: 04/14/2017 10:49   Dg Chest Port 1 View  Result Date: 04/13/2017 CLINICAL DATA:  Respiratory failure and status post intubation. EXAM: PORTABLE CHEST 1 VIEW COMPARISON:  Film earlier today at 0600 hours FINDINGS: Endotracheal tube present with the tip approximately 6.5 cm above the carina. Gastric decompression tube extends below the diaphragm. There is substantial worsening of left lung airspace disease with reduced aeration of the left lung. Findings are likely representative of worsening pneumonia. There likely is an associated left pleural effusion. The right lung shows bronchovascular prominence and probable edema. IMPRESSION: Endotracheal tube  tip approximately 6.5 cm above the carina. Gastric decompression tube extends below the diaphragm. Significant worsening left lung airspace disease likely representative of worsening pneumonia. There is likely an associated left pleural effusion. Probable component of pulmonary edema. Electronically Signed   By: Aletta Edouard M.D.   On: 04/13/2017 22:22   Dg Chest Port 1 View  Result Date: 04/13/2017 CLINICAL DATA:  COPD EXAM: PORTABLE CHEST 1 VIEW COMPARISON:  April 12, 2017 FINDINGS: There is no appreciable edema or consolidation. There is a small left pleural effusion with slight right base atelectasis. There is cardiomegaly with pulmonary venous hypertension. No adenopathy. No bone lesions. IMPRESSION: Persistent pulmonary vascular congestion. Small left pleural effusion. Mild right base atelectasis. No frank edema or consolidation. Electronically Signed   By: Lowella Grip III M.D.    On: 04/13/2017 07:59   Dg Chest Portable 1 View  Result Date: 04/12/2017 CLINICAL DATA:  Hypoxia EXAM: PORTABLE CHEST 1 VIEW COMPARISON:  April 20, 2010 FINDINGS: There is atelectatic change in the left base. There is no frank edema or consolidation. There is cardiomegaly with mild pulmonary venous hypertension. No adenopathy. No bone lesions. IMPRESSION: Pulmonary vascular congestion without frank edema or consolidation. Left base atelectasis. Electronically Signed   By: Lowella Grip III M.D.   On: 04/12/2017 12:14    (Echo, Carotid, EGD, Colonoscopy, ERCP)    Subjective:   Discharge Exam: Vitals:   04/24/17 0544 04/24/17 0739  BP: (!) 132/48   Pulse: 67   Resp: 20   Temp: 98.8 F (37.1 C)   SpO2: 96% 93%   Vitals:   04/23/17 2058 04/23/17 2200 04/24/17 0544 04/24/17 0739  BP:  (!) 103/49 (!) 132/48   Pulse:  65 67   Resp:  20 20   Temp:  97.7 F (36.5 C) 98.8 F (37.1 C)   TempSrc:  Oral Oral   SpO2: 93% 97% 96% 93%  Weight:   88.2 kg (194 lb 6.4 oz)   Height:        General: Pt is alert, awake, not in acute distress Cardiovascular: RRR, S1/S2 +, no rubs, no gallops Respiratory: CTA bilaterally, no wheezing, no rhonchi Abdominal: Soft, NT, ND, bowel sounds + Extremities: no edema, no cyanosis    The results of significant diagnostics from this hospitalization (including imaging, microbiology, ancillary and laboratory) are listed below for reference.     Microbiology: No results found for this or any previous visit (from the past 240 hour(s)).   Labs: BNP (last 3 results)  Recent Labs  04/12/17 1200  BNP 144.8*   Basic Metabolic Panel:  Recent Labs Lab 04/18/17 0401 04/19/17 0521 04/20/17 0724 04/21/17 0615 04/22/17 0430  NA 144  --  144 145 141  K 4.7  --  3.7 3.5 2.8*  CL 105  --  103 101 99*  CO2 32  --  35* 37* 36*  GLUCOSE 177*  --  208* 127* 162*  BUN 32*  --  46* 49* 39*  CREATININE 0.62 0.60* 0.60* 0.67 0.60*  CALCIUM 8.7*  --   8.8* 8.5* 8.0*   Liver Function Tests:  Recent Labs Lab 04/20/17 0724 04/21/17 0615 04/22/17 0430  AST 32 30 25  ALT 54 54 53  ALKPHOS 46 47 47  BILITOT 0.5 0.9 0.8  PROT 5.3* 5.3* 4.8*  ALBUMIN 2.6* 2.6* 2.5*   No results for input(s): LIPASE, AMYLASE in the last 168 hours. No results for input(s): AMMONIA in the last 168 hours. CBC:  Recent Labs Lab 04/18/17 0401 04/20/17 0724 04/21/17 0615 04/22/17 0430  WBC 9.9 7.7 7.6 7.8  HGB 13.6 13.5 13.0 12.9*  HCT 43.1 42.5 40.6 40.7  MCV 83.2 83.3 83.2 83.1  PLT 188 201 194 188   Cardiac Enzymes: No results for input(s): CKTOTAL, CKMB, CKMBINDEX, TROPONINI in the last 168 hours. BNP: Invalid input(s): POCBNP CBG:  Recent Labs Lab 04/23/17 1113 04/23/17 1642 04/23/17 2048 04/24/17 0738 04/24/17 1118  GLUCAP 176* 189* 154* 134* 166*   D-Dimer No results for input(s): DDIMER in the last 72 hours. Hgb A1c No results for input(s): HGBA1C in the last 72 hours. Lipid Profile No results for input(s): CHOL, HDL, LDLCALC, TRIG, CHOLHDL, LDLDIRECT in the last 72 hours. Thyroid function studies No results for input(s): TSH, T4TOTAL, T3FREE, THYROIDAB in the last 72 hours.  Invalid input(s): FREET3 Anemia work up No results for input(s): VITAMINB12, FOLATE, FERRITIN, TIBC, IRON, RETICCTPCT in the last 72 hours. Urinalysis    Component Value Date/Time   COLORURINE AMBER (A) 05/05/2013 1621   APPEARANCEUR CLEAR 05/05/2013 1621   LABSPEC 1.025 05/05/2013 1621   PHURINE 5.5 05/05/2013 1621   GLUCOSEU NEGATIVE 05/05/2013 1621   HGBUR NEGATIVE 05/05/2013 1621   HGBUR negative 03/31/2009 1514   BILIRUBINUR SMALL (A) 05/05/2013 1621   KETONESUR NEGATIVE 05/05/2013 1621   PROTEINUR NEGATIVE 05/05/2013 1621   UROBILINOGEN 1.0 05/05/2013 1621   NITRITE NEGATIVE 05/05/2013 1621   LEUKOCYTESUR NEGATIVE 05/05/2013 1621   Sepsis Labs Invalid input(s): PROCALCITONIN,  WBC,  LACTICIDVEN Microbiology No results found for  this or any previous visit (from the past 240 hour(s)).   Time coordinating discharge: Over 30 minutes  SIGNED:   Waldron Session, MD  [facility] 04/24/2017, 1:36 PM Pager   If 7PM-7AM, please contact night-coverage [email or web address] Password [email or web address]

## 2017-04-24 NOTE — Clinical Social Work Placement (Signed)
   CLINICAL SOCIAL WORK PLACEMENT  NOTE  Date:  04/24/2017  Patient Details  Name: Andrew [name] MRN: [id number] Date of Birth: 09-05-1941  Clinical Social Work is seeking post-discharge placement for this patient at the [place] level of care (*CSW will initial, date and re-position this form in  chart as items are completed):  Yes   Patient/family provided with [place] Work Department's list of facilities offering this level of care within the geographic area requested by the patient (or if unable, by the patient's family).  Yes   Patient/family informed of their freedom to choose among providers that offer the needed level of care, that participate in Medicare, Medicaid or managed care program needed by the patient, have an available bed and are willing to accept the patient.  Yes   Patient/family informed of [facility]'s ownership interest in [facility] and [facility], as well as of the fact that they are under no obligation to receive care at these facilities.  PASRR submitted to EDS on       PASRR number received on       Existing PASRR number confirmed on 04/24/17     FL2 transmitted to all facilities in geographic area requested by pt/family on 04/24/17     FL2 transmitted to all facilities within larger geographic area on       Patient informed that his/her managed care company has contracts with or will negotiate with certain facilities, including the following:        Yes   Patient/family informed of bed offers received.  Patient [facility] bed at [facility]     Physician recommends and patient chooses bed at      Patient to be transferred to [facility] on 04/24/17.  Patient to be transferred to facility by [facility]     Patient family notified on 04/24/17 of transfer.  Name of family member notified:  Rip Harbour, dtr.      PHYSICIAN       Additional Comment:  LCSW signing  off  _______________________________________________ Ihor Gully, LCSW 04/24/2017, 5:16 PM

## 2017-04-24 NOTE — Progress Notes (Signed)
Pt left unit via [facility]. PT stable. IV site and telemetry removed via day nurse. PT left on stretcher. No s/s of complications. Belongings with patient. Report called to [facility] by K workman RN

## 2017-04-26 ENCOUNTER — Telehealth: Payer: Self-pay

## 2017-04-26 NOTE — Telephone Encounter (Signed)
This is a patient of [facility], who was admitted to a SNF after hospitalization. [facility] F/U is needed. Hospital discharge from [facility] on 04/24/17. I apologize I do not see the facility pt was discharged to only "disposition: SNF." -MM

## 2017-05-13 ENCOUNTER — Emergency Department (HOSPITAL_COMMUNITY): Payer: Medicare PPO

## 2017-05-13 ENCOUNTER — Inpatient Hospital Stay (HOSPITAL_COMMUNITY)
Admission: EM | Admit: 2017-05-13 | Discharge: 2017-05-17 | DRG: 194 | Disposition: A | Discharge status: Home Health | Payer: Medicare PPO | Attending: Internal Medicine | Admitting: Internal Medicine

## 2017-05-13 ENCOUNTER — Encounter (HOSPITAL_COMMUNITY): Payer: Self-pay | Admitting: Emergency Medicine

## 2017-05-13 DIAGNOSIS — J9611 Chronic respiratory failure with hypoxia: Secondary | ICD-10-CM | POA: Diagnosis present

## 2017-05-13 DIAGNOSIS — R251 Tremor, unspecified: Secondary | ICD-10-CM | POA: Diagnosis not present

## 2017-05-13 DIAGNOSIS — M545 Low back pain, unspecified: Secondary | ICD-10-CM | POA: Diagnosis present

## 2017-05-13 DIAGNOSIS — Z9981 Dependence on supplemental oxygen: Secondary | ICD-10-CM

## 2017-05-13 DIAGNOSIS — E119 Type 2 diabetes mellitus without complications: Secondary | ICD-10-CM

## 2017-05-13 DIAGNOSIS — Z8249 Family history of ischemic heart disease and other diseases of the circulatory system: Secondary | ICD-10-CM

## 2017-05-13 DIAGNOSIS — Z7901 Long term (current) use of anticoagulants: Secondary | ICD-10-CM

## 2017-05-13 DIAGNOSIS — Z888 Allergy status to other drugs, medicaments and biological substances status: Secondary | ICD-10-CM

## 2017-05-13 DIAGNOSIS — G8929 Other chronic pain: Secondary | ICD-10-CM | POA: Diagnosis present

## 2017-05-13 DIAGNOSIS — I119 Hypertensive heart disease without heart failure: Secondary | ICD-10-CM | POA: Diagnosis present

## 2017-05-13 DIAGNOSIS — Z794 Long term (current) use of insulin: Secondary | ICD-10-CM

## 2017-05-13 DIAGNOSIS — I4891 Unspecified atrial fibrillation: Secondary | ICD-10-CM | POA: Diagnosis present

## 2017-05-13 DIAGNOSIS — I2510 Atherosclerotic heart disease of native coronary artery without angina pectoris: Secondary | ICD-10-CM | POA: Diagnosis present

## 2017-05-13 DIAGNOSIS — Z960 Presence of urogenital implants: Secondary | ICD-10-CM | POA: Diagnosis present

## 2017-05-13 DIAGNOSIS — J441 Chronic obstructive pulmonary disease with (acute) exacerbation: Secondary | ICD-10-CM | POA: Diagnosis not present

## 2017-05-13 DIAGNOSIS — Z825 Family history of asthma and other chronic lower respiratory diseases: Secondary | ICD-10-CM

## 2017-05-13 DIAGNOSIS — J189 Pneumonia, unspecified organism: Secondary | ICD-10-CM | POA: Diagnosis not present

## 2017-05-13 DIAGNOSIS — Y95 Nosocomial condition: Secondary | ICD-10-CM | POA: Diagnosis present

## 2017-05-13 DIAGNOSIS — K219 Gastro-esophageal reflux disease without esophagitis: Secondary | ICD-10-CM | POA: Diagnosis present

## 2017-05-13 DIAGNOSIS — I482 Chronic atrial fibrillation: Secondary | ICD-10-CM | POA: Diagnosis present

## 2017-05-13 DIAGNOSIS — F17210 Nicotine dependence, cigarettes, uncomplicated: Secondary | ICD-10-CM | POA: Diagnosis present

## 2017-05-13 DIAGNOSIS — Z85828 Personal history of other malignant neoplasm of skin: Secondary | ICD-10-CM

## 2017-05-13 DIAGNOSIS — Z833 Family history of diabetes mellitus: Secondary | ICD-10-CM

## 2017-05-13 DIAGNOSIS — E785 Hyperlipidemia, unspecified: Secondary | ICD-10-CM | POA: Diagnosis present

## 2017-05-13 DIAGNOSIS — I10 Essential (primary) hypertension: Secondary | ICD-10-CM | POA: Diagnosis present

## 2017-05-13 DIAGNOSIS — Z79899 Other long term (current) drug therapy: Secondary | ICD-10-CM

## 2017-05-13 DIAGNOSIS — I714 Abdominal aortic aneurysm, without rupture, unspecified: Secondary | ICD-10-CM | POA: Diagnosis present

## 2017-05-13 DIAGNOSIS — J440 Chronic obstructive pulmonary disease with acute lower respiratory infection: Secondary | ICD-10-CM | POA: Diagnosis present

## 2017-05-13 MED ORDER — METHYLPREDNISOLONE SODIUM SUCC 125 MG IJ SOLR
125.0000 mg | Freq: Once | INTRAMUSCULAR | Status: AC
  Administered 2017-05-13: 125 mg via INTRAVENOUS
  Filled 2017-05-13: qty 2

## 2017-05-13 MED ORDER — IPRATROPIUM BROMIDE 0.02 % IN SOLN
RESPIRATORY_TRACT | Status: AC
  Administered 2017-05-13: 0.5 mg
  Filled 2017-05-13: qty 2.5

## 2017-05-13 MED ORDER — ALBUTEROL SULFATE (2.5 MG/3ML) 0.083% IN NEBU
5.0000 mg | INHALATION_SOLUTION | Freq: Once | RESPIRATORY_TRACT | Status: AC
  Administered 2017-05-13: 5 mg via RESPIRATORY_TRACT
  Filled 2017-05-13: qty 6

## 2017-05-13 NOTE — ED Triage Notes (Signed)
Pt sent by [facility]. He c/o sob since yesterday and was recently diagnosed with pneumonia and has had one dose of levaquin.

## 2017-05-13 NOTE — ED Provider Notes (Signed)
[facility] DEPT Provider Note   CSN: [id number] Arrival date & time: 05/13/17  2207     History   Chief Complaint Chief Complaint  Patient presents with  . Shortness of Breath    HPI Andrew [name] is a 76 y.o. male.  Patient from nursing facility with sudden onset of shortness of breath this evening. He believes that he got choked while trying to take a pill and had difficulty breathing after this. Patient was apparently diagnosed with pneumonia at his facility one day ago and received one dose of Levaquin. He does have a history of COPD on chronic oxygen. This was increased to 4 L. He states he does has been coughing but does not know what looks like. No chest pain or fever. He denies any pain. He is a poor historian. Notably he was admitted for COPD exacerbation one month ago and required intubation after failing BiPAP. He denies any leg pain or leg swelling. Denies any vomiting or diarrhea.   The history is provided by the patient and the nursing home.  Shortness of Breath  Associated symptoms include rhinorrhea and cough. Pertinent negatives include no fever, no headaches, no neck pain, no chest pain, no vomiting, no abdominal pain, no rash and no leg swelling.    Past Medical History:  Diagnosis Date  . A-fib ([place])   . Abdominal pain   . Adenomatous colon polyp   . Arthritis   . Asthma   . CAD (coronary artery disease)   . Cancer ([place])    skin, nose  . Cataract    bilateral  . Chronic LBP   . COPD (chronic obstructive pulmonary disease) ([place])   . Decreased libido   . Diverticulosis of colon   . Esophageal stricture   . Fatigue   . GERD (gastroesophageal reflux disease)   . History of small bowel obstruction   . Hyperlipidemia   . Hypertension   . Hypertrophy of prostate with urinary obstruction and other lower urinary tract symptoms (LUTS)   . Palpitations   . Peri-rectal abscess   . Personal history of renal calculi   . Prostatitis, acute   . Tobacco abuse      Patient Active Problem List   Diagnosis Date Noted  . Acute respiratory failure with hypoxia and hypercapnia ([place]) 04/12/2017  . Pneumonia 10/24/2016  . AAA (abdominal aortic aneurysm) ([place]) 01/04/2011  . Dizziness, nonspecific 12/26/2010  . UNSPECIFIED PERIPHERAL VASCULAR DISEASE 09/23/2010  . LEG CRAMPS 09/13/2010  . Diabetes mellitus ([place]) 02/11/2010  . CHRONIC OBSTRUCTIVE PULMONARY DISEASE, ACUTE EXACERBATION 02/11/2010  . OLECRANON BURSITIS, RIGHT 01/26/2010  . PENILE PAIN 12/29/2009  . ABNORMAL EJACULATION 05/05/2009  . LARYNGITIS, ACUTE 04/21/2009  . ATRIAL FIBRILLATION 04/08/2009  . PALPITATIONS 04/08/2009  . FATIGUE 03/31/2009  . LIBIDO, DECREASED 03/31/2009  . HYPERTROPHY PROSTATE W/UR OBST & OTH LUTS 03/25/2009  . ACUTE PROSTATITIS 03/25/2009  . SMALL BOWEL OBSTRUCTION, HX OF 11/28/2007  . Hyperlipidemia 11/13/2007  . COPD 10/02/2007  . LOW BACK PAIN, CHRONIC 10/02/2007  . ADENOMATOUS COLONIC POLYP 05/22/2007  . ESOPHAGEAL STRICTURE 05/22/2007  . DIVERTICULOSIS, COLON 05/22/2007  . TOBACCO ABUSE 05/08/2007  . Essential hypertension 05/08/2007  . CORONARY ARTERY DISEASE 05/08/2007  . ASTHMA 05/08/2007  . GERD 05/08/2007  . RENAL CALCULUS, HX OF 05/08/2007  . ABSCESS, PERIRECTAL, HX OF 05/08/2007  . ARTHRITIS, HX OF 05/08/2007  . PENILE PROSTHESIS 05/08/2007  . LAMINECTOMY, LUMBAR, HX OF 05/08/2007    Past Surgical History:  Procedure Laterality  Date  . CATARACT EXTRACTION  04/2010   bilateral  . HERNIA REPAIR    . LUMBAR LAMINECTOMY     with Harrington rods  . PENILE PROSTHESIS PLACEMENT    . TONSILLECTOMY         Home Medications    Prior to Admission medications   Medication Sig Start Date End Date Taking? Authorizing Provider  albuterol (PROVENTIL HFA;VENTOLIN HFA) 108 (90 BASE) MCG/ACT inhaler Inhale 2 puffs into the lungs every 4 (four) hours as needed for wheezing.     [provider]  albuterol (PROVENTIL) (2.5 MG/3ML) 0.083%  nebulizer solution Take 2.5 mg by nebulization every 4 (four) hours as needed for wheezing or shortness of breath.    [provider]  apixaban (ELIQUIS) 2.5 MG TABS tablet Take 1 tablet (2.5 mg total) by mouth 2 (two) times daily. 04/24/17   Waldron Session, MD  Cyanocobalamin (VITAMIN B-12) 2500 MCG SUBL Place 2,500 mcg under the tongue daily.    [provider]  diltiazem (TIAZAC) 300 MG 24 hr capsule Take 300 mg by mouth daily.     [provider]  glipiZIDE (GLUCOTROL) 5 MG tablet Take 5 mg by mouth daily before breakfast.    [provider]  insulin aspart (NOVOLOG) 100 UNIT/ML injection Per sliding scale    [provider]  ipratropium-albuterol (DUONEB) 0.5-2.5 (3) MG/3ML SOLN Take 3 mLs by nebulization QID.     [provider]  metFORMIN (GLUMETZA) 1000 MG (MOD) 24 hr tablet Take 1,000 mg by mouth daily with breakfast.    [provider]  montelukast (SINGULAIR) 10 MG tablet Take 10 mg by mouth daily.    [provider]  nicotine (NICODERM CQ - DOSED IN MG/24 HOURS) 14 mg/24hr patch Place 1 patch (14 mg total) onto the skin daily. 04/25/17   Waldron Session, MD  omeprazole (PRILOSEC) 40 MG capsule Take 40 mg by mouth daily.    [provider]  rosuvastatin (CRESTOR) 20 MG tablet Take 1 tablet (20 mg total) by mouth daily. 12/29/10   Shawna Orleans, Doe-Hyun R, DO  saxagliptin HCl (ONGLYZA) 5 MG TABS tablet Take 5 mg by mouth daily.    [provider]  senna-docusate (SENOKOT-S) 8.6-50 MG tablet Take 1 tablet by mouth at bedtime as needed for mild constipation. 04/24/17   Waldron Session, MD  umeclidinium-vilanterol (ANORO ELLIPTA) 62.5-25 MCG/INH AEPB Inhale 1 puff into the lungs daily.    [provider]    Family History Family History  Problem Relation Age of Onset  . Heart disease Brother        CAD  . Diabetes Brother   . COPD Brother   . Emphysema Brother     Social History Social History  Substance  Use Topics  . Smoking status: Current Every Day Smoker    Types: Cigarettes  . Smokeless tobacco: Never Used  . Alcohol use No     Allergies   Procaine; Procaine hcl; and Atorvastatin   Review of Systems Review of Systems  Constitutional: Negative for chills and fever.  HENT: Positive for congestion and rhinorrhea.   Respiratory: Positive for cough and shortness of breath.   Cardiovascular: Negative for chest pain and leg swelling.  Gastrointestinal: Negative for abdominal pain, nausea and vomiting.  Genitourinary: Negative for dysuria, hematuria and urgency.  Musculoskeletal: Negative for arthralgias, myalgias and neck pain.  Skin: Negative for rash.  Neurological: Positive for weakness. Negative for light-headedness and headaches.  Psychiatric/Behavioral: Negative for agitation  and hallucinations.   all other systems are negative except as noted in the HPI and PMH.     Physical Exam Updated Vital Signs BP 132/72   Pulse 61   Temp 99.2 F (37.3 C) (Rectal)   Resp (!) 26   Ht 5\' 11"  (1.803 m)   Wt 88 kg (194 lb)   SpO2 100%   BMI 27.06 kg/m   Physical Exam  Constitutional: He is oriented to person, place, and time. He appears well-developed and well-nourished. He appears distressed.  Dyspneic with conversation   HENT:  Head: Normocephalic and atraumatic.  Mouth/Throat: Oropharynx is clear and moist. No oropharyngeal exudate.  Eyes: Pupils are equal, round, and reactive to light. Conjunctivae and EOM are normal.  Neck: Normal range of motion. Neck supple.  No meningismus.  Cardiovascular: Normal rate, regular rhythm, normal heart sounds and intact distal pulses.   No murmur heard. Pulmonary/Chest: He is in respiratory distress. He has wheezes. He exhibits no tenderness.  RR 30s.  Scattered rhonchi and wheezing  Abdominal: Soft. There is no tenderness. There is no rebound and no guarding.  Musculoskeletal: Normal range of motion. He exhibits no edema or  tenderness.  Neurological: He is alert and oriented to person, place, and time. No cranial nerve deficit. He exhibits normal muscle tone. Coordination normal.   5/5 strength throughout. CN 2-12 intact.Equal grip strength.   Skin: Skin is warm.  Psychiatric: He has a normal mood and affect. His behavior is normal.  Nursing note and vitals reviewed.    ED Treatments / Results  Labs (all labs ordered are listed, but only abnormal results are displayed) Labs Reviewed  CBC WITH DIFFERENTIAL/PLATELET - Abnormal; Notable for the following:       Result Value   Hemoglobin 11.7 (*)    HCT 35.6 (*)    RDW 17.1 (*)    All other components within normal limits  BASIC METABOLIC PANEL - Abnormal; Notable for the following:    Chloride 98 (*)    Glucose, Bld 142 (*)    All other components within normal limits  BLOOD GAS, ARTERIAL - Abnormal; Notable for the following:    pO2, Arterial 68.4 (*)    Bicarbonate 29.4 (*)    Acid-Base Excess 6.0 (*)    Allens test (pass/fail) NOT INDICATED (*)    All other components within normal limits  GLUCOSE, CAPILLARY - Abnormal; Notable for the following:    Glucose-Capillary 223 (*)    All other components within normal limits  GLUCOSE, CAPILLARY - Abnormal; Notable for the following:    Glucose-Capillary 225 (*)    All other components within normal limits  CULTURE, BLOOD (ROUTINE X 2)  CULTURE, BLOOD (ROUTINE X 2)  MRSA PCR SCREENING  GRAM STAIN  TROPONIN I  BRAIN NATRIURETIC PEPTIDE  HIV ANTIBODY (ROUTINE TESTING)  STREP PNEUMONIAE URINARY ANTIGEN  BASIC METABOLIC PANEL  CBC  I-STAT CG4 LACTIC ACID, ED    EKG  EKG Interpretation  Date/Time:  Saturday May 13 2017 22:19:40 EDT Ventricular Rate:  77 PR Interval:    QRS Duration: 106 QT Interval:  374 QTC Calculation: 424 R Axis:   85 Text Interpretation:  Sinus rhythm Atrial premature complexes Borderline right axis deviation Confirmed by Nat Christen [phone]) on 05/13/2017 10:26:52  PM       Radiology Dg Chest 2 View  Result Date: 05/13/2017 CLINICAL DATA:  Shortness of breath EXAM: CHEST  2 VIEW COMPARISON:  04/21/2017, 04/10/2010 FINDINGS: Mild cardiomegaly. Increased  bibasilar opacity since the prior study. No large pleural effusion. No pneumothorax. IMPRESSION: Increased bibasilar opacities suspicious for bibasilar pneumonia. Electronically Signed   By: Donavan Foil M.D.   On: 05/13/2017 23:42    Procedures Procedures (including critical care time)  Medications Ordered in ED Medications  albuterol (PROVENTIL) (2.5 MG/3ML) 0.083% nebulizer solution 5 mg (5 mg Nebulization Given 05/13/17 2238)  ipratropium (ATROVENT) 0.02 % nebulizer solution (0.5 mg  Given 05/13/17 2253)  methylPREDNISolone sodium succinate (SOLU-MEDROL) 125 mg/2 mL injection 125 mg (125 mg Intravenous Given 05/13/17 2308)     Initial Impression / Assessment and Plan / ED Course  I have reviewed the triage vital signs and the nursing notes.  Pertinent labs & imaging results that were available during my care of the patient were reviewed by me and considered in my medical decision making (see chart for details).    Patient presents with shortness of breath that onset acutely tonight after questionably aspirating a pill. No chest pain. He has scattered rhonchi and wheezing.  CXR with basilar infiltrates. Will treat as HCAP.  Antibiotics and IVF given after blood cultures obtained. Lactate normal. Steroids and nebs for COPD.  Mild tachypnea at rest. O2 increased to 4L. ABG without significant CO2 retention.  Given age and recent intubation, plan admission. D/w Dr. Shanon Brow.  Final Clinical Impressions(s) / ED Diagnoses   Final diagnoses:  COPD exacerbation ([place])  HCAP (healthcare-associated pneumonia)    New Prescriptions New Prescriptions   No medications on file     Ezequiel Essex, MD 05/14/17 [phone]

## 2017-05-14 ENCOUNTER — Encounter (HOSPITAL_COMMUNITY): Payer: Self-pay

## 2017-05-14 DIAGNOSIS — R251 Tremor, unspecified: Secondary | ICD-10-CM | POA: Diagnosis not present

## 2017-05-14 DIAGNOSIS — I2510 Atherosclerotic heart disease of native coronary artery without angina pectoris: Secondary | ICD-10-CM | POA: Diagnosis present

## 2017-05-14 DIAGNOSIS — Z833 Family history of diabetes mellitus: Secondary | ICD-10-CM | POA: Diagnosis not present

## 2017-05-14 DIAGNOSIS — J9611 Chronic respiratory failure with hypoxia: Secondary | ICD-10-CM | POA: Diagnosis present

## 2017-05-14 DIAGNOSIS — J189 Pneumonia, unspecified organism: Secondary | ICD-10-CM | POA: Diagnosis not present

## 2017-05-14 DIAGNOSIS — F17210 Nicotine dependence, cigarettes, uncomplicated: Secondary | ICD-10-CM | POA: Diagnosis present

## 2017-05-14 DIAGNOSIS — J441 Chronic obstructive pulmonary disease with (acute) exacerbation: Secondary | ICD-10-CM

## 2017-05-14 DIAGNOSIS — E119 Type 2 diabetes mellitus without complications: Secondary | ICD-10-CM | POA: Diagnosis present

## 2017-05-14 DIAGNOSIS — Z85828 Personal history of other malignant neoplasm of skin: Secondary | ICD-10-CM | POA: Diagnosis not present

## 2017-05-14 DIAGNOSIS — I10 Essential (primary) hypertension: Secondary | ICD-10-CM | POA: Diagnosis not present

## 2017-05-14 DIAGNOSIS — I482 Chronic atrial fibrillation: Secondary | ICD-10-CM | POA: Diagnosis present

## 2017-05-14 DIAGNOSIS — Y95 Nosocomial condition: Secondary | ICD-10-CM

## 2017-05-14 DIAGNOSIS — Z960 Presence of urogenital implants: Secondary | ICD-10-CM | POA: Diagnosis present

## 2017-05-14 DIAGNOSIS — I714 Abdominal aortic aneurysm, without rupture: Secondary | ICD-10-CM | POA: Diagnosis not present

## 2017-05-14 DIAGNOSIS — Z9981 Dependence on supplemental oxygen: Secondary | ICD-10-CM | POA: Diagnosis not present

## 2017-05-14 DIAGNOSIS — K219 Gastro-esophageal reflux disease without esophagitis: Secondary | ICD-10-CM | POA: Diagnosis present

## 2017-05-14 DIAGNOSIS — I4891 Unspecified atrial fibrillation: Secondary | ICD-10-CM | POA: Diagnosis not present

## 2017-05-14 DIAGNOSIS — E1149 Type 2 diabetes mellitus with other diabetic neurological complication: Secondary | ICD-10-CM | POA: Diagnosis not present

## 2017-05-14 DIAGNOSIS — G8929 Other chronic pain: Secondary | ICD-10-CM | POA: Diagnosis present

## 2017-05-14 DIAGNOSIS — E785 Hyperlipidemia, unspecified: Secondary | ICD-10-CM | POA: Diagnosis present

## 2017-05-14 DIAGNOSIS — I119 Hypertensive heart disease without heart failure: Secondary | ICD-10-CM | POA: Diagnosis present

## 2017-05-14 DIAGNOSIS — Z8249 Family history of ischemic heart disease and other diseases of the circulatory system: Secondary | ICD-10-CM | POA: Diagnosis not present

## 2017-05-14 DIAGNOSIS — Z888 Allergy status to other drugs, medicaments and biological substances status: Secondary | ICD-10-CM | POA: Diagnosis not present

## 2017-05-14 DIAGNOSIS — Z7901 Long term (current) use of anticoagulants: Secondary | ICD-10-CM | POA: Diagnosis not present

## 2017-05-14 DIAGNOSIS — Z79899 Other long term (current) drug therapy: Secondary | ICD-10-CM | POA: Diagnosis not present

## 2017-05-14 DIAGNOSIS — Z794 Long term (current) use of insulin: Secondary | ICD-10-CM | POA: Diagnosis not present

## 2017-05-14 DIAGNOSIS — Z825 Family history of asthma and other chronic lower respiratory diseases: Secondary | ICD-10-CM | POA: Diagnosis not present

## 2017-05-14 DIAGNOSIS — J440 Chronic obstructive pulmonary disease with acute lower respiratory infection: Secondary | ICD-10-CM | POA: Diagnosis present

## 2017-05-14 LAB — BLOOD GAS, ARTERIAL
ACID-BASE EXCESS: 6 mmol/L — AB (ref 0.0–2.0)
BICARBONATE: 29.4 mmol/L — AB (ref 20.0–28.0)
DRAWN BY: 382351
FIO2: 36
O2 Saturation: 92.5 %
PO2 ART: 68.4 mmHg — AB (ref 83.0–108.0)
pCO2 arterial: 46.1 mmHg (ref 32.0–48.0)
pH, Arterial: 7.433 (ref 7.350–7.450)

## 2017-05-14 LAB — BASIC METABOLIC PANEL
Anion gap: 8 (ref 5–15)
BUN: 8 mg/dL (ref 6–20)
CALCIUM: 9 mg/dL (ref 8.9–10.3)
CHLORIDE: 98 mmol/L — AB (ref 101–111)
CO2: 29 mmol/L (ref 22–32)
CREATININE: 0.65 mg/dL (ref 0.61–1.24)
GFR calc non Af Amer: >60 mL/min (ref >60)
Glucose, Bld: 142 mg/dL — ABNORMAL HIGH (ref 65–99)
Potassium: 4.2 mmol/L (ref 3.5–5.1)
SODIUM: 135 mmol/L (ref 135–145)

## 2017-05-14 LAB — TROPONIN I

## 2017-05-14 LAB — CBC WITH DIFFERENTIAL/PLATELET
BASOS PCT: 0 %
Basophils Absolute: 0 10*3/uL (ref 0.0–0.1)
EOS ABS: 0.1 10*3/uL (ref 0.0–0.7)
EOS PCT: 1 %
HCT: 35.6 % — ABNORMAL LOW (ref 39.0–52.0)
HEMOGLOBIN: 11.7 g/dL — AB (ref 13.0–17.0)
Lymphocytes Relative: 21 %
Lymphs Abs: 2.1 10*3/uL (ref 0.7–4.0)
MCH: 26.5 pg (ref 26.0–34.0)
MCHC: 32.9 g/dL (ref 30.0–36.0)
MCV: 80.7 fL (ref 78.0–100.0)
MONO ABS: 1 10*3/uL (ref 0.1–1.0)
MONOS PCT: 10 %
NEUTROS PCT: 68 %
Neutro Abs: 6.8 10*3/uL (ref 1.7–7.7)
PLATELETS: 268 10*3/uL (ref 150–400)
RBC: 4.41 MIL/uL (ref 4.22–5.81)
RDW: 17.1 % — AB (ref 11.5–15.5)
WBC: 10 10*3/uL (ref 4.0–10.5)

## 2017-05-14 LAB — MRSA PCR SCREENING: MRSA by PCR: NEGATIVE

## 2017-05-14 LAB — GLUCOSE, CAPILLARY
GLUCOSE-CAPILLARY: 225 mg/dL — AB (ref 65–99)
GLUCOSE-CAPILLARY: 291 mg/dL — AB (ref 65–99)
GLUCOSE-CAPILLARY: 246 mg/dL — AB (ref 65–99)
Glucose-Capillary: 223 mg/dL — ABNORMAL HIGH (ref 65–99)
Glucose-Capillary: 279 mg/dL — ABNORMAL HIGH (ref 65–99)

## 2017-05-14 LAB — BRAIN NATRIURETIC PEPTIDE: B NATRIURETIC PEPTIDE 5: 55 pg/mL (ref 0.0–100.0)

## 2017-05-14 LAB — STREP PNEUMONIAE URINARY ANTIGEN: STREP PNEUMO URINARY ANTIGEN: NEGATIVE

## 2017-05-14 LAB — I-STAT CG4 LACTIC ACID, ED: Lactic Acid, Venous: 1.4 mmol/L (ref 0.5–1.9)

## 2017-05-14 MED ORDER — GUAIFENESIN ER 600 MG PO TB12
600.0000 mg | ORAL_TABLET | Freq: Two times a day (BID) | ORAL | Status: DC
  Administered 2017-05-14 (×2): 600 mg via ORAL
  Filled 2017-05-14 (×2): qty 1

## 2017-05-14 MED ORDER — ONDANSETRON HCL 4 MG/2ML IJ SOLN: 4.0000 mg | Freq: Four times a day (QID) | INTRAMUSCULAR | Status: DC | PRN

## 2017-05-14 MED ORDER — NICOTINE 14 MG/24HR TD PT24
14.0000 mg | MEDICATED_PATCH | Freq: Every day | TRANSDERMAL | Status: DC
  Administered 2017-05-14 – 2017-05-17 (×4): 14 mg via TRANSDERMAL
  Filled 2017-05-14 (×4): qty 1

## 2017-05-14 MED ORDER — SODIUM CHLORIDE 0.9% FLUSH: 3.0000 mL | INTRAVENOUS | Status: DC | PRN

## 2017-05-14 MED ORDER — SODIUM CHLORIDE 0.9 % IV SOLN: 250.0000 mL | INTRAVENOUS | Status: DC | PRN

## 2017-05-14 MED ORDER — METHYLPREDNISOLONE SODIUM SUCC 125 MG IJ SOLR
80.0000 mg | Freq: Two times a day (BID) | INTRAMUSCULAR | Status: DC
  Administered 2017-05-14 – 2017-05-16 (×5): 80 mg via INTRAVENOUS
  Filled 2017-05-14 (×5): qty 2

## 2017-05-14 MED ORDER — ALBUTEROL SULFATE (2.5 MG/3ML) 0.083% IN NEBU
2.5000 mg | INHALATION_SOLUTION | RESPIRATORY_TRACT | Status: DC | PRN
  Administered 2017-05-17: 2.5 mg via RESPIRATORY_TRACT
  Filled 2017-05-14: qty 3

## 2017-05-14 MED ORDER — PANTOPRAZOLE SODIUM 40 MG PO TBEC
40.0000 mg | DELAYED_RELEASE_TABLET | Freq: Every day | ORAL | Status: DC
  Administered 2017-05-14 – 2017-05-17 (×4): 40 mg via ORAL
  Filled 2017-05-14 (×4): qty 1

## 2017-05-14 MED ORDER — IPRATROPIUM-ALBUTEROL 0.5-2.5 (3) MG/3ML IN SOLN
2.5000 mg | Freq: Four times a day (QID) | RESPIRATORY_TRACT | Status: DC
  Administered 2017-05-14: 2.5 mg via RESPIRATORY_TRACT
  Filled 2017-05-14 (×2): qty 3

## 2017-05-14 MED ORDER — IPRATROPIUM BROMIDE 0.02 % IN SOLN: 0.5000 mg | Freq: Four times a day (QID) | RESPIRATORY_TRACT | Status: DC

## 2017-05-14 MED ORDER — DEXTROSE 5 % IV SOLN
1.0000 g | Freq: Three times a day (TID) | INTRAVENOUS | Status: DC
  Administered 2017-05-14 – 2017-05-16 (×9): 1 g via INTRAVENOUS
  Filled 2017-05-14 (×13): qty 1

## 2017-05-14 MED ORDER — LEVOFLOXACIN IN D5W 500 MG/100ML IV SOLN
500.0000 mg | Freq: Once | INTRAVENOUS | Status: AC
  Administered 2017-05-14: 500 mg via INTRAVENOUS
  Filled 2017-05-14: qty 100

## 2017-05-14 MED ORDER — IPRATROPIUM-ALBUTEROL 0.5-2.5 (3) MG/3ML IN SOLN
3.0000 mL | Freq: Four times a day (QID) | RESPIRATORY_TRACT | Status: DC
  Administered 2017-05-14 – 2017-05-17 (×14): 3 mL via RESPIRATORY_TRACT
  Filled 2017-05-14 (×13): qty 3

## 2017-05-14 MED ORDER — SENNOSIDES-DOCUSATE SODIUM 8.6-50 MG PO TABS: 1.0000 | ORAL_TABLET | Freq: Every evening | ORAL | Status: DC | PRN

## 2017-05-14 MED ORDER — APIXABAN 2.5 MG PO TABS
2.5000 mg | ORAL_TABLET | Freq: Two times a day (BID) | ORAL | Status: DC
  Administered 2017-05-14 – 2017-05-17 (×6): 2.5 mg via ORAL
  Filled 2017-05-14 (×6): qty 1

## 2017-05-14 MED ORDER — ONDANSETRON HCL 4 MG PO TABS: 4.0000 mg | ORAL_TABLET | Freq: Four times a day (QID) | ORAL | Status: DC | PRN

## 2017-05-14 MED ORDER — INSULIN ASPART 100 UNIT/ML ~~LOC~~ SOLN
0.0000 [IU] | Freq: Three times a day (TID) | SUBCUTANEOUS | Status: DC
  Administered 2017-05-14: 5 [IU] via SUBCUTANEOUS
  Administered 2017-05-14: 3 [IU] via SUBCUTANEOUS
  Administered 2017-05-14 – 2017-05-15 (×3): 5 [IU] via SUBCUTANEOUS
  Administered 2017-05-15 – 2017-05-16 (×3): 7 [IU] via SUBCUTANEOUS
  Administered 2017-05-16 – 2017-05-17 (×2): 5 [IU] via SUBCUTANEOUS
  Administered 2017-05-17: 3 [IU] via SUBCUTANEOUS
  Administered 2017-05-17: 5 [IU] via SUBCUTANEOUS

## 2017-05-14 MED ORDER — ROSUVASTATIN CALCIUM 20 MG PO TABS
20.0000 mg | ORAL_TABLET | Freq: Every day | ORAL | Status: DC
  Administered 2017-05-14 – 2017-05-17 (×4): 20 mg via ORAL
  Filled 2017-05-14 (×4): qty 1

## 2017-05-14 MED ORDER — SODIUM CHLORIDE 0.9% FLUSH
3.0000 mL | Freq: Two times a day (BID) | INTRAVENOUS | Status: DC
  Administered 2017-05-14 – 2017-05-17 (×8): 3 mL via INTRAVENOUS

## 2017-05-14 MED ORDER — VANCOMYCIN HCL IN DEXTROSE 1-5 GM/200ML-% IV SOLN
1000.0000 mg | Freq: Two times a day (BID) | INTRAVENOUS | Status: DC
  Administered 2017-05-14 – 2017-05-16 (×5): 1000 mg via INTRAVENOUS
  Filled 2017-05-14 (×5): qty 200

## 2017-05-14 MED ORDER — DILTIAZEM HCL ER BEADS 300 MG PO CP24
300.0000 mg | ORAL_CAPSULE | Freq: Every day | ORAL | Status: DC
  Administered 2017-05-14 – 2017-05-17 (×4): 300 mg via ORAL
  Filled 2017-05-14 (×4): qty 1

## 2017-05-14 MED ORDER — VANCOMYCIN HCL IN DEXTROSE 1-5 GM/200ML-% IV SOLN
1000.0000 mg | Freq: Once | INTRAVENOUS | Status: AC
  Administered 2017-05-14: 1000 mg via INTRAVENOUS
  Filled 2017-05-14: qty 200

## 2017-05-14 MED ORDER — GUAIFENESIN ER 600 MG PO TB12
1200.0000 mg | ORAL_TABLET | Freq: Two times a day (BID) | ORAL | Status: DC
Start: 2017-05-14 — End: 2017-05-17
  Administered 2017-05-14 – 2017-05-17 (×6): 1200 mg via ORAL
  Filled 2017-05-14 (×6): qty 2

## 2017-05-14 MED ORDER — MONTELUKAST SODIUM 10 MG PO TABS
10.0000 mg | ORAL_TABLET | Freq: Every day | ORAL | Status: DC
  Administered 2017-05-14 – 2017-05-17 (×4): 10 mg via ORAL
  Filled 2017-05-14 (×4): qty 1

## 2017-05-14 NOTE — ED Notes (Signed)
I-Stat Lactic Acid 1.40 Results not crossing over into chart.

## 2017-05-14 NOTE — Progress Notes (Signed)
ANTIBIOTIC CONSULT NOTE-Preliminary  Pharmacy Consult for Vancomycin  Indication: Pneumonia  Allergies  Allergen Reactions  . Procaine Other (See Comments) and Shortness Of Breath    Syncope  . Procaine Hcl Anaphylaxis and Swelling  . Atorvastatin Palpitations    REACTION: Heartburn    Patient Measurements: Height: 5\' 11"  (180.3 cm) Weight: 194 lb (88 kg) IBW/kg (Calculated) : 75.3  Vital Signs: Temp: 99.2 F (37.3 C) (09/08 2217) Temp Source: Rectal (09/08 2217) BP: 123/93 (09/09 0020) Pulse Rate: 80 (09/09 0020)  Labs:  Recent Labs  05/14/17 0018  WBC 10.0  HGB 11.7*  PLT 268    CrCl cannot be calculated (Patient's most recent lab result is older than the maximum 21 days allowed.).  No results for input(s): VANCOTROUGH, VANCOPEAK, VANCORANDOM, GENTTROUGH, GENTPEAK, GENTRANDOM, TOBRATROUGH, TOBRAPEAK, TOBRARND, AMIKACINPEAK, AMIKACINTROU, AMIKACIN in the last 72 hours.   Microbiology: Recent Results (from the past 720 hour(s))  Culture, respiratory (NON-Expectorated)     Status: None   Collection Time: 04/14/17  7:49 AM  Result Value Ref Range Status   Specimen Description TRACHEAL ASPIRATE  Final   Special Requests NONE  Final   Gram Stain   Final    ABUNDANT WBC PRESENT, PREDOMINANTLY PMN RARE SQUAMOUS EPITHELIAL CELLS PRESENT ABUNDANT GRAM POSITIVE RODS Performed at [facility], [address], [place], [place] [address]    Culture   Final    ABUNDANT CORYNEBACTERIUM STRIATUM MODERATE ENTEROBACTER SPECIES    Report Status 04/18/2017 FINAL  Final   Organism ID, Bacteria ENTEROBACTER SPECIES  Final      Susceptibility   Enterobacter species - MIC*    CEFAZOLIN >=64 RESISTANT Resistant     CEFEPIME <=1 SENSITIVE Sensitive     CEFTAZIDIME <=1 SENSITIVE Sensitive     CEFTRIAXONE <=1 SENSITIVE Sensitive     CIPROFLOXACIN <=0.25 SENSITIVE Sensitive     GENTAMICIN <=1 SENSITIVE Sensitive     IMIPENEM <=0.25 SENSITIVE Sensitive     TRIMETH/SULFA  <=20 SENSITIVE Sensitive     PIP/TAZO <=4 SENSITIVE Sensitive     * MODERATE ENTEROBACTER SPECIES    Medical History: Past Medical History:  Diagnosis Date  . A-fib ([place])   . Abdominal pain   . Adenomatous colon polyp   . Arthritis   . Asthma   . CAD (coronary artery disease)   . Cancer ([place])    skin, nose  . Cataract    bilateral  . Chronic LBP   . COPD (chronic obstructive pulmonary disease) ([place])   . Decreased libido   . Diverticulosis of colon   . Esophageal stricture   . Fatigue   . GERD (gastroesophageal reflux disease)   . History of small bowel obstruction   . Hyperlipidemia   . Hypertension   . Hypertrophy of prostate with urinary obstruction and other lower urinary tract symptoms (LUTS)   . Palpitations   . Peri-rectal abscess   . Personal history of renal calculi   . Prostatitis, acute   . Tobacco abuse     Medications:  Cefepime 9/09>>  Assessment: 76 yo male SNF resident with hx of COPD, s/p hospital discharge 8/20; seen in the ED for increased SOB. Pt diagnosed with pneumonia at SNF and received Levaquin x 1 dose per report. Pharmacy has been asked to dose Vancomycin for HCP.  Goal of Therapy:  Vancomycin troughs 15-20 mcg/ml  Plan:  Preliminary review of pertinent patient information completed.  Protocol will be initiated with 1 dose  of Vancomycin  1 Gm IV.  Forestine Na clinical pharmacist will complete review during morning rounds to assess patient and finalize treatment regimen if needed.  Norberto Sorenson, [facility] 05/14/2017,1:15 AM

## 2017-05-14 NOTE — H&P (Signed)
History and Physical    Andrew [name] WUJ:[id number] DOB: 02-19-1941 DOA: 05/13/2017  PCP: System, Provider Not In  Patient coming from:  SNF  Chief Complaint:    sob  HPI: Andrew [name] is a 76 y.o. male with medical history significant of copd, recent hospitalization requiring intubation last month since in rehab has not been rehabing well comes in with sob, wheezing, cough.  Pt reports he is still not able to walk, or stand for that matter.  He denies fevers.  He reports he is feeling normal now and [facility] as he is eager to continue his therapy as he thinks he is going to go home soon ( he reports its being set up for his daughter to take care of him at home).  He is on 2 liters o2 chronically.  Pt found to have copde and possible pna, referred for admission for such.  Was requiring 4 liters [place] with sats in low 90s in ED.   Review of Systems: As per HPI otherwise 10 point review of systems negative.   Past Medical History:  Diagnosis Date  . A-fib ([place])   . Abdominal pain   . Adenomatous colon polyp   . Arthritis   . Asthma   . CAD (coronary artery disease)   . Cancer ([place])    skin, nose  . Cataract    bilateral  . Chronic LBP   . COPD (chronic obstructive pulmonary disease) ([place])   . Decreased libido   . Diverticulosis of colon   . Esophageal stricture   . Fatigue   . GERD (gastroesophageal reflux disease)   . History of small bowel obstruction   . Hyperlipidemia   . Hypertension   . Hypertrophy of prostate with urinary obstruction and other lower urinary tract symptoms (LUTS)   . Palpitations   . Peri-rectal abscess   . Personal history of renal calculi   . Prostatitis, acute   . Tobacco abuse     Past Surgical History:  Procedure Laterality Date  . CATARACT EXTRACTION  04/2010   bilateral  . HERNIA REPAIR    . LUMBAR LAMINECTOMY     with Harrington rods  . PENILE PROSTHESIS PLACEMENT    . TONSILLECTOMY       reports that he has  been smoking Cigarettes.  He has never used smokeless tobacco. He reports that he does not drink alcohol or use drugs.  Allergies  Allergen Reactions  . Procaine Other (See Comments) and Shortness Of Breath    Syncope  . Procaine Hcl Anaphylaxis and Swelling  . Atorvastatin Palpitations    REACTION: Heartburn    Family History  Problem Relation Age of Onset  . Heart disease Brother        CAD  . Diabetes Brother   . COPD Brother   . Emphysema Brother     Prior to Admission medications   Medication Sig Start Date End Date Taking? Authorizing Provider  albuterol (PROVENTIL HFA;VENTOLIN HFA) 108 (90 BASE) MCG/ACT inhaler Inhale 2 puffs into the lungs every 4 (four) hours as needed for wheezing.     [provider]  albuterol (PROVENTIL) (2.5 MG/3ML) 0.083% nebulizer solution Take 2.5 mg by nebulization every 4 (four) hours as needed for wheezing or shortness of breath.    [provider]  apixaban (ELIQUIS) 2.5 MG TABS tablet Take 1 tablet (2.5 mg total) by mouth 2 (two) times daily. 04/24/17   Burnis Medin,  [place], MD  Cyanocobalamin (VITAMIN B-12) 2500 MCG SUBL Place 2,500 mcg under the tongue daily.    [provider]  diltiazem (TIAZAC) 300 MG 24 hr capsule Take 300 mg by mouth daily.     [provider]  glipiZIDE (GLUCOTROL) 5 MG tablet Take 5 mg by mouth daily before breakfast.    [provider]  insulin aspart (NOVOLOG) 100 UNIT/ML injection Per sliding scale    [provider]  ipratropium-albuterol (DUONEB) 0.5-2.5 (3) MG/3ML SOLN Take 3 mLs by nebulization QID.     [provider]  metFORMIN (GLUMETZA) 1000 MG (MOD) 24 hr tablet Take 1,000 mg by mouth daily with breakfast.    [provider]  montelukast (SINGULAIR) 10 MG tablet Take 10 mg by mouth daily.    [provider]  nicotine (NICODERM CQ - DOSED IN MG/24 HOURS) 14 mg/24hr patch Place 1 patch (14 mg total) onto the skin daily. 04/25/17   Waldron Session, MD  omeprazole (PRILOSEC) 40 MG capsule Take 40 mg by mouth daily.    [provider]  rosuvastatin (CRESTOR) 20 MG tablet Take 1 tablet (20 mg total) by mouth daily. 12/29/10   Shawna Orleans, Doe-Hyun R, DO  saxagliptin HCl (ONGLYZA) 5 MG TABS tablet Take 5 mg by mouth daily.    [provider]  senna-docusate (SENOKOT-S) 8.6-50 MG tablet Take 1 tablet by mouth at bedtime as needed for mild constipation. 04/24/17   Waldron Session, MD  umeclidinium-vilanterol (ANORO ELLIPTA) 62.5-25 MCG/INH AEPB Inhale 1 puff into the lungs daily.    [provider]    Physical Exam: Vitals:   05/14/17 0100 05/14/17 0130 05/14/17 0200 05/14/17 0300  BP: 121/66 123/67 136/62 115/62  Pulse: 83 78 76 75  Resp: (!) 23 20 17 18   Temp:      TempSrc:      SpO2: 95% 95% 97% 93%  Weight:      Height:         Constitutional: NAD, calm, comfortable Vitals:   05/14/17 0100 05/14/17 0130 05/14/17 0200 05/14/17 0300  BP: 121/66 123/67 136/62 115/62  Pulse: 83 78 76 75  Resp: (!) 23 20 17 18   Temp:      TempSrc:      SpO2: 95% 95% 97% 93%  Weight:      Height:       Eyes: PERRL, lids and conjunctivae normal ENMT: Mucous membranes are moist. Posterior pharynx clear of any exudate or lesions.Normal dentition.  Neck: normal, supple, no masses, no thyromegaly Respiratory: rhonchi to auscultation bilaterally, no wheezing, no crackles. Normal respiratory effort. No accessory muscle use.  Cardiovascular: Regular rate and rhythm, no murmurs / rubs / gallops. No extremity edema. 2+ pedal pulses. No carotid bruits.  Abdomen: no tenderness, no masses palpated. No hepatosplenomegaly. Bowel sounds positive.  Musculoskeletal: no clubbing / cyanosis. No joint deformity upper and lower extremities. Good ROM, no contractures. Normal muscle tone.  Skin: no rashes, lesions, ulcers. No induration Neurologic: CN 2-12 grossly intact. Sensation intact, DTR normal.  Psychiatric: Normal judgment and insight.  Alert and oriented x 3. Normal mood.    Labs on Admission: I have personally reviewed following labs and imaging studies  CBC:  Recent Labs Lab 05/14/17 0018  WBC 10.0  NEUTROABS 6.8  HGB 11.7*  HCT 35.6*  MCV 80.7  PLT 144   Basic Metabolic Panel:  Recent Labs Lab 05/14/17 0018  NA 135  K 4.2  CL 98*  CO2 29  GLUCOSE 142*  BUN 8  CREATININE 0.65  CALCIUM 9.0   GFR: Estimated Creatinine Clearance: 81.1 mL/min (by C-G formula based on SCr of 0.65 mg/dL).  Cardiac Enzymes:  Recent Labs Lab 05/14/17 0018  TROPONINI <0.03   Radiological Exams on Admission: Dg Chest 2 View  Result Date: 05/13/2017 CLINICAL DATA:  Shortness of breath EXAM: CHEST  2 VIEW COMPARISON:  04/21/2017, 04/10/2010 FINDINGS: Mild cardiomegaly. Increased bibasilar opacity since the prior study. No large pleural effusion. No pneumothorax. IMPRESSION: Increased bibasilar opacities suspicious for bibasilar pneumonia. Electronically Signed   By: Donavan Foil M.D.   On: 05/13/2017 23:42    EKG: Independently reviewed. nsr cxr reviewed bibasilar infiltrates Old chart reviewed Case discussed with dr rancour  Assessment/Plan 76 yo male with acute on chronic resp failure due to copde and HCAP  Principal Problem:   Hospital acquired PNA- place on vanc/cefepime.  Cultures obtained.  Lactic normal.  Stable vitals.  Mildly more hypoxic on his 2 liters, up to 4 liters.  Active Problems:   CHRONIC OBSTRUCTIVE PULMONARY DISEASE, ACUTE EXACERBATION- freq nebs, iv solumedrol, treat component of infection   Diabetes mellitus ([place])- SSI while here   Essential hypertension- stable   ATRIAL FIBRILLATION- stable   LOW BACK PAIN, CHRONIC- noted   AAA (abdominal aortic aneurysm) ([place])- stable  Med list pending    DVT prophylaxis:  Scds, on eloquis needs clarification in epic Code Status:  full Family Communication:  none  Disposition Plan:  Per day team Consults called:  none Admission status:   admission   Sabine Tenenbaum A MD [facility]  If 7PM-7AM, please contact night-coverage [email or web address] Password [email or web address]  05/14/2017, 3:27 AM

## 2017-05-14 NOTE — Progress Notes (Signed)
Patient admitted to the hospital earlier this morning by Dr. Shanon Brow.  Patient seen and examined. He says he is feeling better. He describes a productive cough. Lungs show bilateral expiratory wheezes with rhonchi. He has 1+ pedal edema which appears to be chronic  He was presented to hospital for acute on chronic respiratory failure, related to COPD and healthcare associated pneumonia. He is on IV antibiotics, steroids, bronchodilators and mucolytics. BNP is noted to be normal. He continues to have some mild wheezes bilaterally. Continue current treatments.  Andrew [name]

## 2017-05-14 NOTE — Progress Notes (Signed)
ANTIBIOTIC CONSULT NOTE-  Pharmacy Consult for Vancomycin  Indication: Pneumonia  Allergies  Allergen Reactions  . Procaine Other (See Comments) and Shortness Of Breath    Syncope  . Procaine Hcl Anaphylaxis and Swelling  . Atorvastatin Palpitations    REACTION: Heartburn   Patient Measurements: Height: 5\' 5"  (165.1 cm) Weight: 180 lb 5.4 oz (81.8 kg) IBW/kg (Calculated) : 61.5  Vital Signs: Temp: 98.7 F (37.1 C) (09/09 0338) Temp Source: Oral (09/09 0338) BP: 124/67 (09/09 0338) Pulse Rate: 82 (09/09 0338)  Labs:  Recent Labs  05/14/17 0018  WBC 10.0  HGB 11.7*  PLT 268  CREATININE 0.65   Estimated Creatinine Clearance: 74.9 mL/min (by C-G formula based on SCr of 0.65 mg/dL).  No results for input(s): VANCOTROUGH, VANCOPEAK, VANCORANDOM, GENTTROUGH, GENTPEAK, GENTRANDOM, TOBRATROUGH, TOBRAPEAK, TOBRARND, AMIKACINPEAK, AMIKACINTROU, AMIKACIN in the last 72 hours.   Microbiology: Recent Results (from the past 720 hour(s))  Blood culture (routine x 2)     Status: None (Preliminary result)   Collection Time: 05/14/17 12:18 AM  Result Value Ref Range Status   Specimen Description LEFT ANTECUBITAL  Final   Special Requests Blood Culture adequate volume  Final   Culture NO GROWTH < 12 HOURS  Final   Report Status PENDING  Incomplete  Blood culture (routine x 2)     Status: None (Preliminary result)   Collection Time: 05/14/17 12:20 AM  Result Value Ref Range Status   Specimen Description BLOOD LEFT HAND  Final   Special Requests Blood Culture adequate volume  Final   Culture NO GROWTH < 12 HOURS  Final   Report Status PENDING  Incomplete  MRSA PCR Screening     Status: None   Collection Time: 05/14/17  3:44 AM  Result Value Ref Range Status   MRSA by PCR NEGATIVE NEGATIVE Final    Comment:        The GeneXpert MRSA Assay (FDA approved for NASAL specimens only), is one component of a comprehensive MRSA colonization surveillance program. It is not intended to  diagnose MRSA infection nor to guide or monitor treatment for MRSA infections.    Medical History: Past Medical History:  Diagnosis Date  . A-fib ([place])   . Abdominal pain   . Adenomatous colon polyp   . Arthritis   . Asthma   . CAD (coronary artery disease)   . Cancer ([place])    skin, nose  . Cataract    bilateral  . Chronic LBP   . COPD (chronic obstructive pulmonary disease) ([place])   . Decreased libido   . Diverticulosis of colon   . Esophageal stricture   . Fatigue   . GERD (gastroesophageal reflux disease)   . History of small bowel obstruction   . Hyperlipidemia   . Hypertension   . Hypertrophy of prostate with urinary obstruction and other lower urinary tract symptoms (LUTS)   . Palpitations   . Peri-rectal abscess   . Personal history of renal calculi   . Prostatitis, acute   . Tobacco abuse    Medications:  Cefepime 9/09>>  Assessment: 76 yo male SNF resident with hx of COPD, s/p hospital discharge 8/20; seen in the ED for increased SOB. Pt diagnosed with pneumonia at SNF and received Levaquin x 1 dose per report. Pharmacy has been asked to dose Vancomycin for HCAP.  Goal of Therapy:  Vancomycin troughs 15-20 mcg/ml  Plan:  Vancomycin 1000mg  IV q12h Check trough at steady state Cefepime 1gm  IV q8h Monitor  labs, renal fxn, progress and c/s Deescalate ABX when improved / appropriate.     Hart Robinsons A, RPH 05/14/2017,9:30 AM

## 2017-05-15 DIAGNOSIS — I4891 Unspecified atrial fibrillation: Secondary | ICD-10-CM

## 2017-05-15 DIAGNOSIS — E1149 Type 2 diabetes mellitus with other diabetic neurological complication: Secondary | ICD-10-CM

## 2017-05-15 LAB — BLOOD CULTURE ID PANEL (REFLEXED)
Acinetobacter baumannii: NOT DETECTED
CANDIDA GLABRATA: NOT DETECTED
CANDIDA KRUSEI: NOT DETECTED
CANDIDA PARAPSILOSIS: NOT DETECTED
CARBAPENEM RESISTANCE: NOT DETECTED
Candida albicans: NOT DETECTED
Candida tropicalis: NOT DETECTED
Enterobacter cloacae complex: NOT DETECTED
Enterobacteriaceae species: NOT DETECTED
Enterococcus species: NOT DETECTED
Escherichia coli: NOT DETECTED
Haemophilus influenzae: NOT DETECTED
KLEBSIELLA OXYTOCA: NOT DETECTED
KLEBSIELLA PNEUMONIAE: NOT DETECTED
LISTERIA MONOCYTOGENES: NOT DETECTED
Methicillin resistance: DETECTED — AB
Neisseria meningitidis: NOT DETECTED
PSEUDOMONAS AERUGINOSA: NOT DETECTED
Proteus species: NOT DETECTED
SERRATIA MARCESCENS: NOT DETECTED
STAPHYLOCOCCUS SPECIES: DETECTED — AB
STREPTOCOCCUS AGALACTIAE: NOT DETECTED
STREPTOCOCCUS SPECIES: NOT DETECTED
Staphylococcus aureus (BCID): NOT DETECTED
Streptococcus pneumoniae: NOT DETECTED
Streptococcus pyogenes: NOT DETECTED
Vancomycin resistance: NOT DETECTED

## 2017-05-15 LAB — GLUCOSE, CAPILLARY
GLUCOSE-CAPILLARY: 251 mg/dL — AB (ref 65–99)
GLUCOSE-CAPILLARY: 271 mg/dL — AB (ref 65–99)
GLUCOSE-CAPILLARY: 303 mg/dL — AB (ref 65–99)
Glucose-Capillary: 278 mg/dL — ABNORMAL HIGH (ref 65–99)

## 2017-05-15 LAB — HIV ANTIBODY (ROUTINE TESTING W REFLEX): HIV Screen 4th Generation wRfx: NONREACTIVE

## 2017-05-15 MED ORDER — POLYETHYLENE GLYCOL 3350 17 G PO PACK
17.0000 g | PACK | Freq: Every day | ORAL | Status: DC
  Administered 2017-05-15 – 2017-05-17 (×3): 17 g via ORAL
  Filled 2017-05-15 (×3): qty 1

## 2017-05-15 MED ORDER — LORAZEPAM 0.5 MG PO TABS
0.5000 mg | ORAL_TABLET | Freq: Four times a day (QID) | ORAL | Status: DC | PRN
  Administered 2017-05-15: 0.5 mg via ORAL
  Filled 2017-05-15: qty 1

## 2017-05-15 MED ORDER — BISACODYL 5 MG PO TBEC
10.0000 mg | DELAYED_RELEASE_TABLET | Freq: Once | ORAL | Status: AC
  Administered 2017-05-15: 10 mg via ORAL
  Filled 2017-05-15: qty 2

## 2017-05-15 MED ORDER — HYDROCODONE-ACETAMINOPHEN 5-325 MG PO TABS
1.0000 | ORAL_TABLET | Freq: Four times a day (QID) | ORAL | Status: DC | PRN
  Administered 2017-05-15 – 2017-05-17 (×4): 2 via ORAL
  Filled 2017-05-15 (×4): qty 2

## 2017-05-15 NOTE — Clinical Social Work Note (Signed)
Patient was discharged from [facility] on Sunday due to starting his copay days of $167.00 per day. Patient is aware. Facility to speak with daughter about financial options.     Quinley Nesler, Clydene Pugh, LCSW

## 2017-05-15 NOTE — Progress Notes (Signed)
Central telemetry called this nurse and reported a 5 beat run of V-tach, MD notified.

## 2017-05-15 NOTE — Progress Notes (Signed)
Lab called this nurse, Aerobic Vial gram positive for cocci clusters, MD notified.

## 2017-05-15 NOTE — Progress Notes (Signed)
Pt anaerobic blood culture positive for gram positive cocci. MD, Tamala Julian, paged with lab results at [facility]. Continue to monitor.

## 2017-05-15 NOTE — Progress Notes (Signed)
Blood culture positive. ID as Staphylococcus species and meth. Species. MD notified. Pt refusing all lab draws.

## 2017-05-15 NOTE — Progress Notes (Signed)
Inpatient Diabetes Program Recommendations  AACE/ADA: New Consensus Statement on Inpatient Glycemic Control (2015)  Target Ranges:  Prepandial:   less than 140 mg/dL      Peak postprandial:   less than 180 mg/dL (1-2 hours)      Critically ill patients:  140 - 180 mg/dL   Results for Andrew [name], Andrew [name] (MRN [id number]) as of 05/15/2017 13:30  Ref. Range 05/14/2017 08:22 05/14/2017 11:28 05/14/2017 18:46 05/14/2017 21:12 05/15/2017 07:23 05/15/2017 11:12  Glucose-Capillary Latest Ref Range: 65 - 99 mg/dL 225 (H) 291 (H) 279 (H) 246 (H) 251 (H) 271 (H)   Review of Glycemic Control  Diabetes history: DM2 Outpatient Diabetes medications: Glipizide 5 mg QAM, Glumetza 1000 mg QAM, Onglyza 5 mg daily Current orders for Inpatient glycemic control: Novolog 0-9 units TID with meals  Inpatient Diabetes Program Recommendations: Insulin - Basal: Please consider ordering Lantus 8 units QHS. Correction (SSI): Please consider ordering Novolog 0-5 units QHS for bedtime correction. Insulin - Meal Coverage: If steroids are continued, please consider ordering Novolog 4 units TID with meals for meal coverage if patient eats at least 50% of meals.  Thanks, Barnie Alderman, RN, MSN, CDE Diabetes Coordinator Inpatient Diabetes Program [phone] (Team Pager from 8am to 5pm)

## 2017-05-15 NOTE — Progress Notes (Signed)
PHARMACY - PHYSICIAN COMMUNICATION CRITICAL VALUE ALERT - BLOOD CULTURE IDENTIFICATION (BCID)  Results for orders placed or performed during the hospital encounter of 05/13/17  Blood Culture ID Panel (Reflexed) (Collected: 05/14/2017 12:18 AM)  Result Value Ref Range   Enterococcus species NOT DETECTED NOT DETECTED   Vancomycin resistance NOT DETECTED NOT DETECTED   Listeria monocytogenes NOT DETECTED NOT DETECTED   Staphylococcus species DETECTED (A) NOT DETECTED   Staphylococcus aureus NOT DETECTED NOT DETECTED   Methicillin resistance DETECTED (A) NOT DETECTED   Streptococcus species NOT DETECTED NOT DETECTED   Streptococcus agalactiae NOT DETECTED NOT DETECTED   Streptococcus pneumoniae NOT DETECTED NOT DETECTED   Streptococcus pyogenes NOT DETECTED NOT DETECTED   Acinetobacter baumannii NOT DETECTED NOT DETECTED   Enterobacteriaceae species NOT DETECTED NOT DETECTED   Enterobacter cloacae complex NOT DETECTED NOT DETECTED   Escherichia coli NOT DETECTED NOT DETECTED   Klebsiella oxytoca NOT DETECTED NOT DETECTED   Klebsiella pneumoniae NOT DETECTED NOT DETECTED   Proteus species NOT DETECTED NOT DETECTED   Serratia marcescens NOT DETECTED NOT DETECTED   Carbapenem resistance NOT DETECTED NOT DETECTED   Haemophilus influenzae NOT DETECTED NOT DETECTED   Neisseria meningitidis NOT DETECTED NOT DETECTED   Pseudomonas aeruginosa NOT DETECTED NOT DETECTED   Candida albicans NOT DETECTED NOT DETECTED   Candida glabrata NOT DETECTED NOT DETECTED   Candida krusei NOT DETECTED NOT DETECTED   Candida parapsilosis NOT DETECTED NOT DETECTED   Candida tropicalis NOT DETECTED NOT DETECTED    Name of physician (or Provider) Contacted: Dr Roderic Palau  Changes to prescribed antibiotics required: (n/a) Continue Vancomycin and Cefepime  Hart Robinsons A 05/15/2017  12:12 PM

## 2017-05-15 NOTE — Progress Notes (Signed)
PROGRESS NOTE    Andrew [name]  OXB:[id number] DOB: [birth year] DOA: 05/13/2017 PCP: System, Provider Not In    Brief Narrative:  76 year old male with advanced COPD who was recently hospitalized last month and required intubation, was sent to the hospital from a nursing home with shortness of breath and wheezing. He found to have COPD exacerbation and pneumonia. Started on antibiotics and IV steroids. Blood cultures returned positive for possible MRSA. Further identification is pending. He is on appropriate antibiotics.   Assessment & Plan:   Principal Problem:   Hospital acquired PNA Active Problems:   Diabetes mellitus ([name])   Essential hypertension   ATRIAL FIBRILLATION   CHRONIC OBSTRUCTIVE PULMONARY DISEASE, ACUTE EXACERBATION   LOW BACK PAIN, CHRONIC   AAA (abdominal aortic aneurysm) ([name])   1. Healthcare associated pneumonia. Currently on vancomycin and cefepime. Still has an increased oxygen requirement from baseline. Continue current treatments 2. Positive blood culture. Informed bilaterally patient has positive blood culture for GPC. PCR indicated staph aureus and possible MRSA. Repeat blood cultures. Continue vancomycin. Follow-up further identifications. 3. Acute on chronic respiratory failure. Related to pneumonia and COPD. Try to wean down oxygen back to baseline requirement as tolerated. 4. COPD exacerbation. Currently on steroids, antibiotics and bronchodilators. Continues to have some mild wheeze. Patient feels that his breathing is approaching baseline, although he still appears quite dyspneic. 5. Atrial fibrillation. Currently stable. Anticoagulated with eliquis 6. Diabetes. Continue on sliding scale insulin. Blood sugars have been stable. 7. Abdominal aortic aneurysm. Continue outpatient surveillance.   DVT prophylaxis: eliquis Code Status: Full code Family Communication: Discussed with patient Disposition Plan: Return to nursing facility on  discharge   Consultants:     Procedures:     Antimicrobials:   Cefepime 9/9>>  Vancomycin 9/9>>    Subjective: Feels that shortness of breath is approaching baseline. Has some mild productive cough  Objective: Vitals:   05/15/17 0658 05/15/17 0750 05/15/17 1424 05/15/17 1433  BP: (!) 111/52   (!) 155/55  Pulse: 68   76  Resp: 20   18  Temp: 98.1 F (36.7 C)   97.7 F (36.5 C)  TempSrc: Oral   Oral  SpO2: 97% 96% 96% 95%  Weight:      Height:        Intake/Output Summary (Last 24 hours) at 05/15/17 1930 Last data filed at 05/15/17 1700  Gross per 24 hour  Intake             1803 ml  Output              801 ml  Net             1002 ml   Filed Weights   05/13/17 2208 05/14/17 0338  Weight: 88 kg (194 lb) 81.8 kg (180 lb 5.4 oz)    Examination:  General exam: Appears calm and comfortable  Respiratory system: Increased respiratory effort. Mild wheeze bilaterally Cardiovascular system: S1 & S2 heard, RRR. No JVD, murmurs, rubs, gallops or clicks. No pedal edema. Gastrointestinal system: Abdomen is nondistended, soft and nontender. No organomegaly or masses felt. Normal bowel sounds heard. Central nervous system: Alert and oriented. No focal neurological deficits. Extremities: Symmetric 5 x 5 power. Skin: No rashes, lesions or ulcers Psychiatry: Judgement and insight appear normal. Mood & affect appropriate.     Data Reviewed: I have personally reviewed following labs and imaging studies  CBC:  Recent Labs Lab 05/14/17 0018  WBC 10.0  NEUTROABS 6.8  HGB 11.7*  HCT 35.6*  MCV 80.7  PLT 939   Basic Metabolic Panel:  Recent Labs Lab 05/14/17 0018  NA 135  K 4.2  CL 98*  CO2 29  GLUCOSE 142*  BUN 8  CREATININE 0.65  CALCIUM 9.0   GFR: Estimated Creatinine Clearance: 74.9 mL/min (by C-G formula based on SCr of 0.65 mg/dL). Liver Function Tests: No results for input(s): AST, ALT, ALKPHOS, BILITOT, PROT, ALBUMIN in the last 168 hours. No  results for input(s): LIPASE, AMYLASE in the last 168 hours. No results for input(s): AMMONIA in the last 168 hours. Coagulation Profile: No results for input(s): INR, PROTIME in the last 168 hours. Cardiac Enzymes:  Recent Labs Lab 05/14/17 0018  TROPONINI <0.03   BNP (last 3 results) No results for input(s): PROBNP in the last 8760 hours. HbA1C: No results for input(s): HGBA1C in the last 72 hours. CBG:  Recent Labs Lab 05/14/17 1846 05/14/17 2112 05/15/17 0723 05/15/17 1112 05/15/17 1631  GLUCAP 279* 246* 251* 271* 303*   Lipid Profile: No results for input(s): CHOL, HDL, LDLCALC, TRIG, CHOLHDL, LDLDIRECT in the last 72 hours. Thyroid Function Tests: No results for input(s): TSH, T4TOTAL, FREET4, T3FREE, THYROIDAB in the last 72 hours. Anemia Panel: No results for input(s): VITAMINB12, FOLATE, FERRITIN, TIBC, IRON, RETICCTPCT in the last 72 hours. Sepsis Labs:  Recent Labs Lab 05/13/17 2341  LATICACIDVEN 1.40    Recent Results (from the past 240 hour(s))  Blood culture (routine x 2)     Status: None (Preliminary result)   Collection Time: 05/14/17 12:18 AM  Result Value Ref Range Status   Specimen Description LEFT ANTECUBITAL  Final   Special Requests Blood Culture adequate volume  Final   Culture  Setup Time   Final    GRAM POSITIVE COCCI Gram Stain Report Called to,Read Back By and Verified With: HARRIS,B. AT 0015 ON 05/15/2017 BY EVA ANAEROBIC BOTTLE ONLY Performed at [place] 05/15/2017 T. TYSOR CRITICAL RESULT CALLED TO, READ BACK BY AND VERIFIED WITH: B. HARRIS,RN 0300 05/15/2017 T. Rosalia Hammers    Culture GRAM POSITIVE COCCI  Final   Report Status PENDING  Incomplete  Blood Culture ID Panel (Reflexed)     Status: Abnormal   Collection Time: 05/14/17 12:18 AM  Result Value Ref Range Status   Enterococcus species NOT DETECTED NOT DETECTED Final   Vancomycin resistance NOT DETECTED NOT DETECTED Final    Listeria monocytogenes NOT DETECTED NOT DETECTED Final   Staphylococcus species DETECTED (A) NOT DETECTED Final    Comment: CRITICAL RESULT CALLED TO, READ BACK BY AND VERIFIED WITH: B. HARRIS,RN 9233 05/15/2017 T. TYSOR    Staphylococcus aureus NOT DETECTED NOT DETECTED Final   Methicillin resistance DETECTED (A) NOT DETECTED Final    Comment: CRITICAL RESULT CALLED TO, READ BACK BY AND VERIFIED WITH: B. HARRIS,RN 0076 05/15/2017 T. TYSOR    Streptococcus species NOT DETECTED NOT DETECTED Final   Streptococcus agalactiae NOT DETECTED NOT DETECTED Final   Streptococcus pneumoniae NOT DETECTED NOT DETECTED Final   Streptococcus pyogenes NOT DETECTED NOT DETECTED Final   Acinetobacter baumannii NOT DETECTED NOT DETECTED Final   Enterobacteriaceae species NOT DETECTED NOT DETECTED Final   Enterobacter cloacae complex NOT DETECTED NOT DETECTED Final   Escherichia coli NOT DETECTED NOT DETECTED Final   Klebsiella oxytoca NOT DETECTED NOT DETECTED Final   Klebsiella pneumoniae NOT DETECTED NOT DETECTED Final   Proteus species NOT DETECTED NOT DETECTED Final  Serratia marcescens NOT DETECTED NOT DETECTED Final   Carbapenem resistance NOT DETECTED NOT DETECTED Final   Haemophilus influenzae NOT DETECTED NOT DETECTED Final   Neisseria meningitidis NOT DETECTED NOT DETECTED Final   Pseudomonas aeruginosa NOT DETECTED NOT DETECTED Final   Candida albicans NOT DETECTED NOT DETECTED Final   Candida glabrata NOT DETECTED NOT DETECTED Final   Candida krusei NOT DETECTED NOT DETECTED Final   Candida parapsilosis NOT DETECTED NOT DETECTED Final   Candida tropicalis NOT DETECTED NOT DETECTED Final  Blood culture (routine x 2)     Status: None (Preliminary result)   Collection Time: 05/14/17 12:20 AM  Result Value Ref Range Status   Specimen Description BLOOD LEFT HAND  Final   Special Requests Blood Culture adequate volume  Final   Culture  Setup Time   Final    GRAM POSITIVE COCCI IN CLUSTERS  AEROBIC BOTTLE ONLY Gram Stain Report Called to,Read Back By and Verified With: HOBBS,A AT 1125 BY HUFFINES,S ON 05/15/17.    Culture GRAM POSITIVE COCCI  Final   Report Status PENDING  Incomplete  MRSA PCR Screening     Status: None   Collection Time: 05/14/17  3:44 AM  Result Value Ref Range Status   MRSA by PCR NEGATIVE NEGATIVE Final    Comment:        The GeneXpert MRSA Assay (FDA approved for NASAL specimens only), is one component of a comprehensive MRSA colonization surveillance program. It is not intended to diagnose MRSA infection nor to guide or monitor treatment for MRSA infections.          Radiology Studies: Dg Chest 2 View  Result Date: 05/13/2017 CLINICAL DATA:  Shortness of breath EXAM: CHEST  2 VIEW COMPARISON:  04/21/2017, 04/10/2010 FINDINGS: Mild cardiomegaly. Increased bibasilar opacity since the prior study. No large pleural effusion. No pneumothorax. IMPRESSION: Increased bibasilar opacities suspicious for bibasilar pneumonia. Electronically Signed   By: Donavan Foil M.D.   On: 05/13/2017 23:42        Scheduled Meds: . apixaban  2.5 mg Oral BID  . diltiazem  300 mg Oral Daily  . guaiFENesin  1,200 mg Oral BID  . insulin aspart  0-9 Units Subcutaneous TID WC  . ipratropium-albuterol  3 mL Nebulization Q6H  . methylPREDNISolone (SOLU-MEDROL) injection  80 mg Intravenous Q12H  . montelukast  10 mg Oral Daily  . nicotine  14 mg Transdermal Daily  . pantoprazole  40 mg Oral Daily  . rosuvastatin  20 mg Oral Daily  . sodium chloride flush  3 mL Intravenous Q12H   Continuous Infusions: . sodium chloride    . ceFEPime (MAXIPIME) IV Stopped (05/15/17 1355)  . vancomycin Stopped (05/15/17 0855)     LOS: 1 day    Time spent: 37mins    Veida Spira, MD [facility] Pager [phone]  If 7PM-7AM, please contact night-coverage [email or web address] Password [email or web address] 05/15/2017, 7:30 PM \

## 2017-05-16 LAB — GLUCOSE, CAPILLARY
GLUCOSE-CAPILLARY: 294 mg/dL — AB (ref 65–99)
GLUCOSE-CAPILLARY: 321 mg/dL — AB (ref 65–99)
GLUCOSE-CAPILLARY: 335 mg/dL — AB (ref 65–99)
GLUCOSE-CAPILLARY: 250 mg/dL — AB (ref 65–99)

## 2017-05-16 LAB — CBC
HEMATOCRIT: 33.7 % — AB (ref 39.0–52.0)
Hemoglobin: 11.2 g/dL — ABNORMAL LOW (ref 13.0–17.0)
MCH: 26.5 pg (ref 26.0–34.0)
MCHC: 33.2 g/dL (ref 30.0–36.0)
MCV: 79.7 fL (ref 78.0–100.0)
Platelets: 291 10*3/uL (ref 150–400)
RBC: 4.23 MIL/uL (ref 4.22–5.81)
RDW: 16.7 % — AB (ref 11.5–15.5)
WBC: 13 10*3/uL — AB (ref 4.0–10.5)

## 2017-05-16 LAB — BASIC METABOLIC PANEL
Anion gap: 10 (ref 5–15)
BUN: 24 mg/dL — ABNORMAL HIGH (ref 6–20)
CALCIUM: 8.7 mg/dL — AB (ref 8.9–10.3)
CHLORIDE: 96 mmol/L — AB (ref 101–111)
CO2: 27 mmol/L (ref 22–32)
CREATININE: 0.73 mg/dL (ref 0.61–1.24)
GFR calc non Af Amer: >60 mL/min (ref >60)
Glucose, Bld: 293 mg/dL — ABNORMAL HIGH (ref 65–99)
Potassium: 4.4 mmol/L (ref 3.5–5.1)
SODIUM: 133 mmol/L — AB (ref 135–145)

## 2017-05-16 MED ORDER — LEVOFLOXACIN 750 MG PO TABS
750.0000 mg | ORAL_TABLET | Freq: Every day | ORAL | Status: DC
  Administered 2017-05-16 – 2017-05-17 (×2): 750 mg via ORAL
  Filled 2017-05-16 (×2): qty 1

## 2017-05-16 MED ORDER — PREDNISONE 20 MG PO TABS
40.0000 mg | ORAL_TABLET | Freq: Every day | ORAL | Status: DC
  Administered 2017-05-17: 40 mg via ORAL
  Filled 2017-05-16: qty 2

## 2017-05-16 NOTE — Progress Notes (Signed)
Inpatient Diabetes Program Recommendations  AACE/ADA: New Consensus Statement on Inpatient Glycemic Control (2015)  Target Ranges:  Prepandial:   less than 140 mg/dL      Peak postprandial:   less than 180 mg/dL (1-2 hours)      Critically ill patients:  140 - 180 mg/dL   Results for ZAKARIYAH, FREIMARK (MRN [id number]) as of 05/16/2017 09:47  Ref. Range 05/15/2017 07:23 05/15/2017 11:12 05/15/2017 16:31 05/15/2017 20:16 05/16/2017 07:43  Glucose-Capillary Latest Ref Range: 65 - 99 mg/dL 251 (H) 271 (H) 303 (H) 278 (H) 294 (H)   Review of Glycemic Control  Diabetes history: DM2 Outpatient Diabetes medications: Glipizide 5 mg QAM, Glumetza 1000 mg QAM, Onglyza 5 mg daily Current orders for Inpatient glycemic control: Novolog 0-9 units TID with meals  Inpatient Diabetes Program Recommendations: Insulin - Basal: Glucose has ranged from 251-301 mg/dl over the past 24 hours and fasting glucose is 294 mg/dl. Please consider ordering Lantus 8 units QHS. Correction (SSI): Please consider ordering Novolog 0-5 units QHS for bedtime correction. Insulin - Meal Coverage: If steroids are continued as ordered, please consider ordering Novolog 4 units TID with meals for meal coverage if patient eats at least 50% of meals.  Thanks, Barnie Alderman, RN, MSN, CDE Diabetes Coordinator Inpatient Diabetes Program [phone] (Team Pager from 8am to 5pm)

## 2017-05-16 NOTE — Progress Notes (Signed)
PROGRESS NOTE    DURANTE VIOLETT  BHA:[id number] DOB: [birth year] DOA: 05/13/2017 PCP: System, Provider Not In    Brief Narrative:  76 year old male with advanced COPD who was recently hospitalized last month and required intubation, was sent to the hospital from a nursing home with shortness of breath and wheezing. He found to have COPD exacerbation and pneumonia. Started on antibiotics and IV steroids. Blood cultures returned positive for staph epidermidis. Repeat blood cultures have been sent. If he showed no growth, and positive blood cultures are likely contaminants. Overall breathing is slowly improving. Anticipate discharge in the next 1-2 days.   Assessment & Plan:   Principal Problem:   Hospital acquired PNA Active Problems:   Diabetes mellitus ([place])   Essential hypertension   ATRIAL FIBRILLATION   CHRONIC OBSTRUCTIVE PULMONARY DISEASE, ACUTE EXACERBATION   LOW BACK PAIN, CHRONIC   AAA (abdominal aortic aneurysm) ([place])   1. Healthcare associated pneumonia. Currently on vancomycin and cefepime. He is slowly improving. We will de-escalate antibiotics to levofloxacin. 2. Positive blood culture. Patient has positive blood cultures for staph epidermidis. Clinically, he is afebrile and does not appear to have a bacteremia. Case discussed with Dr. Baxter Flattery on call for infectious disease recommended to de-escalate antibiotics from vancomycin and cefepime repeat blood cultures were sent this morning. If these do not show any specific growth, it is likely that positive blood culture likely a contaminant and no further treatment is needed..  3. Acute on chronic respiratory failure. Related to pneumonia and COPD. Try to wean down oxygen back to baseline requirement as tolerated. 4. COPD exacerbation. Currently on IV steroids, antibiotics and bronchodilators. Patient feels that his breathing is approaching baseline, although he still appears quite dyspneic. We'll transition to prednisone taper.   5. Atrial fibrillation. Currently stable. Anticoagulated with eliquis 6. Diabetes. Continue on sliding scale insulin. Blood sugars have been stable. 7. Abdominal aortic aneurysm. Continue outpatient surveillance. 8. Tremors. Suspect this is related to bronchodilators and high-dose steroids.    DVT prophylaxis: eliquis Code Status: Full code Family Communication: Discussed with patient.I also discussed the patient's care with his daughter over the phone. She was quite frustrated that the patient could not return to the skilled nursing facility and was concerned regarding his overall health. She understands that he has chronic illnesses, but was having a difficult time coming to terms with his poor prognosis and health fragility.  Disposition Plan: Physical therapy has seen the patient and has recommended skilled nursing facility. Unfortunately, patient does not appear to have adequate insurance benefits and will likely need to discharge home once he is ready   Consultants:     Procedures:     Antimicrobials:   Cefepime 9/9>> 9/11  Vancomycin 9/9>> 9/11  Levofloxacin 9/11>>   Subjective: Overall, feels that his cough is improving. He feels that respiratory status is approaching baseline.  Objective: Vitals:   05/16/17 0729 05/16/17 1309 05/16/17 1500 05/16/17 1939  BP:   121/67   Pulse:   80   Resp:   19   Temp:   98.3 F (36.8 C)   TempSrc:   Oral   SpO2: 99% 94% 96% 97%  Weight:      Height:        Intake/Output Summary (Last 24 hours) at 05/16/17 2105 Last data filed at 05/16/17 1821  Gross per 24 hour  Intake             2103 ml  Output  3300 ml  Net            -1197 ml   Filed Weights   05/13/17 2208 05/14/17 0338 05/15/17 2140  Weight: 88 kg (194 lb) 81.8 kg (180 lb 5.4 oz) 83.4 kg (183 lb 13.8 oz)    Examination:  General exam: Appears calm and comfortable  Respiratory system: Increased respiratory effort. Clear bilaterally Cardiovascular  system: S1 & S2 heard, RRR. No JVD, murmurs, rubs, gallops or clicks. No pedal edema. Gastrointestinal system: Abdomen is nondistended, soft and nontender. No organomegaly or masses felt. Normal bowel sounds heard. Central nervous system: Alert and oriented. No focal neurological deficits. Extremities: Symmetric 5 x 5 power. Skin: No rashes, lesions or ulcers Psychiatry: Judgement and insight appear normal. Mood & affect appropriate.     Data Reviewed: I have personally reviewed following labs and imaging studies  CBC:  Recent Labs Lab 05/14/17 0018 05/16/17 0334  WBC 10.0 13.0*  NEUTROABS 6.8  --   HGB 11.7* 11.2*  HCT 35.6* 33.7*  MCV 80.7 79.7  PLT 268 016   Basic Metabolic Panel:  Recent Labs Lab 05/14/17 0018 05/16/17 0334  NA 135 133*  K 4.2 4.4  CL 98* 96*  CO2 29 27  GLUCOSE 142* 293*  BUN 8 24*  CREATININE 0.65 0.73  CALCIUM 9.0 8.7*   GFR: Estimated Creatinine Clearance: 75.7 mL/min (by C-G formula based on SCr of 0.73 mg/dL). Liver Function Tests: No results for input(s): AST, ALT, ALKPHOS, BILITOT, PROT, ALBUMIN in the last 168 hours. No results for input(s): LIPASE, AMYLASE in the last 168 hours. No results for input(s): AMMONIA in the last 168 hours. Coagulation Profile: No results for input(s): INR, PROTIME in the last 168 hours. Cardiac Enzymes:  Recent Labs Lab 05/14/17 0018  TROPONINI <0.03   BNP (last 3 results) No results for input(s): PROBNP in the last 8760 hours. HbA1C: No results for input(s): HGBA1C in the last 72 hours. CBG:  Recent Labs Lab 05/15/17 1631 05/15/17 2016 05/16/17 0743 05/16/17 1120 05/16/17 1611  GLUCAP 303* 278* 294* 321* 335*   Lipid Profile: No results for input(s): CHOL, HDL, LDLCALC, TRIG, CHOLHDL, LDLDIRECT in the last 72 hours. Thyroid Function Tests: No results for input(s): TSH, T4TOTAL, FREET4, T3FREE, THYROIDAB in the last 72 hours. Anemia Panel: No results for input(s): VITAMINB12, FOLATE,  FERRITIN, TIBC, IRON, RETICCTPCT in the last 72 hours. Sepsis Labs:  Recent Labs Lab 05/13/17 2341  LATICACIDVEN 1.40    Recent Results (from the past 240 hour(s))  Blood culture (routine x 2)     Status: Abnormal (Preliminary result)   Collection Time: 05/14/17 12:18 AM  Result Value Ref Range Status   Specimen Description LEFT ANTECUBITAL  Final   Special Requests Blood Culture adequate volume  Final   Culture  Setup Time   Final    GRAM POSITIVE COCCI Gram Stain Report Called to,Read Back By and Verified With: HARRIS,B. AT 0015 ON 05/15/2017 BY EVA ANAEROBIC BOTTLE ONLY Performed at [place] 05/15/2017 T. TYSOR CRITICAL RESULT CALLED TO, READ BACK BY AND VERIFIED WITH: B. HARRIS,RN 0109 05/15/2017 T. TYSOR    Culture STAPHYLOCOCCUS EPIDERMIDIS (A)  Final   Report Status PENDING  Incomplete  Blood Culture ID Panel (Reflexed)     Status: Abnormal   Collection Time: 05/14/17 12:18 AM  Result Value Ref Range Status   Enterococcus species NOT DETECTED NOT DETECTED Final   Vancomycin resistance NOT DETECTED NOT  DETECTED Final   Listeria monocytogenes NOT DETECTED NOT DETECTED Final   Staphylococcus species DETECTED (A) NOT DETECTED Final    Comment: CRITICAL RESULT CALLED TO, READ BACK BY AND VERIFIED WITH: B. HARRIS,RN [id number] 05/15/2017 T. TYSOR    Staphylococcus aureus NOT DETECTED NOT DETECTED Final   Methicillin resistance DETECTED (A) NOT DETECTED Final    Comment: CRITICAL RESULT CALLED TO, READ BACK BY AND VERIFIED WITH: B. HARRIS,RN [id number] 05/15/2017 T. TYSOR    Streptococcus species NOT DETECTED NOT DETECTED Final   Streptococcus agalactiae NOT DETECTED NOT DETECTED Final   Streptococcus pneumoniae NOT DETECTED NOT DETECTED Final   Streptococcus pyogenes NOT DETECTED NOT DETECTED Final   Acinetobacter baumannii NOT DETECTED NOT DETECTED Final   Enterobacteriaceae species NOT DETECTED NOT DETECTED Final   Enterobacter  cloacae complex NOT DETECTED NOT DETECTED Final   Escherichia coli NOT DETECTED NOT DETECTED Final   Klebsiella oxytoca NOT DETECTED NOT DETECTED Final   Klebsiella pneumoniae NOT DETECTED NOT DETECTED Final   Proteus species NOT DETECTED NOT DETECTED Final   Serratia marcescens NOT DETECTED NOT DETECTED Final   Carbapenem resistance NOT DETECTED NOT DETECTED Final   Haemophilus influenzae NOT DETECTED NOT DETECTED Final   Neisseria meningitidis NOT DETECTED NOT DETECTED Final   Pseudomonas aeruginosa NOT DETECTED NOT DETECTED Final   Candida albicans NOT DETECTED NOT DETECTED Final   Candida glabrata NOT DETECTED NOT DETECTED Final   Candida krusei NOT DETECTED NOT DETECTED Final   Candida parapsilosis NOT DETECTED NOT DETECTED Final   Candida tropicalis NOT DETECTED NOT DETECTED Final  Blood culture (routine x 2)     Status: Abnormal (Preliminary result)   Collection Time: 05/14/17 12:20 AM  Result Value Ref Range Status   Specimen Description BLOOD LEFT HAND  Final   Special Requests Blood Culture adequate volume  Final   Culture  Setup Time   Final    GRAM POSITIVE COCCI IN CLUSTERS AEROBIC BOTTLE ONLY Gram Stain Report Called to,Read Back By and Verified With: HOBBS,A AT 8938 BY HUFFINES,S ON 05/15/17.    Culture (A)  Final    STAPHYLOCOCCUS EPIDERMIDIS SUSCEPTIBILITIES TO FOLLOW Performed at [facility], [place] [address] [place], [place] [address]    Report Status PENDING  Incomplete  MRSA PCR Screening     Status: None   Collection Time: 05/14/17  3:44 AM  Result Value Ref Range Status   MRSA by PCR NEGATIVE NEGATIVE Final    Comment:        The GeneXpert MRSA Assay (FDA approved for NASAL specimens only), is one component of a comprehensive MRSA colonization surveillance program. It is not intended to diagnose MRSA infection nor to guide or monitor treatment for MRSA infections.   Culture, blood (routine x 2)     Status: None (Preliminary result)    Collection Time: 05/16/17  3:25 AM  Result Value Ref Range Status   Specimen Description BLOOD RIGHT ARM  Final   Special Requests   Final    BOTTLES DRAWN AEROBIC AND ANAEROBIC Blood Culture adequate volume   Culture NO GROWTH < 12 HOURS  Final   Report Status PENDING  Incomplete  Culture, blood (routine x 2)     Status: None (Preliminary result)   Collection Time: 05/16/17  3:34 AM  Result Value Ref Range Status   Specimen Description BLOOD RIGHT ARM  Final   Special Requests   Final    BOTTLES DRAWN AEROBIC AND ANAEROBIC Blood Culture adequate  volume   Culture NO GROWTH < 12 HOURS  Final   Report Status PENDING  Incomplete         Radiology Studies: No results found.      Scheduled Meds: . apixaban  2.5 mg Oral BID  . diltiazem  300 mg Oral Daily  . guaiFENesin  1,200 mg Oral BID  . insulin aspart  0-9 Units Subcutaneous TID WC  . ipratropium-albuterol  3 mL Nebulization Q6H  . levofloxacin  750 mg Oral Daily  . montelukast  10 mg Oral Daily  . nicotine  14 mg Transdermal Daily  . pantoprazole  40 mg Oral Daily  . polyethylene glycol  17 g Oral Daily  . [START ON 05/17/2017] predniSONE  40 mg Oral Q breakfast  . rosuvastatin  20 mg Oral Daily  . sodium chloride flush  3 mL Intravenous Q12H   Continuous Infusions: . sodium chloride       LOS: 2 days    Time spent: 15mins    MEMON,JEHANZEB, MD [facility] Pager [phone]  If 7PM-7AM, please contact night-coverage [email or web address] Password [email or web address] 05/16/2017, 9:05 PM \

## 2017-05-16 NOTE — Care Management Note (Signed)
Case Management Note  Patient Details  Name: Andrew [name] MRN: [id number] Date of Birth: [birth year]  Subjective/Objective:                  Adm with Pneumonia. Recent stay at [facility], discharged from [facility] and sent here. +Blood cultures. Patient has been at [facility] and Is now in copay days and that is the reason he left SNF. Patient states he can not afford to go to SNF and will return home at [place]. He states he can't walk. PT consulted. He also states his daughter Is working with the [facility] to get him an aid at home (approx. 15 hours a week). I discuss home health with him, and he is agreeable, he will possibly need IV antibiotics at home. Patient states prior to his most recent admissions he was independent. He now lives with daughter and son in [place]. He reports his daughter does not work and will be at home with him. He goes to [facility] for primary care.   Action/Plan: Anticipate DC home with home health. Left message for Rip Harbour (daughter) to call CM.   Expected Discharge Date:       [place]           Expected Discharge Plan:  [place]  In-House Referral:  Clinical Social Work  Discharge planning Services  CM Consult  Post Acute Care Choice:    Choice offered to:  Patient  DME Arranged:    DME Agency:     HH Arranged:    [place] Agency:     Status of Service:  In process, will continue to follow  If discussed at Long Length of Stay Meetings, dates discussed:    Additional Comments:  Desmon Hitchner, [name], RN 05/16/2017, 1:40 PM

## 2017-05-16 NOTE — Evaluation (Signed)
Physical Therapy Evaluation Patient Details Name: Andrew [name] MRN: [id number] DOB: 01/16/1941 Today's Date: 05/16/2017   History of Present Illness   76 y.o. male with history of COPD who lives with his daughter, per pt.  On 04/12/17 was found to be unresponsive, intubated and extubated 8/16.  Went to SNF for rehab, but now readmitted with resp failure from [facility].  Noted SOB and cardiomegaly.  PMHx:  colon CA, SBO, DM, COPD, 2L O2 use, CAD  Clinical Impression  Pt is able to stand with assistance and has instability with all movement, but is able to initiate hips from the bed however limited the effort.  He is appropriate for PT to continue on with his therapy and progress esp transfer skills to translate to home.    Follow Up Recommendations SNF    Equipment Recommendations  None recommended by PT (will defer to SNF but if home needs RW)    Recommendations for Other Services       Precautions / Restrictions Precautions Precautions: Fall Restrictions Weight Bearing Restrictions: No Other Position/Activity Restrictions: was in [place] mainly at SNF      Mobility  Bed Mobility Overal bed mobility: Needs Assistance Bed Mobility: Rolling;Supine to Sit;Sit to Supine Rolling: Mod assist   Supine to sit: Mod assist;HOB elevated Sit to supine: Mod assist   General bed mobility comments: assisted trunk mainly to sit up and legs back to bed  Transfers Overall transfer level: Needs assistance Equipment used: Rolling walker (2 wheeled);1 person hand held assist Transfers: Sit to/from Stand Sit to Stand: Mod assist         General transfer comment: pt has reduced standing tolerance, in flexed back posture and could only stand one minute with dense cues  Ambulation/Gait             General Gait Details: unable to tolerate  Stairs            Wheelchair Mobility    Modified Rankin (Stroke Patients Only)       Balance Overall balance assessment: Needs  assistance;History of Falls Sitting-balance support: Feet supported;Bilateral upper extremity supported Sitting balance-Leahy Scale: Fair     Standing balance support: Bilateral upper extremity supported;During functional activity Standing balance-Leahy Scale: Poor                               Pertinent Vitals/Pain Pain Assessment: No/denies pain    Home Living Family/patient expects to be discharged to:: Skilled nursing facility Living Arrangements:  (pt states he was with daughter but did not validate with SW ) Available Help at Discharge: Family;Available PRN/intermittently Type of Home: House         Home Equipment: None Additional Comments: no info available related to home environment    Prior Function Level of Independence: Independent         Comments: previously on rehab at SNF and used      Hand Dominance   Dominant Hand: Right    Extremity/Trunk Assessment   Upper Extremity Assessment Upper Extremity Assessment: Generalized weakness    Lower Extremity Assessment Lower Extremity Assessment: Generalized weakness RLE Deficits / Details: weakness in ankles of 3- DF and PF 2-    Cervical / Trunk Assessment Cervical / Trunk Assessment: Other exceptions Cervical / Trunk Exceptions: able to assist sitting bedside with extra time  Communication   Communication: No difficulties  Cognition Arousal/Alertness: Awake/alert Behavior During Therapy: Anxious Overall Cognitive  Status: Within Functional Limits for tasks assessed                                        General Comments      Exercises     Assessment/Plan    PT Assessment Patient needs continued PT services  PT Problem List Decreased strength;Decreased range of motion;Decreased activity tolerance;Decreased balance;Decreased mobility;Decreased coordination;Decreased knowledge of use of DME;Cardiopulmonary status limiting activity;Decreased skin integrity        PT Treatment Interventions DME instruction;Gait training;Stair training;Functional mobility training;Therapeutic activities;Therapeutic exercise;Balance training;Neuromuscular re-education;Patient/family education    PT Goals (Current goals can be found in the Care Plan section)  Acute Rehab PT Goals Patient Stated Goal: to get stronger PT Goal Formulation: With patient Time For Goal Achievement: 05/30/17 Potential to Achieve Goals: Fair    Frequency Min 2X/week   Barriers to discharge Decreased caregiver support;Inaccessible home environment has a step and a ramp if he is really going to go with daughter    Co-evaluation               AM-PAC PT "6 Clicks" Daily Activity  Outcome Measure Difficulty turning over in bed (including adjusting bedclothes, sheets and blankets)?: Unable Difficulty moving from lying on back to sitting on the side of the bed? : Unable Difficulty sitting down on and standing up from a chair with arms (e.g., wheelchair, bedside commode, etc,.)?: Unable Help needed moving to and from a bed to chair (including a wheelchair)?: A Lot Help needed walking in hospital room?: Total Help needed climbing 3-5 steps with a railing? : Total 6 Click Score: 7    End of Session Equipment Utilized During Treatment: Oxygen;Gait belt Activity Tolerance: Patient limited by fatigue Patient left: in bed;with call bell/phone within reach Nurse Communication: Mobility status PT Visit Diagnosis: Unsteadiness on feet (R26.81);Other abnormalities of gait and mobility (R26.89);Repeated falls (R29.6);Muscle weakness (generalized) (M62.81);Difficulty in walking, not elsewhere classified (R26.2)    Time: 1497-0263 PT Time Calculation (min) (ACUTE ONLY): 23 min   Charges:   PT Evaluation $PT Eval Low Complexity: 1 Low PT Treatments $Therapeutic Activity: 8-22 mins   PT G Codes:   PT G-Codes **NOT FOR INPATIENT CLASS** Functional Assessment Tool Used: AM-PAC 6 Clicks Basic  Mobility    Andrew [name] 05/16/2017, 1:52 PM   1:54 PM, 05/16/17 Andrew [name], PT, MS Physical Therapist - [place] [phone] (Office)

## 2017-05-17 DIAGNOSIS — J189 Pneumonia, unspecified organism: Principal | ICD-10-CM

## 2017-05-17 LAB — GLUCOSE, CAPILLARY
GLUCOSE-CAPILLARY: 212 mg/dL — AB (ref 65–99)
GLUCOSE-CAPILLARY: 269 mg/dL — AB (ref 65–99)
Glucose-Capillary: 283 mg/dL — ABNORMAL HIGH (ref 65–99)

## 2017-05-17 LAB — CULTURE, BLOOD (ROUTINE X 2)
SPECIAL REQUESTS: ADEQUATE
SPECIAL REQUESTS: ADEQUATE

## 2017-05-17 MED ORDER — PREDNISONE 10 MG PO TABS: ORAL_TABLET | ORAL | 0 refills | Status: DC

## 2017-05-17 MED ORDER — LEVOFLOXACIN 750 MG PO TABS: 750.0000 mg | ORAL_TABLET | Freq: Every day | ORAL | 0 refills | Status: DC

## 2017-05-17 MED ORDER — DOXYCYCLINE HYCLATE 100 MG PO TABS
100.0000 mg | ORAL_TABLET | Freq: Two times a day (BID) | ORAL | Status: DC
  Administered 2017-05-17: 100 mg via ORAL
  Filled 2017-05-17: qty 1

## 2017-05-17 MED ORDER — DOXYCYCLINE HYCLATE 100 MG PO TABS: 100.0000 mg | ORAL_TABLET | Freq: Two times a day (BID) | ORAL | 0 refills | Status: DC

## 2017-05-17 NOTE — Discharge Summary (Addendum)
Physician Discharge Summary  Andrew [name] RJJ:[id number] DOB: 09/05/1941 DOA: 05/13/2017  PCP: System, Provider Not In  Admit date: 05/13/2017 Discharge date: 05/17/2017  Recommendations for Outpatient Follow-up:  1. Pt will need to follow up with PCP in 1-2 weeks post discharge 2. Please obtain BMP to evaluate electrolytes and kidney function 3. Please note that patient was advised to stop smoking as he requires oxygen via nasal cannula, 2 L 4. Patient lives with daughter who takes care of him, daughter reports that patient is noncompliant with medical regimen and continues to smoke. I have discussed and the knowledge daughter's concern. I have also communicated this concern to patient and he verbalizes understanding. Daughter however doubts that her father will continue to be compliant. Daughter wanted father in skilled nursing facility however, medical insurance not able to cover for rehabilitation and therefore daughter will take father home.  Discharge Diagnoses:  Principal Problem:   Hospital acquired PNA Active Problems:   Diabetes mellitus ([place])   Essential hypertension   ATRIAL FIBRILLATION   CHRONIC OBSTRUCTIVE PULMONARY DISEASE, ACUTE EXACERBATION   LOW BACK PAIN, CHRONIC   AAA (abdominal aortic aneurysm) ([place])  Discharge Condition: Stable  Diet recommendation: Heart healthy diet discussed in details   History of present illness:  Patient is 76 year old male with known advanced COPD on oxygen via nasal cannula, continues to smoke, recently hospitalized last month and required intubation, was on life support for 1 week. At that time he was discharged to skilled nursing facility but has continued to decline with progressive generalized weakness. This time he presented with progressively worsening dyspnea, productive cough, wheezing. Patient was found to have pneumonia and was treated with broad-spectrum antibiotics. Please note that patient was also started on IV steroids. He  insisted on going home despite recommendation to remain hospitalized until better stability achieved.  Assessment & Plan:   1. Healthcare associated pneumonia - Started on vancomycin and cefepime, improved and antibiotic D escalated to Levaquin - We have changed antibiotic to doxycycline on discharge as patient wanted to be discharged today - Patient also discharged on supplemental oxygen, declined offer to provide prescription for nebulizers saying he has plenty of it at home  2. Positive blood culture - Patient had positive blood cultures for staph epidermidis, clinically he was afebrile and did not appear to have bacteremia - This was discussed with Dr. Baxter Flattery by Dr. Roderic Palau, she recommended de-escalation in antibiotics, repeating blood cultures and if negative consider staph epidermidis contaminant - Repeat blood cultures did come back negative  3. Acute on chronic respiratory failure. Related to pneumonia and COPD - Unable to taper off oxygen, home health orders placed - Compliance discussed in detail with patient and his daughter - Antibiotics as noted above, bronchodilators scheduled and as needed - Prednisone taper also provide  4. Atrial fibrillation, chronic - Currently stable, anticoagulated  5. Diabetes - Resume home medical regimen  6. Leukocytosis - Likely steroid-induced, patient refuses blood work this morning    DVT prophylaxis: eliquis Code Status: Full code Family Communication:  discussed with patient at bedside, also spoke with daughter over the phone. Daughter rather frustrated with the fact that patient cannot be discharged to skilled nursing facility and that insurance would not pay for it. She is also upset that father is not compliant and she has to take care of him and take huge responsibility for his health when he is not compliant with medications or oxygen use or smoking cessation. Disposition Plan: home  Antimicrobials:   Cefepime 9/9>>  9/11  Vancomycin 9/9>> 9/11  Levofloxacin 9/11>> changed to doxycycline on discharge   Procedures/Studies: Dg Chest 2 View  Result Date: 05/13/2017 CLINICAL DATA:  Shortness of breath EXAM: CHEST  2 VIEW COMPARISON:  04/21/2017, 04/10/2010 FINDINGS: Mild cardiomegaly. Increased bibasilar opacity since the prior study. No large pleural effusion. No pneumothorax. IMPRESSION: Increased bibasilar opacities suspicious for bibasilar pneumonia. Electronically Signed   By: Donavan Foil M.D.   On: 05/13/2017 23:42   Ct Head Wo Contrast  Result Date: 04/22/2017 CLINICAL DATA:  Weakness in both hands. EXAM: CT HEAD WITHOUT CONTRAST TECHNIQUE: Contiguous axial images were obtained from the base of the skull through the vertex without intravenous contrast. COMPARISON:  December 12, 2003 FINDINGS: Brain: No subdural, epidural, or subarachnoid hemorrhage. Cerebellum, brainstem, and basal cisterns are normal. Ventricles and sulci are unremarkable. A lacunar infarct is seen in the right corona radiata, similar since previous studies. A lacunar infarct is seen adjacent to the frontal horn of the left lateral ventricle, extending into the basal ganglia, new since 2005 but favored to be nonacute. White matter changes identified. No acute cortical ischemia or infarct. No mass effect or midline shift. Vascular: Calcified atherosclerosis is seen in the intracranial carotid arteries. Skull: Normal. Negative for fracture or focal lesion. Sinuses/Orbits: No acute finding. Other: None. IMPRESSION: 1. White matter changes and lacunar infarcts as above. The findings are mostly stable but a lacunar infarct in the left basal ganglia was not seen in 2005. However, the appearance of this lacunar infarcts suggest it is nonacute. No other acute abnormalities. Electronically Signed   By: Dorise Bullion III M.D   On: 04/22/2017 09:50   Dg Chest Port 1 View  Result Date: 04/21/2017 CLINICAL DATA:  Respiratory failure EXAM: PORTABLE CHEST  1 VIEW COMPARISON:  04/20/17 FINDINGS: Cardiac shadow is mildly enlarged. Right-sided PICC line is noted in the mid superior vena cava. The endotracheal tube and nasogastric catheter have been removed in the interval. Mild left basilar atelectasis is noted. Mild vascular congestion is seen. IMPRESSION: The slight increase in left basilar atelectasis. Mild vascular congestion. Electronically Signed   By: Inez Catalina M.D.   On: 04/21/2017 07:17   Dg Chest Port 1 View  Result Date: 04/20/2017 CLINICAL DATA:  Respiratory failure, intubation EXAM: PORTABLE CHEST 1 VIEW COMPARISON:  Portable chest x-ray of 04/19/2017 FINDINGS: The tip of the endotracheal tube is approximately 8.6 cm above the carina. Aeration of the lungs is relatively stable with minimal linear atelectasis at the left lung base. No focal pneumonia or effusion is seen. Right PICC line tip overlies the mid SVC. No pneumothorax is seen. Heart size is stable being mildly enlarged. IMPRESSION: 1. No change in aeration with mild linear atelectasis at the left lung base. 2. Tip of endotracheal tube approximately 8.6 cm above the carina. 3. Right PICC line tip overlies the mid SVC Electronically Signed   By: Ivar Drape M.D.   On: 04/20/2017 08:39   Dg Chest Port 1 View  Result Date: 04/19/2017 CLINICAL DATA:  Check for PICC line placement EXAM: PORTABLE CHEST 1 VIEW COMPARISON:  04/18/2017 FINDINGS: New right-sided PICC line is noted with catheter tip in the distal superior vena cava. Endotracheal tube and nasogastric catheter are noted in satisfactory position. Cardiac shadow is stable. The lungs show some improved aeration in the bases. No new focal abnormality is seen. IMPRESSION: Status post PICC line placement in satisfactory position. Improved aeration in the bases. Electronically  Signed   By: Inez Catalina M.D.   On: 04/19/2017 21:20   Dg Chest Port 1 View  Result Date: 04/18/2017 CLINICAL DATA:  Endotracheal tube placement. Respiratory  failure. Unresponsive on admission. EXAM: PORTABLE CHEST 1 VIEW COMPARISON:  Earlier the same date and 04/17/2017. FINDINGS: 1645 hour. The tip of the endotracheal tube is in the mid trachea. Nasogastric tube projects below the diaphragm. A line loops over the lower left chest, probably separate from the nasogastric tube, although incompletely visualized. The heart size and mediastinal contours are stable. There are stable bibasilar pulmonary opacities, most consistent with atelectasis. No pneumothorax or significant pleural effusion. IMPRESSION: No significant changes are seen. Patchy bibasilar opacities are stable, likely atelectasis. Endotracheal tube appears satisfactorily positioned. Line projecting over the heart shadow is probably separate from the adjacent nasogastric tube, although incompletely visualized. Electronically Signed   By: Richardean Sale M.D.   On: 04/18/2017 17:15   Dg Chest Port 1 View  Result Date: 04/18/2017 CLINICAL DATA:  Acute respiratory failure with hypoxemia. EXAM: PORTABLE CHEST 1 VIEW COMPARISON:  Radiograph of April 17, 2017. FINDINGS: Stable cardiomediastinal silhouette. Endotracheal and nasogastric tubes are unchanged in position. No pneumothorax or significant pleural effusion is noted. Stable mild central pulmonary vascular congestion is noted. Bony thorax is unremarkable. Mild bibasilar subsegmental atelectasis is noted. IMPRESSION: Stable support apparatus. Stable mild central pulmonary vascular congestion. Mild bibasilar subsegmental atelectasis. Electronically Signed   By: Marijo Conception, M.D.   On: 04/18/2017 07:34     Discharge Exam: Vitals:   05/17/17 0751 05/17/17 1052  BP:    Pulse:    Resp:    Temp:    SpO2: 93% 96%   Vitals:   05/17/17 0151 05/17/17 0625 05/17/17 0751 05/17/17 1052  BP:  118/66    Pulse:  78    Resp:  20    Temp:  98.2 F (36.8 C)    TempSrc:  Oral    SpO2: 96% 98% 93% 96%  Weight:      Height:        General: Pt is  alert, follows commands appropriately, not in acute distress Cardiovascular: Regular rate and rhythm, S1/S2 +, no murmurs, no rubs, no gallops Respiratory: Bilateral wheezing, rhonchi at bases  Discharge Instructions  Discharge Instructions    Diet - low sodium heart healthy    Complete by:  As directed    Increase activity slowly    Complete by:  As directed       Allergies as of 05/17/2017      Reactions   Procaine Other (See Comments), Shortness Of Breath   Syncope   Procaine Hcl Anaphylaxis, Swelling   Atorvastatin Palpitations   REACTION: Heartburn      Medication List    TAKE these medications   acetaminophen 650 MG CR tablet Commonly known as:  TYLENOL Take 650 mg by mouth every 8 (eight) hours as needed for pain.   albuterol (2.5 MG/3ML) 0.083% nebulizer solution Commonly known as:  PROVENTIL Take 2.5 mg by nebulization every 4 (four) hours as needed for wheezing or shortness of breath.   albuterol 108 (90 Base) MCG/ACT inhaler Commonly known as:  PROVENTIL HFA;VENTOLIN HFA Inhale 2 puffs into the lungs every 4 (four) hours as needed for wheezing.   apixaban 2.5 MG Tabs tablet Commonly known as:  ELIQUIS Take 1 tablet (2.5 mg total) by mouth 2 (two) times daily.   diltiazem 300 MG 24 hr capsule Commonly known as:  TIAZAC Take 300 mg by mouth daily.   doxycycline 100 MG tablet Commonly known as:  VIBRA-TABS Take 1 tablet (100 mg total) by mouth every 12 (twelve) hours.   glipiZIDE 5 MG tablet Commonly known as:  GLUCOTROL Take 5 mg by mouth daily before breakfast.   ipratropium-albuterol 0.5-2.5 (3) MG/3ML Soln Commonly known as:  DUONEB Take 3 mLs by nebulization QID.   Melatonin 3 MG Tabs Take 1 tablet by mouth at bedtime.   metFORMIN 1000 MG (MOD) 24 hr tablet Commonly known as:  GLUMETZA Take 1,000 mg by mouth daily with breakfast.   nicotine 14 mg/24hr patch Commonly known as:  NICODERM CQ - dosed in mg/24 hours Place 1 patch (14 mg total)  onto the skin daily.   omeprazole 40 MG capsule Commonly known as:  PRILOSEC Take 40 mg by mouth daily.   predniSONE 10 MG tablet Commonly known as:  DELTASONE Take 50 mg tablet 05/18/2017 and taper down by 10 mg daily until completed   rosuvastatin 20 MG tablet Commonly known as:  CRESTOR Take 1 tablet (20 mg total) by mouth daily.   saxagliptin HCl 5 MG Tabs tablet Commonly known as:  ONGLYZA Take 5 mg by mouth daily.   senna-docusate 8.6-50 MG tablet Commonly known as:  Senokot-S Take 1 tablet by mouth at bedtime as needed for mild constipation.   SINGULAIR 10 MG tablet Generic drug:  montelukast Take 10 mg by mouth daily.   Vitamin B-12 2500 MCG Subl Place 2,500 mcg under the tongue daily.            Discharge Care Instructions        Start     Ordered   05/17/17 0000  predniSONE (DELTASONE) 10 MG tablet     05/17/17 1115   05/17/17 0000  Increase activity slowly     05/17/17 1115   05/17/17 0000  Diet - low sodium heart healthy     05/17/17 1115   05/17/17 0000  doxycycline (VIBRA-TABS) 100 MG tablet  Every 12 hours     05/17/17 1120   05/17/17 0000  Increase activity slowly     05/17/17 1309   05/17/17 0000  Diet - low sodium heart healthy     05/17/17 1309      Contact information for after-discharge care    [facility] Follow up.   Specialty:  [place] information: [address] [place] [phone]               The results of significant diagnostics from this hospitalization (including imaging, microbiology, ancillary and laboratory) are listed below for reference.     Microbiology: Recent Results (from the past 240 hour(s))  Blood culture (routine x 2)     Status: Abnormal   Collection Time: 05/14/17 12:18 AM  Result Value Ref Range Status   Specimen Description LEFT ANTECUBITAL  Final   Special Requests Blood Culture adequate volume  Final    Culture  Setup Time   Final    GRAM POSITIVE COCCI Gram Stain Report Called to,Read Back By and Verified With: HARRIS,B. AT 0015 ON 05/15/2017 BY EVA ANAEROBIC BOTTLE ONLY Performed at [facility] WITH RESULT 0513 05/15/2017 T. TYSOR CRITICAL RESULT CALLED TO, READ BACK BY AND VERIFIED WITH: B. HARRIS,RN [id number] 05/15/2017 T. TYSOR    Culture (A)  Final    STAPHYLOCOCCUS EPIDERMIDIS SUSCEPTIBILITIES PERFORMED ON PREVIOUS  CULTURE WITHIN THE LAST 5 DAYS. Performed at [facility], [place] [address] [place], [place] [address]    Report Status 05/17/2017 FINAL  Final  Blood Culture ID Panel (Reflexed)     Status: Abnormal   Collection Time: 05/14/17 12:18 AM  Result Value Ref Range Status   Enterococcus species NOT DETECTED NOT DETECTED Final   Vancomycin resistance NOT DETECTED NOT DETECTED Final   Listeria monocytogenes NOT DETECTED NOT DETECTED Final   Staphylococcus species DETECTED (A) NOT DETECTED Final    Comment: CRITICAL RESULT CALLED TO, READ BACK BY AND VERIFIED WITH: B. HARRIS,RN [id number] 05/15/2017 T. TYSOR    Staphylococcus aureus NOT DETECTED NOT DETECTED Final   Methicillin resistance DETECTED (A) NOT DETECTED Final    Comment: CRITICAL RESULT CALLED TO, READ BACK BY AND VERIFIED WITH: B. HARRIS,RN [id number] 05/15/2017 T. TYSOR    Streptococcus species NOT DETECTED NOT DETECTED Final   Streptococcus agalactiae NOT DETECTED NOT DETECTED Final   Streptococcus pneumoniae NOT DETECTED NOT DETECTED Final   Streptococcus pyogenes NOT DETECTED NOT DETECTED Final   Acinetobacter baumannii NOT DETECTED NOT DETECTED Final   Enterobacteriaceae species NOT DETECTED NOT DETECTED Final   Enterobacter cloacae complex NOT DETECTED NOT DETECTED Final   Escherichia coli NOT DETECTED NOT DETECTED Final   Klebsiella oxytoca NOT DETECTED NOT DETECTED Final   Klebsiella pneumoniae NOT DETECTED NOT DETECTED Final   Proteus species NOT DETECTED NOT DETECTED Final    Serratia marcescens NOT DETECTED NOT DETECTED Final   Carbapenem resistance NOT DETECTED NOT DETECTED Final   Haemophilus influenzae NOT DETECTED NOT DETECTED Final   Neisseria meningitidis NOT DETECTED NOT DETECTED Final   Pseudomonas aeruginosa NOT DETECTED NOT DETECTED Final   Candida albicans NOT DETECTED NOT DETECTED Final   Candida glabrata NOT DETECTED NOT DETECTED Final   Candida krusei NOT DETECTED NOT DETECTED Final   Candida parapsilosis NOT DETECTED NOT DETECTED Final   Candida tropicalis NOT DETECTED NOT DETECTED Final  Blood culture (routine x 2)     Status: Abnormal   Collection Time: 05/14/17 12:20 AM  Result Value Ref Range Status   Specimen Description BLOOD LEFT HAND  Final   Special Requests Blood Culture adequate volume  Final   Culture  Setup Time   Final    GRAM POSITIVE COCCI IN CLUSTERS AEROBIC BOTTLE ONLY Gram Stain Report Called to,Read Back By and Verified With: HOBBS,A AT 1157 BY HUFFINES,S ON 05/15/17.    Culture STAPHYLOCOCCUS EPIDERMIDIS (A)  Final   Report Status 05/17/2017 FINAL  Final   Organism ID, Bacteria STAPHYLOCOCCUS EPIDERMIDIS  Final      Susceptibility   Staphylococcus epidermidis - MIC*    CIPROFLOXACIN >=8 RESISTANT Resistant     ERYTHROMYCIN >=8 RESISTANT Resistant     GENTAMICIN >=16 RESISTANT Resistant     OXACILLIN >=4 RESISTANT Resistant     TETRACYCLINE 2 SENSITIVE Sensitive     VANCOMYCIN 1 SENSITIVE Sensitive     TRIMETH/SULFA 160 RESISTANT Resistant     CLINDAMYCIN >=8 RESISTANT Resistant     RIFAMPIN <=0.5 SENSITIVE Sensitive     Inducible Clindamycin NEGATIVE Sensitive     * STAPHYLOCOCCUS EPIDERMIDIS  MRSA PCR Screening     Status: None   Collection Time: 05/14/17  3:44 AM  Result Value Ref Range Status   MRSA by PCR NEGATIVE NEGATIVE Final    Comment:        The GeneXpert MRSA Assay (FDA approved for NASAL specimens only), is one  component of a comprehensive MRSA colonization surveillance program. It is  not intended to diagnose MRSA infection nor to guide or monitor treatment for MRSA infections.   Culture, blood (routine x 2)     Status: None (Preliminary result)   Collection Time: 05/16/17  3:25 AM  Result Value Ref Range Status   Specimen Description BLOOD RIGHT ARM  Final   Special Requests   Final    BOTTLES DRAWN AEROBIC AND ANAEROBIC Blood Culture adequate volume   Culture NO GROWTH < 12 HOURS  Final   Report Status PENDING  Incomplete  Culture, blood (routine x 2)     Status: None (Preliminary result)   Collection Time: 05/16/17  3:34 AM  Result Value Ref Range Status   Specimen Description BLOOD RIGHT ARM  Final   Special Requests   Final    BOTTLES DRAWN AEROBIC AND ANAEROBIC Blood Culture adequate volume   Culture NO GROWTH < 12 HOURS  Final   Report Status PENDING  Incomplete     Labs: Basic Metabolic Panel:  Recent Labs Lab 05/14/17 0018 05/16/17 0334  NA 135 133*  K 4.2 4.4  CL 98* 96*  CO2 29 27  GLUCOSE 142* 293*  BUN 8 24*  CREATININE 0.65 0.73  CALCIUM 9.0 8.7*   CBC:  Recent Labs Lab 05/14/17 0018 05/16/17 0334  WBC 10.0 13.0*  NEUTROABS 6.8  --   HGB 11.7* 11.2*  HCT 35.6* 33.7*  MCV 80.7 79.7  PLT 268 291   Cardiac Enzymes:  Recent Labs Lab 05/14/17 0018  TROPONINI <0.03   BNP (last 3 results)  Recent Labs  04/12/17 1200 05/14/17 0018  BNP 317.0* 55.0   CBG:  Recent Labs Lab 05/16/17 0743 05/16/17 1120 05/16/17 1611 05/16/17 2108 05/17/17 0742  GLUCAP 294* 321* 335* 250* 212*    SIGNED: Time coordinating discharge: 65 minutes  Faye Ramsay, MD  [facility] 05/17/2017, 11:16 AM Pager [phone]  If 7PM-7AM, please contact night-coverage [email or web address] Password [email or web address]

## 2017-05-17 NOTE — Care Management (Addendum)
Patient discharging home today. CM discussed Home health with patient and daughter. Will order home health with [facility]. Daughter states she is in contact with [facility] In regards to aide services, hospital bed and [facility]. CM placed call to patient's social worker at [employer]. She will try to expedite delivery of bed and BSC. DME has already been [facility] Romero Liner.  She also plans to ask for extended hours of aide services and will let community nurse know that patient is discharging home today. Patient already has oxygen at home.

## 2017-05-17 NOTE — Discharge Instructions (Signed)

## 2017-05-17 NOTE — Progress Notes (Signed)
Physical Therapy Treatment Patient Details Name: Andrew [name] MRN: [id number] DOB: Jan 27, 1941 Today's Date: 05/17/2017    History of Present Illness  76 y.o. male with history of COPD who lives with his daughter, per pt.  On 04/12/17 was found to be unresponsive, intubated and extubated 8/16.  Went to SNF for rehab, but now readmitted with resp failure from [facility].  Noted SOB and cardiomegaly.  PMHx:  colon CA, SBO, DM, COPD, 2L O2 use, CAD    PT Comments    Pt is going home at the end of hosp stay and will plan to have him up and moving as much as possible by then.  He is usually at wc level due to reduced lung capacity and so will continue on with HHPT but is still at SNF level due to reduction in his standing endurance.  Follow acutely for strength, standing endurance and control of standing balance.    Follow Up Recommendations  SNF     Equipment Recommendations  None recommended by PT    Recommendations for Other Services       Precautions / Restrictions Precautions Precautions: Fall Precaution Comments: Due to increased weakness Restrictions Weight Bearing Restrictions: No Other Position/Activity Restrictions: was in WC mainly at SNF    Mobility  Bed Mobility Overal bed mobility: Needs Assistance Bed Mobility: Rolling;Supine to Sit Rolling: Min guard   Supine to sit: Min guard;Min assist     General bed mobility comments: cued posture  Transfers Overall transfer level: Needs assistance Equipment used: Rolling walker (2 wheeled);1 person hand held assist Transfers: Sit to/from Stand Sit to Stand: Min assist         General transfer comment: improving his control of standing and needing help mainly to power up, then min guard to transition to chair  Ambulation/Gait                 Stairs            Wheelchair Mobility    Modified Rankin (Stroke Patients Only)       Balance     Sitting balance-Leahy Scale: Fair       Standing  balance-Leahy Scale: Fair                              Cognition Arousal/Alertness: Awake/alert Behavior During Therapy: Anxious Overall Cognitive Status: Within Functional Limits for tasks assessed                                        Exercises General Exercises - Lower Extremity Short Arc Quad: Strengthening;Both;10 reps Heel Slides: Strengthening;Both;10 reps Hip ABduction/ADduction: Strengthening;Both;10 reps    General Comments        Pertinent Vitals/Pain Pain Assessment: No/denies pain    Home Living                      Prior Function            PT Goals (current goals can now be found in the care plan section) Acute Rehab PT Goals Patient Stated Goal: to get stronger Progress towards PT goals: Progressing toward goals    Frequency    Min 2X/week      PT Plan Current plan remains appropriate    Co-evaluation  AM-PAC PT "6 Clicks" Daily Activity  Outcome Measure  Difficulty turning over in bed (including adjusting bedclothes, sheets and blankets)?: Unable Difficulty moving from lying on back to sitting on the side of the bed? : Unable Difficulty sitting down on and standing up from a chair with arms (e.g., wheelchair, bedside commode, etc,.)?: Unable Help needed moving to and from a bed to chair (including a wheelchair)?: A Little Help needed walking in hospital room?: A Lot Help needed climbing 3-5 steps with a railing? : Total 6 Click Score: 9    End of Session Equipment Utilized During Treatment: Oxygen;Gait belt Activity Tolerance: Patient limited by fatigue Patient left: in chair;with call bell/phone within reach;with nursing/sitter in room Nurse Communication: Mobility status PT Visit Diagnosis: Unsteadiness on feet (R26.81);Other abnormalities of gait and mobility (R26.89);Repeated falls (R29.6);Muscle weakness (generalized) (M62.81);Difficulty in walking, not elsewhere classified  (R26.2)     Time: 0211-1735 PT Time Calculation (min) (ACUTE ONLY): 41 min  Charges:  $Therapeutic Exercise: 8-22 mins $Therapeutic Activity: 23-37 mins                    G Codes:  Functional Assessment Tool Used: AM-PAC 6 Clicks Basic Mobility     Ramond Dial 05/17/2017, 1:21 PM   1:23 PM, 05/17/17 Mee Hives, PT, MS Physical Therapist - [place] [phone] (Office)

## 2017-05-17 NOTE — Progress Notes (Signed)
Inpatient Diabetes Program Recommendations  AACE/ADA: New Consensus Statement on Inpatient Glycemic Control (2015)  Target Ranges:  Prepandial:   less than 140 mg/dL      Peak postprandial:   less than 180 mg/dL (1-2 hours)      Critically ill patients:  140 - 180 mg/dL   Results for JAMEIRE, KOUBA (MRN [id number]) as of 05/17/2017 10:11  Ref. Range 05/16/2017 07:43 05/16/2017 11:20 05/16/2017 16:11 05/16/2017 21:08 05/17/2017 07:42  Glucose-Capillary Latest Ref Range: 65 - 99 mg/dL 294 (H) 321 (H) 335 (H) 250 (H) 212 (H)    Review of Glycemic Control  Outpatient Diabetes medications: Glipizide 5 mg QAM, Glumetza 1000 mg QAM, Onglyza 5 mg daily Current orders for Inpatient glycemic control: Novolog 0-9 units TID with meals  Inpatient Diabetes Program Recommendations: Correction (SSI): Please consider ordering Novolog 0-5 units QHS for bedtime correction scale. Insulin - Meal Coverage: While inpatient and ordered steroids, please consider ordering Novolog 4 units TID with meals for meal coverage if patient eats at least 50% of meals.  NOTE: Noted steroids changed from IV to Prednisone 40 mg QAM today.  Thanks, Barnie Alderman, RN, MSN, CDE Diabetes Coordinator Inpatient Diabetes Program [phone] (Team Pager from 8am to 5pm)

## 2017-05-17 NOTE — Progress Notes (Signed)
Pt refused am lab draw

## 2017-05-17 NOTE — Clinical Social Work Note (Signed)
Patient admitted from [facility]. He will return home at discharge. Patient was at the facility for 20 days and is now in copay days and is unable to pay the daily copay days for continued placement. Patient will be discharged with [facility] services.   LCSW signing off.

## 2017-05-17 NOTE — Progress Notes (Signed)
Patient's O2 supply has arrived for patient to transport home, still awaiting arrival of patient's daughter at this time to transport patient home.

## 2017-05-17 NOTE — Progress Notes (Signed)
Discharge instructions gone over with patient, verbalized understanding. IV removed, patient tolerated procedure well. 

## 2017-05-17 NOTE — Care Management (Addendum)
CM contacted [employer], they will deliver port tank for patient to room for transport home. Patient and nurse updated.

## 2017-05-18 NOTE — Progress Notes (Signed)
Late entry from 2100: Pts daughter arrived to pick pt up to go home. Daughter asked if someone called Dr. Rosealee Albee and made him aware of prescriptions that were ordered so he could put a consult in at the [place]. Pt stated that he told the day nurse, Kayla to please make that phone call several times. RN looked and saw no note about Dr. Rosealee Albee being called. A copy of the prescriptions was made and daughter and pt adv that RN would Network engineer, Camera operator and day nurse from earlier to call Dr.Dyers office today. Daughter given phone number to floor. Will pass on

## 2017-05-18 NOTE — Progress Notes (Signed)
Fax was sent with attention to Dr. Rosealee Albee, the patient's primary care doctor containing patient's prescriptions. I have requested Dr. Pia Mau office to call me to ensure that they have received the fax. I am currently awaiting a return phone call.

## 2017-05-21 LAB — CULTURE, BLOOD (ROUTINE X 2)
CULTURE: NO GROWTH
CULTURE: NO GROWTH
SPECIAL REQUESTS: ADEQUATE
Special Requests: ADEQUATE

## 2017-06-22 ENCOUNTER — Observation Stay (HOSPITAL_COMMUNITY)
Admission: EM | Admit: 2017-06-22 | Discharge: 2017-06-25 | Disposition: A | Discharge status: Home / Self Care | Payer: Medicare PPO | Attending: Internal Medicine | Admitting: Internal Medicine

## 2017-06-22 ENCOUNTER — Emergency Department (HOSPITAL_COMMUNITY): Payer: Medicare PPO

## 2017-06-22 ENCOUNTER — Encounter (HOSPITAL_COMMUNITY): Payer: Self-pay | Admitting: Emergency Medicine

## 2017-06-22 DIAGNOSIS — I10 Essential (primary) hypertension: Secondary | ICD-10-CM | POA: Insufficient documentation

## 2017-06-22 DIAGNOSIS — Z79899 Other long term (current) drug therapy: Secondary | ICD-10-CM | POA: Insufficient documentation

## 2017-06-22 DIAGNOSIS — R05 Cough: Secondary | ICD-10-CM | POA: Insufficient documentation

## 2017-06-22 DIAGNOSIS — F0390 Unspecified dementia without behavioral disturbance: Secondary | ICD-10-CM | POA: Insufficient documentation

## 2017-06-22 DIAGNOSIS — F17210 Nicotine dependence, cigarettes, uncomplicated: Secondary | ICD-10-CM | POA: Insufficient documentation

## 2017-06-22 DIAGNOSIS — I2510 Atherosclerotic heart disease of native coronary artery without angina pectoris: Secondary | ICD-10-CM | POA: Insufficient documentation

## 2017-06-22 DIAGNOSIS — Z7984 Long term (current) use of oral hypoglycemic drugs: Secondary | ICD-10-CM | POA: Diagnosis not present

## 2017-06-22 DIAGNOSIS — Z85828 Personal history of other malignant neoplasm of skin: Secondary | ICD-10-CM | POA: Diagnosis not present

## 2017-06-22 DIAGNOSIS — J441 Chronic obstructive pulmonary disease with (acute) exacerbation: Secondary | ICD-10-CM | POA: Diagnosis not present

## 2017-06-22 DIAGNOSIS — E119 Type 2 diabetes mellitus without complications: Secondary | ICD-10-CM | POA: Diagnosis not present

## 2017-06-22 DIAGNOSIS — Z7901 Long term (current) use of anticoagulants: Secondary | ICD-10-CM | POA: Diagnosis not present

## 2017-06-22 DIAGNOSIS — I4891 Unspecified atrial fibrillation: Secondary | ICD-10-CM | POA: Insufficient documentation

## 2017-06-22 DIAGNOSIS — E1165 Type 2 diabetes mellitus with hyperglycemia: Secondary | ICD-10-CM

## 2017-06-22 DIAGNOSIS — R0602 Shortness of breath: Secondary | ICD-10-CM | POA: Diagnosis present

## 2017-06-22 LAB — CBC WITH DIFFERENTIAL/PLATELET
Basophils Absolute: 0 10*3/uL (ref 0.0–0.1)
Basophils Relative: 0 %
EOS PCT: 1 %
Eosinophils Absolute: 0.1 10*3/uL (ref 0.0–0.7)
HCT: 39.6 % (ref 39.0–52.0)
Hemoglobin: 12.1 g/dL — ABNORMAL LOW (ref 13.0–17.0)
Lymphocytes Relative: 23 %
Lymphs Abs: 1.9 10*3/uL (ref 0.7–4.0)
MCH: 26.6 pg (ref 26.0–34.0)
MCHC: 30.6 g/dL (ref 30.0–36.0)
MCV: 87 fL (ref 78.0–100.0)
MONOS PCT: 9 %
Monocytes Absolute: 0.7 10*3/uL (ref 0.1–1.0)
NEUTROS PCT: 67 %
Neutro Abs: 5.7 10*3/uL (ref 1.7–7.7)
PLATELETS: 178 10*3/uL (ref 150–400)
RBC: 4.55 MIL/uL (ref 4.22–5.81)
RDW: 19.8 % — ABNORMAL HIGH (ref 11.5–15.5)
WBC: 8.4 10*3/uL (ref 4.0–10.5)

## 2017-06-22 LAB — BLOOD GAS, VENOUS
Acid-Base Excess: 14.9 mmol/L — ABNORMAL HIGH (ref 0.0–2.0)
Bicarbonate: 36.7 mmol/L — ABNORMAL HIGH (ref 20.0–28.0)
DRAWN BY: 1528
FIO2: 36
O2 SAT: 94.8 %
PATIENT TEMPERATURE: 37
PCO2 VEN: 69.4 mmHg — AB (ref 44.0–60.0)
pH, Ven: 7.385 (ref 7.250–7.430)
pO2, Ven: 77.9 mmHg — ABNORMAL HIGH (ref 32.0–45.0)

## 2017-06-22 LAB — COMPREHENSIVE METABOLIC PANEL
ALK PHOS: 68 U/L (ref 38–126)
ALT: 20 U/L (ref 17–63)
AST: 14 U/L — ABNORMAL LOW (ref 15–41)
Albumin: 2.9 g/dL — ABNORMAL LOW (ref 3.5–5.0)
Anion gap: 10 (ref 5–15)
BILIRUBIN TOTAL: 0.4 mg/dL (ref 0.3–1.2)
BUN: 10 mg/dL (ref 6–20)
CALCIUM: 8.4 mg/dL — AB (ref 8.9–10.3)
CHLORIDE: 92 mmol/L — AB (ref 101–111)
CO2: 39 mmol/L — ABNORMAL HIGH (ref 22–32)
CREATININE: 0.77 mg/dL (ref 0.61–1.24)
Glucose, Bld: 316 mg/dL — ABNORMAL HIGH (ref 65–99)
Potassium: 3.3 mmol/L — ABNORMAL LOW (ref 3.5–5.1)
Sodium: 141 mmol/L (ref 135–145)
TOTAL PROTEIN: 5.9 g/dL — AB (ref 6.5–8.1)

## 2017-06-22 LAB — TROPONIN I

## 2017-06-22 LAB — BRAIN NATRIURETIC PEPTIDE: B NATRIURETIC PEPTIDE 5: 187 pg/mL — AB (ref 0.0–100.0)

## 2017-06-22 MED ORDER — IPRATROPIUM BROMIDE 0.02 % IN SOLN
1.0000 mg | Freq: Once | RESPIRATORY_TRACT | Status: AC
  Administered 2017-06-22: 1 mg via RESPIRATORY_TRACT
  Filled 2017-06-22: qty 5

## 2017-06-22 MED ORDER — ALBUTEROL (5 MG/ML) CONTINUOUS INHALATION SOLN
10.0000 mg/h | INHALATION_SOLUTION | RESPIRATORY_TRACT | Status: DC
  Administered 2017-06-22: 10 mg/h via RESPIRATORY_TRACT
  Filled 2017-06-22: qty 20

## 2017-06-22 MED ORDER — FUROSEMIDE 10 MG/ML IJ SOLN
40.0000 mg | Freq: Once | INTRAMUSCULAR | Status: AC
Start: 2017-06-22 — End: 2017-06-22
  Administered 2017-06-22: 40 mg via INTRAVENOUS
  Filled 2017-06-22: qty 4

## 2017-06-22 MED ORDER — MAGNESIUM SULFATE 2 GM/50ML IV SOLN
2.0000 g | Freq: Once | INTRAVENOUS | Status: AC
  Administered 2017-06-22: 2 g via INTRAVENOUS
  Filled 2017-06-22: qty 50

## 2017-06-22 MED ORDER — AZITHROMYCIN 500 MG IV SOLR
500.0000 mg | Freq: Once | INTRAVENOUS | Status: AC
  Administered 2017-06-22: 500 mg via INTRAVENOUS
  Filled 2017-06-22: qty 500

## 2017-06-22 NOTE — ED Triage Notes (Signed)
Pt called ems for sob pt on [facility] on arrival pt was given solumederol 125mg  and breathing treating in route 20 gauge in right hand by ems

## 2017-06-22 NOTE — ED Notes (Signed)
Gave EKG too Dr.Mesner

## 2017-06-22 NOTE — ED Provider Notes (Signed)
[facility] EMERGENCY DEPARTMENT Provider Note   CSN: [id number] Arrival date & time: 06/22/17  1954     History   Chief Complaint Chief Complaint  Patient presents with  . Shortness of Breath    HPI Andrew [name] is a 76 y.o. male.   Shortness of Breath  This is a recurrent problem. The problem occurs continuously.The problem has been gradually worsening. Associated symptoms include cough and sputum production. Pertinent negatives include no fever.    Past Medical History:  Diagnosis Date  . A-fib ([place])   . Abdominal pain   . Adenomatous colon polyp   . Arthritis   . Asthma   . CAD (coronary artery disease)   . Cancer ([place])    skin, nose  . Cataract    bilateral  . Chronic LBP   . COPD (chronic obstructive pulmonary disease) ([place])   . Decreased libido   . Diverticulosis of colon   . Esophageal stricture   . Fatigue   . GERD (gastroesophageal reflux disease)   . History of small bowel obstruction   . Hyperlipidemia   . Hypertension   . Hypertrophy of prostate with urinary obstruction and other lower urinary tract symptoms (LUTS)   . Palpitations   . Peri-rectal abscess   . Personal history of renal calculi   . Prostatitis, acute   . Tobacco abuse     Patient Active Problem List   Diagnosis Date Noted  . Hospital acquired PNA 05/14/2017  . COPD exacerbation ([place])   . HCAP (healthcare-associated pneumonia)   . Acute respiratory failure with hypoxia and hypercapnia ([place]) 04/12/2017  . Pneumonia 10/24/2016  . AAA (abdominal aortic aneurysm) ([place]) 01/04/2011  . Dizziness, nonspecific 12/26/2010  . UNSPECIFIED PERIPHERAL VASCULAR DISEASE 09/23/2010  . LEG CRAMPS 09/13/2010  . Diabetes mellitus ([place]) 02/11/2010  . CHRONIC OBSTRUCTIVE PULMONARY DISEASE, ACUTE EXACERBATION 02/11/2010  . OLECRANON BURSITIS, RIGHT 01/26/2010  . PENILE PAIN 12/29/2009  . ABNORMAL EJACULATION 05/05/2009  . LARYNGITIS, ACUTE 04/21/2009  . ATRIAL FIBRILLATION 04/08/2009  .  PALPITATIONS 04/08/2009  . FATIGUE 03/31/2009  . LIBIDO, DECREASED 03/31/2009  . HYPERTROPHY PROSTATE W/UR OBST & OTH LUTS 03/25/2009  . ACUTE PROSTATITIS 03/25/2009  . SMALL BOWEL OBSTRUCTION, HX OF 11/28/2007  . Hyperlipidemia 11/13/2007  . COPD 10/02/2007  . LOW BACK PAIN, CHRONIC 10/02/2007  . ADENOMATOUS COLONIC POLYP 05/22/2007  . ESOPHAGEAL STRICTURE 05/22/2007  . DIVERTICULOSIS, COLON 05/22/2007  . TOBACCO ABUSE 05/08/2007  . Essential hypertension 05/08/2007  . CORONARY ARTERY DISEASE 05/08/2007  . ASTHMA 05/08/2007  . GERD 05/08/2007  . RENAL CALCULUS, HX OF 05/08/2007  . ABSCESS, PERIRECTAL, HX OF 05/08/2007  . ARTHRITIS, HX OF 05/08/2007  . PENILE PROSTHESIS 05/08/2007  . LAMINECTOMY, LUMBAR, HX OF 05/08/2007    Past Surgical History:  Procedure Laterality Date  . CATARACT EXTRACTION  04/2010   bilateral  . HERNIA REPAIR    . LUMBAR LAMINECTOMY     with Harrington rods  . PENILE PROSTHESIS PLACEMENT    . TONSILLECTOMY         Home Medications    Prior to Admission medications   Medication Sig Start Date End Date Taking? Authorizing Provider  acetaminophen (TYLENOL) 650 MG CR tablet Take 650 mg by mouth every 8 (eight) hours as needed for pain.    [provider]  albuterol (PROVENTIL HFA;VENTOLIN HFA) 108 (90 BASE) MCG/ACT inhaler Inhale 2 puffs into the lungs every 4 (four) hours as needed for wheezing.  [provider]  albuterol (PROVENTIL) (2.5 MG/3ML) 0.083% nebulizer solution Take 2.5 mg by nebulization every 4 (four) hours as needed for wheezing or shortness of breath.    [provider]  apixaban (ELIQUIS) 2.5 MG TABS tablet Take 1 tablet (2.5 mg total) by mouth 2 (two) times daily. 04/24/17   Waldron Session, MD  Cyanocobalamin (VITAMIN B-12) 2500 MCG SUBL Place 2,500 mcg under the tongue daily.    [provider]  diltiazem (TIAZAC) 300 MG 24 hr capsule Take 300 mg by mouth daily.     [provider]    doxycycline (VIBRA-TABS) 100 MG tablet Take 1 tablet (100 mg total) by mouth every 12 (twelve) hours. 05/17/17   Theodis Blaze, MD  glipiZIDE (GLUCOTROL) 5 MG tablet Take 5 mg by mouth daily before breakfast.    [provider]  ipratropium-albuterol (DUONEB) 0.5-2.5 (3) MG/3ML SOLN Take 3 mLs by nebulization QID.     [provider]  Melatonin 3 MG TABS Take 1 tablet by mouth at bedtime.    [provider]  metFORMIN (GLUMETZA) 1000 MG (MOD) 24 hr tablet Take 1,000 mg by mouth daily with breakfast.    [provider]  montelukast (SINGULAIR) 10 MG tablet Take 10 mg by mouth daily.    [provider]  nicotine (NICODERM CQ - DOSED IN MG/24 HOURS) 14 mg/24hr patch Place 1 patch (14 mg total) onto the skin daily. 04/25/17   Waldron Session, MD  omeprazole (PRILOSEC) 40 MG capsule Take 40 mg by mouth daily.    [provider]  predniSONE (DELTASONE) 10 MG tablet Take 50 mg tablet 05/18/2017 and taper down by 10 mg daily until completed 05/17/17   Theodis Blaze, MD  rosuvastatin (CRESTOR) 20 MG tablet Take 1 tablet (20 mg total) by mouth daily. 12/29/10   Shawna Orleans, Doe-Hyun R, DO  saxagliptin HCl (ONGLYZA) 5 MG TABS tablet Take 5 mg by mouth daily.    [provider]  senna-docusate (SENOKOT-S) 8.6-50 MG tablet Take 1 tablet by mouth at bedtime as needed for mild constipation. 04/24/17   Waldron Session, MD    Family History Family History  Problem Relation Age of Onset  . Heart disease Brother        CAD  . Diabetes Brother   . COPD Brother   . Emphysema Brother     Social History Social History  Substance Use Topics  . Smoking status: Current Every Day Smoker    Types: Cigarettes  . Smokeless tobacco: Never Used  . Alcohol use No     Allergies   Procaine; Procaine hcl; and Atorvastatin   Review of Systems Review of Systems  Unable to perform ROS: Dementia  Constitutional: Negative for fever.  Respiratory: Positive for cough,  sputum production and shortness of breath.      Physical Exam Updated Vital Signs BP 126/63   Pulse 72   Temp (!) 97.1 F (36.2 C) (Axillary)   Resp 15   Ht 5\' 5"  (1.651 m)   Wt 81.6 kg (180 lb)   SpO2 94%   BMI 29.95 kg/m   Physical Exam  Constitutional: He appears well-developed and well-nourished.  HENT:  Head: Normocephalic and atraumatic.  Eyes: Pupils are equal, round, and reactive to light. Conjunctivae and EOM are normal.  Neck: Normal range of motion.  Cardiovascular: Normal rate and normal heart sounds.   Pulmonary/Chest: Effort normal. No respiratory distress. He has wheezes.  Abdominal: Soft. He exhibits no  distension. There is no tenderness.  Musculoskeletal: Normal range of motion. He exhibits no deformity.  Neurological: He is alert.  Skin: Skin is warm and dry.  Nursing note and vitals reviewed.    ED Treatments / Results  Labs (all labs ordered are listed, but only abnormal results are displayed) Labs Reviewed  BLOOD GAS, VENOUS - Abnormal; Notable for the following:       Result Value   pCO2, Ven 69.4 (*)    pO2, Ven 77.9 (*)    Bicarbonate 36.7 (*)    Acid-Base Excess 14.9 (*)    All other components within normal limits  CBC WITH DIFFERENTIAL/PLATELET - Abnormal; Notable for the following:    Hemoglobin 12.1 (*)    RDW 19.8 (*)    All other components within normal limits  COMPREHENSIVE METABOLIC PANEL - Abnormal; Notable for the following:    Potassium 3.3 (*)    Chloride 92 (*)    CO2 39 (*)    Glucose, Bld 316 (*)    Calcium 8.4 (*)    Total Protein 5.9 (*)    Albumin 2.9 (*)    AST 14 (*)    All other components within normal limits  BRAIN NATRIURETIC PEPTIDE - Abnormal; Notable for the following:    B Natriuretic Peptide 187.0 (*)    All other components within normal limits  TROPONIN I    EKG  EKG Interpretation  Date/Time:  Thursday June 22 2017 20:18:23 EDT Ventricular Rate:  69 PR Interval:    QRS Duration: 100 QT  Interval:  435 QTC Calculation: 466 R Axis:   83 Text Interpretation:  Sinus rhythm Atrial premature complexes in couplets Borderline right axis deviation No significant change since last tracing Confirmed by Merrily Pew [phone]) on 06/22/2017 8:42:39 PM       Radiology Dg Chest Portable 1 View  Result Date: 06/22/2017 CLINICAL DATA:  Shortness of breath. EXAM: PORTABLE CHEST 1 VIEW COMPARISON:  Chest x-ray dated May 13, 2017. FINDINGS: Stable mild cardiomegaly. Bibasilar opacities are largely unchanged. No large pleural effusion. No pneumothorax. No acute osseous abnormality. IMPRESSION: Largely unchanged bibasilar opacities, likely atelectasis. Stable cardiomegaly. Electronically Signed   By: Titus Dubin M.D.   On: 06/22/2017 20:47    Procedures Procedures (including critical care time)  Medications Ordered in ED Medications  albuterol (PROVENTIL,VENTOLIN) solution continuous neb (10 mg/hr Nebulization New Bag/Given 06/22/17 2015)  furosemide (LASIX) injection 40 mg (not administered)  ipratropium (ATROVENT) nebulizer solution 1 mg (1 mg Nebulization Given 06/22/17 2015)  magnesium sulfate IVPB 2 g 50 mL (0 g Intravenous Stopped 06/22/17 2217)  azithromycin (ZITHROMAX) 500 mg in dextrose 5 % 250 mL IVPB (0 mg Intravenous Stopped 06/22/17 2218)     Initial Impression / Assessment and Plan / ED Course  I have reviewed the triage vital signs and the nursing notes.  Pertinent labs & imaging results that were available during my care of the patient were reviewed by me and considered in my medical decision making (see chart for details).       Likely COPD exacerbation however does have a slightly elevated BNP so distally she is given as well. Patient had a continuous albuterol, steroids prior to arrival with EMS, antibiotics, DuoNeb prior to arrival, magnesium is still having some tachypnea decreased breath sounds and wheezing. Patient does have a history of being intubated  for COPD exacerbations making himn high risk for decompensation so we'll admit the hospital for observation.  Final Clinical Impressions(s) /  ED Diagnoses   Final diagnoses:  COPD exacerbation [facility])    New Prescriptions New Prescriptions   No medications on file     Fallon Haecker, Corene Cornea, MD 06/22/17 2235

## 2017-06-23 DIAGNOSIS — J441 Chronic obstructive pulmonary disease with (acute) exacerbation: Secondary | ICD-10-CM

## 2017-06-23 DIAGNOSIS — I4891 Unspecified atrial fibrillation: Secondary | ICD-10-CM | POA: Diagnosis not present

## 2017-06-23 DIAGNOSIS — E1149 Type 2 diabetes mellitus with other diabetic neurological complication: Secondary | ICD-10-CM | POA: Diagnosis not present

## 2017-06-23 LAB — COMPREHENSIVE METABOLIC PANEL
ALK PHOS: 80 U/L (ref 38–126)
ALT: 20 U/L (ref 17–63)
ANION GAP: 13 (ref 5–15)
AST: 14 U/L — ABNORMAL LOW (ref 15–41)
Albumin: 3.1 g/dL — ABNORMAL LOW (ref 3.5–5.0)
BILIRUBIN TOTAL: 0.7 mg/dL (ref 0.3–1.2)
BUN: 11 mg/dL (ref 6–20)
CALCIUM: 8.1 mg/dL — AB (ref 8.9–10.3)
CO2: 37 mmol/L — ABNORMAL HIGH (ref 22–32)
CREATININE: 0.93 mg/dL (ref 0.61–1.24)
Chloride: 91 mmol/L — ABNORMAL LOW (ref 101–111)
Glucose, Bld: 429 mg/dL — ABNORMAL HIGH (ref 65–99)
Potassium: 4.4 mmol/L (ref 3.5–5.1)
SODIUM: 141 mmol/L (ref 135–145)
TOTAL PROTEIN: 6.4 g/dL — AB (ref 6.5–8.1)

## 2017-06-23 LAB — GLUCOSE, CAPILLARY
GLUCOSE-CAPILLARY: 192 mg/dL — AB (ref 65–99)
GLUCOSE-CAPILLARY: 293 mg/dL — AB (ref 65–99)

## 2017-06-23 LAB — CBC
HEMATOCRIT: 43.1 % (ref 39.0–52.0)
HEMOGLOBIN: 13.1 g/dL (ref 13.0–17.0)
MCH: 26.7 pg (ref 26.0–34.0)
MCHC: 30.4 g/dL (ref 30.0–36.0)
MCV: 88 fL (ref 78.0–100.0)
Platelets: 188 10*3/uL (ref 150–400)
RBC: 4.9 MIL/uL (ref 4.22–5.81)
RDW: 19.2 % — ABNORMAL HIGH (ref 11.5–15.5)
WBC: 8.9 10*3/uL (ref 4.0–10.5)

## 2017-06-23 LAB — HEMOGLOBIN A1C
Hgb A1c MFr Bld: 8.8 % — ABNORMAL HIGH (ref 4.8–5.6)
MEAN PLASMA GLUCOSE: 205.86 mg/dL

## 2017-06-23 LAB — CBG MONITORING, ED
Glucose-Capillary: 360 mg/dL — ABNORMAL HIGH (ref 65–99)
Glucose-Capillary: 345 mg/dL — ABNORMAL HIGH (ref 65–99)

## 2017-06-23 MED ORDER — SODIUM CHLORIDE 0.9% FLUSH
3.0000 mL | Freq: Two times a day (BID) | INTRAVENOUS | Status: DC
  Administered 2017-06-23 – 2017-06-25 (×6): 3 mL via INTRAVENOUS

## 2017-06-23 MED ORDER — INSULIN ASPART 100 UNIT/ML ~~LOC~~ SOLN
0.0000 [IU] | Freq: Three times a day (TID) | SUBCUTANEOUS | Status: DC
  Administered 2017-06-23: 4 [IU] via SUBCUTANEOUS
  Administered 2017-06-23: 15 [IU] via SUBCUTANEOUS
  Administered 2017-06-24: 20 [IU] via SUBCUTANEOUS
  Administered 2017-06-24: 7 [IU] via SUBCUTANEOUS
  Administered 2017-06-24: 15 [IU] via SUBCUTANEOUS
  Administered 2017-06-25: 20 [IU] via SUBCUTANEOUS
  Administered 2017-06-25: 11 [IU] via SUBCUTANEOUS

## 2017-06-23 MED ORDER — IPRATROPIUM-ALBUTEROL 0.5-2.5 (3) MG/3ML IN SOLN
3.0000 mL | RESPIRATORY_TRACT | Status: DC
  Administered 2017-06-23 – 2017-06-25 (×13): 3 mL via RESPIRATORY_TRACT
  Filled 2017-06-23 (×13): qty 3

## 2017-06-23 MED ORDER — ALPRAZOLAM 0.25 MG PO TABS
0.2500 mg | ORAL_TABLET | Freq: Three times a day (TID) | ORAL | Status: DC | PRN
  Administered 2017-06-23 – 2017-06-25 (×4): 0.25 mg via ORAL
  Filled 2017-06-23 (×4): qty 1

## 2017-06-23 MED ORDER — SODIUM CHLORIDE 0.9% FLUSH
3.0000 mL | INTRAVENOUS | Status: DC | PRN
  Administered 2017-06-23: 3 mL via INTRAVENOUS

## 2017-06-23 MED ORDER — ACETAMINOPHEN 325 MG PO TABS: 650.0000 mg | ORAL_TABLET | Freq: Four times a day (QID) | ORAL | Status: DC | PRN

## 2017-06-23 MED ORDER — NICOTINE 14 MG/24HR TD PT24
14.0000 mg | MEDICATED_PATCH | Freq: Every day | TRANSDERMAL | Status: DC
  Administered 2017-06-23 – 2017-06-25 (×3): 14 mg via TRANSDERMAL
  Filled 2017-06-23 (×3): qty 1

## 2017-06-23 MED ORDER — INSULIN ASPART 100 UNIT/ML ~~LOC~~ SOLN
0.0000 [IU] | Freq: Three times a day (TID) | SUBCUTANEOUS | Status: DC
  Administered 2017-06-23: 9 [IU] via SUBCUTANEOUS
  Filled 2017-06-23: qty 1

## 2017-06-23 MED ORDER — DILTIAZEM HCL ER BEADS 300 MG PO CP24
300.0000 mg | ORAL_CAPSULE | Freq: Every day | ORAL | Status: DC
  Administered 2017-06-23 – 2017-06-25 (×3): 300 mg via ORAL
  Filled 2017-06-23 (×6): qty 1

## 2017-06-23 MED ORDER — SENNOSIDES-DOCUSATE SODIUM 8.6-50 MG PO TABS
1.0000 | ORAL_TABLET | Freq: Every evening | ORAL | Status: DC | PRN
  Filled 2017-06-23: qty 1

## 2017-06-23 MED ORDER — MONTELUKAST SODIUM 10 MG PO TABS
10.0000 mg | ORAL_TABLET | Freq: Every day | ORAL | Status: DC
  Administered 2017-06-23 – 2017-06-25 (×3): 10 mg via ORAL
  Filled 2017-06-23 (×3): qty 1

## 2017-06-23 MED ORDER — ONDANSETRON HCL 4 MG/2ML IJ SOLN: 4.0000 mg | Freq: Four times a day (QID) | INTRAMUSCULAR | Status: DC | PRN

## 2017-06-23 MED ORDER — FUROSEMIDE 40 MG PO TABS
40.0000 mg | ORAL_TABLET | Freq: Every day | ORAL | Status: DC
  Administered 2017-06-24 – 2017-06-25 (×2): 40 mg via ORAL
  Filled 2017-06-23 (×2): qty 1

## 2017-06-23 MED ORDER — ORAL CARE MOUTH RINSE
15.0000 mL | Freq: Two times a day (BID) | OROMUCOSAL | Status: DC
  Administered 2017-06-23 – 2017-06-25 (×4): 15 mL via OROMUCOSAL

## 2017-06-23 MED ORDER — SODIUM CHLORIDE 0.9 % IV SOLN: 250.0000 mL | INTRAVENOUS | Status: DC | PRN

## 2017-06-23 MED ORDER — PANTOPRAZOLE SODIUM 40 MG PO TBEC
40.0000 mg | DELAYED_RELEASE_TABLET | Freq: Every day | ORAL | Status: DC
  Administered 2017-06-23 – 2017-06-25 (×3): 40 mg via ORAL
  Filled 2017-06-23 (×3): qty 1

## 2017-06-23 MED ORDER — ALBUTEROL SULFATE (2.5 MG/3ML) 0.083% IN NEBU: 2.5000 mg | INHALATION_SOLUTION | RESPIRATORY_TRACT | Status: DC | PRN

## 2017-06-23 MED ORDER — POTASSIUM CHLORIDE CRYS ER 20 MEQ PO TBCR
40.0000 meq | EXTENDED_RELEASE_TABLET | Freq: Once | ORAL | Status: AC
  Administered 2017-06-23: 40 meq via ORAL

## 2017-06-23 MED ORDER — INSULIN GLARGINE 100 UNIT/ML ~~LOC~~ SOLN
20.0000 [IU] | Freq: Every day | SUBCUTANEOUS | Status: DC
  Administered 2017-06-23 – 2017-06-24 (×2): 20 [IU] via SUBCUTANEOUS
  Filled 2017-06-23 (×4): qty 0.2

## 2017-06-23 MED ORDER — ROSUVASTATIN CALCIUM 20 MG PO TABS
20.0000 mg | ORAL_TABLET | Freq: Every day | ORAL | Status: DC
  Administered 2017-06-23 – 2017-06-25 (×3): 20 mg via ORAL
  Filled 2017-06-23 (×6): qty 1

## 2017-06-23 MED ORDER — IPRATROPIUM-ALBUTEROL 0.5-2.5 (3) MG/3ML IN SOLN
3.0000 mL | Freq: Four times a day (QID) | RESPIRATORY_TRACT | Status: DC
  Administered 2017-06-23: 3 mL via RESPIRATORY_TRACT
  Filled 2017-06-23: qty 3

## 2017-06-23 MED ORDER — ACETAMINOPHEN 650 MG RE SUPP: 650.0000 mg | Freq: Four times a day (QID) | RECTAL | Status: DC | PRN

## 2017-06-23 MED ORDER — ONDANSETRON HCL 4 MG PO TABS: 4.0000 mg | ORAL_TABLET | Freq: Four times a day (QID) | ORAL | Status: DC | PRN

## 2017-06-23 MED ORDER — APIXABAN 2.5 MG PO TABS
2.5000 mg | ORAL_TABLET | Freq: Two times a day (BID) | ORAL | Status: DC
  Administered 2017-06-23 – 2017-06-25 (×5): 2.5 mg via ORAL
  Filled 2017-06-23 (×13): qty 1

## 2017-06-23 MED ORDER — INSULIN ASPART 100 UNIT/ML ~~LOC~~ SOLN
0.0000 [IU] | Freq: Every day | SUBCUTANEOUS | Status: DC
  Administered 2017-06-23: 3 [IU] via SUBCUTANEOUS
  Administered 2017-06-24: 2 [IU] via SUBCUTANEOUS

## 2017-06-23 MED ORDER — METHYLPREDNISOLONE SODIUM SUCC 125 MG IJ SOLR
60.0000 mg | Freq: Four times a day (QID) | INTRAMUSCULAR | Status: DC
  Administered 2017-06-23 – 2017-06-24 (×6): 60 mg via INTRAVENOUS
  Filled 2017-06-23 (×6): qty 2

## 2017-06-23 MED ORDER — DEXTROSE 5 % IV SOLN
500.0000 mg | INTRAVENOUS | Status: DC
  Administered 2017-06-23 – 2017-06-24 (×2): 500 mg via INTRAVENOUS
  Filled 2017-06-23 (×5): qty 500

## 2017-06-23 NOTE — ED Notes (Signed)
Pt called out to this tech c/o shortness of breath. Respirations around 25-30/min. RN aware, respiratory paged.

## 2017-06-23 NOTE — H&P (Signed)
TRH H&P    Patient Demographics:    Raffael Bugarin, is a 76 y.o. male  MRN: [id number]  DOB - [birth year]  Admit Date - 06/22/2017  Referring MD/NP/PA: Dr. Dolly Rias  Outpatient Primary MD for the patient is System, Provider Not In  Patient coming from: home  Chief Complaint  Patient presents with  . Shortness of Breath      HPI:    Luc Shammas  is a 76 y.o. male, with history of COPDn chronic home O2, 2 L/m, atrial fibrillation on anticoagulation with Coumadin, diabetes mellitus came to hospital with worsening shortness of breath.patient also has been coughing white phlegm.  He denies chest pain, Denies nausea vomiting or diarrhea. Denies dysuria urgency or frequency of urination. Complains of abdominal discomfort No fever or chills.  In the ED patient was started on albuterol nebulizer. And breathing has improved.    Review of systems:      All other systems reviewed and are negative.   With Past History of the following :    Past Medical History:  Diagnosis Date  . A-fib ([place])   . Abdominal pain   . Adenomatous colon polyp   . Arthritis   . Asthma   . CAD (coronary artery disease)   . Cancer ([place])    skin, nose  . Cataract    bilateral  . Chronic LBP   . COPD (chronic obstructive pulmonary disease) ([place])   . Decreased libido   . Diverticulosis of colon   . Esophageal stricture   . Fatigue   . GERD (gastroesophageal reflux disease)   . History of small bowel obstruction   . Hyperlipidemia   . Hypertension   . Hypertrophy of prostate with urinary obstruction and other lower urinary tract symptoms (LUTS)   . Palpitations   . Peri-rectal abscess   . Personal history of renal calculi   . Prostatitis, acute   . Tobacco abuse       Past Surgical History:  Procedure Laterality Date  . CATARACT EXTRACTION  04/2010   bilateral  . HERNIA REPAIR    . LUMBAR LAMINECTOMY     with  Harrington rods  . PENILE PROSTHESIS PLACEMENT    . TONSILLECTOMY        Social History:      Social History  Substance Use Topics  . Smoking status: Current Every Day Smoker    Types: Cigarettes  . Smokeless tobacco: Never Used  . Alcohol use No       Family History :     Family History  Problem Relation Age of Onset  . Heart disease Brother        CAD  . Diabetes Brother   . COPD Brother   . Emphysema Brother       Home Medications:   Prior to Admission medications   Medication Sig Start Date End Date Taking? Authorizing Provider  acetaminophen (TYLENOL) 650 MG CR tablet Take 650 mg by mouth every 8 (eight) hours as needed for pain.    [provider]  albuterol (PROVENTIL HFA;VENTOLIN HFA) 108 (90 BASE) MCG/ACT inhaler Inhale 2 puffs into the lungs every 4 (four) hours as needed for wheezing.     [provider]  albuterol (PROVENTIL) (2.5 MG/3ML) 0.083% nebulizer solution Take 2.5 mg by nebulization every 4 (four) hours as needed for wheezing or shortness of breath.    [provider]  apixaban (ELIQUIS) 2.5 MG TABS tablet Take 1 tablet (2.5 mg total) by mouth 2 (two) times daily. 04/24/17   Waldron Session, MD  Cyanocobalamin (VITAMIN B-12) 2500 MCG SUBL Place 2,500 mcg under the tongue daily.    [provider]  diltiazem (TIAZAC) 300 MG 24 hr capsule Take 300 mg by mouth daily.     [provider]  doxycycline (VIBRA-TABS) 100 MG tablet Take 1 tablet (100 mg total) by mouth every 12 (twelve) hours. 05/17/17   Theodis Blaze, MD  glipiZIDE (GLUCOTROL) 5 MG tablet Take 5 mg by mouth daily before breakfast.    [provider]  ipratropium-albuterol (DUONEB) 0.5-2.5 (3) MG/3ML SOLN Take 3 mLs by nebulization QID.     [provider]  Melatonin 3 MG TABS Take 1 tablet by mouth at bedtime.    [provider]  metFORMIN (GLUMETZA) 1000 MG (MOD) 24 hr tablet Take 1,000 mg by mouth daily with breakfast.     [provider]  montelukast (SINGULAIR) 10 MG tablet Take 10 mg by mouth daily.    [provider]  nicotine (NICODERM CQ - DOSED IN MG/24 HOURS) 14 mg/24hr patch Place 1 patch (14 mg total) onto the skin daily. 04/25/17   Waldron Session, MD  omeprazole (PRILOSEC) 40 MG capsule Take 40 mg by mouth daily.    [provider]  predniSONE (DELTASONE) 10 MG tablet Take 50 mg tablet 05/18/2017 and taper down by 10 mg daily until completed 05/17/17   Theodis Blaze, MD  rosuvastatin (CRESTOR) 20 MG tablet Take 1 tablet (20 mg total) by mouth daily. 12/29/10   Shawna Orleans, Doe-Hyun R, DO  saxagliptin HCl (ONGLYZA) 5 MG TABS tablet Take 5 mg by mouth daily.    [provider]  senna-docusate (SENOKOT-S) 8.6-50 MG tablet Take 1 tablet by mouth at bedtime as needed for mild constipation. 04/24/17   Waldron Session, MD     Allergies:     Allergies  Allergen Reactions  . Procaine Other (See Comments) and Shortness Of Breath    Syncope  . Procaine Hcl Anaphylaxis and Swelling  . Atorvastatin Palpitations    REACTION: Heartburn     Physical Exam:   Vitals  Blood pressure (!) 116/56, pulse 79, temperature (!) 97.1 F (36.2 C), temperature source Axillary, resp. rate 16, height 5\' 5"  (1.651 m), weight 81.6 kg (180 lb), SpO2 (!) 88 %.  1.  General: Appears in no acute distress  2. Psychiatric:  Intact judgement and  insight, awake alert, oriented x 3.  3. Neurologic: No focal neurological deficits, all cranial nerves intact.Strength 5/5 all 4 extremities, sensation intact all 4 extremities, plantars down going.  4. Eyes :  anicteric sclerae, moist conjunctivae with no lid lag. PERRLA.  5. ENMT:  Oropharynx clear with moist mucous membranes and good dentition  6. Neck:  supple, no cervical lymphadenopathy appriciated, No thyromegaly  7. Respiratory : Normal respiratory effort, bilateral wheezing  8. Cardiovascular : RRR, no gallops, rubs or murmurs, no leg edema  9.  Gastrointestinal:  Positive bowel sounds, abdomen soft, non-tender to palpation,no hepatosplenomegaly, no rigidity  or guarding       10. Skin:  No cyanosis, normal texture and turgor, no rash, lesions or ulcers  11.Musculoskeletal:  Good muscle tone,  joints appear normal , no effusions,  normal range of motion    Data Review:    CBC  Recent Labs Lab 06/22/17 2011  WBC 8.4  HGB 12.1*  HCT 39.6  PLT 178  MCV 87.0  MCH 26.6  MCHC 30.6  RDW 19.8*  LYMPHSABS 1.9  MONOABS 0.7  EOSABS 0.1  BASOSABS 0.0   ------------------------------------------------------------------------------------------------------------------  Chemistries   Recent Labs Lab 06/22/17 2011  NA 141  K 3.3*  CL 92*  CO2 39*  GLUCOSE 316*  BUN 10  CREATININE 0.77  CALCIUM 8.4*  AST 14*  ALT 20  ALKPHOS 68  BILITOT 0.4   ------------------------------------------------------------------------------------------------------------------  ------------------------------------------------------------------------------------------------------------------ GFR: Estimated Creatinine Clearance: 74.8 mL/min (by C-G formula based on SCr of 0.77 mg/dL). Liver Function Tests:  Recent Labs Lab 06/22/17 2011  AST 14*  ALT 20  ALKPHOS 68  BILITOT 0.4  PROT 5.9*  ALBUMIN 2.9*   No results for input(s): LIPASE, AMYLASE in the last 168 hours. No results for input(s): AMMONIA in the last 168 hours. Coagulation Profile: No results for input(s): INR, PROTIME in the last 168 hours. Cardiac Enzymes:  Recent Labs Lab 06/22/17 2011  TROPONINI <0.03    --------------------------------------------------------------------------------------------------------------- Urine analysis:    Component Value Date/Time   COLORURINE AMBER (A) 05/05/2013 1621   APPEARANCEUR CLEAR 05/05/2013 1621   LABSPEC 1.025 05/05/2013 1621   PHURINE 5.5 05/05/2013 1621   GLUCOSEU NEGATIVE 05/05/2013 1621   HGBUR NEGATIVE  05/05/2013 1621   HGBUR negative 03/31/2009 1514   BILIRUBINUR SMALL (A) 05/05/2013 1621   KETONESUR NEGATIVE 05/05/2013 1621   PROTEINUR NEGATIVE 05/05/2013 1621   UROBILINOGEN 1.0 05/05/2013 1621   NITRITE NEGATIVE 05/05/2013 1621   LEUKOCYTESUR NEGATIVE 05/05/2013 1621      Imaging Results:    Dg Chest Portable 1 View  Result Date: 06/22/2017 CLINICAL DATA:  Shortness of breath. EXAM: PORTABLE CHEST 1 VIEW COMPARISON:  Chest x-ray dated May 13, 2017. FINDINGS: Stable mild cardiomegaly. Bibasilar opacities are largely unchanged. No large pleural effusion. No pneumothorax. No acute osseous abnormality. IMPRESSION: Largely unchanged bibasilar opacities, likely atelectasis. Stable cardiomegaly. Electronically Signed   By: Titus Dubin M.D.   On: 06/22/2017 20:47    My personal review of EKG: Rhythm NSR   Assessment & Plan:    Active Problems:   Diabetes mellitus ([place])   Essential hypertension   ATRIAL FIBRILLATION   COPD exacerbation ([place])   1. COPD exacerbation        Start Solu-Medrol 60 g IV every 6 hours, DuoNeb nebulizers every 6 hours        Zithromax 500 mg IV daily  2. Diabetes mellitus        Hold oral hypoglycemic agents        Start sliding scale insulin with NovoLog  3.    Atrial fibrillation         Heart rate is controlled, continue by mouth Cardizem          Anticoagulation with eliquis  DVT Prophylaxis-   Eliquis  AM Labs Ordered, also please review Full Orders  Family Communication: Admission, patients condition and plan of care including tests being ordered have been discussed with the patient  who indicate understanding and agree with the plan and Code Status.  Code Status:  Full code  Admission status:  observation    Time spent in minutes : 60 minutes   LAMA,GAGAN S M.D on 06/23/2017 at 2:55 AM  Between 7am to 7pm - Pager - [phone]. After 7pm go to [email or web address] - password [facility]  [facility] - Office  [phone]

## 2017-06-23 NOTE — Care Management Obs Status (Signed)
[place] NOTIFICATION   Patient Details  Name: Andrew [name] MRN: [id number] Date of Birth: 09/03/1941   Medicare Observation Status Notification Given:  Yes    Sherald Barge, RN 06/23/2017, 2:37 PM

## 2017-06-23 NOTE — ED Notes (Signed)
Dr sitting in chair by bedside. Hospitalist in to visit pt.

## 2017-06-23 NOTE — Care Management Note (Signed)
Case Management Note  Patient Details  Name: DSHAWN MCNAY MRN: [id number] Date of Birth: 1941/05/15  Subjective/Objective:        Admitted with COPD exacerbation. Pt from home, lives with dtr, has aid paid for by [place]. No HH services pta. Has oxygen and neb machine, BSC, RW, WC, Electric scooter. Pt does not feel he needs HH at DC. Communicates no needs or concerns.      Action/Plan: DC home with self care. CM will cont to follow.   Expected Discharge Date:    06/24/2017              Expected Discharge Plan:  Home/Self Care  In-House Referral:  NA  Discharge planning Services  CM Consult  Status of Service:  In process, will continue to follow  Sherald Barge, RN 06/23/2017, 2:45 PM

## 2017-06-23 NOTE — Progress Notes (Signed)
[facility] PROGRESS NOTE  DUEL Andrew [name] VFI:[id number] DOB: 29-Jun-1941 DOA: 06/22/2017  PCP: System, Provider Not In  Brief History/Interval Summary: 76 year old Caucasian male with a past medical history of COPD on home oxygen, history of atrial fibrillation on anticoagulation with Apixaban, diabetes mellitus on oral agents, who presented with a few day history of worsening shortness of breath, wheezing. Patient was hospitalized for further management of acute COPD exacerbation.  Reason for Visit: acute COPD exacerbation  Consultants: none  Procedures: none  Antibiotics: azithromycin  Subjective/Interval History: Patient states that he is feeling slightly better after receiving breathing treatments in the emergency department. remains short of breath. Denies any chest pain.  ROS: denies any nausea, vomiting  Objective:  Vital Signs  Vitals:   06/23/17 1000 06/23/17 1030 06/23/17 1100 06/23/17 1150  BP: 127/74 (!) 124/56 130/82   Pulse: 82 79 (!) 36   Resp:      Temp:      TempSrc:      SpO2: 97% 97% 98% 99%  Weight:      Height:        Intake/Output Summary (Last 24 hours) at 06/23/17 1245 Last data filed at 06/23/17 0805  Gross per 24 hour  Intake              310 ml  Output             1825 ml  Net            -1515 ml   Filed Weights   06/22/17 2005  Weight: 81.6 kg (180 lb)    General appearance: alert, cooperative, appears stated age and no distress Head: Normocephalic, without obvious abnormality, atraumatic Resp: tachypneic. No use of accessory muscles. Coarse breath sounds bilaterally with few wheezes. No rhonchi. Few crackles at the bases. Cardio: regular rate and rhythm, S1, S2 normal, no murmur, click, rub or gallop GI: soft, non-tender; bowel sounds normal; no masses,  no organomegaly Extremities: edema noted bilateral lower extremities Skin: Skin color, texture, turgor normal. No rashes or lesions Neurologic: no focal neurological  deficits. Noted to be anxious.  Lab Results:  Data Reviewed: I have personally reviewed following labs and imaging studies  CBC:  Recent Labs Lab 06/22/17 2011 06/23/17 0402  WBC 8.4 8.9  NEUTROABS 5.7  --   HGB 12.1* 13.1  HCT 39.6 43.1  MCV 87.0 88.0  PLT 178 416    Basic Metabolic Panel:  Recent Labs Lab 06/22/17 2011 06/23/17 0402  NA 141 141  K 3.3* 4.4  CL 92* 91*  CO2 39* 37*  GLUCOSE 316* 429*  BUN 10 11  CREATININE 0.77 0.93  CALCIUM 8.4* 8.1*    GFR: Estimated Creatinine Clearance: 64.4 mL/min (by C-G formula based on SCr of 0.93 mg/dL).  Liver Function Tests:  Recent Labs Lab 06/22/17 2011 06/23/17 0402  AST 14* 14*  ALT 20 20  ALKPHOS 68 80  BILITOT 0.4 0.7  PROT 5.9* 6.4*  ALBUMIN 2.9* 3.1*    Cardiac Enzymes:  Recent Labs Lab 06/22/17 2011  TROPONINI <0.03   CBG:  Recent Labs Lab 06/23/17 0811 06/23/17 1241  GLUCAP 360* 345*     Radiology Studies: Dg Chest Portable 1 View  Result Date: 06/22/2017 CLINICAL DATA:  Shortness of breath. EXAM: PORTABLE CHEST 1 VIEW COMPARISON:  Chest x-ray dated May 13, 2017. FINDINGS: Stable mild cardiomegaly. Bibasilar opacities are largely unchanged. No large pleural effusion. No pneumothorax. No acute osseous abnormality. IMPRESSION: Largely unchanged  bibasilar opacities, likely atelectasis. Stable cardiomegaly. Electronically Signed   By: Titus Dubin M.D.   On: 06/22/2017 20:47     Medications:  Scheduled: . apixaban  2.5 mg Oral BID  . diltiazem  300 mg Oral Daily  . insulin aspart  0-20 Units Subcutaneous TID WC  . insulin aspart  0-5 Units Subcutaneous QHS  . insulin glargine  20 Units Subcutaneous Daily  . ipratropium-albuterol  3 mL Nebulization Q4H  . methylPREDNISolone (SOLU-MEDROL) injection  60 mg Intravenous Q6H  . montelukast  10 mg Oral Daily  . nicotine  14 mg Transdermal Daily  . pantoprazole  40 mg Oral Daily  . rosuvastatin  20 mg Oral Daily  . sodium  chloride flush  3 mL Intravenous Q12H   Continuous: . sodium chloride    . azithromycin 500 mg (06/23/17 1104)   HQR:FXJOIT chloride, acetaminophen **OR** acetaminophen, albuterol, ALPRAZolam, ondansetron **OR** ondansetron (ZOFRAN) IV, senna-docusate, sodium chloride flush  Assessment/Plan:  Active Problems:   Diabetes mellitus (HCC)   Essential hypertension   ATRIAL FIBRILLATION   COPD exacerbation (HCC)    Acute COPD exacerbation with acute on chronic hypoxic and hypercapnic respiratory failure Patient unfortunately continues to smoke cigarettes. He was counseled extensively about this. Nicotine patch. Continue nebulizer treatments, steroids, antibiotics. He is on home oxygen.  History of atrial fibrillation, paroxysmal Currently in sinus rhythm. Continue Cardizem. He is anticoagulated with Apixaban.  Diabetes mellitus type II with hyperglycemia CBGs are very poorly controlled. His HbA1c is 8.8. CBGs will remain high due to steroids. He will be initiated on insulin here in the hospital. Continue to monitor CBGs.  Lower extremity edema/History of diastolic dysfunction. Echocardiogram report from August reviewed. He has normal systolic function.he does have pedal edema. He was given Lasix intravenously last night. He is supposedly on oral Lasix at home, which can resume from tomorrow. Not mentioned on his home medication last.  Tobacco abuse Counseling. Nicotine patch.   DVT Prophylaxis: he is chronically anticoagulated    Code Status: full code  Family Communication: discussed with patient and his daughter  Disposition Plan: management as outlined above.    LOS: 0 days   [facility] Pager [phone] 06/23/2017, 12:45 PM  If 7PM-7AM, please contact night-coverage at [email or web address], password [facility]

## 2017-06-24 ENCOUNTER — Encounter (HOSPITAL_COMMUNITY): Payer: Self-pay

## 2017-06-24 DIAGNOSIS — I4891 Unspecified atrial fibrillation: Secondary | ICD-10-CM

## 2017-06-24 DIAGNOSIS — E1165 Type 2 diabetes mellitus with hyperglycemia: Secondary | ICD-10-CM

## 2017-06-24 DIAGNOSIS — J441 Chronic obstructive pulmonary disease with (acute) exacerbation: Secondary | ICD-10-CM | POA: Diagnosis not present

## 2017-06-24 LAB — GLUCOSE, CAPILLARY
GLUCOSE-CAPILLARY: 347 mg/dL — AB (ref 65–99)
GLUCOSE-CAPILLARY: 233 mg/dL — AB (ref 65–99)
Glucose-Capillary: 355 mg/dL — ABNORMAL HIGH (ref 65–99)
Glucose-Capillary: 233 mg/dL — ABNORMAL HIGH (ref 65–99)

## 2017-06-24 LAB — BASIC METABOLIC PANEL
ANION GAP: 9 (ref 5–15)
BUN: 19 mg/dL (ref 6–20)
CALCIUM: 8.6 mg/dL — AB (ref 8.9–10.3)
CO2: 41 mmol/L — ABNORMAL HIGH (ref 22–32)
Chloride: 90 mmol/L — ABNORMAL LOW (ref 101–111)
Creatinine, Ser: 0.8 mg/dL (ref 0.61–1.24)
Glucose, Bld: 352 mg/dL — ABNORMAL HIGH (ref 65–99)
Potassium: 4 mmol/L (ref 3.5–5.1)
SODIUM: 140 mmol/L (ref 135–145)

## 2017-06-24 MED ORDER — METHYLPREDNISOLONE SODIUM SUCC 125 MG IJ SOLR
60.0000 mg | Freq: Two times a day (BID) | INTRAMUSCULAR | Status: DC
  Administered 2017-06-24 – 2017-06-25 (×2): 60 mg via INTRAVENOUS
  Filled 2017-06-24 (×2): qty 2

## 2017-06-24 MED ORDER — INSULIN GLARGINE 100 UNIT/ML ~~LOC~~ SOLN
25.0000 [IU] | Freq: Every day | SUBCUTANEOUS | Status: DC
  Administered 2017-06-25: 25 [IU] via SUBCUTANEOUS
  Filled 2017-06-24 (×2): qty 0.25

## 2017-06-24 MED ORDER — AZITHROMYCIN 250 MG PO TABS
500.0000 mg | ORAL_TABLET | Freq: Every day | ORAL | Status: DC
  Administered 2017-06-25: 500 mg via ORAL
  Filled 2017-06-24: qty 2

## 2017-06-24 NOTE — Progress Notes (Signed)
[facility] PROGRESS NOTE  UNO ESAU ATF:[id number] DOB: 1941/05/14 DOA: 06/22/2017  PCP: System, Provider Not In  Brief History/Interval Summary: 76 year old Caucasian male with a past medical history of COPD on home oxygen, history of atrial fibrillation on anticoagulation with Apixaban, diabetes mellitus on oral agents, who presented with a few day history of worsening shortness of breath, wheezing. Patient was hospitalized for further management of acute COPD exacerbation.  Reason for Visit: acute COPD exacerbation  Consultants: none  Procedures: none  Antibiotics: azithromycin  Subjective/Interval History: Patient states that he is feeling better.  Wants to go home.  Denies any chest pain.    ROS: Denies any nausea or vomiting  Objective:  Vital Signs  Vitals:   06/24/17 0046 06/24/17 0459 06/24/17 0655 06/24/17 0716  BP:   128/67   Pulse:   61   Resp:   20   Temp:   98 F (36.7 C)   TempSrc:   Oral   SpO2: 92% 97% 96% 91%  Weight:   81.6 kg (179 lb 14.7 oz)   Height:        Intake/Output Summary (Last 24 hours) at 06/24/17 0953 Last data filed at 06/24/17 0656  Gross per 24 hour  Intake              250 ml  Output              900 ml  Net             -650 ml   Filed Weights   06/22/17 2005 06/24/17 0655  Weight: 81.6 kg (180 lb) 81.6 kg (179 lb 14.7 oz)    General appearance: Awake alert.  In no distress Resp: Less tachypneic compared to yesterday.  Continues to have coarse breath sounds bilaterally.  Less wheezing compared to yesterday.  Overall improved air entry. Cardio: S1-S2 is normal and regular.  No S3-S4.  No rubs murmurs or bruit GI: Abdomen is soft.  Nontender nondistended.  Bowel sounds are present.  No masses organomegaly Extremities: Bilateral lower extremity edema is noted Neurologic: No focal deficits  Lab Results:  Data Reviewed: I have personally reviewed following labs and imaging studies  CBC:  Recent Labs Lab  06/22/17 2011 06/23/17 0402  WBC 8.4 8.9  NEUTROABS 5.7  --   HGB 12.1* 13.1  HCT 39.6 43.1  MCV 87.0 88.0  PLT 178 270    Basic Metabolic Panel:  Recent Labs Lab 06/22/17 2011 06/23/17 0402 06/24/17 0631  NA 141 141 140  K 3.3* 4.4 4.0  CL 92* 91* 90*  CO2 39* 37* 41*  GLUCOSE 316* 429* 352*  BUN 10 11 19   CREATININE 0.77 0.93 0.80  CALCIUM 8.4* 8.1* 8.6*    GFR: Estimated Creatinine Clearance: 74.8 mL/min (by C-G formula based on SCr of 0.8 mg/dL).  Liver Function Tests:  Recent Labs Lab 06/22/17 2011 06/23/17 0402  AST 14* 14*  ALT 20 20  ALKPHOS 68 80  BILITOT 0.4 0.7  PROT 5.9* 6.4*  ALBUMIN 2.9* 3.1*    Cardiac Enzymes:  Recent Labs Lab 06/22/17 2011  TROPONINI <0.03   CBG:  Recent Labs Lab 06/23/17 0811 06/23/17 1241 06/23/17 1644 06/23/17 2039 06/24/17 0737  GLUCAP 360* 345* 192* 293* 347*     Radiology Studies: Dg Chest Portable 1 View  Result Date: 06/22/2017 CLINICAL DATA:  Shortness of breath. EXAM: PORTABLE CHEST 1 VIEW COMPARISON:  Chest x-ray dated May 13, 2017. FINDINGS: Stable mild  cardiomegaly. Bibasilar opacities are largely unchanged. No large pleural effusion. No pneumothorax. No acute osseous abnormality. IMPRESSION: Largely unchanged bibasilar opacities, likely atelectasis. Stable cardiomegaly. Electronically Signed   By: Titus Dubin M.D.   On: 06/22/2017 20:47     Medications:  Scheduled: . apixaban  2.5 mg Oral BID  . azithromycin  500 mg Oral Daily  . diltiazem  300 mg Oral Daily  . furosemide  40 mg Oral Daily  . insulin aspart  0-20 Units Subcutaneous TID WC  . insulin aspart  0-5 Units Subcutaneous QHS  . [START ON 06/25/2017] insulin glargine  25 Units Subcutaneous Daily  . ipratropium-albuterol  3 mL Nebulization Q4H  . mouth rinse  15 mL Mouth Rinse BID  . methylPREDNISolone (SOLU-MEDROL) injection  60 mg Intravenous Q12H  . montelukast  10 mg Oral Daily  . nicotine  14 mg Transdermal Daily    . pantoprazole  40 mg Oral Daily  . rosuvastatin  20 mg Oral Daily  . sodium chloride flush  3 mL Intravenous Q12H   Continuous: . sodium chloride     GNF:AOZHYQ chloride, acetaminophen **OR** acetaminophen, albuterol, ALPRAZolam, ondansetron **OR** ondansetron (ZOFRAN) IV, senna-docusate, sodium chloride flush  Assessment/Plan:  Active Problems:   Diabetes mellitus (HCC)   Essential hypertension   ATRIAL FIBRILLATION   COPD exacerbation (HCC)    Acute COPD exacerbation with acute on chronic hypoxic and hypercapnic respiratory failure Patient is improving on current regimen.  Taper down steroids.  Continue nebulizer treatments.  Change to oral antibiotics.  Patient unfortunately continues to smoke cigarettes. He is on home oxygen.  History of atrial fibrillation, paroxysmal Currently in sinus rhythm. Continue Cardizem. He is anticoagulated with Apixaban.  Diabetes mellitus type II with hyperglycemia CBGs are very poorly controlled.  His daughter mentions that his CBGs fluctuated more at home despite all the different medications he is on.  She would like for him to be considered for insulin regimen.  CBGs here are elevated most likely due to steroids.  His HbA1c is 8.8 implying poor control.  His primary care provider is at the [place].  Daughter will try to get an appointment at the [place]. It may be reasonable to consider discharging him on Lantus.  His daughter checks his CBGs at home.  Continue to monitor CBGs.  Continue SSI.  Lower extremity edema/History of diastolic dysfunction. Echocardiogram report from August reviewed. He has normal systolic function.patient is noted to have lower extremity edema.  He is supposedly on oral Lasix at home, but not mentioned on his home medication list.  Patient is not certain either.  He is now on oral Lasix.  Can be pursued further by his outpatient providers.  Tobacco abuse He was counseled. Nicotine patch.   DVT Prophylaxis: He is  anticoagulated with apixaban.    Code Status: full code  Family Communication: Discussed with the patient and his daughter Disposition Plan: Mobilize.  Anticipate discharge in 24-48 hours.    LOS: 0 days   [facility] Pager [phone] 06/24/2017, 9:53 AM  If 7PM-7AM, please contact night-coverage at [email or web address], password [facility]

## 2017-06-25 DIAGNOSIS — J441 Chronic obstructive pulmonary disease with (acute) exacerbation: Secondary | ICD-10-CM | POA: Diagnosis not present

## 2017-06-25 DIAGNOSIS — J690 Pneumonitis due to inhalation of food and vomit: Secondary | ICD-10-CM | POA: Diagnosis not present

## 2017-06-25 LAB — GLUCOSE, CAPILLARY
GLUCOSE-CAPILLARY: 397 mg/dL — AB (ref 65–99)
Glucose-Capillary: 291 mg/dL — ABNORMAL HIGH (ref 65–99)

## 2017-06-25 LAB — CBC
HCT: 38 % — ABNORMAL LOW (ref 39.0–52.0)
Hemoglobin: 11.8 g/dL — ABNORMAL LOW (ref 13.0–17.0)
MCH: 26.5 pg (ref 26.0–34.0)
MCHC: 31.1 g/dL (ref 30.0–36.0)
MCV: 85.2 fL (ref 78.0–100.0)
Platelets: 189 10*3/uL (ref 150–400)
RBC: 4.46 MIL/uL (ref 4.22–5.81)
RDW: 18.3 % — ABNORMAL HIGH (ref 11.5–15.5)
WBC: 11.1 10*3/uL — ABNORMAL HIGH (ref 4.0–10.5)

## 2017-06-25 LAB — BASIC METABOLIC PANEL
Anion gap: 8 (ref 5–15)
BUN: 24 mg/dL — AB (ref 6–20)
CHLORIDE: 92 mmol/L — AB (ref 101–111)
CO2: 40 mmol/L — ABNORMAL HIGH (ref 22–32)
Calcium: 8.4 mg/dL — ABNORMAL LOW (ref 8.9–10.3)
Creatinine, Ser: 0.72 mg/dL (ref 0.61–1.24)
GFR calc Af Amer: >60 mL/min (ref >60)
GFR calc non Af Amer: >60 mL/min (ref >60)
GLUCOSE: 340 mg/dL — AB (ref 65–99)
POTASSIUM: 3.8 mmol/L (ref 3.5–5.1)
Sodium: 140 mmol/L (ref 135–145)

## 2017-06-25 MED ORDER — INSULIN ASPART 100 UNIT/ML FLEXPEN: PEN_INJECTOR | SUBCUTANEOUS | 11 refills | Status: DC

## 2017-06-25 MED ORDER — PREDNISONE 20 MG PO TABS: ORAL_TABLET | ORAL | 0 refills | Status: DC

## 2017-06-25 MED ORDER — POTASSIUM CHLORIDE ER 10 MEQ PO TBCR: 10.0000 meq | EXTENDED_RELEASE_TABLET | Freq: Every day | ORAL | 0 refills | Status: DC

## 2017-06-25 MED ORDER — INSULIN GLARGINE 100 UNITS/ML SOLOSTAR PEN: 25.0000 [IU] | PEN_INJECTOR | Freq: Every day | SUBCUTANEOUS | 0 refills | Status: DC

## 2017-06-25 MED ORDER — FUROSEMIDE 40 MG PO TABS: 20.0000 mg | ORAL_TABLET | Freq: Every day | ORAL | 0 refills | Status: DC

## 2017-06-25 MED ORDER — AZITHROMYCIN 500 MG PO TABS: 500.0000 mg | ORAL_TABLET | Freq: Every day | ORAL | 0 refills | Status: DC

## 2017-06-25 NOTE — Progress Notes (Signed)
Patient ambulated hallways with walker with stand by assist.  Patient SOB but states he feels fine.  O2 sats maintained in 90's.  Before walking, patient O2 sat had decreased to 84% with no oxygen on at rest, but increased when O2 re-applied.  Patient already has O2 at home and states he takes it off and on at time.  Instructed that he may need it continuously for now at home until fully over COPD exacerbation

## 2017-06-25 NOTE — Progress Notes (Signed)
Patient discharged home.  IV removed - WNL.  Reviewed DC instructions and medications with patient and daughter.  Instructed to follow up with PCP in 1 week.  Encouraged to quit smoking.  Stressed importance of completing doses of ABX and prednisone.  Verbalized understanding, assisted off unit via WC in NAD

## 2017-06-25 NOTE — Discharge Summary (Signed)
[facility]  Physician Discharge Summary   Patient ID: Andrew [name] MRN: [id number] DOB/AGE: 03-11-1941 76 y.o.  Admit date: 06/22/2017 Discharge date: 06/25/2017  PCP: System, Provider Not In  DISCHARGE DIAGNOSES:  Active Problems:   Diabetes mellitus ([place])   Essential hypertension   ATRIAL FIBRILLATION   COPD exacerbation (HCC)   RECOMMENDATIONS FOR OUTPATIENT FOLLOW UP: 1. Patient to follow-up with his providers at the [facility] for further management of diabetes 2. Patient transitioned to insulin due to poorly controlled diabetes at home   DISCHARGE CONDITION: fair  Diet recommendation: Modified carbohydrate  Filed Weights   06/22/17 2005 06/24/17 0655  Weight: 81.6 kg (180 lb) 81.6 kg (179 lb 14.7 oz)    INITIAL HISTORY: 76 year old Caucasian male with a past medical history of COPD on home oxygen, history of atrial fibrillation on anticoagulation with Apixaban, diabetes mellitus on oral agents, who presented with a few day history of worsening shortness of breath, wheezing. Patient was hospitalized for further management of acute COPD exacerbation.   HOSPITAL COURSE:   Acute COPD exacerbation with acute on chronic hypoxic and hypercapnic respiratory failure Patient was placed on nebulizer treatment steroids antibiotics.  Patient unfortunately continues to smoke at home.  He was counseled.  He is also on home oxygen.  He was told about the dangers of using oxygen while smoking.  Patient started improving.  Feels better.  Wishes to go home.   History of atrial fibrillation, paroxysmal Currently in sinus rhythm. Continue Cardizem. He is anticoagulated with Apixaban.  Diabetes mellitus type II with hyperglycemia CBGs are very poorly controlled.  His daughter mentions that his CBGs fluctuated at home despite all the different medications he is on.  She would like for him to be considered for insulin regimen.  CBGs here are elevated most likely due to steroids.  His  HbA1c is 8.8 implying poor control.  His primary care provider is at the [place].  Daughter will try to get an appointment at the [place]. It may be reasonable to consider discharging him on Lantus.  His daughter checks his CBGs at home.  He will be discharged on Lantus pen for basal coverage as well as NovoLog pen for sliding scale coverage.  Lower extremity edema/History of diastolic dysfunction. Echocardiogram report from August reviewed. He has normal systolic function.patient is noted to have lower extremity edema. He is supposedly on oral Lasix at home, but not mentioned on his home medication list.  Patient is not certain either.  He is now on oral Lasix and will be discharged on the same.  Can be pursued further by his outpatient providers.  Tobacco abuse He was counseled.   Patient states that he finds it very difficult to quit smoking.  He was asked to discuss this further with his PCP.  Overall stable.  Okay for discharge home.   PERTINENT LABS:  The results of significant diagnostics from this hospitalization (including imaging, microbiology, ancillary and laboratory) are listed below for reference.     Labs: Basic Metabolic Panel:  Recent Labs Lab 06/22/17 2011 06/23/17 0402 06/24/17 0631 06/25/17 0606  NA 141 141 140 140  K 3.3* 4.4 4.0 3.8  CL 92* 91* 90* 92*  CO2 39* 37* 41* 40*  GLUCOSE 316* 429* 352* 340*  BUN 10 11 19  24*  CREATININE 0.77 0.93 0.80 0.72  CALCIUM 8.4* 8.1* 8.6* 8.4*   Liver Function Tests:  Recent Labs Lab 06/22/17 2011 06/23/17 0402  AST 14* 14*  ALT 20  20  ALKPHOS 68 80  BILITOT 0.4 0.7  PROT 5.9* 6.4*  ALBUMIN 2.9* 3.1*   CBC:  Recent Labs Lab 06/22/17 2011 06/23/17 0402 06/25/17 0606  WBC 8.4 8.9 11.1*  NEUTROABS 5.7  --   --   HGB 12.1* 13.1 11.8*  HCT 39.6 43.1 38.0*  MCV 87.0 88.0 85.2  PLT 178 188 189   Cardiac Enzymes:  Recent Labs Lab 06/22/17 2011  TROPONINI <0.03   BNP: BNP (last 3 results)  Recent Labs   04/12/17 1200 05/14/17 0018 06/22/17 2011  BNP 317.0* 55.0 187.0*    CBG:  Recent Labs Lab 06/24/17 1138 06/24/17 1640 06/24/17 2033 06/25/17 0731 06/25/17 1132  GLUCAP 355* 233* 233* 291* 397*     IMAGING STUDIES Dg Chest Portable 1 View  Result Date: 06/22/2017 CLINICAL DATA:  Shortness of breath. EXAM: PORTABLE CHEST 1 VIEW COMPARISON:  Chest x-ray dated May 13, 2017. FINDINGS: Stable mild cardiomegaly. Bibasilar opacities are largely unchanged. No large pleural effusion. No pneumothorax. No acute osseous abnormality. IMPRESSION: Largely unchanged bibasilar opacities, likely atelectasis. Stable cardiomegaly. Electronically Signed   By: Titus Dubin M.D.   On: 06/22/2017 20:47    DISCHARGE EXAMINATION: Vitals:   06/25/17 0332 06/25/17 0347 06/25/17 0803 06/25/17 1144  BP:  126/63    Pulse:  81    Resp:      Temp:  97.6 F (36.4 C)    TempSrc:  Axillary    SpO2: 91% 97% 91% 97%  Weight:      Height:       General appearance: alert, cooperative, appears stated age and no distress Resp: Coarse breath sounds heard bilaterally.  No wheezing.  Normal effort. Cardio: regular rate and rhythm, S1, S2 normal, no murmur, click, rub or gallop GI: soft, non-tender; bowel sounds normal; no masses,  no organomegaly  DISPOSITION: Home with daughter  Discharge Instructions    Call MD for:  difficulty breathing, headache or visual disturbances    Complete by:  As directed    Call MD for:  extreme fatigue    Complete by:  As directed    Call MD for:  persistant dizziness or light-headedness    Complete by:  As directed    Call MD for:  persistant nausea and vomiting    Complete by:  As directed    Call MD for:  severe uncontrolled pain    Complete by:  As directed    Call MD for:  temperature >100.4    Complete by:  As directed    Diet Carb Modified    Complete by:  As directed    Discharge instructions    Complete by:  As directed    Please take your  medications as prescribed.  Please follow-up with your primary care provider for further management of your diabetes.  Please stop smoking.  You were cared for by a hospitalist during your hospital stay. If you have any questions about your discharge medications or the care you received while you were in the hospital after you are discharged, you can call the unit and asked to speak with the hospitalist on call if the hospitalist that took care of you is not available. Once you are discharged, your primary care physician will handle any further medical issues. Please note that NO REFILLS for any discharge medications will be authorized once you are discharged, as it is imperative that you return to your primary care physician (or establish a relationship with a  primary care physician if you do not have one) for your aftercare needs so that they can reassess your need for medications and monitor your lab values. If you do not have a primary care physician, you can call [phone] for a physician referral.   Increase activity slowly    Complete by:  As directed       ALLERGIES:  Allergies  Allergen Reactions  . Procaine Other (See Comments) and Shortness Of Breath    Syncope  . Procaine Hcl Anaphylaxis and Swelling  . Atorvastatin Palpitations    REACTION: Heartburn     Discharge Medication List as of 06/25/2017 12:10 PM    START taking these medications   Details  azithromycin (ZITHROMAX) 500 MG tablet Take 1 tablet (500 mg total) by mouth daily., Starting Sun 06/25/2017, Until Thu 06/29/2017, Print    furosemide (LASIX) 40 MG tablet Take 0.5 tablets (20 mg total) by mouth daily., Starting Sun 06/25/2017, Print    insulin aspart (NOVOLOG) 100 UNIT/ML FlexPen Check CBG's there etimes daily before meals. For CBG less than 150: 0 Units For CBG 150-200: 2 Units SQ For CBG 200-250: 4 Units SQ For CBG 250-300: 6 Units SQ For CBG 300-350: 8 Units SQ and call PCP for further instructions, Print      insulin glargine (LANTUS) 100 unit/mL SOPN Inject 0.25 mLs (25 Units total) into the skin daily., Starting Sun 06/25/2017, Print    potassium chloride (K-DUR) 10 MEQ tablet Take 1 tablet (10 mEq total) by mouth daily., Starting Sun 06/25/2017, Print      CONTINUE these medications which have CHANGED   Details  predniSONE (DELTASONE) 20 MG tablet Take 3 tablets twice daily for 3 days, then take 3 tablets once daily for 3 days, then take 2 tablets once daily for 3 days, then take 1 tablet once daily for 3 days, then STOP., Print      CONTINUE these medications which have NOT CHANGED   Details  acetaminophen (TYLENOL) 650 MG CR tablet Take 650 mg by mouth every 8 (eight) hours as needed for pain., Historical Med    albuterol (PROVENTIL HFA;VENTOLIN HFA) 108 (90 BASE) MCG/ACT inhaler Inhale 2 puffs into the lungs every 4 (four) hours as needed for wheezing. , Historical Med    albuterol (PROVENTIL) (2.5 MG/3ML) 0.083% nebulizer solution Take 2.5 mg by nebulization every 4 (four) hours as needed for wheezing or shortness of breath., Historical Med    diltiazem (TIAZAC) 300 MG 24 hr capsule Take 300 mg by mouth daily. , Historical Med    ipratropium-albuterol (DUONEB) 0.5-2.5 (3) MG/3ML SOLN Take 3 mLs by nebulization QID. , Historical Med    Melatonin 3 MG TABS Take 1 tablet by mouth at bedtime., Historical Med    montelukast (SINGULAIR) 10 MG tablet Take 10 mg by mouth daily., Historical Med    rosuvastatin (CRESTOR) 20 MG tablet Take 1 tablet (20 mg total) by mouth daily., Starting Wed 12/29/2010, Normal    apixaban (ELIQUIS) 2.5 MG TABS tablet Take 1 tablet (2.5 mg total) by mouth 2 (two) times daily., Starting Mon 04/24/2017, No Print    Cyanocobalamin (VITAMIN B-12) 2500 MCG SUBL Place 2,500 mcg under the tongue daily., Historical Med    nicotine (NICODERM CQ - DOSED IN MG/24 HOURS) 14 mg/24hr patch Place 1 patch (14 mg total) onto the skin daily., Starting Tue 04/25/2017, Normal     omeprazole (PRILOSEC) 40 MG capsule Take 40 mg by mouth daily., Historical Med  senna-docusate (SENOKOT-S) 8.6-50 MG tablet Take 1 tablet by mouth at bedtime as needed for mild constipation., Starting Mon 04/24/2017, No Print      STOP taking these medications     doxycycline (VIBRA-TABS) 100 MG tablet      glipiZIDE (GLUCOTROL) 5 MG tablet      metFORMIN (GLUMETZA) 1000 MG (MOD) 24 hr tablet      saxagliptin HCl (ONGLYZA) 5 MG TABS tablet          Follow-up Information    Follow up with your PCP Follow up.           TOTAL DISCHARGE TIME: 35 minutes  [facility] Pager [phone]  06/25/2017, 4:18 PM

## 2017-06-25 NOTE — Discharge Instructions (Signed)

## 2017-06-28 ENCOUNTER — Emergency Department (HOSPITAL_COMMUNITY): Payer: Medicare PPO

## 2017-06-28 ENCOUNTER — Inpatient Hospital Stay (HOSPITAL_COMMUNITY)
Admission: EM | Admit: 2017-06-28 | Discharge: 2017-07-03 | DRG: 177 | Disposition: A | Discharge status: Home Health | Payer: Medicare PPO | Attending: Internal Medicine | Admitting: Internal Medicine

## 2017-06-28 ENCOUNTER — Encounter (HOSPITAL_COMMUNITY): Payer: Self-pay

## 2017-06-28 DIAGNOSIS — I110 Hypertensive heart disease with heart failure: Secondary | ICD-10-CM | POA: Diagnosis present

## 2017-06-28 DIAGNOSIS — I5033 Acute on chronic diastolic (congestive) heart failure: Secondary | ICD-10-CM | POA: Diagnosis present

## 2017-06-28 DIAGNOSIS — I10 Essential (primary) hypertension: Secondary | ICD-10-CM | POA: Diagnosis not present

## 2017-06-28 DIAGNOSIS — I2510 Atherosclerotic heart disease of native coronary artery without angina pectoris: Secondary | ICD-10-CM | POA: Diagnosis present

## 2017-06-28 DIAGNOSIS — I719 Aortic aneurysm of unspecified site, without rupture: Secondary | ICD-10-CM | POA: Diagnosis present

## 2017-06-28 DIAGNOSIS — F17210 Nicotine dependence, cigarettes, uncomplicated: Secondary | ICD-10-CM | POA: Diagnosis present

## 2017-06-28 DIAGNOSIS — I714 Abdominal aortic aneurysm, without rupture: Secondary | ICD-10-CM | POA: Diagnosis present

## 2017-06-28 DIAGNOSIS — E785 Hyperlipidemia, unspecified: Secondary | ICD-10-CM | POA: Diagnosis present

## 2017-06-28 DIAGNOSIS — J189 Pneumonia, unspecified organism: Secondary | ICD-10-CM

## 2017-06-28 DIAGNOSIS — Z9981 Dependence on supplemental oxygen: Secondary | ICD-10-CM

## 2017-06-28 DIAGNOSIS — J690 Pneumonitis due to inhalation of food and vomit: Principal | ICD-10-CM | POA: Diagnosis present

## 2017-06-28 DIAGNOSIS — J9601 Acute respiratory failure with hypoxia: Secondary | ICD-10-CM | POA: Diagnosis not present

## 2017-06-28 DIAGNOSIS — Z886 Allergy status to analgesic agent status: Secondary | ICD-10-CM

## 2017-06-28 DIAGNOSIS — J9621 Acute and chronic respiratory failure with hypoxia: Secondary | ICD-10-CM | POA: Diagnosis present

## 2017-06-28 DIAGNOSIS — J441 Chronic obstructive pulmonary disease with (acute) exacerbation: Secondary | ICD-10-CM | POA: Diagnosis present

## 2017-06-28 DIAGNOSIS — Z720 Tobacco use: Secondary | ICD-10-CM | POA: Diagnosis not present

## 2017-06-28 DIAGNOSIS — Z794 Long term (current) use of insulin: Secondary | ICD-10-CM | POA: Diagnosis not present

## 2017-06-28 DIAGNOSIS — I480 Paroxysmal atrial fibrillation: Secondary | ICD-10-CM | POA: Diagnosis present

## 2017-06-28 DIAGNOSIS — K219 Gastro-esophageal reflux disease without esophagitis: Secondary | ICD-10-CM | POA: Diagnosis present

## 2017-06-28 DIAGNOSIS — R0600 Dyspnea, unspecified: Secondary | ICD-10-CM

## 2017-06-28 DIAGNOSIS — E1165 Type 2 diabetes mellitus with hyperglycemia: Secondary | ICD-10-CM | POA: Diagnosis present

## 2017-06-28 DIAGNOSIS — J449 Chronic obstructive pulmonary disease, unspecified: Secondary | ICD-10-CM | POA: Diagnosis not present

## 2017-06-28 DIAGNOSIS — Z888 Allergy status to other drugs, medicaments and biological substances status: Secondary | ICD-10-CM | POA: Diagnosis not present

## 2017-06-28 DIAGNOSIS — J42 Unspecified chronic bronchitis: Secondary | ICD-10-CM | POA: Diagnosis not present

## 2017-06-28 HISTORY — DX: Cerebral infarction, unspecified: I63.9

## 2017-06-28 HISTORY — DX: Heart failure, unspecified: I50.9

## 2017-06-28 LAB — CBC WITH DIFFERENTIAL/PLATELET
Basophils Absolute: 0 10*3/uL (ref 0.0–0.1)
Basophils Relative: 0 %
EOS PCT: 1 %
Eosinophils Absolute: 0.1 10*3/uL (ref 0.0–0.7)
HEMATOCRIT: 41.6 % (ref 39.0–52.0)
HEMOGLOBIN: 12.9 g/dL — AB (ref 13.0–17.0)
LYMPHS ABS: 0.9 10*3/uL (ref 0.7–4.0)
LYMPHS PCT: 7 %
MCH: 26.5 pg (ref 26.0–34.0)
MCHC: 31 g/dL (ref 30.0–36.0)
MCV: 85.4 fL (ref 78.0–100.0)
Monocytes Absolute: 0.4 10*3/uL (ref 0.1–1.0)
Monocytes Relative: 3 %
NEUTROS ABS: 11.9 10*3/uL — AB (ref 1.7–7.7)
NEUTROS PCT: 89 %
Platelets: 126 10*3/uL — ABNORMAL LOW (ref 150–400)
RBC: 4.87 MIL/uL (ref 4.22–5.81)
RDW: 18.6 % — ABNORMAL HIGH (ref 11.5–15.5)
WBC: 13.3 10*3/uL — AB (ref 4.0–10.5)

## 2017-06-28 LAB — BASIC METABOLIC PANEL
ANION GAP: 6 (ref 5–15)
BUN: 12 mg/dL (ref 6–20)
CO2: 36 mmol/L — AB (ref 22–32)
Calcium: 8.5 mg/dL — ABNORMAL LOW (ref 8.9–10.3)
Chloride: 97 mmol/L — ABNORMAL LOW (ref 101–111)
Creatinine, Ser: 0.67 mg/dL (ref 0.61–1.24)
GFR calc non Af Amer: >60 mL/min (ref >60)
GLUCOSE: 270 mg/dL — AB (ref 65–99)
POTASSIUM: 4.2 mmol/L (ref 3.5–5.1)
Sodium: 139 mmol/L (ref 135–145)

## 2017-06-28 LAB — HEPATIC FUNCTION PANEL
ALBUMIN: 2.8 g/dL — AB (ref 3.5–5.0)
ALK PHOS: 67 U/L (ref 38–126)
ALT: 20 U/L (ref 17–63)
AST: 11 U/L — AB (ref 15–41)
BILIRUBIN INDIRECT: 0.8 mg/dL (ref 0.3–0.9)
Bilirubin, Direct: 0.1 mg/dL (ref 0.1–0.5)
TOTAL PROTEIN: 5.5 g/dL — AB (ref 6.5–8.1)
Total Bilirubin: 0.9 mg/dL (ref 0.3–1.2)

## 2017-06-28 LAB — GLUCOSE, CAPILLARY
GLUCOSE-CAPILLARY: 535 mg/dL — AB (ref 65–99)
GLUCOSE-CAPILLARY: 204 mg/dL — AB (ref 65–99)

## 2017-06-28 LAB — TROPONIN I: Troponin I: <0.03 ng/mL (ref <0.03)

## 2017-06-28 LAB — LACTIC ACID, PLASMA: Lactic Acid, Venous: 1 mmol/L (ref 0.5–1.9)

## 2017-06-28 LAB — BRAIN NATRIURETIC PEPTIDE: B NATRIURETIC PEPTIDE 5: 88 pg/mL (ref 0.0–100.0)

## 2017-06-28 MED ORDER — FUROSEMIDE 20 MG PO TABS
20.0000 mg | ORAL_TABLET | Freq: Every day | ORAL | Status: DC
  Administered 2017-06-29: 20 mg via ORAL
  Filled 2017-06-28: qty 1

## 2017-06-28 MED ORDER — IPRATROPIUM-ALBUTEROL 0.5-2.5 (3) MG/3ML IN SOLN: 3.0000 mL | RESPIRATORY_TRACT | Status: DC | PRN

## 2017-06-28 MED ORDER — ALBUTEROL SULFATE (2.5 MG/3ML) 0.083% IN NEBU
2.5000 mg | INHALATION_SOLUTION | Freq: Once | RESPIRATORY_TRACT | Status: AC
Start: 2017-06-28 — End: 2017-06-28
  Administered 2017-06-28: 2.5 mg via RESPIRATORY_TRACT
  Filled 2017-06-28: qty 3

## 2017-06-28 MED ORDER — IPRATROPIUM-ALBUTEROL 0.5-2.5 (3) MG/3ML IN SOLN
3.0000 mL | Freq: Four times a day (QID) | RESPIRATORY_TRACT | Status: DC
  Administered 2017-06-28 – 2017-07-03 (×19): 3 mL via RESPIRATORY_TRACT
  Filled 2017-06-28 (×19): qty 3

## 2017-06-28 MED ORDER — SODIUM CHLORIDE 0.9 % IV SOLN: 250.0000 mL | INTRAVENOUS | Status: DC | PRN

## 2017-06-28 MED ORDER — INSULIN ASPART 100 UNIT/ML ~~LOC~~ SOLN: 0.0000 [IU] | Freq: Three times a day (TID) | SUBCUTANEOUS | Status: DC

## 2017-06-28 MED ORDER — LEVOFLOXACIN IN D5W 500 MG/100ML IV SOLN
500.0000 mg | INTRAVENOUS | Status: DC
  Administered 2017-06-28 – 2017-07-01 (×4): 500 mg via INTRAVENOUS
  Filled 2017-06-28 (×4): qty 100

## 2017-06-28 MED ORDER — DILTIAZEM HCL ER BEADS 300 MG PO CP24
300.0000 mg | ORAL_CAPSULE | Freq: Every day | ORAL | Status: DC
  Administered 2017-06-28 – 2017-07-03 (×5): 300 mg via ORAL
  Filled 2017-06-28 (×6): qty 1

## 2017-06-28 MED ORDER — ACETAMINOPHEN 650 MG RE SUPP
650.0000 mg | Freq: Four times a day (QID) | RECTAL | Status: DC | PRN
Start: 2017-06-28 — End: 2017-07-03

## 2017-06-28 MED ORDER — INSULIN GLARGINE 100 UNIT/ML ~~LOC~~ SOLN
15.0000 [IU] | Freq: Every day | SUBCUTANEOUS | Status: DC
  Administered 2017-06-28 – 2017-06-29 (×2): 15 [IU] via SUBCUTANEOUS
  Filled 2017-06-28 (×3): qty 0.15

## 2017-06-28 MED ORDER — INSULIN ASPART 100 UNIT/ML ~~LOC~~ SOLN
0.0000 [IU] | Freq: Three times a day (TID) | SUBCUTANEOUS | Status: DC
  Administered 2017-06-29 – 2017-06-30 (×4): 8 [IU] via SUBCUTANEOUS
  Administered 2017-06-30: 5 [IU] via SUBCUTANEOUS
  Administered 2017-06-30: 2 [IU] via SUBCUTANEOUS
  Administered 2017-07-01: 3 [IU] via SUBCUTANEOUS
  Administered 2017-07-01: 5 [IU] via SUBCUTANEOUS
  Administered 2017-07-01: 3 [IU] via SUBCUTANEOUS
  Administered 2017-07-02: 11 [IU] via SUBCUTANEOUS
  Administered 2017-07-02: 8 [IU] via SUBCUTANEOUS
  Administered 2017-07-02 – 2017-07-03 (×2): 15 [IU] via SUBCUTANEOUS
  Administered 2017-07-03: 3 [IU] via SUBCUTANEOUS

## 2017-06-28 MED ORDER — PIPERACILLIN-TAZOBACTAM 3.375 G IVPB 30 MIN
3.3750 g | Freq: Once | INTRAVENOUS | Status: AC
  Administered 2017-06-28: 3.375 g via INTRAVENOUS
  Filled 2017-06-28: qty 50

## 2017-06-28 MED ORDER — ROSUVASTATIN CALCIUM 20 MG PO TABS
20.0000 mg | ORAL_TABLET | Freq: Every day | ORAL | Status: DC
  Administered 2017-06-28 – 2017-07-03 (×6): 20 mg via ORAL
  Filled 2017-06-28 (×6): qty 1

## 2017-06-28 MED ORDER — INFLUENZA VAC SPLIT HIGH-DOSE 0.5 ML IM SUSY
0.5000 mL | PREFILLED_SYRINGE | INTRAMUSCULAR | Status: DC
  Filled 2017-06-28: qty 0.5

## 2017-06-28 MED ORDER — IPRATROPIUM-ALBUTEROL 0.5-2.5 (3) MG/3ML IN SOLN: 3.0000 mL | Freq: Four times a day (QID) | RESPIRATORY_TRACT | Status: DC

## 2017-06-28 MED ORDER — SODIUM CHLORIDE 0.9% FLUSH
3.0000 mL | Freq: Two times a day (BID) | INTRAVENOUS | Status: DC
  Administered 2017-06-28 – 2017-07-03 (×10): 3 mL via INTRAVENOUS

## 2017-06-28 MED ORDER — ACETAMINOPHEN 325 MG PO TABS: 650.0000 mg | ORAL_TABLET | Freq: Four times a day (QID) | ORAL | Status: DC | PRN

## 2017-06-28 MED ORDER — IOPAMIDOL (ISOVUE-300) INJECTION 61%
75.0000 mL | Freq: Once | INTRAVENOUS | Status: AC | PRN
  Administered 2017-06-28: 75 mL via INTRAVENOUS

## 2017-06-28 MED ORDER — MELATONIN 3 MG PO TABS: 1.0000 | ORAL_TABLET | Freq: Every day | ORAL | Status: DC

## 2017-06-28 MED ORDER — ONDANSETRON HCL 4 MG/2ML IJ SOLN: 4.0000 mg | Freq: Four times a day (QID) | INTRAMUSCULAR | Status: DC | PRN

## 2017-06-28 MED ORDER — PANTOPRAZOLE SODIUM 40 MG PO TBEC
40.0000 mg | DELAYED_RELEASE_TABLET | Freq: Every day | ORAL | Status: DC
  Administered 2017-06-28 – 2017-07-03 (×6): 40 mg via ORAL
  Filled 2017-06-28 (×6): qty 1

## 2017-06-28 MED ORDER — FUROSEMIDE 10 MG/ML IJ SOLN
40.0000 mg | Freq: Two times a day (BID) | INTRAMUSCULAR | Status: DC
  Administered 2017-06-28 – 2017-06-30 (×4): 40 mg via INTRAVENOUS
  Filled 2017-06-28 (×4): qty 4

## 2017-06-28 MED ORDER — VITAMIN B-12 1000 MCG PO TABS
2500.0000 ug | ORAL_TABLET | Freq: Every day | ORAL | Status: DC
  Administered 2017-06-28 – 2017-07-03 (×6): 2500 ug via ORAL
  Filled 2017-06-28 (×6): qty 3

## 2017-06-28 MED ORDER — ONDANSETRON HCL 4 MG PO TABS: 4.0000 mg | ORAL_TABLET | Freq: Four times a day (QID) | ORAL | Status: DC | PRN

## 2017-06-28 MED ORDER — MONTELUKAST SODIUM 10 MG PO TABS
10.0000 mg | ORAL_TABLET | Freq: Every day | ORAL | Status: DC
  Administered 2017-06-28 – 2017-07-03 (×6): 10 mg via ORAL
  Filled 2017-06-28 (×6): qty 1

## 2017-06-28 MED ORDER — METHYLPREDNISOLONE SODIUM SUCC 125 MG IJ SOLR
125.0000 mg | Freq: Once | INTRAMUSCULAR | Status: AC
  Administered 2017-06-28: 125 mg via INTRAVENOUS
  Filled 2017-06-28: qty 2

## 2017-06-28 MED ORDER — INSULIN ASPART 100 UNIT/ML ~~LOC~~ SOLN
15.0000 [IU] | Freq: Once | SUBCUTANEOUS | Status: AC
  Administered 2017-06-28: 15 [IU] via SUBCUTANEOUS

## 2017-06-28 MED ORDER — ENOXAPARIN SODIUM 40 MG/0.4ML ~~LOC~~ SOLN
40.0000 mg | SUBCUTANEOUS | Status: DC
  Administered 2017-06-28 – 2017-07-03 (×6): 40 mg via SUBCUTANEOUS
  Filled 2017-06-28 (×6): qty 0.4

## 2017-06-28 MED ORDER — ALBUTEROL SULFATE (2.5 MG/3ML) 0.083% IN NEBU
2.5000 mg | INHALATION_SOLUTION | RESPIRATORY_TRACT | Status: DC | PRN
Start: 2017-06-28 — End: 2017-07-03
  Administered 2017-06-28 – 2017-07-03 (×5): 2.5 mg via RESPIRATORY_TRACT
  Filled 2017-06-28 (×5): qty 3

## 2017-06-28 MED ORDER — VANCOMYCIN HCL IN DEXTROSE 1-5 GM/200ML-% IV SOLN
1000.0000 mg | Freq: Once | INTRAVENOUS | Status: AC
  Administered 2017-06-28: 1000 mg via INTRAVENOUS
  Filled 2017-06-28: qty 200

## 2017-06-28 MED ORDER — INSULIN GLARGINE 100 UNIT/ML ~~LOC~~ SOLN
SUBCUTANEOUS | Status: AC
  Filled 2017-06-28: qty 10

## 2017-06-28 MED ORDER — IPRATROPIUM-ALBUTEROL 0.5-2.5 (3) MG/3ML IN SOLN
3.0000 mL | Freq: Once | RESPIRATORY_TRACT | Status: AC
  Administered 2017-06-28: 3 mL via RESPIRATORY_TRACT
  Filled 2017-06-28: qty 3

## 2017-06-28 MED ORDER — ALPRAZOLAM 0.25 MG PO TABS
0.2500 mg | ORAL_TABLET | Freq: Once | ORAL | Status: AC
  Administered 2017-06-28: 0.25 mg via ORAL
  Filled 2017-06-28: qty 1

## 2017-06-28 NOTE — ED Notes (Signed)
Pt 02 up to 4L [place]. Due to pt staying around 90% on 3 L

## 2017-06-28 NOTE — H&P (Addendum)
History and Physical    Andrew [name] OIB:[id number] DOB: 1941/08/18 DOA: 06/28/2017  PCP: [facility]   Patient coming from: Home  Chief Complaint: Dyspnea  HPI: Andrew [name] is a 76 y.o. male with medical history significant of chronic hypoxic respiratory failure on 3 L per min of supplemental oxygen at home. He complains of worsening dyspnea for last 48 hours, progressive and severe to the point where he has symptoms with minimal efforts, associated with lower extremity edema but no significant cough, chest pain or PND. No fevers or chills. No improving or worsening factors, refractive to supplemental oxygen per nasal cannula and use of bronchodilator therapy at home per nebulizer. Patient complains of persistent dyspnea since August 2018, after he was released from hospital, treated for COPD exacerbation, and required invasive mechanical ventilation.  He was rehospitalized one month later with healthcare associated pneumonia, then October 18 with a COPD exacerbation.   He uses a rolling walker at home for ambulation.    ED Course: Patient was evaluated and found to be hypoxic, further imaging with chest CT, found to have a right lower lobe infiltrate. Placement of antibiotic therapy, and referred for admission.  Review of Systems:  1. General: No fevers, no chills, no weight gain or weight loss 2. ENT: No runny nose or sore throat, no hearing disturbances 3. Pulmonary: Psotive dyspnea, no cough, wheezing, or hemoptysis 4. Cardiovascular: No angina, claudication, positive worsening lower extremity edema, but no pnd or orthopnea 5. Gastrointestinal: No nausea or vomiting, no diarrhea or constipation 6. Hematology: No easy bruisability or frequent infections 7. Urology: No dysuria, hematuria or increased urinary frequency 8. Dermatology: No rashes. 9. Neurology: No seizures or paresthesias 10. Musculoskeletal: No joint pain or deformities  Past Medical History:  Diagnosis  Date  . A-fib ([place])   . Abdominal pain   . Adenomatous colon polyp   . Arthritis   . Asthma   . CAD (coronary artery disease)   . Cancer ([place])    skin, nose  . Cataract    bilateral  . Chronic LBP   . COPD (chronic obstructive pulmonary disease) ([place])   . Decreased libido   . Diverticulosis of colon   . Esophageal stricture   . Fatigue   . GERD (gastroesophageal reflux disease)   . History of small bowel obstruction   . Hyperlipidemia   . Hypertension   . Hypertrophy of prostate with urinary obstruction and other lower urinary tract symptoms (LUTS)   . Palpitations   . Peri-rectal abscess   . Personal history of renal calculi   . Prostatitis, acute   . Tobacco abuse     Past Surgical History:  Procedure Laterality Date  . CATARACT EXTRACTION  04/2010   bilateral  . HERNIA REPAIR    . LUMBAR LAMINECTOMY     with Harrington rods  . PENILE PROSTHESIS PLACEMENT    . TONSILLECTOMY       reports that he has been smoking Cigarettes.  He has never used smokeless tobacco. He reports that he does not drink alcohol or use drugs.  Allergies  Allergen Reactions  . Procaine Other (See Comments) and Shortness Of Breath    Syncope  . Procaine Hcl Anaphylaxis and Swelling  . Atorvastatin Palpitations    REACTION: Heartburn    Family History  Problem Relation Age of Onset  . Heart disease Brother        CAD  . Diabetes Brother   . COPD Brother   .  Emphysema Brother     Prior to Admission medications   Medication Sig Start Date End Date Taking? Authorizing Provider  acetaminophen (TYLENOL) 650 MG CR tablet Take 650 mg by mouth every 8 (eight) hours as needed for pain.   Yes [provider]  albuterol (PROVENTIL HFA;VENTOLIN HFA) 108 (90 BASE) MCG/ACT inhaler Inhale 2 puffs into the lungs every 4 (four) hours as needed for wheezing.    Yes [provider]  albuterol (PROVENTIL) (2.5 MG/3ML) 0.083% nebulizer solution Take 2.5 mg by nebulization every 4  (four) hours as needed for wheezing or shortness of breath.   Yes [provider]  azithromycin (ZITHROMAX) 500 MG tablet Take 1 tablet (500 mg total) by mouth daily. 06/25/17 06/29/17 Yes Bonnielee Haff, MD  Cyanocobalamin (VITAMIN B-12) 2500 MCG SUBL Place 2,500 mcg under the tongue daily.   Yes [provider]  diltiazem (TIAZAC) 300 MG 24 hr capsule Take 300 mg by mouth daily.    Yes [provider]  furosemide (LASIX) 40 MG tablet Take 0.5 tablets (20 mg total) by mouth daily. 06/25/17  Yes Bonnielee Haff, MD  insulin aspart (NOVOLOG) 100 UNIT/ML FlexPen Check CBG's there etimes daily before meals. For CBG less than 150: 0 Units For CBG 150-200: 2 Units SQ For CBG 200-250: 4 Units SQ For CBG 250-300: 6 Units SQ For CBG 300-350: 8 Units SQ and call PCP for further instructions 06/25/17  Yes Bonnielee Haff, MD  insulin glargine (LANTUS) 100 unit/mL SOPN Inject 0.25 mLs (25 Units total) into the skin daily. 06/25/17  Yes Bonnielee Haff, MD  ipratropium-albuterol (DUONEB) 0.5-2.5 (3) MG/3ML SOLN Take 3 mLs by nebulization QID.    Yes [provider]  montelukast (SINGULAIR) 10 MG tablet Take 10 mg by mouth daily.   Yes [provider]  omeprazole (PRILOSEC) 40 MG capsule Take 40 mg by mouth daily.   Yes [provider]  potassium chloride (K-DUR) 10 MEQ tablet Take 1 tablet (10 mEq total) by mouth daily. 06/25/17  Yes Bonnielee Haff, MD  predniSONE (DELTASONE) 20 MG tablet Take 3 tablets twice daily for 3 days, then take 3 tablets once daily for 3 days, then take 2 tablets once daily for 3 days, then take 1 tablet once daily for 3 days, then STOP. 06/25/17  Yes Bonnielee Haff, MD  rosuvastatin (CRESTOR) 20 MG tablet Take 1 tablet (20 mg total) by mouth daily. 12/29/10  Yes Yoo, Doe-Hyun R, DO  apixaban (ELIQUIS) 2.5 MG TABS tablet Take 1 tablet (2.5 mg total) by mouth 2 (two) times daily. Patient not taking: Reported on 06/23/2017 04/24/17    Waldron Session, MD  Melatonin 3 MG TABS Take 1 tablet by mouth at bedtime.    [provider]  nicotine (NICODERM CQ - DOSED IN MG/24 HOURS) 14 mg/24hr patch Place 1 patch (14 mg total) onto the skin daily. Patient not taking: Reported on 06/23/2017 04/25/17   Waldron Session, MD  senna-docusate (SENOKOT-S) 8.6-50 MG tablet Take 1 tablet by mouth at bedtime as needed for mild constipation. Patient not taking: Reported on 06/23/2017 04/24/17   Waldron Session, MD    Physical Exam: Vitals:   06/28/17 1130 06/28/17 1200 06/28/17 1215 06/28/17 1230  BP: 132/72 130/68  114/63  Pulse: 70 74    Resp: 19 18 20  (!) 24  Temp:      TempSrc:      SpO2: 94% 97%    Weight:      Height:  Constitutional: deconditioned and ill looking appearing Vitals:   06/28/17 1130 06/28/17 1200 06/28/17 1215 06/28/17 1230  BP: 132/72 130/68  114/63  Pulse: 70 74    Resp: 19 18 20  (!) 24  Temp:      TempSrc:      SpO2: 94% 97%    Weight:      Height:       Eyes: PERRL, lids and conjunctivae pale.  Head normocephalic, nose and ears with no deformities ENMT: Mucous membranes are moist. Posterior pharynx clear of any exudate or lesions.Normal dentition.  Neck: normal, supple, no masses, no thyromegaly. Positive accessory muscle use.  Respiratory: decreased breath sounds bilaterally, no wheezing. Positive by bibasilar crackles. Increase respiratory effort. Positive accessory muscle use. Diffuse rhonchi bilaterally Cardiovascular: Regular rate and rhythm, distant heart sounds, no murmurs / rubs / gallops. Positive extremity edema +++/++++. 2+ pedal pulses. No carotid bruits. Moderate JVD. Abdomen: protuberant, no tenderness, no masses palpated. No hepatosplenomegaly. Bowel sounds positive. No ascites.  Musculoskeletal: no clubbing / cyanosis. No joint deformity upper and lower extremities. Good ROM, no contractures. Normal muscle tone.  Skin: no rashes, lesions, ulcers. No induration Neurologic: CN 2-12  grossly intact. Sensation intact, DTR normal. Strength 5/5 in all 4.     Labs on Admission: I have personally reviewed following labs and imaging studies  CBC:  Recent Labs Lab 06/22/17 2011 06/23/17 0402 06/25/17 0606 06/28/17 0909  WBC 8.4 8.9 11.1* 13.3*  NEUTROABS 5.7  --   --  11.9*  HGB 12.1* 13.1 11.8* 12.9*  HCT 39.6 43.1 38.0* 41.6  MCV 87.0 88.0 85.2 85.4  PLT 178 188 189 761*   Basic Metabolic Panel:  Recent Labs Lab 06/22/17 2011 06/23/17 0402 06/24/17 0631 06/25/17 0606 06/28/17 0909  NA 141 141 140 140 139  K 3.3* 4.4 4.0 3.8 4.2  CL 92* 91* 90* 92* 97*  CO2 39* 37* 41* 40* 36*  GLUCOSE 316* 429* 352* 340* 270*  BUN 10 11 19  24* 12  CREATININE 0.77 0.93 0.80 0.72 0.67  CALCIUM 8.4* 8.1* 8.6* 8.4* 8.5*   GFR: Estimated Creatinine Clearance: 74.7 mL/min (by C-G formula based on SCr of 0.67 mg/dL). Liver Function Tests:  Recent Labs Lab 06/22/17 2011 06/23/17 0402 06/28/17 0909  AST 14* 14* 11*  ALT 20 20 20   ALKPHOS 68 80 67  BILITOT 0.4 0.7 0.9  PROT 5.9* 6.4* 5.5*  ALBUMIN 2.9* 3.1* 2.8*   No results for input(s): LIPASE, AMYLASE in the last 168 hours. No results for input(s): AMMONIA in the last 168 hours. Coagulation Profile: No results for input(s): INR, PROTIME in the last 168 hours. Cardiac Enzymes:  Recent Labs Lab 06/22/17 2011 06/28/17 0909  TROPONINI <0.03 <0.03   BNP (last 3 results) No results for input(s): PROBNP in the last 8760 hours. HbA1C: No results for input(s): HGBA1C in the last 72 hours. CBG:  Recent Labs Lab 06/24/17 1138 06/24/17 1640 06/24/17 2033 06/25/17 0731 06/25/17 1132  GLUCAP 355* 233* 233* 291* 397*   Lipid Profile: No results for input(s): CHOL, HDL, LDLCALC, TRIG, CHOLHDL, LDLDIRECT in the last 72 hours. Thyroid Function Tests: No results for input(s): TSH, T4TOTAL, FREET4, T3FREE, THYROIDAB in the last 72 hours. Anemia Panel: No results for input(s): VITAMINB12, FOLATE, FERRITIN,  TIBC, IRON, RETICCTPCT in the last 72 hours. Urine analysis:    Component Value Date/Time   COLORURINE AMBER (A) 05/05/2013 1621   APPEARANCEUR CLEAR 05/05/2013 1621   LABSPEC 1.025 05/05/2013 1621  PHURINE 5.5 05/05/2013 1621   GLUCOSEU NEGATIVE 05/05/2013 1621   HGBUR NEGATIVE 05/05/2013 1621   HGBUR negative 03/31/2009 1514   BILIRUBINUR SMALL (A) 05/05/2013 1621   KETONESUR NEGATIVE 05/05/2013 1621   PROTEINUR NEGATIVE 05/05/2013 1621   UROBILINOGEN 1.0 05/05/2013 1621   NITRITE NEGATIVE 05/05/2013 1621   LEUKOCYTESUR NEGATIVE 05/05/2013 1621    Radiological Exams on Admission: Ct Chest W Contrast  Result Date: 06/28/2017 CLINICAL DATA:  76 year old male with history of head congestive heart failure. Increasing shortness of breath. EXAM: CT CHEST WITH CONTRAST TECHNIQUE: Multidetector CT imaging of the chest was performed during intravenous contrast administration. CONTRAST:  26mL ISOVUE-300 IOPAMIDOL (ISOVUE-300) INJECTION 61% COMPARISON:  No priors. FINDINGS: Cardiovascular: Heart size is normal. There is no significant pericardial fluid, thickening or pericardial calcification. There is aortic atherosclerosis, as well as atherosclerosis of the great vessels of the mediastinum and the coronary arteries, including calcified atherosclerotic plaque in the the main, left anterior descending, left circumflex and right coronary arteries. Calcifications of the aortic valve. Lipomatous hypertrophy of the interatrial septum. Mediastinum/Nodes: No pathologically enlarged mediastinal or hilar lymph nodes. Esophagus is unremarkable in appearance. No axillary lymphadenopathy. Lungs/Pleura: Diffuse bronchial wall thickening. Scattered areas of more severe thickening of the peribronchovascular interstitium with associated peribronchovascular micro and macronodularity. These findings are most severe in the inferior segment of the lingula and in the basal segments of the left lower lobe where there are  confluent areas of nodularity and surrounding airspace consolidation. Mild centrilobular and paraseptal emphysema. Trace left pleural effusion lying dependently. Upper Abdomen: Aortic atherosclerosis. Musculoskeletal: There are no aggressive appearing lytic or blastic lesions noted in the visualized portions of the skeleton. IMPRESSION: 1. The appearance of the chest is most compatible with multilobar bronchopneumonia, potentially related to recent aspiration. At this time, there is extensive mucoid impaction and widespread airspace disease most evident in the left lower lobe and lingula. 2. Aortic atherosclerosis, in addition to left main and 3 vessel coronary artery disease. Please note that although the presence of coronary artery calcium documents the presence of coronary artery disease, the severity of this disease and any potential stenosis cannot be assessed on this non-gated CT examination. Assessment for potential risk factor modification, dietary therapy or pharmacologic therapy may be warranted, if clinically indicated. 3. There are calcifications of the aortic valve. Echocardiographic correlation for evaluation of potential valvular dysfunction may be warranted if clinically indicated. 4. Mild centrilobular and paraseptal emphysema. 5. Lipomatous hypertrophy of the interatrial septum. This can be associated with arrhythmias and some individuals. Aortic Atherosclerosis (ICD10-I70.0) and Emphysema (ICD10-J43.9). Electronically Signed   By: Vinnie Langton M.D.   On: 06/28/2017 10:59   Dg Chest Portable 1 View  Result Date: 06/28/2017 CLINICAL DATA:  Shortness of Breath EXAM: PORTABLE CHEST 1 VIEW COMPARISON:  June 22, 2017 FINDINGS: There is patchy airspace consolidation in the left base with small left pleural effusion. Lungs elsewhere clear. Heart is upper normal in size with pulmonary vascularity within normal limits. No adenopathy. Prominence in the right paratracheal region is felt to  represent great vessel prominence in this age group. No bone lesions are evident. IMPRESSION: Left lower lobe airspace consolidation, likely pneumonia, with small left pleural effusion. Lungs elsewhere clear. Heart upper normal in size. Followup PA and lateral chest radiographs recommended in 3-4 weeks following trial of antibiotic therapy to ensure resolution and exclude underlying malignancy. Electronically Signed   By: Lowella Grip III M.D.   On: 06/28/2017 08:53    EKG:  Independently reviewed. Sinus rhythm with premature atrial complexes.   Assessment/Plan Active Problems:   Pneumonia   This is a 76 year old male who presented with worsening dyspnea, to the point where he has became symptomatic with minimal efforts. This is his fourth hospitalization since August 2018, all admissions for decompensated respiratory failure. On initial physical examination, his respiratory distress, positive accessory muscle use, blood pressure 116/72, heart rate 86, respiratory 25, oxygen saturation 93% on 4 L nasal cannula. Dry mucous membranes, pale conjunctiva, lungs with diffuse rhonchi bilaterally and bibasilar rales, distant heart sounds, no S3 or S4 gallop, protuberant abdomen, significant pitting lower extremity edema. Sodium 139, potassium 4.2, chloride 97, bicarbonate 26, glucose 270, BUN 12, creatinine 0.67, white count 13.3, hemoglobin 12.9, hematocrit 41.6, platelets 126. Chest x-ray with left lower lobe infiltrate, confirmed by CT chest. Increased vascular congestion, suggestive of pulmonary edema.   Patient will be admitted to hospital with working diagnosis of acute on chronic hypoxic respiratory failure due to healthcare associated pneumonia/aspiration pneumonia/left lower lobe, complicated by decompensated diastolic heart failure, acute on chronic.   1. Acute on chronic hypoxic respiratory failure, due to aspiration pneumonia/ left lower lobe. Patient will be admitted to the medical, he will be  placed on remote telemetry monitor, will follow with restrictive IV fluids strategy. Antibiotic therapy with IV levofloxacin, and follow-up cell count, temperature curve and cultures. Aspiration precautions, speech and swallow evaluation. Likely aspiration pneumonia, would hold on broader antibiotic therapy for now. No significant increased mucus production or cough, no wheezing, will hold on systemic steroids for now.   2. Diastolic heart failure, acute on chronic, complicated by cardiogenic pulmonary edema. Patient will be diuresed with furosemide intravenously, 40 mg twice daily, strict is and os and daily weights, to target a negative fluid balance. We will follow-up chest film after diuresis.  3. COPD. No clear-cut signs of exacerbation, will continue bronchodilator therapy with DuoNeb, hold on systemic steroids.  4. Paroxysmal atrial fibrillation. Continue diltiazem for rate control. Patient is on sinus rhythm. At home patient not on anticoagulation.  5. Type 2 diabetes mellitus. Continue glucose coverage and monitoring with insulin sliding-scale, hold on long-acting insulin for now, until calculation of insulin requirements.  6. Dyslipidemia. Continue with rosuvastatin.     DVT prophylaxis: enoxaparin Code Status: Full  Family Communication: No family at the bedside  Disposition Plan: Home/ snf  Consults called: na Admission status: Inpatient.     Mauricio Gerome Apley MD [facility] Pager [phone]  If 7PM-7AM, please contact night-coverage [email or web address] Password [email or web address]  06/28/2017, 1:00 PM

## 2017-06-28 NOTE — ED Provider Notes (Signed)
[facility] EMERGENCY DEPARTMENT Provider Note   CSN: [id number] Arrival date & time: 06/28/17  7893     History   Chief Complaint Chief Complaint  Patient presents with  . Shortness of Breath    HPI ERSEL ENSLIN is a 76 y.o. male.  Patient states that he has been getting more short of breath again.  He just left the hospital with diagnosis COPD and heart failure.   The history is provided by the patient. No language interpreter was used.  Shortness of Breath  This is a chronic problem. The problem occurs continuously.The current episode started more than 2 days ago. Pertinent negatives include no fever, no headaches, no cough, no chest pain, no abdominal pain and no rash. Precipitated by: Unknown. He has tried nothing for the symptoms. The treatment provided moderate relief.    Past Medical History:  Diagnosis Date  . A-fib ([place])   . Abdominal pain   . Adenomatous colon polyp   . Arthritis   . Asthma   . CAD (coronary artery disease)   . Cancer ([place])    skin, nose  . Cataract    bilateral  . Chronic LBP   . COPD (chronic obstructive pulmonary disease) ([place])   . Decreased libido   . Diverticulosis of colon   . Esophageal stricture   . Fatigue   . GERD (gastroesophageal reflux disease)   . History of small bowel obstruction   . Hyperlipidemia   . Hypertension   . Hypertrophy of prostate with urinary obstruction and other lower urinary tract symptoms (LUTS)   . Palpitations   . Peri-rectal abscess   . Personal history of renal calculi   . Prostatitis, acute   . Tobacco abuse     Patient Active Problem List   Diagnosis Date Noted  . Hospital acquired PNA 05/14/2017  . COPD exacerbation ([place])   . HCAP (healthcare-associated pneumonia)   . Acute respiratory failure with hypoxia and hypercapnia ([place]) 04/12/2017  . Pneumonia 10/24/2016  . AAA (abdominal aortic aneurysm) ([place]) 01/04/2011  . Dizziness, nonspecific 12/26/2010  . UNSPECIFIED PERIPHERAL VASCULAR  DISEASE 09/23/2010  . LEG CRAMPS 09/13/2010  . Diabetes mellitus ([place]) 02/11/2010  . CHRONIC OBSTRUCTIVE PULMONARY DISEASE, ACUTE EXACERBATION 02/11/2010  . OLECRANON BURSITIS, RIGHT 01/26/2010  . PENILE PAIN 12/29/2009  . ABNORMAL EJACULATION 05/05/2009  . LARYNGITIS, ACUTE 04/21/2009  . ATRIAL FIBRILLATION 04/08/2009  . PALPITATIONS 04/08/2009  . FATIGUE 03/31/2009  . LIBIDO, DECREASED 03/31/2009  . HYPERTROPHY PROSTATE W/UR OBST & OTH LUTS 03/25/2009  . ACUTE PROSTATITIS 03/25/2009  . SMALL BOWEL OBSTRUCTION, HX OF 11/28/2007  . Hyperlipidemia 11/13/2007  . COPD 10/02/2007  . LOW BACK PAIN, CHRONIC 10/02/2007  . ADENOMATOUS COLONIC POLYP 05/22/2007  . ESOPHAGEAL STRICTURE 05/22/2007  . DIVERTICULOSIS, COLON 05/22/2007  . TOBACCO ABUSE 05/08/2007  . Essential hypertension 05/08/2007  . CORONARY ARTERY DISEASE 05/08/2007  . ASTHMA 05/08/2007  . GERD 05/08/2007  . RENAL CALCULUS, HX OF 05/08/2007  . ABSCESS, PERIRECTAL, HX OF 05/08/2007  . ARTHRITIS, HX OF 05/08/2007  . PENILE PROSTHESIS 05/08/2007  . LAMINECTOMY, LUMBAR, HX OF 05/08/2007    Past Surgical History:  Procedure Laterality Date  . CATARACT EXTRACTION  04/2010   bilateral  . HERNIA REPAIR    . LUMBAR LAMINECTOMY     with Harrington rods  . PENILE PROSTHESIS PLACEMENT    . TONSILLECTOMY         Home Medications    Prior to Admission medications   Medication  Sig Start Date End Date Taking? Authorizing Provider  acetaminophen (TYLENOL) 650 MG CR tablet Take 650 mg by mouth every 8 (eight) hours as needed for pain.   Yes [provider]  albuterol (PROVENTIL HFA;VENTOLIN HFA) 108 (90 BASE) MCG/ACT inhaler Inhale 2 puffs into the lungs every 4 (four) hours as needed for wheezing.    Yes [provider]  albuterol (PROVENTIL) (2.5 MG/3ML) 0.083% nebulizer solution Take 2.5 mg by nebulization every 4 (four) hours as needed for wheezing or shortness of breath.   Yes [provider]    azithromycin (ZITHROMAX) 500 MG tablet Take 1 tablet (500 mg total) by mouth daily. 06/25/17 06/29/17 Yes Bonnielee Haff, MD  Cyanocobalamin (VITAMIN B-12) 2500 MCG SUBL Place 2,500 mcg under the tongue daily.   Yes [provider]  diltiazem (TIAZAC) 300 MG 24 hr capsule Take 300 mg by mouth daily.    Yes [provider]  furosemide (LASIX) 40 MG tablet Take 0.5 tablets (20 mg total) by mouth daily. 06/25/17  Yes Bonnielee Haff, MD  insulin aspart (NOVOLOG) 100 UNIT/ML FlexPen Check CBG's there etimes daily before meals. For CBG less than 150: 0 Units For CBG 150-200: 2 Units SQ For CBG 200-250: 4 Units SQ For CBG 250-300: 6 Units SQ For CBG 300-350: 8 Units SQ and call PCP for further instructions 06/25/17  Yes Bonnielee Haff, MD  insulin glargine (LANTUS) 100 unit/mL SOPN Inject 0.25 mLs (25 Units total) into the skin daily. 06/25/17  Yes Bonnielee Haff, MD  ipratropium-albuterol (DUONEB) 0.5-2.5 (3) MG/3ML SOLN Take 3 mLs by nebulization QID.    Yes [provider]  montelukast (SINGULAIR) 10 MG tablet Take 10 mg by mouth daily.   Yes [provider]  omeprazole (PRILOSEC) 40 MG capsule Take 40 mg by mouth daily.   Yes [provider]  potassium chloride (K-DUR) 10 MEQ tablet Take 1 tablet (10 mEq total) by mouth daily. 06/25/17  Yes Bonnielee Haff, MD  predniSONE (DELTASONE) 20 MG tablet Take 3 tablets twice daily for 3 days, then take 3 tablets once daily for 3 days, then take 2 tablets once daily for 3 days, then take 1 tablet once daily for 3 days, then STOP. 06/25/17  Yes Bonnielee Haff, MD  rosuvastatin (CRESTOR) 20 MG tablet Take 1 tablet (20 mg total) by mouth daily. 12/29/10  Yes Yoo, Doe-Hyun R, DO  apixaban (ELIQUIS) 2.5 MG TABS tablet Take 1 tablet (2.5 mg total) by mouth 2 (two) times daily. Patient not taking: Reported on 06/23/2017 04/24/17   Waldron Session, MD  Melatonin 3 MG TABS Take 1 tablet by mouth at bedtime.    [provider]  nicotine (NICODERM CQ - DOSED IN MG/24 HOURS) 14 mg/24hr patch Place 1 patch (14 mg total) onto the skin daily. Patient not taking: Reported on 06/23/2017 04/25/17   Waldron Session, MD  senna-docusate (SENOKOT-S) 8.6-50 MG tablet Take 1 tablet by mouth at bedtime as needed for mild constipation. Patient not taking: Reported on 06/23/2017 04/24/17   Waldron Session, MD    Family History Family History  Problem Relation Age of Onset  . Heart disease Brother        CAD  . Diabetes Brother   . COPD Brother   . Emphysema Brother     Social History Social History  Substance Use Topics  . Smoking status: Current Every Day Smoker    Types: Cigarettes  . Smokeless tobacco: Never Used  . Alcohol use No  Allergies   Procaine; Procaine hcl; and Atorvastatin   Review of Systems Review of Systems  Constitutional: Negative for appetite change, fatigue and fever.  HENT: Negative for congestion, ear discharge and sinus pressure.   Eyes: Negative for discharge.  Respiratory: Positive for shortness of breath. Negative for cough.   Cardiovascular: Negative for chest pain.  Gastrointestinal: Negative for abdominal pain and diarrhea.  Genitourinary: Negative for frequency and hematuria.  Musculoskeletal: Negative for back pain.  Skin: Negative for rash.  Neurological: Negative for seizures and headaches.  Psychiatric/Behavioral: Negative for hallucinations.     Physical Exam Updated Vital Signs BP 132/72   Pulse 70   Temp 98.1 F (36.7 C) (Oral)   Resp 19   Ht 5\' 5"  (1.651 m)   Wt 81.2 kg (179 lb)   SpO2 94%   BMI 29.79 kg/m   Physical Exam  Constitutional: He is oriented to person, place, and time. He appears well-developed.  HENT:  Head: Normocephalic.  Eyes: Conjunctivae and EOM are normal. No scleral icterus.  Neck: Neck supple. No thyromegaly present.  Cardiovascular: Normal rate and regular rhythm.  Exam reveals no gallop and no friction rub.   No murmur  heard. Pulmonary/Chest: No stridor. He has wheezes. He has no rales. He exhibits no tenderness.  Abdominal: He exhibits no distension. There is no tenderness. There is no rebound.  Musculoskeletal: Normal range of motion. He exhibits no edema.  Lymphadenopathy:    He has no cervical adenopathy.  Neurological: He is oriented to person, place, and time. He exhibits normal muscle tone. Coordination normal.  Skin: No rash noted. No erythema.  Psychiatric: He has a normal mood and affect. His behavior is normal.     ED Treatments / Results  Labs (all labs ordered are listed, but only abnormal results are displayed) Labs Reviewed  CBC WITH DIFFERENTIAL/PLATELET - Abnormal; Notable for the following:       Result Value   WBC 13.3 (*)    Hemoglobin 12.9 (*)    RDW 18.6 (*)    Platelets 126 (*)    Neutro Abs 11.9 (*)    All other components within normal limits  BASIC METABOLIC PANEL - Abnormal; Notable for the following:    Chloride 97 (*)    CO2 36 (*)    Glucose, Bld 270 (*)    Calcium 8.5 (*)    All other components within normal limits  HEPATIC FUNCTION PANEL - Abnormal; Notable for the following:    Total Protein 5.5 (*)    Albumin 2.8 (*)    AST 11 (*)    All other components within normal limits  CULTURE, BLOOD (ROUTINE X 2)  CULTURE, BLOOD (ROUTINE X 2)  TROPONIN I  BRAIN NATRIURETIC PEPTIDE  LACTIC ACID, PLASMA    EKG  EKG Interpretation None       Radiology Ct Chest W Contrast  Result Date: 06/28/2017 CLINICAL DATA:  76 year old male with history of head congestive heart failure. Increasing shortness of breath. EXAM: CT CHEST WITH CONTRAST TECHNIQUE: Multidetector CT imaging of the chest was performed during intravenous contrast administration. CONTRAST:  44mL ISOVUE-300 IOPAMIDOL (ISOVUE-300) INJECTION 61% COMPARISON:  No priors. FINDINGS: Cardiovascular: Heart size is normal. There is no significant pericardial fluid, thickening or pericardial calcification.  There is aortic atherosclerosis, as well as atherosclerosis of the great vessels of the mediastinum and the coronary arteries, including calcified atherosclerotic plaque in the the main, left anterior descending, left circumflex and right coronary arteries. Calcifications  of the aortic valve. Lipomatous hypertrophy of the interatrial septum. Mediastinum/Nodes: No pathologically enlarged mediastinal or hilar lymph nodes. Esophagus is unremarkable in appearance. No axillary lymphadenopathy. Lungs/Pleura: Diffuse bronchial wall thickening. Scattered areas of more severe thickening of the peribronchovascular interstitium with associated peribronchovascular micro and macronodularity. These findings are most severe in the inferior segment of the lingula and in the basal segments of the left lower lobe where there are confluent areas of nodularity and surrounding airspace consolidation. Mild centrilobular and paraseptal emphysema. Trace left pleural effusion lying dependently. Upper Abdomen: Aortic atherosclerosis. Musculoskeletal: There are no aggressive appearing lytic or blastic lesions noted in the visualized portions of the skeleton. IMPRESSION: 1. The appearance of the chest is most compatible with multilobar bronchopneumonia, potentially related to recent aspiration. At this time, there is extensive mucoid impaction and widespread airspace disease most evident in the left lower lobe and lingula. 2. Aortic atherosclerosis, in addition to left main and 3 vessel coronary artery disease. Please note that although the presence of coronary artery calcium documents the presence of coronary artery disease, the severity of this disease and any potential stenosis cannot be assessed on this non-gated CT examination. Assessment for potential risk factor modification, dietary therapy or pharmacologic therapy may be warranted, if clinically indicated. 3. There are calcifications of the aortic valve. Echocardiographic correlation  for evaluation of potential valvular dysfunction may be warranted if clinically indicated. 4. Mild centrilobular and paraseptal emphysema. 5. Lipomatous hypertrophy of the interatrial septum. This can be associated with arrhythmias and some individuals. Aortic Atherosclerosis (ICD10-I70.0) and Emphysema (ICD10-J43.9). Electronically Signed   By: Vinnie Langton M.D.   On: 06/28/2017 10:59   Dg Chest Portable 1 View  Result Date: 06/28/2017 CLINICAL DATA:  Shortness of Breath EXAM: PORTABLE CHEST 1 VIEW COMPARISON:  June 22, 2017 FINDINGS: There is patchy airspace consolidation in the left base with small left pleural effusion. Lungs elsewhere clear. Heart is upper normal in size with pulmonary vascularity within normal limits. No adenopathy. Prominence in the right paratracheal region is felt to represent great vessel prominence in this age group. No bone lesions are evident. IMPRESSION: Left lower lobe airspace consolidation, likely pneumonia, with small left pleural effusion. Lungs elsewhere clear. Heart upper normal in size. Followup PA and lateral chest radiographs recommended in 3-4 weeks following trial of antibiotic therapy to ensure resolution and exclude underlying malignancy. Electronically Signed   By: Lowella Grip III M.D.   On: 06/28/2017 08:53    Procedures Procedures (including critical care time)  Medications Ordered in ED Medications  vancomycin (VANCOCIN) IVPB 1000 mg/200 mL premix (not administered)  piperacillin-tazobactam (ZOSYN) IVPB 3.375 g (not administered)  methylPREDNISolone sodium succinate (SOLU-MEDROL) 125 mg/2 mL injection 125 mg (125 mg Intravenous Given 06/28/17 0913)  ipratropium-albuterol (DUONEB) 0.5-2.5 (3) MG/3ML nebulizer solution 3 mL (3 mLs Nebulization Given 06/28/17 0917)  albuterol (PROVENTIL) (2.5 MG/3ML) 0.083% nebulizer solution 2.5 mg (2.5 mg Nebulization Given 06/28/17 0917)  iopamidol (ISOVUE-300) 61 % injection 75 mL (75 mLs Intravenous  Contrast Given 06/28/17 1038)     Initial Impression / Assessment and Plan / ED Course  I have reviewed the triage vital signs and the nursing notes.  Pertinent labs & imaging results that were available during my care of the patient were reviewed by me and considered in my medical decision making (see chart for details).     Patient with pneumonia.  he will be admitted and treated with IV antibiotics  Final Clinical Impressions(s) / ED Diagnoses  Final diagnoses:  HCAP (healthcare-associated pneumonia)    New Prescriptions New Prescriptions   No medications on file     Milton Ferguson, MD 06/28/17 1210

## 2017-06-28 NOTE — ED Triage Notes (Signed)
Pt recently admitted for chf and reports has been sob since discharged on 10.21.  Denies any pain.

## 2017-06-28 NOTE — Progress Notes (Signed)
Notified Dr. Cathlean Sauer patient blood sugar was 535. Ordered to give patient 15 units of insulin one time dose.

## 2017-06-28 NOTE — ED Notes (Signed)
Pt back from ct. Phlebotomy aware. nad

## 2017-06-28 NOTE — ED Notes (Signed)
Pt taken to ct 

## 2017-06-29 ENCOUNTER — Inpatient Hospital Stay (HOSPITAL_COMMUNITY): Payer: Medicare PPO

## 2017-06-29 DIAGNOSIS — J9601 Acute respiratory failure with hypoxia: Secondary | ICD-10-CM

## 2017-06-29 DIAGNOSIS — J449 Chronic obstructive pulmonary disease, unspecified: Secondary | ICD-10-CM

## 2017-06-29 DIAGNOSIS — I10 Essential (primary) hypertension: Secondary | ICD-10-CM

## 2017-06-29 LAB — COMPREHENSIVE METABOLIC PANEL
ALBUMIN: 2.7 g/dL — AB (ref 3.5–5.0)
ALT: 18 U/L (ref 17–63)
AST: 14 U/L — AB (ref 15–41)
Alkaline Phosphatase: 62 U/L (ref 38–126)
Anion gap: 8 (ref 5–15)
BUN: 24 mg/dL — AB (ref 6–20)
CHLORIDE: 97 mmol/L — AB (ref 101–111)
CO2: 35 mmol/L — AB (ref 22–32)
CREATININE: 0.81 mg/dL (ref 0.61–1.24)
Calcium: 8.9 mg/dL (ref 8.9–10.3)
GFR calc Af Amer: >60 mL/min (ref >60)
GLUCOSE: 263 mg/dL — AB (ref 65–99)
POTASSIUM: 5.1 mmol/L (ref 3.5–5.1)
SODIUM: 140 mmol/L (ref 135–145)
Total Bilirubin: 0.6 mg/dL (ref 0.3–1.2)
Total Protein: 5.7 g/dL — ABNORMAL LOW (ref 6.5–8.1)

## 2017-06-29 LAB — CBC
HEMATOCRIT: 38.8 % — AB (ref 39.0–52.0)
Hemoglobin: 12.5 g/dL — ABNORMAL LOW (ref 13.0–17.0)
MCH: 26.7 pg (ref 26.0–34.0)
MCHC: 32.2 g/dL (ref 30.0–36.0)
MCV: 82.9 fL (ref 78.0–100.0)
Platelets: 174 10*3/uL (ref 150–400)
RBC: 4.68 MIL/uL (ref 4.22–5.81)
RDW: 18.7 % — ABNORMAL HIGH (ref 11.5–15.5)
WBC: 11.8 10*3/uL — AB (ref 4.0–10.5)

## 2017-06-29 LAB — GLUCOSE, CAPILLARY
GLUCOSE-CAPILLARY: 263 mg/dL — AB (ref 65–99)
GLUCOSE-CAPILLARY: 180 mg/dL — AB (ref 65–99)
Glucose-Capillary: 260 mg/dL — ABNORMAL HIGH (ref 65–99)
Glucose-Capillary: 261 mg/dL — ABNORMAL HIGH (ref 65–99)

## 2017-06-29 MED ORDER — ALPRAZOLAM 0.25 MG PO TABS
0.2500 mg | ORAL_TABLET | Freq: Three times a day (TID) | ORAL | Status: DC | PRN
  Administered 2017-06-29 – 2017-07-03 (×11): 0.25 mg via ORAL
  Filled 2017-06-29 (×11): qty 1

## 2017-06-29 MED ORDER — INSULIN GLARGINE 100 UNIT/ML ~~LOC~~ SOLN
20.0000 [IU] | Freq: Every day | SUBCUTANEOUS | Status: DC
  Administered 2017-06-30: 20 [IU] via SUBCUTANEOUS
  Filled 2017-06-29 (×4): qty 0.2

## 2017-06-29 MED ORDER — SODIUM CHLORIDE 3 % IN NEBU
4.0000 mL | INHALATION_SOLUTION | Freq: Two times a day (BID) | RESPIRATORY_TRACT | Status: AC
  Administered 2017-06-29 – 2017-07-01 (×3): 4 mL via RESPIRATORY_TRACT
  Filled 2017-06-29 (×3): qty 4

## 2017-06-29 MED ORDER — INSULIN GLARGINE 100 UNIT/ML ~~LOC~~ SOLN: 20.0000 [IU] | Freq: Every day | SUBCUTANEOUS | Status: DC

## 2017-06-29 MED ORDER — INSULIN GLARGINE 100 UNIT/ML ~~LOC~~ SOLN
20.0000 [IU] | Freq: Every day | SUBCUTANEOUS | Status: DC
  Filled 2017-06-29: qty 0.2

## 2017-06-29 NOTE — Progress Notes (Signed)
SLP Note  Patient Details Name: Andrew [name] MRN: [id number] DOB: 06/01/1941   Cancelled treatment:       Reason Eval/Treat Not Completed: Other (comment); Radiology received order for MBSS, however no SLP consult received. Pt's chart reviewed and SLP discussed with Dr. Cathlean Sauer. Pt known to SLP department due to history of dysphagia and recent hospitalizations. Pt had MBSS in February 2018, but has been hospitalized several times since then with PNA/COPD exacerbation. Pt with history of non-compliance at home. Recommend MBSS due to current PNA and h/o dysphagia. Will order and complete this date.  Thank you,  Genene Churn, [place]    [place] 06/29/2017, 1:59 PM

## 2017-06-29 NOTE — Progress Notes (Signed)
PROGRESS NOTE  I saw and evaluated the patient, I personally obtain the key portions of the history and physical exam, I reviewed the physician assistant student documentation and agree with the physician assistant student medical decision-making. Further assessment and plan are as follows:  Please see attending's daily progress note.  Andrew [name] M.D.   Andrew [name] QPY:[id number] DOB: 08/20/1941 DOA: 06/28/2017 PCP: [facility]   LOS: 1 day   Brief Narrative / Interim history: 76 year-old male with history of GERD, COPD and CHF who presented yesterday with SOB. Pt was hospitalized here from 06/23/17-06/25/17 for COPD exacerbation and was discharged home. He states that his breathing never did improve at that time. He was also hospitalized with respiratory failure requiring intubation in August. On presentation yesterday, pt had bibasilar crackles, LE edema and JVD. CXR with lower left lobe infiltrate that looks to be unchanged over the past couple of months as well as pulmonary vascular congestion. Pt was admitted for treatment of acute on chronic hypoxic respiratory failure 2/2 HCAP/aspiration PNA, and acute on chronic decompensated diastolic heart failure.      Assessment & Plan: Active Problems:   Pneumonia   Acute on chronic hypoxic respiratory failure 2/2 left lower lobe pneumonia. Pt with left lower lobe infiltrate, no fevers or cough and only mild leukocytosis. WBC this morning at 11.8, down from 13.3 yesterday. Likely represents aspiration PNA. Continuing IV Levaquin, not adding broader spectrum abx given suspicion for aspiration PNA vs. HCAP. Will recheck CBC in the morning to trend WBC. Blood cultures with no growth at 24 hours. Pt on aspiration precautions, will also get speech and swallow evaluation as well. Adding hypertonic saline nebulizers and chest PT to aid in clearing secretions.   Diastolic heart failure, EF 60-65%, acute on chronic, with cardiogenic  pulmonary edema. Fluid balance is -1740 ml today. LE edema still present, but much improved. Will continue diuresis with Lasix 40 mg PO BID. Continue daily weights/strict I&O. Will reassess CXR after diuresis.   COPD No wheezing or air trapping. Will continue with DuoNebs and holding steroids.  Anxiety May be contributing to respiratory difficulties. Will start alprazolam 1 mg TID PRN.   Paroxysmal atrial fibrillation Stable, pt in sinus rhythm. Continue diltiazem for rate control. No AC at home.  Diabetes mellitus, type 2 Continuing sliding scale insulin and glucose monitoring. Glucose has ranged from 204-535. Added 20 units Lantus QD, will continue to monitor glucose. Will titrate Lantus to target glucose less than 180.  Dyslipidemia Continuing rosuvastatin.   DVT prophylaxis: Lovenox Code Status: full code Family Communication: not present Disposition Plan: home when medically clear  Consultants:   Speech  PT  Procedures:     Antimicrobials:  Levaquin 500 mg QD  Subjective: Pt not feeling different from yesterday. Still "feels sick" and with SOB. Pt also reports anxiety.   Objective: Vitals:   06/29/17 0526 06/29/17 0528 06/29/17 0751 06/29/17 1140  BP: 122/71     Pulse: 70     Resp: 18     Temp: 98 F (36.7 C)     TempSrc: Oral     SpO2: 93%  94% 96%  Weight:  72.8 kg (160 lb 9.7 oz)    Height:        Intake/Output Summary (Last 24 hours) at 06/29/17 1145 Last data filed at 06/29/17 0940  Gross per 24 hour  Intake             1190 ml  Output  2900 ml  Net            -1710 ml   Filed Weights   06/28/17 0828 06/28/17 1457 06/29/17 0528  Weight: 81.2 kg (179 lb) 72.8 kg (160 lb 9.6 oz) 72.8 kg (160 lb 9.7 oz)    Examination:  Constitutional: ill-appeaing Eyes: lids and conjunctivae normal ENMT: Mucous membranes are moist.  Neck: normal, supple, no masses Respiratory: tachypnea, no accessory muscle use or retractions, diffuse  crackles. Cardiovascular: Regular rate and rhythm, no murmurs / rubs / gallops. 2+ pitting LE edema. Abdomen: no tenderness. Bowel sounds positive.  Musculoskeletal: no clubbing / cyanosis. No joint deformity upper and lower extremities. No contractures. Normal muscle tone.  Skin: no rashes, lesions, ulcers.  Neurologic: Grossly non-focal. A&Ox4 Psychiatric: Normal judgment and insight. Alert and oriented x 3. Normal mood.    Data Reviewed: I have independently reviewed following labs and imaging studies   CBC:  Recent Labs Lab 06/22/17 2011 06/23/17 0402 06/25/17 0606 06/28/17 0909 06/29/17 0432  WBC 8.4 8.9 11.1* 13.3* 11.8*  NEUTROABS 5.7  --   --  11.9*  --   HGB 12.1* 13.1 11.8* 12.9* 12.5*  HCT 39.6 43.1 38.0* 41.6 38.8*  MCV 87.0 88.0 85.2 85.4 82.9  PLT 178 188 189 126* 734   Basic Metabolic Panel:  Recent Labs Lab 06/23/17 0402 06/24/17 0631 06/25/17 0606 06/28/17 0909 06/29/17 0432  NA 141 140 140 139 140  K 4.4 4.0 3.8 4.2 5.1  CL 91* 90* 92* 97* 97*  CO2 37* 41* 40* 36* 35*  GLUCOSE 429* 352* 340* 270* 263*  BUN 11 19 24* 12 24*  CREATININE 0.93 0.80 0.72 0.67 0.81  CALCIUM 8.1* 8.6* 8.4* 8.5* 8.9   GFR: Estimated Creatinine Clearance: 65.4 mL/min (by C-G formula based on SCr of 0.81 mg/dL). Liver Function Tests:  Recent Labs Lab 06/22/17 2011 06/23/17 0402 06/28/17 0909 06/29/17 0432  AST 14* 14* 11* 14*  ALT 20 20 20 18   ALKPHOS 68 80 67 62  BILITOT 0.4 0.7 0.9 0.6  PROT 5.9* 6.4* 5.5* 5.7*  ALBUMIN 2.9* 3.1* 2.8* 2.7*   No results for input(s): LIPASE, AMYLASE in the last 168 hours. No results for input(s): AMMONIA in the last 168 hours. Coagulation Profile: No results for input(s): INR, PROTIME in the last 168 hours. Cardiac Enzymes:  Recent Labs Lab 06/22/17 2011 06/28/17 0909  TROPONINI <0.03 <0.03   BNP (last 3 results) No results for input(s): PROBNP in the last 8760 hours. HbA1C: No results for input(s): HGBA1C in the  last 72 hours. CBG:  Recent Labs Lab 06/25/17 0731 06/25/17 1132 06/28/17 1642 06/28/17 2115 06/29/17 0745  GLUCAP 291* 397* 535* 204* 260*   Lipid Profile: No results for input(s): CHOL, HDL, LDLCALC, TRIG, CHOLHDL, LDLDIRECT in the last 72 hours. Thyroid Function Tests: No results for input(s): TSH, T4TOTAL, FREET4, T3FREE, THYROIDAB in the last 72 hours. Anemia Panel: No results for input(s): VITAMINB12, FOLATE, FERRITIN, TIBC, IRON, RETICCTPCT in the last 72 hours. Urine analysis:    Component Value Date/Time   COLORURINE AMBER (A) 05/05/2013 1621   APPEARANCEUR CLEAR 05/05/2013 1621   LABSPEC 1.025 05/05/2013 1621   PHURINE 5.5 05/05/2013 1621   GLUCOSEU NEGATIVE 05/05/2013 1621   HGBUR NEGATIVE 05/05/2013 1621   HGBUR negative 03/31/2009 1514   BILIRUBINUR SMALL (A) 05/05/2013 1621   KETONESUR NEGATIVE 05/05/2013 1621   PROTEINUR NEGATIVE 05/05/2013 1621   UROBILINOGEN 1.0 05/05/2013 1621   NITRITE NEGATIVE 05/05/2013 1621  LEUKOCYTESUR NEGATIVE 05/05/2013 1621   Sepsis Labs: Invalid input(s): PROCALCITONIN, LACTICIDVEN  Recent Results (from the past 240 hour(s))  Blood culture (routine x 2)     Status: None (Preliminary result)   Collection Time: 06/28/17 10:51 AM  Result Value Ref Range Status   Specimen Description BLOOD  Final   Special Requests BLOOD  Final   Culture NO GROWTH < 24 HOURS  Final   Report Status PENDING  Incomplete  Blood culture (routine x 2)     Status: None (Preliminary result)   Collection Time: 06/28/17 10:53 AM  Result Value Ref Range Status   Specimen Description BLOOD RIGHT FOREARM  Final   Special Requests   Final    BOTTLES DRAWN AEROBIC AND ANAEROBIC Blood Culture adequate volume   Culture NO GROWTH < 24 HOURS  Final   Report Status PENDING  Incomplete      Radiology Studies: Dg Chest 1 View  Result Date: 06/29/2017 CLINICAL DATA:  Shortness of breath, atrial fib, CHF, COPD EXAM: CHEST 1 VIEW COMPARISON:  06/28/2017  FINDINGS: Vascular congestion. Heart is borderline in size. Left perihilar and lower lobe opacity again noted, unchanged. No effusions. IMPRESSION: Stable left lower lobe opacity concerning for pneumonia. Mild vascular congestion. Electronically Signed   By: Rolm Baptise M.D.   On: 06/29/2017 08:27   Ct Chest W Contrast  Result Date: 06/28/2017 CLINICAL DATA:  76 year old male with history of head congestive heart failure. Increasing shortness of breath. EXAM: CT CHEST WITH CONTRAST TECHNIQUE: Multidetector CT imaging of the chest was performed during intravenous contrast administration. CONTRAST:  37mL ISOVUE-300 IOPAMIDOL (ISOVUE-300) INJECTION 61% COMPARISON:  No priors. FINDINGS: Cardiovascular: Heart size is normal. There is no significant pericardial fluid, thickening or pericardial calcification. There is aortic atherosclerosis, as well as atherosclerosis of the great vessels of the mediastinum and the coronary arteries, including calcified atherosclerotic plaque in the the main, left anterior descending, left circumflex and right coronary arteries. Calcifications of the aortic valve. Lipomatous hypertrophy of the interatrial septum. Mediastinum/Nodes: No pathologically enlarged mediastinal or hilar lymph nodes. Esophagus is unremarkable in appearance. No axillary lymphadenopathy. Lungs/Pleura: Diffuse bronchial wall thickening. Scattered areas of more severe thickening of the peribronchovascular interstitium with associated peribronchovascular micro and macronodularity. These findings are most severe in the inferior segment of the lingula and in the basal segments of the left lower lobe where there are confluent areas of nodularity and surrounding airspace consolidation. Mild centrilobular and paraseptal emphysema. Trace left pleural effusion lying dependently. Upper Abdomen: Aortic atherosclerosis. Musculoskeletal: There are no aggressive appearing lytic or blastic lesions noted in the visualized  portions of the skeleton. IMPRESSION: 1. The appearance of the chest is most compatible with multilobar bronchopneumonia, potentially related to recent aspiration. At this time, there is extensive mucoid impaction and widespread airspace disease most evident in the left lower lobe and lingula. 2. Aortic atherosclerosis, in addition to left main and 3 vessel coronary artery disease. Please note that although the presence of coronary artery calcium documents the presence of coronary artery disease, the severity of this disease and any potential stenosis cannot be assessed on this non-gated CT examination. Assessment for potential risk factor modification, dietary therapy or pharmacologic therapy may be warranted, if clinically indicated. 3. There are calcifications of the aortic valve. Echocardiographic correlation for evaluation of potential valvular dysfunction may be warranted if clinically indicated. 4. Mild centrilobular and paraseptal emphysema. 5. Lipomatous hypertrophy of the interatrial septum. This can be associated with arrhythmias and some individuals.  Aortic Atherosclerosis (ICD10-I70.0) and Emphysema (ICD10-J43.9). Electronically Signed   By: Vinnie Langton M.D.   On: 06/28/2017 10:59   Dg Chest Portable 1 View  Result Date: 06/28/2017 CLINICAL DATA:  Shortness of Breath EXAM: PORTABLE CHEST 1 VIEW COMPARISON:  June 22, 2017 FINDINGS: There is patchy airspace consolidation in the left base with small left pleural effusion. Lungs elsewhere clear. Heart is upper normal in size with pulmonary vascularity within normal limits. No adenopathy. Prominence in the right paratracheal region is felt to represent great vessel prominence in this age group. No bone lesions are evident. IMPRESSION: Left lower lobe airspace consolidation, likely pneumonia, with small left pleural effusion. Lungs elsewhere clear. Heart upper normal in size. Followup PA and lateral chest radiographs recommended in 3-4 weeks  following trial of antibiotic therapy to ensure resolution and exclude underlying malignancy. Electronically Signed   By: Lowella Grip III M.D.   On: 06/28/2017 08:53     Scheduled Meds: . diltiazem  300 mg Oral Daily  . enoxaparin (LOVENOX) injection  40 mg Subcutaneous Q24H  . furosemide  40 mg Intravenous Q12H  . furosemide  20 mg Oral Daily  . Influenza vac split quadrivalent PF  0.5 mL Intramuscular Tomorrow-1000  . insulin aspart  0-15 Units Subcutaneous TID WC  . insulin glargine  15 Units Subcutaneous Daily  . ipratropium-albuterol  3 mL Nebulization Q6H  . montelukast  10 mg Oral Daily  . pantoprazole  40 mg Oral Daily  . rosuvastatin  20 mg Oral q1800  . sodium chloride flush  3 mL Intravenous Q12H  . vitamin B-12  2,500 mcg Oral Daily   Continuous Infusions: . sodium chloride    . levofloxacin (LEVAQUIN) IV Stopped (06/28/17 1821)       Time spent: 15 minutes    Audery Amel, PA-S 06/29/2017, 11:45 AM   @[email or web address] @

## 2017-06-29 NOTE — Progress Notes (Signed)
Modified Barium Swallow Progress Note  Patient Details  Name: Andrew [name] MRN: [id number] Date of Birth: [birth year]  Today's Date: 06/29/2017  Modified Barium Swallow completed.  Full report located under Chart Review in the Imaging Section.  Brief recommendations include the following:  Clinical Impression  Pt assessed with barium tinged thin, puree, regular, barium tablet, and nectar-thick liquids in the lateral position for MBSS. Pt visibly short of breath throughout the evaluation. Pt demonstrates mild oral phase dysphagia characterized by delayed oral transit with dry solids likely negatively impacted by shortness of breath; mild pharyngeal phase dysphagia characterized by premature spillage of liquids over base of tongue with swallow trigger at the level of the pyriforms for thin and nectar and decreased laryngeal vestibule closure resulting in penetration into laryngeal vestibule (variable trace to min amount) with thins and nectars during the swallow. Pt penetrated a greater amount when taking straw sips thin, nectars, and when taking pill with thin, however no aspiration was observed throughout the study. Esophageal sweep revealed adequate/timely transfer of barium tablet to stomach. Recommend D3/mech soft (encourage moist textures) and thin liquids and no straws. Pt should clear throat/cough periodically to ensure clearance of liquids from laryngeal vestibule and repeat/dry swallow. Pt would likely benefit from ongoing education regarding risks for aspiration and compensatory strategies.    Swallow Evaluation Recommendations       SLP Diet Recommendations: Dysphagia 3 (Mech soft) solids;Thin liquid   Liquid Administration via: Cup;No straw   Medication Administration: Whole meds with liquid (or whole with puree)   Supervision: Intermittent supervision to cue for compensatory strategies   Compensations: Slow rate (avoid talking during meals, pt already SOB)   Postural Changes:  Remain semi-upright after after feeds/meals (Comment);Seated upright at 90 degrees   Oral Care Recommendations: Oral care BID;Staff/trained caregiver to provide oral care   Other Recommendations: Clarify dietary restrictions   Thank you,  Genene Churn, [place]  [place] 06/29/2017,6:53 PM

## 2017-06-29 NOTE — Care Management Note (Signed)
Case Management Note  Patient Details  Name: Andrew [name] MRN: [id number] Date of Birth: 1941/07/10  Subjective/Objective:         Admitted with pneumonia. Pt recently discharged with same dx. Pt from home, lives with dtr. He is ind with ADL's. He uses RW with mobility. He has HH RN/PT with [facility]. They are aware he is admitted. Pt has aid paid for by the [place]. He has home oxygen and neb machine through [facility]. He wears 3lpm at baseline, currently on 5lpm. Pt plans to return home with resumption of [facility] services. Pt aware if he is placed he will be in his co-pay days. He says he does not want placement. He requests that ne not be sent home before he is better, "like they did last time".        Action/Plan: Anticipate pt will return home with resumption of [facility] services. CM has contacted [facility], pt's PCP Dr. [name]; social worker is Drue Stager (p) [phone] (ph) [phone] Ext [phone]. CM will cont to follow.    Expected Discharge Date:     07/01/2017             Expected Discharge Plan:  [facility]  In-House Referral:  NA  Discharge planning Services  CM Consult  Post Acute Care Choice:  Home Health Choice offered to:  Patient  DME Arranged:   (Has oxygen at home with [facility]) DME Agency:  [facility]  HH Arranged:  RN [place] Agency:  [facility] (States has had HH at home - cannot connfirm if still has - patient too SOB.  Need to address later)  Status of Service:  In process, will continue to follow  If discussed at Long Length of Stay Meetings, dates discussed:    Additional Comments:  Sherald Barge, RN 06/29/2017, 3:21 PM

## 2017-06-29 NOTE — Progress Notes (Signed)
PROGRESS NOTE    Andrew [name]  FIE:[id number] DOB: [birth year] DOA: 06/28/2017 PCP: [facility]    Brief Narrative:  This is a 76 year old male who presented with worsening dyspnea, to the point where he has became symptomatic with minimal efforts. This is his fourth hospitalization since August 2018, all admissions for decompensated respiratory failure. On initial physical examination, his respiratory distress, positive accessory muscle use, blood pressure 116/72, heart rate 86, respiratory 25, oxygen saturation 93% on 4 L nasal cannula. Dry mucous membranes, pale conjunctiva, lungs with diffuse rhonchi bilaterally and bibasilar rales, distant heart sounds, no S3 or S4 gallop, protuberant abdomen, significant pitting lower extremity edema. Sodium 139, potassium 4.2, chloride 97, bicarbonate 26, glucose 270, BUN 12, creatinine 0.67, white count 13.3, hemoglobin 12.9, hematocrit 41.6, platelets 126. Chest x-ray with left lower lobe infiltrate, confirmed by CT chest. Increased vascular congestion, suggestive of pulmonary edema.   Patient will be admitted to hospital with working diagnosis of acute on chronic hypoxic respiratory failure due to healthcare associated pneumonia/aspiration pneumonia/left lower lobe, complicated by decompensated diastolic heart failure, acute on chronic.    Assessment & Plan:   Active Problems:   Pneumonia  1. Acute on chronic hypoxic respiratory failure, due to aspiration pneumonia/ left lower lobe. Patient continue to complain of dyspnea, clinically has improved compared to admission. Will continue antibiotic therapy with levofloxacin IV, patient has remained afebrile, cultures have no growth. Will follow on swallow evaluation.   2. Diastolic heart failure, acute on chronic, complicated by cardiogenic pulmonary edema. Continue diuresis with furosemide intravenously, 40 mg twice daily, urine output over last 24 hours 2,200, with significant improvement in  lower extremity edema and decreased intensity of JVD. Follow up chest film personally reviewed noted persistent vascular congestion.   3. COPD. On aggressive bronchodilator therapy with DuoNeb, continue oxymetry monitoring and supplemental 02 per [place], no signs of acute exacerbation, will continue to hold on systemic steroids for now.  4. Paroxysmal atrial fibrillation. On diltiazem, HR controlled 60 to 70 sinus. Off anticoagulation.   5. Type 2 diabetes mellitus with hyperglycemia. Resumed on basal insulin with glargine 15 units, capillary glucose still in the 200's, will increase to 20 units of glargine and will continue insulin sliding scale for glucose cover and monitoring.   6. Dyslipidemia. On rosuvastatin  7. Infrarenal aortic aneurysm 6.1 cm diameter. Old records personally reviewed, currently patient has been found to be a high surgical risk, on expectant management.     DVT prophylaxis: enoxaparin  Code Status: full Family Communication: no family at the bedside Disposition Plan: home   Consultants:     Procedures:     Antimicrobials:       Subjective: Patient with persistent dyspnea, moderate in intensity, associated with dry cough and anxiety. Noted decreased lower extremity edema, no nausea or vomiting, no abdominal or chest pain.   Objective: Vitals:   06/29/17 0526 06/29/17 0528 06/29/17 0751 06/29/17 1140  BP: 122/71     Pulse: 70     Resp: 18     Temp: 98 F (36.7 C)     TempSrc: Oral     SpO2: 93%  94% 96%  Weight:  72.8 kg (160 lb 9.7 oz)    Height:        Intake/Output Summary (Last 24 hours) at 06/29/17 1142 Last data filed at 06/29/17 0940  Gross per 24 hour  Intake             1190 ml  Output             2900 ml  Net            -1710 ml   Filed Weights   06/28/17 0828 06/28/17 1457 06/29/17 0528  Weight: 81.2 kg (179 lb) 72.8 kg (160 lb 9.6 oz) 72.8 kg (160 lb 9.7 oz)    Examination:   General: deconditioned and ill looking  appearing Neurology: Awake and alert, non focal  E ENT: mild pallor, no icterus, oral mucosa moist Cardiovascular: Mild to moderate JVD. Distant S1-S2 present, rhythmic, no gallops, rubs, or murmurs. ++ pitting lower extremity edema. Pulmonary: decreased breath sounds bilaterally, no wheezing, but scattered rhonchi and bibasilar rales. Gastrointestinal. Abdomen protuberant, no organomegaly, non tender, no rebound or guarding Skin. No rashes Musculoskeletal: no joint deformities     Data Reviewed: I have personally reviewed following labs and imaging studies  CBC:  Recent Labs Lab 06/22/17 2011 06/23/17 0402 06/25/17 0606 06/28/17 0909 06/29/17 0432  WBC 8.4 8.9 11.1* 13.3* 11.8*  NEUTROABS 5.7  --   --  11.9*  --   HGB 12.1* 13.1 11.8* 12.9* 12.5*  HCT 39.6 43.1 38.0* 41.6 38.8*  MCV 87.0 88.0 85.2 85.4 82.9  PLT 178 188 189 126* 376   Basic Metabolic Panel:  Recent Labs Lab 06/23/17 0402 06/24/17 0631 06/25/17 0606 06/28/17 0909 06/29/17 0432  NA 141 140 140 139 140  K 4.4 4.0 3.8 4.2 5.1  CL 91* 90* 92* 97* 97*  CO2 37* 41* 40* 36* 35*  GLUCOSE 429* 352* 340* 270* 263*  BUN 11 19 24* 12 24*  CREATININE 0.93 0.80 0.72 0.67 0.81  CALCIUM 8.1* 8.6* 8.4* 8.5* 8.9   GFR: Estimated Creatinine Clearance: 65.4 mL/min (by C-G formula based on SCr of 0.81 mg/dL). Liver Function Tests:  Recent Labs Lab 06/22/17 2011 06/23/17 0402 06/28/17 0909 06/29/17 0432  AST 14* 14* 11* 14*  ALT 20 20 20 18   ALKPHOS 68 80 67 62  BILITOT 0.4 0.7 0.9 0.6  PROT 5.9* 6.4* 5.5* 5.7*  ALBUMIN 2.9* 3.1* 2.8* 2.7*   No results for input(s): LIPASE, AMYLASE in the last 168 hours. No results for input(s): AMMONIA in the last 168 hours. Coagulation Profile: No results for input(s): INR, PROTIME in the last 168 hours. Cardiac Enzymes:  Recent Labs Lab 06/22/17 2011 06/28/17 0909  TROPONINI <0.03 <0.03   BNP (last 3 results) No results for input(s): PROBNP in the last 8760  hours. HbA1C: No results for input(s): HGBA1C in the last 72 hours. CBG:  Recent Labs Lab 06/25/17 0731 06/25/17 1132 06/28/17 1642 06/28/17 2115 06/29/17 0745  GLUCAP 291* 397* 535* 204* 260*   Lipid Profile: No results for input(s): CHOL, HDL, LDLCALC, TRIG, CHOLHDL, LDLDIRECT in the last 72 hours. Thyroid Function Tests: No results for input(s): TSH, T4TOTAL, FREET4, T3FREE, THYROIDAB in the last 72 hours. Anemia Panel: No results for input(s): VITAMINB12, FOLATE, FERRITIN, TIBC, IRON, RETICCTPCT in the last 72 hours.    Radiology Studies: I have reviewed all of the imaging during this hospital visit personally     Scheduled Meds: . diltiazem  300 mg Oral Daily  . enoxaparin (LOVENOX) injection  40 mg Subcutaneous Q24H  . furosemide  40 mg Intravenous Q12H  . furosemide  20 mg Oral Daily  . Influenza vac split quadrivalent PF  0.5 mL Intramuscular Tomorrow-1000  . insulin aspart  0-15 Units Subcutaneous TID WC  . insulin glargine  15 Units Subcutaneous  Daily  . ipratropium-albuterol  3 mL Nebulization Q6H  . montelukast  10 mg Oral Daily  . pantoprazole  40 mg Oral Daily  . rosuvastatin  20 mg Oral q1800  . sodium chloride flush  3 mL Intravenous Q12H  . vitamin B-12  2,500 mcg Oral Daily   Continuous Infusions: . sodium chloride    . levofloxacin (LEVAQUIN) IV Stopped (06/28/17 1821)     LOS: 1 day        Aysia Lowder Gerome Apley, MD [facility] Pager [phone]

## 2017-06-30 DIAGNOSIS — J42 Unspecified chronic bronchitis: Secondary | ICD-10-CM

## 2017-06-30 LAB — BASIC METABOLIC PANEL
Anion gap: 7 (ref 5–15)
BUN: 27 mg/dL — ABNORMAL HIGH (ref 6–20)
CALCIUM: 8.6 mg/dL — AB (ref 8.9–10.3)
CO2: 38 mmol/L — AB (ref 22–32)
CREATININE: 0.84 mg/dL (ref 0.61–1.24)
Chloride: 93 mmol/L — ABNORMAL LOW (ref 101–111)
GFR calc non Af Amer: >60 mL/min (ref >60)
GLUCOSE: 243 mg/dL — AB (ref 65–99)
Potassium: 4 mmol/L (ref 3.5–5.1)
Sodium: 138 mmol/L (ref 135–145)

## 2017-06-30 LAB — GLUCOSE, CAPILLARY
GLUCOSE-CAPILLARY: 215 mg/dL — AB (ref 65–99)
GLUCOSE-CAPILLARY: 264 mg/dL — AB (ref 65–99)
GLUCOSE-CAPILLARY: 135 mg/dL — AB (ref 65–99)
GLUCOSE-CAPILLARY: 318 mg/dL — AB (ref 65–99)

## 2017-06-30 LAB — CBC WITH DIFFERENTIAL/PLATELET
BASOS ABS: 0 10*3/uL (ref 0.0–0.1)
Basophils Relative: 0 %
EOS ABS: 0 10*3/uL (ref 0.0–0.7)
Eosinophils Relative: 0 %
HCT: 40.4 % (ref 39.0–52.0)
HEMOGLOBIN: 12.4 g/dL — AB (ref 13.0–17.0)
LYMPHS ABS: 1.3 10*3/uL (ref 0.7–4.0)
Lymphocytes Relative: 13 %
MCH: 26.2 pg (ref 26.0–34.0)
MCHC: 30.7 g/dL (ref 30.0–36.0)
MCV: 85.4 fL (ref 78.0–100.0)
Monocytes Absolute: 0.6 10*3/uL (ref 0.1–1.0)
Monocytes Relative: 6 %
Neutro Abs: 7.9 10*3/uL — ABNORMAL HIGH (ref 1.7–7.7)
Neutrophils Relative %: 81 %
PLATELETS: 159 10*3/uL (ref 150–400)
RBC: 4.73 MIL/uL (ref 4.22–5.81)
RDW: 18.5 % — ABNORMAL HIGH (ref 11.5–15.5)
WBC: 9.8 10*3/uL (ref 4.0–10.5)

## 2017-06-30 MED ORDER — FUROSEMIDE 40 MG PO TABS
40.0000 mg | ORAL_TABLET | Freq: Every day | ORAL | Status: DC
  Administered 2017-07-01 – 2017-07-03 (×3): 40 mg via ORAL
  Filled 2017-06-30 (×3): qty 1

## 2017-06-30 MED ORDER — IBUPROFEN 400 MG PO TABS
200.0000 mg | ORAL_TABLET | Freq: Four times a day (QID) | ORAL | Status: DC | PRN
  Administered 2017-06-30: 200 mg via ORAL
  Filled 2017-06-30 (×2): qty 1

## 2017-06-30 NOTE — Progress Notes (Signed)
Daughter at bedside.  Request that all D/C Prescriptions be faxed to Dr. Romero Liner at the [facility]: Fax# [phone].  Daughter wants to know if her dad is "improving".Marland KitchenMarland KitchenMarland KitchenRelayed to her, the latest Vital signs and Lab results.  He remains non-compliant with the use of straws.  He also states that he continues to smoke and that the "family" are the ones that provide the cigarettes.  Will continue to monitor.

## 2017-06-30 NOTE — Progress Notes (Signed)
ST recommended that pt remain on Dysphagia diet without the use of straws.  He remains non-compliant and demands straws with liquids.  Educated Pt on the dangers of Aspiration Pneumonia with MD present in room whom educated patient also.  Pt continues to refuse to be compliant. Will continue to monitor

## 2017-06-30 NOTE — Evaluation (Signed)
Physical Therapy Evaluation Patient Details Name: Andrew [name] MRN: [id number] DOB: 1940/11/14 Today's Date: 06/30/2017   History of Present Illness  Andrew [name] is a 76 y.o. male with medical history significant of chronic hypoxic respiratory failure on 3 L per min of supplemental oxygen at home. He complains of worsening dyspnea for last 48 hours, progressive and severe to the point where he has symptoms with minimal efforts, associated with lower extremity edema but no significant cough, chest pain or PND. No fevers or chills. No improving or worsening factors, refractive to supplemental oxygen per nasal cannula and use of bronchodilator therapy at home per nebulizer. Patient complains of persistent dyspnea since August 2018, after he was released from hospital, treated for COPD exacerbation, and required invasive mechanical ventilation.     Clinical Impression  Andrew [name] is a 76 y.o. male presenting with decreased cardiopulmonary endurance and decreased activity tolerance. He is currently functioning below his baseline of independent to mobilize in his home with a rollator and O2 supplementation. He currently requires minimum assist with a gait belt and front wheeled walker to ambulate short distances and is O2 via nasal cannula at 4L/min (SpO2 92-5%). Andrew [name] will benefit from ongoing PT for functional transfer training, gait training, and energy conservation education. Acute PT will follow throughout duration of stay.    Follow Up Recommendations Home health PT;Supervision - Intermittent    Equipment Recommendations   (patient has all necessary equipment)    Recommendations for Other Services       Precautions / Restrictions Precautions Precautions: Fall Restrictions Weight Bearing Restrictions: No      Mobility    Transfers Overall transfer level: Needs assistance Equipment used: Rolling walker (2 wheeled) Transfers: Sit to/from Stand Sit to Stand: Min assist       General transfer comment: verbal/tactile cues for sequencing transfer  Ambulation/Gait Ambulation/Gait assistance: Min assist Ambulation Distance (Feet): 8 Feet Assistive device: Rolling walker (2 wheeled) Gait Pattern/deviations: Step-through pattern;Trunk flexed;Decreased stride length;Narrow base of support   Gait velocity interpretation: Below normal speed for age/gender General Gait Details: 2 bouts x 8 feet of gait with front wheeled walker, patient required extended seated rest break bewteen bouts due to poor cardiopulmonary endurance. Patient needs cues to manage front wheeled walker and negotiate obstacles. Cues given for energy conservation and pursed lip breathing techniques during gait.     Balance Overall balance assessment: Needs assistance Sitting-balance support: Feet supported;Bilateral upper extremity supported Sitting balance-Leahy Scale: Fair     Standing balance support: Bilateral upper extremity supported Standing balance-Leahy Scale: Fair Standing balance comment: patient is reliant on BUE to seady durign static and dynamic standing        Pertinent Vitals/Pain Pain Assessment: No/denies pain    Home Living Family/patient expects to be discharged to:: Private residence Living Arrangements: Children (daughter) Available Help at Discharge: Family Type of Home: House Home Access: Ramped entrance     Home Layout: One level Home Equipment: Tub bench;Wheelchair - [employer] - 4 wheels (rollator)      Prior Function Level of Independence: Independent with assistive device(s)   Comments: patient no longer driving 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, uses a rollator for mobility in his home primarily        Extremity/Trunk Assessment   Upper Extremity Assessment Upper Extremity Assessment: Overall WFL for tasks assessed    Lower Extremity Assessment Lower Extremity Assessment: Overall WFL for tasks assessed    Cervical / Trunk  Assessment Cervical /  Trunk Assessment: Kyphotic  Communication   Communication: No difficulties;HOH (patient has hearing aids in room)  Cognition Arousal/Alertness: Awake/alert Behavior During Therapy: WFL for tasks assessed/performed Overall Cognitive Status: Within Functional Limits for tasks assessed    General Comments: patietnt oriented x 4          Assessment/Plan    PT Assessment Patient needs continued PT services  PT Problem List Decreased activity tolerance;Decreased mobility;Cardiopulmonary status limiting activity;Decreased knowledge of use of DME;Decreased safety awareness;Decreased balance       PT Treatment Interventions Gait training;DME instruction;Patient/family education;Functional mobility training;Therapeutic activities;Therapeutic exercise;Balance training;Wheelchair mobility training    PT Goals (Current goals can be found in the Care Plan section)  Acute Rehab PT Goals Patient Stated Goal: I want to go back home PT Goal Formulation: With patient Time For Goal Achievement: 07/03/17 Potential to Achieve Goals: Fair    Frequency Min 3X/week   Barriers to discharge           AM-PAC PT "6 Clicks" Daily Activity  Outcome Measure Difficulty turning over in bed (including adjusting bedclothes, sheets and blankets)?: Unable Difficulty moving from lying on back to sitting on the side of the bed? : Unable Difficulty sitting down on and standing up from a chair with arms (e.g., wheelchair, bedside commode, etc,.)?: Unable Help needed moving to and from a bed to chair (including a wheelchair)?: A Little Help needed walking in hospital room?: A Little Help needed climbing 3-5 steps with a railing? : Total 6 Click Score: 10    End of Session Equipment Utilized During Treatment: Gait belt;Oxygen (front wheeled walker) Activity Tolerance: Patient limited by fatigue Patient left: in chair;with call bell/phone within reach (with table and lunch tray in front of  him) Nurse Communication: Mobility status;Other (comment) (patient requesting iced tea for lunch, notified of patient location/position) PT Visit Diagnosis: Difficulty in walking, not elsewhere classified (R26.2);Other abnormalities of gait and mobility (R26.89);Unsteadiness on feet (R26.81)    Time: 1120-1205 PT Time Calculation (min) (ACUTE ONLY): 45 min   Charges:   PT Evaluation $PT Eval Moderate Complexity: 1 Mod PT Treatments $Gait Training: 8-22 mins $Therapeutic Activity: 23-37 mins   PT G Codes:   PT G-Codes **NOT FOR INPATIENT CLASS** Functional Assessment Tool Used: AM-PAC 6 Clicks Basic Mobility Functional Limitation: Mobility: Walking and moving around;Changing and maintaining body position Mobility: Walking and Moving Around Current Status [phone]): At least 60 percent but less than 80 percent impaired, limited or restricted Mobility: Walking and Moving Around Goal Status [phone]): At least 60 percent but less than 80 percent impaired, limited or restricted Mobility: Walking and Moving Around Discharge Status [phone]): At least 60 percent but less than 80 percent impaired, limited or restricted Changing and Maintaining Body Position Current Status ([id number]): At least 60 percent but less than 80 percent impaired, limited or restricted Changing and Maintaining Body Position Goal Status ([id number]): At least 60 percent but less than 80 percent impaired, limited or restricted Changing and Maintaining Body Position Discharge Status [phone]): At least 60 percent but less than 80 percent impaired, limited or restricted     Debara Pickett, PT, DPT Physical Therapist with [facility]  06/30/2017 12:59 PM

## 2017-06-30 NOTE — Progress Notes (Signed)
PROGRESS NOTE    Andrew [name]  ZOX:[id number] DOB: 1941-01-22 DOA: 06/28/2017 PCP: [facility]    Brief Narrative:  This is a 76 year old male who presented with worsening dyspnea, to the point where he has became symptomatic with minimal efforts. This is his fourth hospitalization since August 2018, alladmissions for decompensated respiratory failure. On initial physical examination, his respiratory distress, positive accessory muscle use, blood pressure 116/72, heart rate 86, respiratory 25, oxygen saturation 93% on 4 L nasal cannula. Dry mucous membranes, pale conjunctiva, lungs with diffuse rhonchi bilaterally and bibasilar rales, distant heart sounds, no S3 or S4 gallop, protuberant abdomen, significant pitting lower extremity edema. Sodium 139, potassium 4.2, chloride 97, bicarbonate 26, glucose 270, BUN 12, creatinine 0.67, white count 13.3, hemoglobin 12.9, hematocrit 41.6, platelets 126.Chest x-ray with left lower lobe infiltrate, confirmed by CT chest. Increased vascular congestion, suggestive of pulmonary edema.   Patient will be admitted to hospital with working diagnosis of acute on chronic hypoxic respiratory failure due to healthcare associated pneumonia/aspiration pneumonia/left lower lobe, complicated by decompensated diastolic heart failure, acute on chronic.    Assessment & Plan:   Active Problems:   Pneumonia  1. Acute on chronic hypoxic respiratory failure, due to aspiration pneumonia/ left lower lobe. Clinically dyspnea has improved, positive aspiration per swallow evaluation and diet has been modified. Patient not compliant with aspiration precautions. Will continue antibiotic therapy with levofloxacin, bronchodilators, hypertonic saline nebs and oxygen supplementation. Out of bed to chair and physical therapy evaluation.   2. Diastolic heart failure, acute on chronic, complicated by cardiogenic pulmonary edema. Patient has responded well to diuresis,  negative fluid balance, with urine output up to 3,175 over last 24 hours. Will change furosemide to po. Continue telemetry monitoring and blood pressure control.   3. COPD. With no signs of exacerbation. Continue bronchodilator therapy, oxymetry monitoring and supplemental 02 per [place].   4. Paroxysmal atrial fibrillation.Continue diltiazem, patient on sinus rhythm. No anticoagulation.   5. Type 2 diabetes mellitus with hyperglycemia. Will continue basal insulin regimen with 20 units of glargine, plus insulin sliding scale for glucose cover and monitoring. Capillary glucose 263, 261, 180, 215, 264.   6. Dyslipidemia. Continue rosuvastatin  7. Infrarenal aortic aneurysm 6.1 cm diameter. Stable with no signs of decompensation, on expectant management.  Follow up as outpatient.      DVT prophylaxis: enoxaparin  Code Status: full Family Communication: no family at the bedside Disposition Plan: home   Consultants:     Procedures:     Antimicrobials: Levofloxacin  Subjective: Patient was found to have aspiration, placed on a dysphagia diet that he is refusing, no nausea or vomiting, improved dyspnea, no cough, no fever or chills. Positive back pain.   Objective: Vitals:   06/30/17 0145 06/30/17 0649 06/30/17 0756 06/30/17 0834  BP:  (!) 106/58  (!) 106/58  Pulse:  84    Resp:  20    Temp:  98 F (36.7 C)    TempSrc:  Oral    SpO2: 94% 95% 95%   Weight:  72.8 kg (160 lb 7.6 oz)    Height:        Intake/Output Summary (Last 24 hours) at 06/30/17 0916 Last data filed at 06/30/17 0600  Gross per 24 hour  Intake                3 ml  Output             3175 ml  Net            -  3172 ml   Filed Weights   06/28/17 1457 06/29/17 0528 06/30/17 0649  Weight: 72.8 kg (160 lb 9.6 oz) 72.8 kg (160 lb 9.7 oz) 72.8 kg (160 lb 7.6 oz)    Examination:   General: deconditioned.  Neurology: Awake and alert, non focal  E ENT: mild pallor, no icterus, oral mucosa  moist Cardiovascular: No JVD. S1-S2 present, rhythmic, no gallops, rubs, or murmurs. Trace lower extremity edema. Pulmonary: decreased breath sounds bilaterally, no wheezing, rhonchi, but scattered rales. Decreased ventilation.  Gastrointestinal. Abdomen flat, no organomegaly, non tender, no rebound or guarding Skin. No rashes Musculoskeletal: no joint deformities     Data Reviewed: I have personally reviewed following labs and imaging studies  CBC:  Recent Labs Lab 06/25/17 0606 06/28/17 0909 06/29/17 0432 06/30/17 0429  WBC 11.1* 13.3* 11.8* 9.8  NEUTROABS  --  11.9*  --  7.9*  HGB 11.8* 12.9* 12.5* 12.4*  HCT 38.0* 41.6 38.8* 40.4  MCV 85.2 85.4 82.9 85.4  PLT 189 126* 174 607   Basic Metabolic Panel:  Recent Labs Lab 06/24/17 0631 06/25/17 0606 06/28/17 0909 06/29/17 0432 06/30/17 0429  NA 140 140 139 140 138  K 4.0 3.8 4.2 5.1 4.0  CL 90* 92* 97* 97* 93*  CO2 41* 40* 36* 35* 38*  GLUCOSE 352* 340* 270* 263* 243*  BUN 19 24* 12 24* 27*  CREATININE 0.80 0.72 0.67 0.81 0.84  CALCIUM 8.6* 8.4* 8.5* 8.9 8.6*   GFR: Estimated Creatinine Clearance: 63 mL/min (by C-G formula based on SCr of 0.84 mg/dL). Liver Function Tests:  Recent Labs Lab 06/28/17 0909 06/29/17 0432  AST 11* 14*  ALT 20 18  ALKPHOS 67 62  BILITOT 0.9 0.6  PROT 5.5* 5.7*  ALBUMIN 2.8* 2.7*   No results for input(s): LIPASE, AMYLASE in the last 168 hours. No results for input(s): AMMONIA in the last 168 hours. Coagulation Profile: No results for input(s): INR, PROTIME in the last 168 hours. Cardiac Enzymes:  Recent Labs Lab 06/28/17 0909  TROPONINI <0.03   BNP (last 3 results) No results for input(s): PROBNP in the last 8760 hours. HbA1C: No results for input(s): HGBA1C in the last 72 hours. CBG:  Recent Labs Lab 06/29/17 0745 06/29/17 1213 06/29/17 1713 06/29/17 2125 06/30/17 0734  GLUCAP 260* 263* 261* 180* 215*   Lipid Profile: No results for input(s): CHOL, HDL,  LDLCALC, TRIG, CHOLHDL, LDLDIRECT in the last 72 hours. Thyroid Function Tests: No results for input(s): TSH, T4TOTAL, FREET4, T3FREE, THYROIDAB in the last 72 hours. Anemia Panel: No results for input(s): VITAMINB12, FOLATE, FERRITIN, TIBC, IRON, RETICCTPCT in the last 72 hours.    Radiology Studies: I have reviewed all of the imaging during this hospital visit personally     Scheduled Meds: . diltiazem  300 mg Oral Daily  . enoxaparin (LOVENOX) injection  40 mg Subcutaneous Q24H  . furosemide  40 mg Intravenous Q12H  . Influenza vac split quadrivalent PF  0.5 mL Intramuscular Tomorrow-1000  . insulin aspart  0-15 Units Subcutaneous TID WC  . insulin glargine  20 Units Subcutaneous Daily  . ipratropium-albuterol  3 mL Nebulization Q6H  . montelukast  10 mg Oral Daily  . pantoprazole  40 mg Oral Daily  . rosuvastatin  20 mg Oral q1800  . sodium chloride flush  3 mL Intravenous Q12H  . sodium chloride HYPERTONIC  4 mL Nebulization BID  . vitamin B-12  2,500 mcg Oral Daily   Continuous Infusions: . sodium chloride    .  levofloxacin (LEVAQUIN) IV Stopped (06/29/17 1713)     LOS: 2 days        Marcianna Daily Gerome Apley, MD [facility] Pager [phone]

## 2017-06-30 NOTE — Care Management Note (Signed)
Case Management Note  Patient Details  Name: Andrew [name] MRN: [id number] Date of Birth: 1941/03/16  Expected Discharge Date:     07/02/2017             Expected Discharge Plan:  [name]  In-House Referral:  NA  Discharge planning Services  CM Consult  Post Acute Care Choice:  Home Health Choice offered to:  Patient  DME Arranged:   (Has oxygen at home with [facility]) DME Agency:  [facility]  HH Arranged:  RN, PT [place] Agency:  [facility] (States has had HH at home - cannot connfirm if still has - patient too SOB.  Need to address later)  Status of Service:  Completed, signed off  Additional Comments: Potential for DC home over weekend. [facility] rep made aware, HH resumption order has been put in. Pt aware HH has 48 hrs to resume services. Pt with 4 admission in past 6 months. Question is palliative consult would be appropriate to discuss disease progression and manage expectation of pt and family. CM will fax DC information to [place] after DC.   Sherald Barge, RN 06/30/2017, 12:49 PM

## 2017-06-30 NOTE — Progress Notes (Signed)
Inpatient Diabetes Program Recommendations  AACE/ADA: New Consensus Statement on Inpatient Glycemic Control (2015)  Target Ranges:  Prepandial:   less than 140 mg/dL      Peak postprandial:   less than 180 mg/dL (1-2 hours)      Critically ill patients:  140 - 180 mg/dL   Lab Results  Component Value Date   GLUCAP 215 (H) 06/30/2017   HGBA1C 8.8 (H) 06/23/2017    Review of Glycemic Control  Inpatient Diabetes Program Recommendations:    Lantus 15 units given yesterday. Lantus 20 units ordered for today. Will watch trends on increased dose.  Thanks,  Tama Headings RN, MSN, [facility] Inpatient Diabetes Coordinator Team Pager [phone] (8a-5p)

## 2017-06-30 NOTE — Care Management Important Message (Signed)
Important Message  Patient Details  Name: Andrew [name] MRN: [id number] Date of Birth: 11/05/1940   Medicare Important Message Given:  Yes    Sherald Barge, RN 06/30/2017, 12:49 PM

## 2017-06-30 NOTE — Progress Notes (Signed)
Late entry  Explained SLP recommendations regarding swallowing to pt. Pt states "I'm not gonna do any of that, I'm gonna do what feels good to me, nobody here knows anything", advised that pt follow MD and SLP recommendations in order to fully recover, pt continues to argue and refuse to comply. Will continue to monitor.

## 2017-07-01 DIAGNOSIS — I5033 Acute on chronic diastolic (congestive) heart failure: Secondary | ICD-10-CM

## 2017-07-01 LAB — GLUCOSE, CAPILLARY
GLUCOSE-CAPILLARY: 302 mg/dL — AB (ref 65–99)
Glucose-Capillary: 158 mg/dL — ABNORMAL HIGH (ref 65–99)
Glucose-Capillary: 248 mg/dL — ABNORMAL HIGH (ref 65–99)
Glucose-Capillary: 165 mg/dL — ABNORMAL HIGH (ref 65–99)

## 2017-07-01 LAB — CBC WITH DIFFERENTIAL/PLATELET
Basophils Absolute: 0 10*3/uL (ref 0.0–0.1)
Basophils Relative: 0 %
Eosinophils Absolute: 0.1 10*3/uL (ref 0.0–0.7)
Eosinophils Relative: 2 %
HEMATOCRIT: 40.5 % (ref 39.0–52.0)
HEMOGLOBIN: 12.8 g/dL — AB (ref 13.0–17.0)
LYMPHS ABS: 1.1 10*3/uL (ref 0.7–4.0)
Lymphocytes Relative: 17 %
MCH: 26.6 pg (ref 26.0–34.0)
MCHC: 31.6 g/dL (ref 30.0–36.0)
MCV: 84.2 fL (ref 78.0–100.0)
MONO ABS: 0.7 10*3/uL (ref 0.1–1.0)
MONOS PCT: 11 %
NEUTROS ABS: 4.3 10*3/uL (ref 1.7–7.7)
NEUTROS PCT: 70 %
Platelets: 160 10*3/uL (ref 150–400)
RBC: 4.81 MIL/uL (ref 4.22–5.81)
RDW: 18 % — AB (ref 11.5–15.5)
WBC: 6.1 10*3/uL (ref 4.0–10.5)

## 2017-07-01 LAB — BASIC METABOLIC PANEL
Anion gap: 7 (ref 5–15)
BUN: 18 mg/dL (ref 6–20)
CALCIUM: 8.6 mg/dL — AB (ref 8.9–10.3)
CHLORIDE: 96 mmol/L — AB (ref 101–111)
CO2: 36 mmol/L — AB (ref 22–32)
CREATININE: 0.74 mg/dL (ref 0.61–1.24)
GFR calc Af Amer: >60 mL/min (ref >60)
GFR calc non Af Amer: >60 mL/min (ref >60)
GLUCOSE: 249 mg/dL — AB (ref 65–99)
Potassium: 3.7 mmol/L (ref 3.5–5.1)
Sodium: 139 mmol/L (ref 135–145)

## 2017-07-01 MED ORDER — KETOROLAC TROMETHAMINE 30 MG/ML IJ SOLN
30.0000 mg | Freq: Once | INTRAMUSCULAR | Status: AC
  Administered 2017-07-01: 30 mg via INTRAVENOUS
  Filled 2017-07-01: qty 1

## 2017-07-01 MED ORDER — INSULIN GLARGINE 100 UNIT/ML ~~LOC~~ SOLN
25.0000 [IU] | Freq: Every day | SUBCUTANEOUS | Status: DC
  Administered 2017-07-01 – 2017-07-02 (×2): 25 [IU] via SUBCUTANEOUS
  Filled 2017-07-01 (×3): qty 0.25

## 2017-07-01 MED ORDER — METHYLPREDNISOLONE SODIUM SUCC 40 MG IJ SOLR
20.0000 mg | Freq: Three times a day (TID) | INTRAMUSCULAR | Status: DC
  Administered 2017-07-01 – 2017-07-02 (×3): 20 mg via INTRAVENOUS
  Filled 2017-07-01 (×3): qty 1

## 2017-07-01 NOTE — Progress Notes (Signed)
PROGRESS NOTE    RAY GERVASI  FIE:[id number] DOB: [birth year] DOA: 06/28/2017 PCP: [facility]    Brief Narrative:  This is a 76 year old male who presented with worsening dyspnea, to the point where he has became symptomatic with minimal efforts. This is his fourth hospitalization since August 2018, alladmissions for decompensated respiratory failure. On initial physical examination, his respiratory distress, positive accessory muscle use, blood pressure 116/72, heart rate 86, respiratory 25, oxygen saturation 93% on 4 L nasal cannula. Dry mucous membranes, pale conjunctiva, lungs with diffuse rhonchi bilaterally and bibasilar rales, distant heart sounds, no S3 or S4 gallop, protuberant abdomen, significant pitting lower extremity edema. Sodium 139, potassium 4.2, chloride 97, bicarbonate 26, glucose 270, BUN 12, creatinine 0.67, white count 13.3, hemoglobin 12.9, hematocrit 41.6, platelets 126.Chest x-ray with left lower lobe infiltrate, confirmed by CT chest. Increased vascular congestion, suggestive of pulmonary edema.   Patient will be admitted to hospital with working diagnosis of acute on chronic hypoxic respiratory failure due to healthcare associated pneumonia/aspiration pneumonia/left lower lobe, complicated by decompensated diastolic heart failure, acute on chronic.    Assessment & Plan:   Active Problems:   Pneumonia  1. Acute on chronic hypoxic respiratory failure, due to aspirationpneumonia/ left lower lobe, complicated with COPD exacerbation. Antibiotic therapy with levofloxacin #3/8, continue aspiration precautions, oxymetry monitoring and supplemental 02 per [place]. Patient with significant dyspnea and wheezing, suspected exacerbation of COPD, will add systemic steroids and will continue bronchodilator therapy. Improved volume status.   2. Diastolic heart failure, acute on chronic, complicated by cardiogenic pulmonary edema. Continue furosemide to po. Urine output  over last 24 hours, 1,320, with negative fluid balance since admission -5,502 ml. Continue telemetry monitoring, blood pressure 166 systolic.   4. Paroxysmal atrial fibrillation.Ondiltiazem. Not on anticoagulation. Continue close telemetry monitoring.   5. Type 2 diabetes mellitus with hyperglycemia. Capillary glucose 264, 135, 318, 158, 248. Will add systemic steroids for COPD exacerbation, that may trigger worsening hyperglycemia, will increase basal insulin to 25 units and continue insulin sliding scale, for glucose cover and calculation of insulin requirements.   6. Dyslipidemia. On rosuvastatin  7. Infrarenal aortic aneurysm 6.1 cm diameter. No abdominal pain, plan to follow up as outpatient.     DVT prophylaxis:enoxaparin Code Status:full Family Communication:no family at the bedside Disposition Plan:home   Consultants:    Procedures:    Antimicrobials: Levofloxacin  Subjective: Improved lower extremity edema, but persistent dyspnea and increased work of breathing, desaturation down to 80's on room air.   Objective: Vitals:   06/30/17 2340 07/01/17 0200 07/01/17 0636 07/01/17 0841  BP:  (!) 107/52 106/60   Pulse:  65 61   Resp:  20 20   Temp:  (!) 97.3 F (36.3 C) 98.3 F (36.8 C)   TempSrc:  Oral Oral   SpO2: 90% 98% 90% (!) 84%  Weight:   75 kg (165 lb 5.5 oz)   Height:        Intake/Output Summary (Last 24 hours) at 07/01/17 0907 Last data filed at 07/01/17 0639  Gross per 24 hour  Intake              240 ml  Output             1050 ml  Net             -810 ml   Filed Weights   06/29/17 0528 06/30/17 0649 07/01/17 0636  Weight: 72.8 kg (160 lb 9.7 oz) 72.8 kg (160  lb 7.6 oz) 75 kg (165 lb 5.5 oz)    Examination:   General: Not in pain but positive dyspnea, deconditioned  Neurology: Awake and alert, non focal  E ENT: mild pallor, no icterus, oral mucosa moist Cardiovascular: No JVD. S1-S2 present, rhythmic, no gallops, rubs,  or murmurs. Trace lower extremity edema. Pulmonary: decreased breath sounds bilaterally, decreased air movement, expiratory wheezing and diffuse rhonchi with rales. Gastrointestinal. Abdomen flat, no organomegaly, non tender, no rebound or guarding Skin. No rashes Musculoskeletal: no joint deformities     Data Reviewed: I have personally reviewed following labs and imaging studies  CBC:  Recent Labs Lab 06/25/17 0606 06/28/17 0909 06/29/17 0432 06/30/17 0429 07/01/17 0606  WBC 11.1* 13.3* 11.8* 9.8 6.1  NEUTROABS  --  11.9*  --  7.9* 4.3  HGB 11.8* 12.9* 12.5* 12.4* 12.8*  HCT 38.0* 41.6 38.8* 40.4 40.5  MCV 85.2 85.4 82.9 85.4 84.2  PLT 189 126* 174 159 563   Basic Metabolic Panel:  Recent Labs Lab 06/25/17 0606 06/28/17 0909 06/29/17 0432 06/30/17 0429 07/01/17 0606  NA 140 139 140 138 139  K 3.8 4.2 5.1 4.0 3.7  CL 92* 97* 97* 93* 96*  CO2 40* 36* 35* 38* 36*  GLUCOSE 340* 270* 263* 243* 249*  BUN 24* 12 24* 27* 18  CREATININE 0.72 0.67 0.81 0.84 0.74  CALCIUM 8.4* 8.5* 8.9 8.6* 8.6*   GFR: Estimated Creatinine Clearance: 72 mL/min (by C-G formula based on SCr of 0.74 mg/dL). Liver Function Tests:  Recent Labs Lab 06/28/17 0909 06/29/17 0432  AST 11* 14*  ALT 20 18  ALKPHOS 67 62  BILITOT 0.9 0.6  PROT 5.5* 5.7*  ALBUMIN 2.8* 2.7*   No results for input(s): LIPASE, AMYLASE in the last 168 hours. No results for input(s): AMMONIA in the last 168 hours. Coagulation Profile: No results for input(s): INR, PROTIME in the last 168 hours. Cardiac Enzymes:  Recent Labs Lab 06/28/17 0909  TROPONINI <0.03   BNP (last 3 results) No results for input(s): PROBNP in the last 8760 hours. HbA1C: No results for input(s): HGBA1C in the last 72 hours. CBG:  Recent Labs Lab 06/30/17 0734 06/30/17 1154 06/30/17 1620 06/30/17 2055 07/01/17 0811  GLUCAP 215* 264* 135* 318* 158*   Lipid Profile: No results for input(s): CHOL, HDL, LDLCALC, TRIG,  CHOLHDL, LDLDIRECT in the last 72 hours. Thyroid Function Tests: No results for input(s): TSH, T4TOTAL, FREET4, T3FREE, THYROIDAB in the last 72 hours. Anemia Panel: No results for input(s): VITAMINB12, FOLATE, FERRITIN, TIBC, IRON, RETICCTPCT in the last 72 hours.    Radiology Studies: I have reviewed all of the imaging during this hospital visit personally     Scheduled Meds: . diltiazem  300 mg Oral Daily  . enoxaparin (LOVENOX) injection  40 mg Subcutaneous Q24H  . furosemide  40 mg Oral Daily  . Influenza vac split quadrivalent PF  0.5 mL Intramuscular Tomorrow-1000  . insulin aspart  0-15 Units Subcutaneous TID WC  . insulin glargine  20 Units Subcutaneous Daily  . ipratropium-albuterol  3 mL Nebulization Q6H  . montelukast  10 mg Oral Daily  . pantoprazole  40 mg Oral Daily  . rosuvastatin  20 mg Oral q1800  . sodium chloride flush  3 mL Intravenous Q12H  . sodium chloride HYPERTONIC  4 mL Nebulization BID  . vitamin B-12  2,500 mcg Oral Daily   Continuous Infusions: . sodium chloride    . levofloxacin (LEVAQUIN) IV Stopped (06/30/17 1613)  LOS: 3 days        Kylan Liberati Gerome Apley, MD [facility] Pager [phone]

## 2017-07-01 NOTE — Progress Notes (Signed)
Patient asleep at 0400 resting with out problems.

## 2017-07-02 DIAGNOSIS — Z720 Tobacco use: Secondary | ICD-10-CM

## 2017-07-02 DIAGNOSIS — J441 Chronic obstructive pulmonary disease with (acute) exacerbation: Secondary | ICD-10-CM

## 2017-07-02 LAB — GLUCOSE, CAPILLARY
GLUCOSE-CAPILLARY: 377 mg/dL — AB (ref 65–99)
GLUCOSE-CAPILLARY: 347 mg/dL — AB (ref 65–99)
GLUCOSE-CAPILLARY: 291 mg/dL — AB (ref 65–99)
Glucose-Capillary: 261 mg/dL — ABNORMAL HIGH (ref 65–99)

## 2017-07-02 LAB — BASIC METABOLIC PANEL
Anion gap: 9 (ref 5–15)
BUN: 22 mg/dL — AB (ref 6–20)
CHLORIDE: 95 mmol/L — AB (ref 101–111)
CO2: 34 mmol/L — ABNORMAL HIGH (ref 22–32)
CREATININE: 0.8 mg/dL (ref 0.61–1.24)
Calcium: 8.8 mg/dL — ABNORMAL LOW (ref 8.9–10.3)
Glucose, Bld: 422 mg/dL — ABNORMAL HIGH (ref 65–99)
POTASSIUM: 5.1 mmol/L (ref 3.5–5.1)
SODIUM: 138 mmol/L (ref 135–145)

## 2017-07-02 MED ORDER — NICOTINE 21 MG/24HR TD PT24
21.0000 mg | MEDICATED_PATCH | Freq: Every day | TRANSDERMAL | Status: DC
  Administered 2017-07-02 – 2017-07-03 (×2): 21 mg via TRANSDERMAL
  Filled 2017-07-02 (×2): qty 1

## 2017-07-02 MED ORDER — KETOROLAC TROMETHAMINE 15 MG/ML IJ SOLN
15.0000 mg | Freq: Once | INTRAMUSCULAR | Status: AC
Start: 2017-07-02 — End: 2017-07-02
  Administered 2017-07-02: 15 mg via INTRAVENOUS
  Filled 2017-07-02: qty 1

## 2017-07-02 MED ORDER — METHYLPREDNISOLONE SODIUM SUCC 40 MG IJ SOLR
20.0000 mg | Freq: Two times a day (BID) | INTRAMUSCULAR | Status: DC
  Administered 2017-07-02: 20 mg via INTRAVENOUS
  Filled 2017-07-02: qty 1

## 2017-07-02 MED ORDER — LEVOFLOXACIN 500 MG PO TABS
500.0000 mg | ORAL_TABLET | Freq: Every day | ORAL | Status: DC
  Administered 2017-07-02 – 2017-07-03 (×2): 500 mg via ORAL
  Filled 2017-07-02 (×2): qty 1

## 2017-07-02 NOTE — Plan of Care (Signed)
Problem: Activity: Goal: Risk for activity intolerance will decrease Outcome: Progressing Discussed need for rest periods to decrease demand for excess activities

## 2017-07-02 NOTE — Progress Notes (Signed)
PROGRESS NOTE    Andrew [name]  YCX:[id number] DOB: 03/15/1941 DOA: 06/28/2017 PCP: [facility]    Brief Narrative:  This is a 76 year old male who presented with worsening dyspnea, to the point where he has became symptomatic with minimal efforts. This is his fourth hospitalization since August 2018, alladmissions for decompensated respiratory failure. On initial physical examination, his respiratory distress, positive accessory muscle use, blood pressure 116/72, heart rate 86, respiratory 25, oxygen saturation 93% on 4 L nasal cannula. Dry mucous membranes, pale conjunctiva, lungs with diffuse rhonchi bilaterally and bibasilar rales, distant heart sounds, no S3 or S4 gallop, protuberant abdomen, significant pitting lower extremity edema. Sodium 139, potassium 4.2, chloride 97, bicarbonate 26, glucose 270, BUN 12, creatinine 0.67, white count 13.3, hemoglobin 12.9, hematocrit 41.6, platelets 126.Chest x-ray with left lower lobe infiltrate, confirmed by CT chest. Increased vascular congestion, suggestive of pulmonary edema.   Patient will be admitted to hospital with working diagnosis of acute on chronic hypoxic respiratory failure due to healthcare associated pneumonia/aspiration pneumonia/left lower lobe, complicated by decompensated diastolic heart failure, acute on chronic.   Assessment & Plan:   Active Problems:   Pneumonia   1. Acute on chronic hypoxic respiratory failure, due to aspirationpneumonia/ left lower lobe, complicated with COPD exacerbation. Levofloxacin #4/8, oxymetry monitoring and supplemental 02 per [place], oxymetry 92 to 94%, on 3 LPM . Symptoms starting to improve, will continue systemic steroids for now and aggressive bronchodilator therapy. Will get physical therapy, out of bed as tolerated, ambulate in the hallway. Smoking cessation, will start nicotine patch. Continue as needed alprazolam.   2. Diastolic heart failure, acute on chronic, complicated by  cardiogenic pulmonary edema. Urine output 1.550 L. Tolerating well po furosemide, will continue to target negative fluid balance. Blood pressure 497 systolic. Continue diltiazem. Echocardiography from 04/2017 with preserved systolic LV function, with EF 60 to 65%, with LVH. (old records personally reviewed)   4. Paroxysmal atrial fibrillation.Simus rhythm on telemetry monitor with hear rate controlled with oral diltiazem. Not on anticoagulation.  5. Type 2 diabetes mellitus with hyperglycemia. Persistent uncontrolled hyperglycemia, increase basal insulin to 25 units of glargine, will continue insulin sliding scale for glucose cover and monitoring, will continue to calculate requirements, and will decrease dose of methylprednisolone.   6. Dyslipidemia. Continue with rosuvastatin  7. Infrarenal aortic aneurysm 6.1 cm diameter. Stable, follow up as outpatient.    DVT prophylaxis:enoxaparin Code Status:full Family Communication:I spoke with patient's daughter over the phone at bedside, all questions were addressed. Patient will need all his prescriptions send through the [place] at [place].   Disposition Plan:home   Consultants:    Procedures:    Antimicrobials: Levofloxacin  Subjective: Patient feeling better, dyspnea has been improving, positive back pain, improved with analgesics, no nausea or vomiting, no chest pain.   Objective: Vitals:   07/01/17 2346 07/02/17 0211 07/02/17 0624 07/02/17 0738  BP: (!) 109/59  (!) 112/58   Pulse: 62  64   Resp: 18  18   Temp: 98.6 F (37 C)  98.3 F (36.8 C)   TempSrc: Oral  Oral   SpO2: 99% (!) 86% 94% 92%  Weight:   73.9 kg (163 lb)   Height:        Intake/Output Summary (Last 24 hours) at 07/02/17 1031 Last data filed at 07/02/17 0900  Gross per 24 hour  Intake              480 ml  Output  1550 ml  Net            -1070 ml   Filed Weights   06/30/17 0649 07/01/17 0636 07/02/17 0624  Weight: 72.8  kg (160 lb 7.6 oz) 75 kg (165 lb 5.5 oz) 73.9 kg (163 lb)    Examination:   General: deconditioned with positive dyspnea Neurology: Awake and alert, non focal  E ENT: mild pallor, no icterus, oral mucosa moist Cardiovascular: No JVD. S1-S2 present, rhythmic, no gallops, rubs, or murmurs. Trace lower extremity edema. Pulmonary: decreased breath sounds bilaterally, decreased air movement, no wheezing, scattered rhonchi and rales. Gastrointestinal. Abdomen flat, no organomegaly, non tender, no rebound or guarding Skin. No rashes Musculoskeletal: no joint deformities     Data Reviewed: I have personally reviewed following labs and imaging studies  CBC:  Recent Labs Lab 06/28/17 0909 06/29/17 0432 06/30/17 0429 07/01/17 0606  WBC 13.3* 11.8* 9.8 6.1  NEUTROABS 11.9*  --  7.9* 4.3  HGB 12.9* 12.5* 12.4* 12.8*  HCT 41.6 38.8* 40.4 40.5  MCV 85.4 82.9 85.4 84.2  PLT 126* 174 159 149   Basic Metabolic Panel:  Recent Labs Lab 06/28/17 0909 06/29/17 0432 06/30/17 0429 07/01/17 0606 07/02/17 0639  NA 139 140 138 139 138  K 4.2 5.1 4.0 3.7 5.1  CL 97* 97* 93* 96* 95*  CO2 36* 35* 38* 36* 34*  GLUCOSE 270* 263* 243* 249* 422*  BUN 12 24* 27* 18 22*  CREATININE 0.67 0.81 0.84 0.74 0.80  CALCIUM 8.5* 8.9 8.6* 8.6* 8.8*   GFR: Estimated Creatinine Clearance: 71.6 mL/min (by C-G formula based on SCr of 0.8 mg/dL). Liver Function Tests:  Recent Labs Lab 06/28/17 0909 06/29/17 0432  AST 11* 14*  ALT 20 18  ALKPHOS 67 62  BILITOT 0.9 0.6  PROT 5.5* 5.7*  ALBUMIN 2.8* 2.7*   No results for input(s): LIPASE, AMYLASE in the last 168 hours. No results for input(s): AMMONIA in the last 168 hours. Coagulation Profile: No results for input(s): INR, PROTIME in the last 168 hours. Cardiac Enzymes:  Recent Labs Lab 06/28/17 0909  TROPONINI <0.03   BNP (last 3 results) No results for input(s): PROBNP in the last 8760 hours. HbA1C: No results for input(s): HGBA1C in  the last 72 hours. CBG:  Recent Labs Lab 07/01/17 0811 07/01/17 1202 07/01/17 1654 07/01/17 2336 07/02/17 0732  GLUCAP 158* 248* 165* 302* 377*   Lipid Profile: No results for input(s): CHOL, HDL, LDLCALC, TRIG, CHOLHDL, LDLDIRECT in the last 72 hours. Thyroid Function Tests: No results for input(s): TSH, T4TOTAL, FREET4, T3FREE, THYROIDAB in the last 72 hours. Anemia Panel: No results for input(s): VITAMINB12, FOLATE, FERRITIN, TIBC, IRON, RETICCTPCT in the last 72 hours.    Radiology Studies: I have reviewed all of the imaging during this hospital visit personally     Scheduled Meds: . diltiazem  300 mg Oral Daily  . enoxaparin (LOVENOX) injection  40 mg Subcutaneous Q24H  . furosemide  40 mg Oral Daily  . Influenza vac split quadrivalent PF  0.5 mL Intramuscular Tomorrow-1000  . insulin aspart  0-15 Units Subcutaneous TID WC  . insulin glargine  25 Units Subcutaneous Daily  . ipratropium-albuterol  3 mL Nebulization Q6H  . methylPREDNISolone (SOLU-MEDROL) injection  20 mg Intravenous Q12H  . montelukast  10 mg Oral Daily  . nicotine  21 mg Transdermal Daily  . pantoprazole  40 mg Oral Daily  . rosuvastatin  20 mg Oral q1800  . sodium chloride flush  3 mL Intravenous Q12H  . vitamin B-12  2,500 mcg Oral Daily   Continuous Infusions: . sodium chloride    . levofloxacin (LEVAQUIN) IV Stopped (07/01/17 1833)     LOS: 4 days        Dulcey Riederer Andrew Apley, MD [facility] Pager [phone]

## 2017-07-02 NOTE — Plan of Care (Signed)
Problem: Safety: Goal: Ability to remain free from injury will improve Outcome: Progressing Discussed safety interventions with client, i.e. Call before fall; bed in low position; side rails up x2; bed alarm re-activated.

## 2017-07-03 LAB — GLUCOSE, CAPILLARY
GLUCOSE-CAPILLARY: 566 mg/dL — AB (ref 65–99)
Glucose-Capillary: 467 mg/dL — ABNORMAL HIGH (ref 65–99)
Glucose-Capillary: 265 mg/dL — ABNORMAL HIGH (ref 65–99)
Glucose-Capillary: 159 mg/dL — ABNORMAL HIGH (ref 65–99)

## 2017-07-03 LAB — CULTURE, BLOOD (ROUTINE X 2)
CULTURE: NO GROWTH
Culture: NO GROWTH
SPECIAL REQUESTS: ADEQUATE

## 2017-07-03 LAB — BASIC METABOLIC PANEL WITH GFR
Anion gap: 7 (ref 5–15)
BUN: 27 mg/dL — ABNORMAL HIGH (ref 6–20)
CO2: 32 mmol/L (ref 22–32)
Calcium: 8.8 mg/dL — ABNORMAL LOW (ref 8.9–10.3)
Chloride: 92 mmol/L — ABNORMAL LOW (ref 101–111)
Creatinine, Ser: 0.93 mg/dL (ref 0.61–1.24)
GFR calc Af Amer: >60 mL/min
GFR calc non Af Amer: >60 mL/min
Glucose, Bld: 460 mg/dL — ABNORMAL HIGH (ref 65–99)
Potassium: 5.1 mmol/L (ref 3.5–5.1)
Sodium: 131 mmol/L — ABNORMAL LOW (ref 135–145)

## 2017-07-03 LAB — POTASSIUM: Potassium: 4.7 mmol/L (ref 3.5–5.1)

## 2017-07-03 LAB — GLUCOSE, RANDOM: GLUCOSE: 405 mg/dL — AB (ref 65–99)

## 2017-07-03 MED ORDER — VITAMIN B-12 2500 MCG SL SUBL: 2500.0000 ug | SUBLINGUAL_TABLET | Freq: Every day | SUBLINGUAL | 0 refills | Status: AC

## 2017-07-03 MED ORDER — INSULIN GLARGINE 100 UNITS/ML SOLOSTAR PEN: 25.0000 [IU] | PEN_INJECTOR | Freq: Every day | SUBCUTANEOUS | 0 refills | Status: DC

## 2017-07-03 MED ORDER — PREDNISONE 10 MG PO TABS: 10.0000 mg | ORAL_TABLET | Freq: Every day | ORAL | 0 refills | Status: AC

## 2017-07-03 MED ORDER — INSULIN GLARGINE 100 UNIT/ML ~~LOC~~ SOLN
30.0000 [IU] | Freq: Every day | SUBCUTANEOUS | Status: DC
  Administered 2017-07-03: 30 [IU] via SUBCUTANEOUS
  Filled 2017-07-03 (×2): qty 0.3

## 2017-07-03 MED ORDER — INSULIN ASPART 100 UNIT/ML FLEXPEN: PEN_INJECTOR | SUBCUTANEOUS | 0 refills | Status: DC

## 2017-07-03 MED ORDER — FUROSEMIDE 40 MG PO TABS
40.0000 mg | ORAL_TABLET | Freq: Every day | ORAL | 0 refills | Status: DC
Start: 2017-07-03 — End: 2017-12-30

## 2017-07-03 MED ORDER — FLUTICASONE-SALMETEROL 500-50 MCG/DOSE IN AEPB: 1.0000 | INHALATION_SPRAY | Freq: Two times a day (BID) | RESPIRATORY_TRACT | 0 refills | Status: DC

## 2017-07-03 MED ORDER — MONTELUKAST SODIUM 10 MG PO TABS: 10.0000 mg | ORAL_TABLET | Freq: Every day | ORAL | 0 refills | Status: DC

## 2017-07-03 MED ORDER — ALBUTEROL SULFATE HFA 108 (90 BASE) MCG/ACT IN AERS: 2.0000 | INHALATION_SPRAY | RESPIRATORY_TRACT | 0 refills | Status: DC | PRN

## 2017-07-03 MED ORDER — DILTIAZEM HCL ER BEADS 300 MG PO CP24: 300.0000 mg | ORAL_CAPSULE | Freq: Every day | ORAL | 0 refills | Status: DC

## 2017-07-03 MED ORDER — PREDNISONE 10 MG PO TABS
10.0000 mg | ORAL_TABLET | Freq: Every day | ORAL | Status: DC
  Administered 2017-07-03: 10 mg via ORAL
  Filled 2017-07-03: qty 1

## 2017-07-03 MED ORDER — INSULIN STARTER KIT- PEN NEEDLES (ENGLISH)
1.0000 | Freq: Once | Status: AC
  Administered 2017-07-03: 1
  Filled 2017-07-03: qty 1

## 2017-07-03 MED ORDER — ALBUTEROL SULFATE (2.5 MG/3ML) 0.083% IN NEBU: 2.5000 mg | INHALATION_SOLUTION | RESPIRATORY_TRACT | 12 refills | Status: AC | PRN

## 2017-07-03 MED ORDER — ROSUVASTATIN CALCIUM 20 MG PO TABS: 20.0000 mg | ORAL_TABLET | Freq: Every day | ORAL | 0 refills | Status: DC

## 2017-07-03 MED ORDER — TIOTROPIUM BROMIDE MONOHYDRATE 18 MCG IN CAPS: 18.0000 ug | ORAL_CAPSULE | Freq: Every day | RESPIRATORY_TRACT | 2 refills | Status: AC

## 2017-07-03 MED ORDER — ALPRAZOLAM 0.25 MG PO TABS: 0.2500 mg | ORAL_TABLET | Freq: Three times a day (TID) | ORAL | 0 refills | Status: DC | PRN

## 2017-07-03 MED ORDER — MELATONIN 3 MG PO TABS: 1.0000 | ORAL_TABLET | Freq: Every day | ORAL | 0 refills | Status: AC

## 2017-07-03 MED ORDER — LEVOFLOXACIN 500 MG PO TABS: 500.0000 mg | ORAL_TABLET | Freq: Every day | ORAL | 0 refills | Status: AC

## 2017-07-03 MED ORDER — IBUPROFEN 200 MG PO TABS: 200.0000 mg | ORAL_TABLET | Freq: Three times a day (TID) | ORAL | 0 refills | Status: DC | PRN

## 2017-07-03 MED ORDER — INSULIN ASPART 100 UNIT/ML ~~LOC~~ SOLN
20.0000 [IU] | Freq: Once | SUBCUTANEOUS | Status: AC
  Administered 2017-07-03: 20 [IU] via SUBCUTANEOUS

## 2017-07-03 MED ORDER — LIVING WELL WITH DIABETES BOOK
Freq: Once | Status: AC
  Administered 2017-07-03: 16:00:00
  Filled 2017-07-03: qty 1

## 2017-07-03 MED ORDER — NICOTINE 21 MG/24HR TD PT24: 21.0000 mg | MEDICATED_PATCH | Freq: Every day | TRANSDERMAL | 0 refills | Status: DC

## 2017-07-03 NOTE — Progress Notes (Signed)
Patient's daughter arrived for discharge.  AVS reviewed with daughter/caregiver.  Patient's daughter reports that they "cannot afford the insulin, syringes prescribed.  We couldn't afford them the last time he was here.  It was going to be $600 when we went to pick him up."  They did not get his medicine filled after his last hospital discharge.  Prescriptions faxed to [facility] per daughter and patient's request.  Patient's daughter asked for insulin to be sent home with patient.  Case management notified reports patient has insurance so case management unable to provide any assistance.  Dr. Cathlean Sauer notified. via text page.  AC notified.

## 2017-07-03 NOTE — Care Management Important Message (Signed)
Important Message  Patient Details  Name: Andrew [name] MRN: [id number] Date of Birth: 1941-02-17   Medicare Important Message Given:  Yes    [name], [name], RN 07/03/2017, 10:36 AM

## 2017-07-03 NOTE — Progress Notes (Signed)
Patient refused for lab to collect blood to recheck blood glucose.  RN tried to encourage patient to allow lab tech to draw blood, but patient still refused.

## 2017-07-03 NOTE — Progress Notes (Signed)
Upon discussing discharge instructions, patient's daughter made it aware patient did not have Lantus nor Novolog at home and could not afford it.  Patient's medication prescriptions were faxed to [facility] per patient and patient's daughter request due to being able to get prescriptions for free but would have to get these medications via mail. CM was notified that patient could not afford these insulins and was told that patient could not receive any assistance due to having insurance.  Tried calling the [place] that patient uses to see what the price would be but patient's insurance cards were expired that were on file and patient could not find new insurance cards for prescriptions. Patient spoke with Dr. Cathlean Sauer and was encouraged to stay another night until this problem could be fixed, but patient declined to stay in the hospital any longer and was discharged home. Advised patient to try to get insulins as soon as possible and begin taking. Patient stated he would.

## 2017-07-03 NOTE — Discharge Summary (Addendum)
Physician Discharge Summary  EDSON DERIDDER ACZ:[id number] DOB: 1941/04/03 DOA: 06/28/2017  PCP: [facility]  Admit date: 06/28/2017 Discharge date: 07/03/2017  Admitted From: Home Disposition:  Home  Recommendations for Outpatient Follow-up:  1. Follow up with PCP in 1- week 2. Patient has been placed on inhaled long acting beta 2 agonist and corticosteroid 3. Long acting inhaled anticholinergic daily. 4. 3 more days of systemic steroids. 5. 4 more days of Levofloxacin. 6. Pain control with as needed ibuprofen. 7. Furosemide increased to 40 mg daily. 8. Aspiration precautions with dysphagia 3 (mech soft) solids.  Home Health: Yes  Equipment/Devices: No   Discharge Condition: Stable CODE STATUS: Full  Diet recommendation: Heart healthy and diabetic prudent  Dysphagia 3 (Mech soft) solids;Thin liquid Liquid Administration via: Cup;No straw Medication Administration: Whole meds with liquid (or whole with puree) Supervision: Intermittent supervision to cue for compensatory strategies Compensations: Slow rate (avoid talking during meals, pt already SOB) Postural Changes: Remain semi-upright after after feeds/meals (Comment);Seated upright at 90 degrees  Brief/Interim Summary: This is a 76 year old male who presented with worsening dyspnea, to the point where he became symptomatic with minimal efforts. This was his fourth hospitalization since August 2018, alladmissions for decompensated respiratory failure. On initial physical examination, he was in respiratory distress, positive accessory muscle use, blood pressure 116/72, heart rate 86, respiratory 25, oxygen saturation 93% on 4 L nasal cannula. Dry mucous membranes, pale conjunctiva, lungs with diffuse rhonchi bilaterally and bibasilar rales, distant heart sounds, no S3 or S4 gallop, protuberant abdomen, significant pitting lower extremity edema +++. Sodium 139, potassium 4.2, chloride 97, bicarbonate 26, glucose 270,  BUN 12, creatinine 0.67, white count 13.3, hemoglobin 12.9, hematocrit 41.6, platelets 126.Chest x-ray with left lower lobe infiltrate, confirmed by CT chest. Increased vascular congestion, suggestive of pulmonary edema.   Patient will be admitted to hospital with working diagnosis of acute on chronic hypoxic respiratory failure due to healthcare associated pneumonia/aspiration pneumonia/left lower lobe, complicated by decompensated diastolic heart failure, acute on chronic.  1. Acute on chronic hypoxic respiratory failure due to aspiration pneumonia, left lower lobe, complicated by COPD exacerbation. Patient was admitted to the medical ward, he had oximetry monitoring and supplemental oxygen per nasal cannula. Antibiotic therapy with intravenous levofloxacin, aggressive bronchodilator therapy with DuoNeb, systemic steroids, and aspiration precautions. Patient had responded well to medical therapy, he was deemed to be stable to discharge on October 29. Patient will continue bronchodilator at home, albuterol per MDI and nebulizer, will place patient on long-acting inhaled beta 2 agonist, inhaled corticosteroids, and long-acting inhaled anticholinergic. Continue home oxygen supplementation. Patient show significant anxiety, exacerbating his dyspnea, as needed alprazolam prescribed with good response. Close follow-up as an outpatient. Discharged oximetry 93% on 3 L per nasal cannula.   2. Aspiration pneumonia. Patient will complete therapy with levofloxacin, aspiration precautions. Patient was evaluated by speech therapy, with recommendations for a dysphagia 3 diet.  3. Diastolic heart failure, acute on chronic, complicated by cardiogenic pulmonary edema. Patient was aggressively diuresed with IV furosemide, negative fluid balance was achieved with significant improvement of his symptoms. By the time of discharge he reached a negative balance of 7,042 ml. Continue furosemide at home, dose will be increased  to 40 mg daily. Continue blood pressure control with diltiazem.   4. Paroxysmal atrial fibrillation. Patient was placed on a remote telemetry monitor, he remained sinus rhythm. Will continue diltiazem for rate control. Currently patient not on anticoagulation, will refer for outpatient follow-up.  5. Type 2  diabetes mellitus with hyperglycemia. Patient developed hyperglycemia likely related to his acute illness and systemic steroids, he required insulin short and long-acting for glucose control. Continue insulin sliding scale and insulin Levemir at home.   6. Dyslipidemia. Continue rosuvastatin.  7. Infrarenal aortic aneurysm 6.1 cm diameter. Continue follow-up as an outpatient, continue conservative therapy.   8. Tobacco abuse. Smoking cessation counseling, nicotine patch will be prescribed  Late entry: prescriptions for all his medications have been faxed to the [place], but still no answer, patient not able to get medications tonight, but he is committed to contact the [place] in am to have his medications sent to him. His capillary glucose is 159. I offer the patient and his daughter to allow patient to stay in the hospital until getting his medications at home, but he is very insisting in going home. I explained in detail the risk of he getting sick, including death. He understands and still insisting in going home. All questions have been addressed.     Discharge Diagnoses:  Active Problems:   Pneumonia    Discharge Instructions  Discharge Instructions    Diet - low sodium heart healthy    Complete by:  As directed    Discharge instructions    Complete by:  As directed    Please follow up with primary care in 7 days.   Increase activity slowly    Complete by:  As directed      Allergies as of 07/03/2017      Reactions   Procaine Other (See Comments), Shortness Of Breath   Syncope   Procaine Hcl Anaphylaxis, Swelling   Atorvastatin Palpitations   REACTION: Heartburn       Medication List    STOP taking these medications   apixaban 2.5 MG Tabs tablet Commonly known as:  ELIQUIS   azithromycin 500 MG tablet Commonly known as:  ZITHROMAX   ipratropium-albuterol 0.5-2.5 (3) MG/3ML Soln Commonly known as:  DUONEB   nicotine 14 mg/24hr patch Commonly known as:  NICODERM CQ - dosed in mg/24 hours Replaced by:  nicotine 21 mg/24hr patch   potassium chloride 10 MEQ tablet Commonly known as:  K-DUR   senna-docusate 8.6-50 MG tablet Commonly known as:  Senokot-S     TAKE these medications   acetaminophen 650 MG CR tablet Commonly known as:  TYLENOL Take 650 mg by mouth every 8 (eight) hours as needed for pain.   albuterol (2.5 MG/3ML) 0.083% nebulizer solution Commonly known as:  PROVENTIL Take 3 mLs (2.5 mg total) by nebulization every 4 (four) hours as needed for wheezing or shortness of breath.   albuterol 108 (90 Base) MCG/ACT inhaler Commonly known as:  PROVENTIL HFA;VENTOLIN HFA Inhale 2 puffs into the lungs every 4 (four) hours as needed for wheezing.   ALPRAZolam 0.25 MG tablet Commonly known as:  XANAX Take 1 tablet (0.25 mg total) by mouth 3 (three) times daily as needed for anxiety.   diltiazem 300 MG 24 hr capsule Commonly known as:  TIAZAC Take 1 capsule (300 mg total) by mouth daily.   Fluticasone-Salmeterol 500-50 MCG/DOSE Aepb Commonly known as:  ADVAIR DISKUS Inhale 1 puff into the lungs 2 (two) times daily.   furosemide 40 MG tablet Commonly known as:  LASIX Take 1 tablet (40 mg total) by mouth daily. What changed:  how much to take   ibuprofen 200 MG tablet Commonly known as:  ADVIL,MOTRIN Take 1 tablet (200 mg total) by mouth every 8 (eight) hours as needed (  back pain).   insulin aspart 100 UNIT/ML FlexPen Commonly known as:  NOVOLOG Check CBG's there etimes daily before meals. For CBG less than 150: 0 Units For CBG 150-200: 2 Units SQ For CBG 200-250: 4 Units SQ For CBG 250-300: 6 Units SQ For CBG 300-350: 8 Units  SQ and call PCP for further instructions   insulin glargine 100 unit/mL Sopn Commonly known as:  LANTUS Inject 0.25 mLs (25 Units total) into the skin daily.   levofloxacin 500 MG tablet Commonly known as:  LEVAQUIN Take 1 tablet (500 mg total) by mouth daily.   Melatonin 3 MG Tabs Take 1 tablet (3 mg total) by mouth at bedtime.   montelukast 10 MG tablet Commonly known as:  SINGULAIR Take 1 tablet (10 mg total) by mouth daily.   nicotine 21 mg/24hr patch Commonly known as:  NICODERM CQ - dosed in mg/24 hours Place 1 patch (21 mg total) onto the skin daily. Replaces:  nicotine 14 mg/24hr patch   omeprazole 40 MG capsule Commonly known as:  PRILOSEC Take 40 mg by mouth daily.   predniSONE 10 MG tablet Commonly known as:  DELTASONE Take 1 tablet (10 mg total) by mouth daily with breakfast. What changed:  medication strength  how much to take  how to take this  when to take this  additional instructions   rosuvastatin 20 MG tablet Commonly known as:  CRESTOR Take 1 tablet (20 mg total) by mouth daily.   tiotropium 18 MCG inhalation capsule Commonly known as:  SPIRIVA HANDIHALER Place 1 capsule (18 mcg total) into inhaler and inhale daily.   Vitamin B-12 2500 MCG Subl Place 1 tablet (2,500 mcg total) under the tongue daily.      Follow-up Information    [facility] Follow up.   Contact information: [place] [address] [phone]        [facility] Follow up in 1 week(s).   Why:  Call Clinic for appointment Contact information: [place] [address] [phone]          Allergies  Allergen Reactions  . Procaine Other (See Comments) and Shortness Of Breath    Syncope  . Procaine Hcl Anaphylaxis and Swelling  . Atorvastatin Palpitations    REACTION: Heartburn    Consultations:     Procedures/Studies: Dg Chest 1 View  Result Date:  06/29/2017 CLINICAL DATA:  Shortness of breath, atrial fib, CHF, COPD EXAM: CHEST 1 VIEW COMPARISON:  06/28/2017 FINDINGS: Vascular congestion. Heart is borderline in size. Left perihilar and lower lobe opacity again noted, unchanged. No effusions. IMPRESSION: Stable left lower lobe opacity concerning for pneumonia. Mild vascular congestion. Electronically Signed   By: Rolm Baptise M.D.   On: 06/29/2017 08:27   Ct Chest W Contrast  Result Date: 06/28/2017 CLINICAL DATA:  76 year old male with history of head congestive heart failure. Increasing shortness of breath. EXAM: CT CHEST WITH CONTRAST TECHNIQUE: Multidetector CT imaging of the chest was performed during intravenous contrast administration. CONTRAST:  27mL ISOVUE-300 IOPAMIDOL (ISOVUE-300) INJECTION 61% COMPARISON:  No priors. FINDINGS: Cardiovascular: Heart size is normal. There is no significant pericardial fluid, thickening or pericardial calcification. There is aortic atherosclerosis, as well as atherosclerosis of the great vessels of the mediastinum and the coronary arteries, including calcified atherosclerotic plaque in the the main, left anterior descending, left circumflex and right coronary arteries. Calcifications of the aortic valve. Lipomatous hypertrophy of the interatrial septum. Mediastinum/Nodes: No pathologically enlarged  mediastinal or hilar lymph nodes. Esophagus is unremarkable in appearance. No axillary lymphadenopathy. Lungs/Pleura: Diffuse bronchial wall thickening. Scattered areas of more severe thickening of the peribronchovascular interstitium with associated peribronchovascular micro and macronodularity. These findings are most severe in the inferior segment of the lingula and in the basal segments of the left lower lobe where there are confluent areas of nodularity and surrounding airspace consolidation. Mild centrilobular and paraseptal emphysema. Trace left pleural effusion lying dependently. Upper Abdomen: Aortic  atherosclerosis. Musculoskeletal: There are no aggressive appearing lytic or blastic lesions noted in the visualized portions of the skeleton. IMPRESSION: 1. The appearance of the chest is most compatible with multilobar bronchopneumonia, potentially related to recent aspiration. At this time, there is extensive mucoid impaction and widespread airspace disease most evident in the left lower lobe and lingula. 2. Aortic atherosclerosis, in addition to left main and 3 vessel coronary artery disease. Please note that although the presence of coronary artery calcium documents the presence of coronary artery disease, the severity of this disease and any potential stenosis cannot be assessed on this non-gated CT examination. Assessment for potential risk factor modification, dietary therapy or pharmacologic therapy may be warranted, if clinically indicated. 3. There are calcifications of the aortic valve. Echocardiographic correlation for evaluation of potential valvular dysfunction may be warranted if clinically indicated. 4. Mild centrilobular and paraseptal emphysema. 5. Lipomatous hypertrophy of the interatrial septum. This can be associated with arrhythmias and some individuals. Aortic Atherosclerosis (ICD10-I70.0) and Emphysema (ICD10-J43.9). Electronically Signed   By: Vinnie Langton M.D.   On: 06/28/2017 10:59   Dg Chest Portable 1 View  Result Date: 06/28/2017 CLINICAL DATA:  Shortness of Breath EXAM: PORTABLE CHEST 1 VIEW COMPARISON:  June 22, 2017 FINDINGS: There is patchy airspace consolidation in the left base with small left pleural effusion. Lungs elsewhere clear. Heart is upper normal in size with pulmonary vascularity within normal limits. No adenopathy. Prominence in the right paratracheal region is felt to represent great vessel prominence in this age group. No bone lesions are evident. IMPRESSION: Left lower lobe airspace consolidation, likely pneumonia, with small left pleural effusion. Lungs  elsewhere clear. Heart upper normal in size. Followup PA and lateral chest radiographs recommended in 3-4 weeks following trial of antibiotic therapy to ensure resolution and exclude underlying malignancy. Electronically Signed   By: Lowella Grip III M.D.   On: 06/28/2017 08:53   Dg Chest Portable 1 View  Result Date: 06/22/2017 CLINICAL DATA:  Shortness of breath. EXAM: PORTABLE CHEST 1 VIEW COMPARISON:  Chest x-ray dated May 13, 2017. FINDINGS: Stable mild cardiomegaly. Bibasilar opacities are largely unchanged. No large pleural effusion. No pneumothorax. No acute osseous abnormality. IMPRESSION: Largely unchanged bibasilar opacities, likely atelectasis. Stable cardiomegaly. Electronically Signed   By: Titus Dubin M.D.   On: 06/22/2017 20:47   Dg Swallowing Func-speech Pathology  Result Date: 06/29/2017 Objective Swallowing Evaluation: Type of Study: MBS-Modified Barium Swallow Study Patient Details Name: GOBLE FUDALA MRN: [id number] Date of Birth: Dec 31, 1940 Today's Date: 06/29/2017 Time: SLP Start Time (ACUTE ONLY): 1632-SLP Stop Time (ACUTE ONLY): 1700 SLP Time Calculation (min) (ACUTE ONLY): 28 min Past Medical History: Past Medical History: Diagnosis Date . A-fib ([place])  . Abdominal pain  . Adenomatous colon polyp  . Arthritis  . Asthma  . CAD (coronary artery disease)  . Cancer ([place])   skin, nose . Cataract   bilateral . CHF (congestive heart failure) ([place])   patient reports . Chronic LBP  . COPD (chronic obstructive pulmonary  disease) ([place])  . Decreased libido  . Diverticulosis of colon  . Esophageal stricture  . Fatigue  . GERD (gastroesophageal reflux disease)  . History of small bowel obstruction  . Hyperlipidemia  . Hypertension  . Hypertrophy of prostate with urinary obstruction and other lower urinary tract symptoms (LUTS)  . Palpitations  . Peri-rectal abscess  . Personal history of renal calculi  . Prostatitis, acute  . Stroke [facility])   pt reports . Tobacco abuse  Past Surgical  History: Past Surgical History: Procedure Laterality Date . CATARACT EXTRACTION  04/2010  bilateral . HERNIA REPAIR   . LUMBAR LAMINECTOMY    with Harrington rods . PENILE PROSTHESIS PLACEMENT   . TONSILLECTOMY   HPI: 76 year-old male with history of GERD, COPD and CHF who presented yesterday with SOB. Pt was hospitalized here from 06/23/17-06/25/17 for COPD exacerbation and was discharged home. He states that his breathing never did improve at that time. He was also hospitalized with respiratory failure requiring intubation in August. On presentation yesterday, pt had bibasilar crackles,LE edema and JVD. CXR with lower left lobe infiltratethat looks to be unchanged over the past couple of months as well as pulmonary vascular congestion. Pt was admitted for treatment of acute on chronic hypoxic respiratory failure 2/2 HCAP/aspiration PNA, and acute on chronic decompensated diastolic heart failure. Pt had MBSS 10/31/2016 at [facility] while at [facility] for rehab (risk for aspiration due to COPD and SOB). This is his 4th hospitalization in the past 6 months. MD requested swallowing evaluation due to suspected aspiration PNA. Subjective: "I need a Xanax." Assessment / Plan / Recommendation CHL IP CLINICAL IMPRESSIONS 06/29/2017 Clinical Impression Pt assessed with barium tinged thin, puree, regular, barium tablet, and nectar-thick liquids in the lateral position for MBSS. Pt visibly short of breath throughout the evaluation. Pt demonstrates mild oral phase dysphagia characterized by delayed oral transit with dry solids likely negatively impacted by shortness of breath; mild pharyngeal phase dysphagia characterized by premature spillage of liquids over base of tongue with swallow trigger at the level of the pyriforms for thin and nectar and decreased laryngeal vestibule closure resulting in penetration into laryngeal vestibule (variable trace to min amount) with thins and nectars during the swallow. Pt penetrated a greater amount  when taking straw sips thin, nectars, and when taking pill with thin, however no aspiration was observed throughout the study. Esophageal sweep revealed adequate/timely transfer of barium tablet to stomach. Recommend D3/mech soft (encourage moist textures) and thin liquids and no straws. Pt should clear throat/cough periodically to ensure clearance of liquids from laryngeal vestibule and repeat/dry swallow. Pt would likely benefit from ongoing education regarding risks for aspiration and compensatory strategies.  SLP Visit Diagnosis Dysphagia, oropharyngeal phase (R13.12) Attention and concentration deficit following -- Frontal lobe and executive function deficit following -- Impact on safety and function Mild aspiration risk   CHL IP TREATMENT RECOMMENDATION 06/29/2017 Treatment Recommendations Therapy as outlined in treatment plan below   Prognosis 06/29/2017 Prognosis for Safe Diet Advancement Good Barriers to Reach Goals (No Data) Barriers/Prognosis Comment -- CHL IP DIET RECOMMENDATION 06/29/2017 SLP Diet Recommendations Dysphagia 3 (Mech soft) solids;Thin liquid Liquid Administration via Cup;No straw Medication Administration Whole meds with liquid Compensations Slow rate Postural Changes Remain semi-upright after after feeds/meals (Comment);Seated upright at 90 degrees   CHL IP OTHER RECOMMENDATIONS 06/29/2017 Recommended Consults -- Oral Care Recommendations Oral care BID;Staff/trained caregiver to provide oral care Other Recommendations Clarify dietary restrictions   CHL IP FOLLOW UP RECOMMENDATIONS 06/29/2017 Follow  up Recommendations None   CHL IP FREQUENCY AND DURATION 06/29/2017 Speech Therapy Frequency (ACUTE ONLY) min 2x/week Treatment Duration 1 week      CHL IP ORAL PHASE 06/29/2017 Oral Phase Impaired Oral - Pudding Teaspoon -- Oral - Pudding Cup -- Oral - Honey Teaspoon -- Oral - Honey Cup -- Oral - Nectar Teaspoon -- Oral - Nectar Cup -- Oral - Nectar Straw -- Oral - Thin Teaspoon -- Oral - Thin  Cup -- Oral - Thin Straw -- Oral - Puree -- Oral - Mech Soft -- Oral - Regular Delayed oral transit;Impaired mastication Oral - Multi-Consistency -- Oral - Pill -- Oral Phase - Comment --  CHL IP PHARYNGEAL PHASE 06/29/2017 Pharyngeal Phase Impaired Pharyngeal- Pudding Teaspoon -- Pharyngeal -- Pharyngeal- Pudding Cup -- Pharyngeal -- Pharyngeal- Honey Teaspoon -- Pharyngeal -- Pharyngeal- Honey Cup -- Pharyngeal -- Pharyngeal- Nectar Teaspoon -- Pharyngeal -- Pharyngeal- Nectar Cup Delayed swallow initiation-pyriform sinuses;Reduced airway/laryngeal closure;Penetration/Aspiration during swallow Pharyngeal Material does not enter airway;Material enters airway, remains ABOVE vocal cords then ejected out Pharyngeal- Nectar Straw -- Pharyngeal -- Pharyngeal- Thin Teaspoon -- Pharyngeal -- Pharyngeal- Thin Cup WFL Pharyngeal -- Pharyngeal- Thin Straw Delayed swallow initiation-pyriform sinuses;Reduced airway/laryngeal closure;Penetration/Aspiration during swallow;Penetration/Apiration after swallow Pharyngeal Material does not enter airway;Material enters airway, remains ABOVE vocal cords then ejected out;Material enters airway, remains ABOVE vocal cords and not ejected out Pharyngeal- Puree Delayed swallow initiation-vallecula Pharyngeal -- Pharyngeal- Mechanical Soft -- Pharyngeal -- Pharyngeal- Regular Delayed swallow initiation-vallecula Pharyngeal -- Pharyngeal- Multi-consistency -- Pharyngeal -- Pharyngeal- Pill Penetration/Aspiration before swallow;Delayed swallow initiation-vallecula Pharyngeal Material enters airway, remains ABOVE vocal cords then ejected out Pharyngeal Comment --  CHL IP CERVICAL ESOPHAGEAL PHASE 06/29/2017 Cervical Esophageal Phase WFL Pudding Teaspoon -- Pudding Cup -- Honey Teaspoon -- Honey Cup -- Nectar Teaspoon -- Nectar Cup -- Nectar Straw -- Thin Teaspoon -- Thin Cup -- Thin Straw -- Puree -- Mechanical Soft -- Regular -- Multi-consistency -- Pill -- Cervical Esophageal Comment -- CHL  IP GO 10/31/2016 Functional Assessment Tool Used clinical judgment; MBSS Functional Limitations Swallowing Swallow Current Status (C9449) CJ Swallow Goal Status (Q7591) CJ Swallow Discharge Status (M3846) CJ Motor Speech Current Status (K5993) (None) Motor Speech Goal Status (T7017) (None) Motor Speech Goal Status (B9390) (None) Spoken Language Comprehension Current Status (Z0092) (None) Spoken Language Comprehension Goal Status (Z3007) (None) Spoken Language Comprehension Discharge Status (M2263) (None) Spoken Language Expression Current Status (F3545) (None) Spoken Language Expression Goal Status (G2563) (None) Spoken Language Expression Discharge Status (S9373) (None) Attention Current Status (S2876) (None) Attention Goal Status (O1157) (None) Attention Discharge Status (W6203) (None) Memory Current Status (T5974) (None) Memory Goal Status (B6384) (None) Memory Discharge Status (T3646) (None) Voice Current Status (O0321) (None) Voice Goal Status (Y2482) (None) Voice Discharge Status (N0037) (None) Other Speech-Language Pathology Functional Limitation Current Status (C4888) (None) Other Speech-Language Pathology Functional Limitation Goal Status (B1694) (None) Other Speech-Language Pathology Functional Limitation Discharge Status [phone]) (None) Thank you, Genene Churn, Perrin [place] 06/29/2017, 6:54 PM                  Subjective: Patient feeling better, dyspnea close to baseline, no nausea or vomiting, no chest pain.   Discharge Exam: Vitals:   07/03/17 0750 07/03/17 1330  BP:    Pulse:    Resp:    Temp:    SpO2: (!) 89% 90%   Vitals:   07/03/17 0442 07/03/17 0609 07/03/17 0750 07/03/17 1330  BP:  (!) 108/42    Pulse:  66  Resp:  16    Temp:  98.7 F (37.1 C)    TempSrc:  Oral    SpO2: 92% 93% (!) 89% 90%  Weight:  75.8 kg (167 lb 1.6 oz)    Height:  5\' 5"  (1.651 m)      General: Pt is alert, awake, not in acute distress E ENT: mild pallor, no  icterus. Cardiovascular: RRR, S1/S2 +, no rubs, no gallops, no edema Respiratory: CTA bilaterally, no wheezing, mild decreases in breath sounds with no significant rhonchi, rales or wheezing.  Abdominal: Soft, NT, ND, bowel sounds + Extremities: no edema, no cyanosis   The results of significant diagnostics from this hospitalization (including imaging, microbiology, ancillary and laboratory) are listed below for reference.     Microbiology: Recent Results (from the past 240 hour(s))  Blood culture (routine x 2)     Status: None   Collection Time: 06/28/17 10:51 AM  Result Value Ref Range Status   Specimen Description BLOOD  Final   Special Requests BLOOD  Final   Culture NO GROWTH 5 DAYS  Final   Report Status 07/03/2017 FINAL  Final  Blood culture (routine x 2)     Status: None   Collection Time: 06/28/17 10:53 AM  Result Value Ref Range Status   Specimen Description BLOOD RIGHT FOREARM  Final   Special Requests   Final    BOTTLES DRAWN AEROBIC AND ANAEROBIC Blood Culture adequate volume   Culture NO GROWTH 5 DAYS  Final   Report Status 07/03/2017 FINAL  Final     Labs: BNP (last 3 results)  Recent Labs  05/14/17 0018 06/22/17 2011 06/28/17 0909  BNP 55.0 187.0* 02.7   Basic Metabolic Panel:  Recent Labs Lab 06/29/17 0432 06/30/17 0429 07/01/17 0606 07/02/17 0639 07/03/17 0451 07/03/17 1147  NA 140 138 139 138 131*  --   K 5.1 4.0 3.7 5.1 5.1 4.7  CL 97* 93* 96* 95* 92*  --   CO2 35* 38* 36* 34* 32  --   GLUCOSE 263* 243* 249* 422* 460* 405*  BUN 24* 27* 18 22* 27*  --   CREATININE 0.81 0.84 0.74 0.80 0.93  --   CALCIUM 8.9 8.6* 8.6* 8.8* 8.8*  --    Liver Function Tests:  Recent Labs Lab 06/28/17 0909 06/29/17 0432  AST 11* 14*  ALT 20 18  ALKPHOS 67 62  BILITOT 0.9 0.6  PROT 5.5* 5.7*  ALBUMIN 2.8* 2.7*   No results for input(s): LIPASE, AMYLASE in the last 168 hours. No results for input(s): AMMONIA in the last 168 hours. CBC:  Recent  Labs Lab 06/28/17 0909 06/29/17 0432 06/30/17 0429 07/01/17 0606  WBC 13.3* 11.8* 9.8 6.1  NEUTROABS 11.9*  --  7.9* 4.3  HGB 12.9* 12.5* 12.4* 12.8*  HCT 41.6 38.8* 40.4 40.5  MCV 85.4 82.9 85.4 84.2  PLT 126* 174 159 160   Cardiac Enzymes:  Recent Labs Lab 06/28/17 0909  TROPONINI <0.03   BNP: Invalid input(s): POCBNP CBG:  Recent Labs Lab 07/02/17 1621 07/02/17 2012 07/03/17 0743 07/03/17 1113 07/03/17 1414  GLUCAP 291* 261* 467* 566* 265*   D-Dimer No results for input(s): DDIMER in the last 72 hours. Hgb A1c No results for input(s): HGBA1C in the last 72 hours. Lipid Profile No results for input(s): CHOL, HDL, LDLCALC, TRIG, CHOLHDL, LDLDIRECT in the last 72 hours. Thyroid function studies No results for input(s): TSH, T4TOTAL, T3FREE, THYROIDAB in the last 72 hours.  Invalid input(s):  FREET3 Anemia work up No results for input(s): VITAMINB12, FOLATE, FERRITIN, TIBC, IRON, RETICCTPCT in the last 72 hours. Urinalysis    Component Value Date/Time   COLORURINE AMBER (A) 05/05/2013 1621   APPEARANCEUR CLEAR 05/05/2013 1621   LABSPEC 1.025 05/05/2013 1621   PHURINE 5.5 05/05/2013 1621   GLUCOSEU NEGATIVE 05/05/2013 1621   HGBUR NEGATIVE 05/05/2013 1621   HGBUR negative 03/31/2009 1514   BILIRUBINUR SMALL (A) 05/05/2013 1621   KETONESUR NEGATIVE 05/05/2013 1621   PROTEINUR NEGATIVE 05/05/2013 1621   UROBILINOGEN 1.0 05/05/2013 1621   NITRITE NEGATIVE 05/05/2013 1621   LEUKOCYTESUR NEGATIVE 05/05/2013 1621   Sepsis Labs Invalid input(s): PROCALCITONIN,  WBC,  LACTICIDVEN Microbiology Recent Results (from the past 240 hour(s))  Blood culture (routine x 2)     Status: None   Collection Time: 06/28/17 10:51 AM  Result Value Ref Range Status   Specimen Description BLOOD  Final   Special Requests BLOOD  Final   Culture NO GROWTH 5 DAYS  Final   Report Status 07/03/2017 FINAL  Final  Blood culture (routine x 2)     Status: None   Collection Time:  06/28/17 10:53 AM  Result Value Ref Range Status   Specimen Description BLOOD RIGHT FOREARM  Final   Special Requests   Final    BOTTLES DRAWN AEROBIC AND ANAEROBIC Blood Culture adequate volume   Culture NO GROWTH 5 DAYS  Final   Report Status 07/03/2017 FINAL  Final     Time coordinating discharge: 45 minutes  SIGNED:   Tawni Millers, MD  [facility] 07/03/2017, 3:23 PM Pager [phone]  If 7PM-7AM, please contact night-coverage [email or web address] Password [email or web address]

## 2017-07-03 NOTE — Progress Notes (Addendum)
Physician Discharge Summary  Andrew [name]:[id number] DOB: 1940/09/07 DOA: 06/28/2017  PCP: [facility]  Admit date: 06/28/2017 Discharge date: 07/03/2017  Admitted From: Home Disposition:  Home  Recommendations for Outpatient Follow-up:  1. Follow up with PCP in 1- week 2. Patient has been placed on inhaled long acting beta 2 agonist and corticosteroid 3. Long acting inhaled anticholinergic daily. 4. 3 more days of systemic steroids. 5. 4 more days of Levofloxacin. 6. Pain control with as needed ibuprofen. 7. Furosemide increased to 40 mg daily. 8. Aspiration precautions with dysphagia 3 (mech soft) solids.  Home Health: Yes  Equipment/Devices: No   Discharge Condition: Stable CODE STATUS: Full  Diet recommendation: Heart healthy and diabetic prudent   Dysphagia 3 (Mech soft) solids;Thin liquid Liquid Administration via: Cup;No straw Medication Administration: Whole meds with liquid (or whole with puree) Supervision: Intermittent supervision to cue for compensatory strategies Compensations: Slow rate (avoid talking during meals, pt already SOB) Postural Changes: Remain semi-upright after after feeds/meals (Comment);Seated upright at 90 degrees  Brief/Interim Summary: This is a 76 year old male who presented with worsening dyspnea, to the point where he became symptomatic with minimal efforts. This was his fourth hospitalization since August 2018, alladmissions for decompensated respiratory failure. On initial physical examination, he was in respiratory distress, positive accessory muscle use, blood pressure 116/72, heart rate 86, respiratory 25, oxygen saturation 93% on 4 L nasal cannula. Dry mucous membranes, pale conjunctiva, lungs with diffuse rhonchi bilaterally and bibasilar rales, distant heart sounds, no S3 or S4 gallop, protuberant abdomen, significant pitting lower extremity edema +++. Sodium 139, potassium 4.2, chloride 97, bicarbonate 26, glucose 270,  BUN 12, creatinine 0.67, white count 13.3, hemoglobin 12.9, hematocrit 41.6, platelets 126.Chest x-ray with left lower lobe infiltrate, confirmed by CT chest. Increased vascular congestion, suggestive of pulmonary edema.   Patient will be admitted to hospital with working diagnosis of acute on chronic hypoxic respiratory failure due to healthcare associated pneumonia/aspiration pneumonia/left lower lobe, complicated by decompensated diastolic heart failure, acute on chronic.  1. Acute on chronic hypoxic respiratory failure due to aspiration pneumonia, left lower lobe, complicated by COPD exacerbation. Patient was admitted to the medical ward, he had oximetry monitoring and supplemental oxygen per nasal cannula. Antibiotic therapy with intravenous levofloxacin, aggressive bronchodilator therapy with DuoNeb, systemic steroids, and aspiration precautions. Patient had responded well to medical therapy, he was deemed to be stable to discharge on October 29. Patient will continue bronchodilator at home, albuterol per MDI and nebulizer, will place patient on long-acting inhaled beta 2 agonist, inhaled corticosteroids, and long-acting inhaled anticholinergic. Continue home oxygen supplementation. Patient show significant anxiety, exacerbating his dyspnea, as needed alprazolam prescribed with good response. Close follow-up as an outpatient. Discharged oximetry 93% on 3 L per nasal cannula.   2. Aspiration pneumonia. Patient will complete therapy with levofloxacin, aspiration precautions. Patient was evaluated by speech therapy, with recommendations for a dysphagia 3 diet.  3. Diastolic heart failure, acute on chronic, complicated by cardiogenic pulmonary edema. Patient was aggressively diuresed with IV furosemide, negative fluid balance was achieved with significant improvement of his symptoms. By the time of discharge he reached a negative balance of 7,042 ml. Continue furosemide at home, dose will be increased to  40 mg daily. Continue blood pressure control with diltiazem.   4. Paroxysmal atrial fibrillation. Patient was placed on a remote telemetry monitor, he remained sinus rhythm. Will continue diltiazem for rate control. Currently patient not on anticoagulation, will refer for outpatient follow-up.  5. Type  2 diabetes mellitus with hyperglycemia. Patient developed hyperglycemia likely related to his acute illness and systemic steroids, he required insulin short and long-acting for glucose control. Continue insulin sliding scale and insulin Levemir at home.   6. Dyslipidemia. Continue rosuvastatin.  7. Infrarenal aortic aneurysm 6.1 cm diameter. Continue follow-up as an outpatient, continue conservative therapy.   8. Tobacco abuse. Smoking cessation counseling, nicotine patch will be prescribed    Discharge Diagnoses:  Active Problems:   Pneumonia    Discharge Instructions   Allergies as of 07/03/2017      Reactions   Procaine Other (See Comments), Shortness Of Breath   Syncope   Procaine Hcl Anaphylaxis, Swelling   Atorvastatin Palpitations   REACTION: Heartburn      Medication List    STOP taking these medications   apixaban 2.5 MG Tabs tablet Commonly known as:  ELIQUIS   azithromycin 500 MG tablet Commonly known as:  ZITHROMAX   ipratropium-albuterol 0.5-2.5 (3) MG/3ML Soln Commonly known as:  DUONEB   nicotine 14 mg/24hr patch Commonly known as:  NICODERM CQ - dosed in mg/24 hours Replaced by:  nicotine 21 mg/24hr patch   potassium chloride 10 MEQ tablet Commonly known as:  K-DUR   senna-docusate 8.6-50 MG tablet Commonly known as:  Senokot-S     TAKE these medications   acetaminophen 650 MG CR tablet Commonly known as:  TYLENOL Take 650 mg by mouth every 8 (eight) hours as needed for pain.   albuterol (2.5 MG/3ML) 0.083% nebulizer solution Commonly known as:  PROVENTIL Take 3 mLs (2.5 mg total) by nebulization every 4 (four) hours as needed for wheezing  or shortness of breath.   albuterol 108 (90 Base) MCG/ACT inhaler Commonly known as:  PROVENTIL HFA;VENTOLIN HFA Inhale 2 puffs into the lungs every 4 (four) hours as needed for wheezing.   ALPRAZolam 0.25 MG tablet Commonly known as:  XANAX Take 1 tablet (0.25 mg total) by mouth 3 (three) times daily as needed for anxiety.   diltiazem 300 MG 24 hr capsule Commonly known as:  TIAZAC Take 1 capsule (300 mg total) by mouth daily.   Fluticasone-Salmeterol 500-50 MCG/DOSE Aepb Commonly known as:  ADVAIR DISKUS Inhale 1 puff into the lungs 2 (two) times daily.   furosemide 40 MG tablet Commonly known as:  LASIX Take 1 tablet (40 mg total) by mouth daily. What changed:  how much to take   ibuprofen 200 MG tablet Commonly known as:  ADVIL,MOTRIN Take 1 tablet (200 mg total) by mouth every 8 (eight) hours as needed (back pain).   insulin aspart 100 UNIT/ML FlexPen Commonly known as:  NOVOLOG Check CBG's there etimes daily before meals. For CBG less than 150: 0 Units For CBG 150-200: 2 Units SQ For CBG 200-250: 4 Units SQ For CBG 250-300: 6 Units SQ For CBG 300-350: 8 Units SQ and call PCP for further instructions   insulin glargine 100 unit/mL Sopn Commonly known as:  LANTUS Inject 0.25 mLs (25 Units total) into the skin daily.   levofloxacin 500 MG tablet Commonly known as:  LEVAQUIN Take 1 tablet (500 mg total) by mouth daily.   Melatonin 3 MG Tabs Take 1 tablet (3 mg total) by mouth at bedtime.   montelukast 10 MG tablet Commonly known as:  SINGULAIR Take 1 tablet (10 mg total) by mouth daily.   nicotine 21 mg/24hr patch Commonly known as:  NICODERM CQ - dosed in mg/24 hours Place 1 patch (21 mg total) onto the skin  daily. Replaces:  nicotine 14 mg/24hr patch   omeprazole 40 MG capsule Commonly known as:  PRILOSEC Take 40 mg by mouth daily.   predniSONE 10 MG tablet Commonly known as:  DELTASONE Take 1 tablet (10 mg total) by mouth daily with breakfast. What  changed:  medication strength  how much to take  how to take this  when to take this  additional instructions   rosuvastatin 20 MG tablet Commonly known as:  CRESTOR Take 1 tablet (20 mg total) by mouth daily.   tiotropium 18 MCG inhalation capsule Commonly known as:  SPIRIVA HANDIHALER Place 1 capsule (18 mcg total) into inhaler and inhale daily.   Vitamin B-12 2500 MCG Subl Place 1 tablet (2,500 mcg total) under the tongue daily.      Follow-up Information    [facility] Follow up.   Contact information: [place] [id number] [phone]          Allergies  Allergen Reactions  . Procaine Other (See Comments) and Shortness Of Breath    Syncope  . Procaine Hcl Anaphylaxis and Swelling  . Atorvastatin Palpitations    REACTION: Heartburn    Consultations:     Procedures/Studies: Dg Chest 1 View  Result Date: 06/29/2017 CLINICAL DATA:  Shortness of breath, atrial fib, CHF, COPD EXAM: CHEST 1 VIEW COMPARISON:  06/28/2017 FINDINGS: Vascular congestion. Heart is borderline in size. Left perihilar and lower lobe opacity again noted, unchanged. No effusions. IMPRESSION: Stable left lower lobe opacity concerning for pneumonia. Mild vascular congestion. Electronically Signed   By: Rolm Baptise M.D.   On: 06/29/2017 08:27   Ct Chest W Contrast  Result Date: 06/28/2017 CLINICAL DATA:  76 year old male with history of head congestive heart failure. Increasing shortness of breath. EXAM: CT CHEST WITH CONTRAST TECHNIQUE: Multidetector CT imaging of the chest was performed during intravenous contrast administration. CONTRAST:  55mL ISOVUE-300 IOPAMIDOL (ISOVUE-300) INJECTION 61% COMPARISON:  No priors. FINDINGS: Cardiovascular: Heart size is normal. There is no significant pericardial fluid, thickening or pericardial calcification. There is aortic atherosclerosis, as well as atherosclerosis of the great vessels of the mediastinum  and the coronary arteries, including calcified atherosclerotic plaque in the the main, left anterior descending, left circumflex and right coronary arteries. Calcifications of the aortic valve. Lipomatous hypertrophy of the interatrial septum. Mediastinum/Nodes: No pathologically enlarged mediastinal or hilar lymph nodes. Esophagus is unremarkable in appearance. No axillary lymphadenopathy. Lungs/Pleura: Diffuse bronchial wall thickening. Scattered areas of more severe thickening of the peribronchovascular interstitium with associated peribronchovascular micro and macronodularity. These findings are most severe in the inferior segment of the lingula and in the basal segments of the left lower lobe where there are confluent areas of nodularity and surrounding airspace consolidation. Mild centrilobular and paraseptal emphysema. Trace left pleural effusion lying dependently. Upper Abdomen: Aortic atherosclerosis. Musculoskeletal: There are no aggressive appearing lytic or blastic lesions noted in the visualized portions of the skeleton. IMPRESSION: 1. The appearance of the chest is most compatible with multilobar bronchopneumonia, potentially related to recent aspiration. At this time, there is extensive mucoid impaction and widespread airspace disease most evident in the left lower lobe and lingula. 2. Aortic atherosclerosis, in addition to left main and 3 vessel coronary artery disease. Please note that although the presence of coronary artery calcium documents the presence of coronary artery disease, the severity of this disease and any potential stenosis cannot be assessed on this non-gated CT examination. Assessment for potential risk factor modification, dietary therapy  or pharmacologic therapy may be warranted, if clinically indicated. 3. There are calcifications of the aortic valve. Echocardiographic correlation for evaluation of potential valvular dysfunction may be warranted if clinically indicated. 4. Mild  centrilobular and paraseptal emphysema. 5. Lipomatous hypertrophy of the interatrial septum. This can be associated with arrhythmias and some individuals. Aortic Atherosclerosis (ICD10-I70.0) and Emphysema (ICD10-J43.9). Electronically Signed   By: Vinnie Langton M.D.   On: 06/28/2017 10:59   Dg Chest Portable 1 View  Result Date: 06/28/2017 CLINICAL DATA:  Shortness of Breath EXAM: PORTABLE CHEST 1 VIEW COMPARISON:  June 22, 2017 FINDINGS: There is patchy airspace consolidation in the left base with small left pleural effusion. Lungs elsewhere clear. Heart is upper normal in size with pulmonary vascularity within normal limits. No adenopathy. Prominence in the right paratracheal region is felt to represent great vessel prominence in this age group. No bone lesions are evident. IMPRESSION: Left lower lobe airspace consolidation, likely pneumonia, with small left pleural effusion. Lungs elsewhere clear. Heart upper normal in size. Followup PA and lateral chest radiographs recommended in 3-4 weeks following trial of antibiotic therapy to ensure resolution and exclude underlying malignancy. Electronically Signed   By: Lowella Grip III M.D.   On: 06/28/2017 08:53   Dg Chest Portable 1 View  Result Date: 06/22/2017 CLINICAL DATA:  Shortness of breath. EXAM: PORTABLE CHEST 1 VIEW COMPARISON:  Chest x-ray dated May 13, 2017. FINDINGS: Stable mild cardiomegaly. Bibasilar opacities are largely unchanged. No large pleural effusion. No pneumothorax. No acute osseous abnormality. IMPRESSION: Largely unchanged bibasilar opacities, likely atelectasis. Stable cardiomegaly. Electronically Signed   By: Titus Dubin M.D.   On: 06/22/2017 20:47   Dg Swallowing Func-speech Pathology  Result Date: 06/29/2017 Objective Swallowing Evaluation: Type of Study: MBS-Modified Barium Swallow Study Patient Details Name: Andrew [name] MRN: [id number] Date of Birth: 1941/04/12 Today's Date: 06/29/2017 Time: SLP Start  Time (ACUTE ONLY): 1632-SLP Stop Time (ACUTE ONLY): 1700 SLP Time Calculation (min) (ACUTE ONLY): 28 min Past Medical History: Past Medical History: Diagnosis Date . A-fib ([place])  . Abdominal pain  . Adenomatous colon polyp  . Arthritis  . Asthma  . CAD (coronary artery disease)  . Cancer ([place])   skin, nose . Cataract   bilateral . CHF (congestive heart failure) ([place])   patient reports . Chronic LBP  . COPD (chronic obstructive pulmonary disease) ([place])  . Decreased libido  . Diverticulosis of colon  . Esophageal stricture  . Fatigue  . GERD (gastroesophageal reflux disease)  . History of small bowel obstruction  . Hyperlipidemia  . Hypertension  . Hypertrophy of prostate with urinary obstruction and other lower urinary tract symptoms (LUTS)  . Palpitations  . Peri-rectal abscess  . Personal history of renal calculi  . Prostatitis, acute  . Stroke [facility])   pt reports . Tobacco abuse  Past Surgical History: Past Surgical History: Procedure Laterality Date . CATARACT EXTRACTION  04/2010  bilateral . HERNIA REPAIR   . LUMBAR LAMINECTOMY    with Harrington rods . PENILE PROSTHESIS PLACEMENT   . TONSILLECTOMY   HPI: 76 year-old male with history of GERD, COPD and CHF who presented yesterday with SOB. Pt was hospitalized here from 06/23/17-06/25/17 for COPD exacerbation and was discharged home. He states that his breathing never did improve at that time. He was also hospitalized with respiratory failure requiring intubation in August. On presentation yesterday, pt had bibasilar crackles,LE edema and JVD. CXR with lower left lobe infiltratethat looks to be unchanged over the  past couple of months as well as pulmonary vascular congestion. Pt was admitted for treatment of acute on chronic hypoxic respiratory failure 2/2 HCAP/aspiration PNA, and acute on chronic decompensated diastolic heart failure. Pt had MBSS 10/31/2016 at [facility] while at [facility] for rehab (risk for aspiration due to COPD and SOB). This is his 4th hospitalization in  the past 6 months. MD requested swallowing evaluation due to suspected aspiration PNA. Subjective: "I need a Xanax." Assessment / Plan / Recommendation CHL IP CLINICAL IMPRESSIONS 06/29/2017 Clinical Impression Pt assessed with barium tinged thin, puree, regular, barium tablet, and nectar-thick liquids in the lateral position for MBSS. Pt visibly short of breath throughout the evaluation. Pt demonstrates mild oral phase dysphagia characterized by delayed oral transit with dry solids likely negatively impacted by shortness of breath; mild pharyngeal phase dysphagia characterized by premature spillage of liquids over base of tongue with swallow trigger at the level of the pyriforms for thin and nectar and decreased laryngeal vestibule closure resulting in penetration into laryngeal vestibule (variable trace to min amount) with thins and nectars during the swallow. Pt penetrated a greater amount when taking straw sips thin, nectars, and when taking pill with thin, however no aspiration was observed throughout the study. Esophageal sweep revealed adequate/timely transfer of barium tablet to stomach. Recommend D3/mech soft (encourage moist textures) and thin liquids and no straws. Pt should clear throat/cough periodically to ensure clearance of liquids from laryngeal vestibule and repeat/dry swallow. Pt would likely benefit from ongoing education regarding risks for aspiration and compensatory strategies.  SLP Visit Diagnosis Dysphagia, oropharyngeal phase (R13.12) Attention and concentration deficit following -- Frontal lobe and executive function deficit following -- Impact on safety and function Mild aspiration risk   CHL IP TREATMENT RECOMMENDATION 06/29/2017 Treatment Recommendations Therapy as outlined in treatment plan below   Prognosis 06/29/2017 Prognosis for Safe Diet Advancement Good Barriers to Reach Goals (No Data) Barriers/Prognosis Comment -- CHL IP DIET RECOMMENDATION 06/29/2017 SLP Diet Recommendations  Dysphagia 3 (Mech soft) solids;Thin liquid Liquid Administration via Cup;No straw Medication Administration Whole meds with liquid Compensations Slow rate Postural Changes Remain semi-upright after after feeds/meals (Comment);Seated upright at 90 degrees   CHL IP OTHER RECOMMENDATIONS 06/29/2017 Recommended Consults -- Oral Care Recommendations Oral care BID;Staff/trained caregiver to provide oral care Other Recommendations Clarify dietary restrictions   CHL IP FOLLOW UP RECOMMENDATIONS 06/29/2017 Follow up Recommendations None   CHL IP FREQUENCY AND DURATION 06/29/2017 Speech Therapy Frequency (ACUTE ONLY) min 2x/week Treatment Duration 1 week      CHL IP ORAL PHASE 06/29/2017 Oral Phase Impaired Oral - Pudding Teaspoon -- Oral - Pudding Cup -- Oral - Honey Teaspoon -- Oral - Honey Cup -- Oral - Nectar Teaspoon -- Oral - Nectar Cup -- Oral - Nectar Straw -- Oral - Thin Teaspoon -- Oral - Thin Cup -- Oral - Thin Straw -- Oral - Puree -- Oral - Mech Soft -- Oral - Regular Delayed oral transit;Impaired mastication Oral - Multi-Consistency -- Oral - Pill -- Oral Phase - Comment --  CHL IP PHARYNGEAL PHASE 06/29/2017 Pharyngeal Phase Impaired Pharyngeal- Pudding Teaspoon -- Pharyngeal -- Pharyngeal- Pudding Cup -- Pharyngeal -- Pharyngeal- Honey Teaspoon -- Pharyngeal -- Pharyngeal- Honey Cup -- Pharyngeal -- Pharyngeal- Nectar Teaspoon -- Pharyngeal -- Pharyngeal- Nectar Cup Delayed swallow initiation-pyriform sinuses;Reduced airway/laryngeal closure;Penetration/Aspiration during swallow Pharyngeal Material does not enter airway;Material enters airway, remains ABOVE vocal cords then ejected out Pharyngeal- Nectar Straw -- Pharyngeal -- Pharyngeal- Thin Teaspoon -- Pharyngeal -- Pharyngeal- Thin Cup [facility]  Pharyngeal -- Pharyngeal- Thin Straw Delayed swallow initiation-pyriform sinuses;Reduced airway/laryngeal closure;Penetration/Aspiration during swallow;Penetration/Apiration after swallow Pharyngeal Material does not  enter airway;Material enters airway, remains ABOVE vocal cords then ejected out;Material enters airway, remains ABOVE vocal cords and not ejected out Pharyngeal- Puree Delayed swallow initiation-vallecula Pharyngeal -- Pharyngeal- Mechanical Soft -- Pharyngeal -- Pharyngeal- Regular Delayed swallow initiation-vallecula Pharyngeal -- Pharyngeal- Multi-consistency -- Pharyngeal -- Pharyngeal- Pill Penetration/Aspiration before swallow;Delayed swallow initiation-vallecula Pharyngeal Material enters airway, remains ABOVE vocal cords then ejected out Pharyngeal Comment --  CHL IP CERVICAL ESOPHAGEAL PHASE 06/29/2017 Cervical Esophageal Phase WFL Pudding Teaspoon -- Pudding Cup -- Honey Teaspoon -- Honey Cup -- Nectar Teaspoon -- Nectar Cup -- Nectar Straw -- Thin Teaspoon -- Thin Cup -- Thin Straw -- Puree -- Mechanical Soft -- Regular -- Multi-consistency -- Pill -- Cervical Esophageal Comment -- CHL IP GO 10/31/2016 Functional Assessment Tool Used clinical judgment; MBSS Functional Limitations Swallowing Swallow Current Status (G9562) CJ Swallow Goal Status (Z3086) CJ Swallow Discharge Status (V7846) CJ Motor Speech Current Status (N6295) (None) Motor Speech Goal Status (M8413) (None) Motor Speech Goal Status (K4401) (None) Spoken Language Comprehension Current Status (U2725) (None) Spoken Language Comprehension Goal Status (D6644) (None) Spoken Language Comprehension Discharge Status (I3474) (None) Spoken Language Expression Current Status (Q5956) (None) Spoken Language Expression Goal Status (L8756) (None) Spoken Language Expression Discharge Status (E3329) (None) Attention Current Status (J1884) (None) Attention Goal Status (Z6606) (None) Attention Discharge Status (T0160) (None) Memory Current Status (F0932) (None) Memory Goal Status (T5573) (None) Memory Discharge Status (U2025) (None) Voice Current Status (K2706) (None) Voice Goal Status (C3762) (None) Voice Discharge Status (G3151) (None) Other Speech-Language  Pathology Functional Limitation Current Status (V6160) (None) Other Speech-Language Pathology Functional Limitation Goal Status (V3710) (None) Other Speech-Language Pathology Functional Limitation Discharge Status [phone]) (None) Thank you, Genene Churn, [place] 06/29/2017, 6:54 PM                  Subjective: Patient feeling better, dyspnea close to baseline, no nausea or vomiting, no chest pain.   Discharge Exam: Vitals:   07/03/17 0609 07/03/17 0750  BP: (!) 108/42   Pulse: 66   Resp: 16   Temp: 98.7 F (37.1 C)   SpO2: 93% (!) 89%   Vitals:   07/03/17 0003 07/03/17 0442 07/03/17 0609 07/03/17 0750  BP:   (!) 108/42   Pulse:   66   Resp:   16   Temp:   98.7 F (37.1 C)   TempSrc:   Oral   SpO2: 94% 92% 93% (!) 89%  Weight:   75.8 kg (167 lb 1.6 oz)   Height:   5\' 5"  (1.651 m)     General: Pt is alert, awake, not in acute distress E ENT: mild pallor, no icterus. Cardiovascular: RRR, S1/S2 +, no rubs, no gallops, no edema Respiratory: CTA bilaterally, no wheezing, mild decreases in breath sounds with no significant rhonchi, rales or wheezing.  Abdominal: Soft, NT, ND, bowel sounds + Extremities: no edema, no cyanosis    The results of significant diagnostics from this hospitalization (including imaging, microbiology, ancillary and laboratory) are listed below for reference.     Microbiology: Recent Results (from the past 240 hour(s))  Blood culture (routine x 2)     Status: None   Collection Time: 06/28/17 10:51 AM  Result Value Ref Range Status   Specimen Description BLOOD  Final   Special Requests BLOOD  Final   Culture NO GROWTH 5 DAYS  Final   Report  Status 07/03/2017 FINAL  Final  Blood culture (routine x 2)     Status: None   Collection Time: 06/28/17 10:53 AM  Result Value Ref Range Status   Specimen Description BLOOD RIGHT FOREARM  Final   Special Requests   Final    BOTTLES DRAWN AEROBIC AND ANAEROBIC Blood Culture  adequate volume   Culture NO GROWTH 5 DAYS  Final   Report Status 07/03/2017 FINAL  Final     Labs: BNP (last 3 results)  Recent Labs  05/14/17 0018 06/22/17 2011 06/28/17 0909  BNP 55.0 187.0* 63.0   Basic Metabolic Panel:  Recent Labs Lab 06/29/17 0432 06/30/17 0429 07/01/17 0606 07/02/17 0639 07/03/17 0451  NA 140 138 139 138 131*  K 5.1 4.0 3.7 5.1 5.1  CL 97* 93* 96* 95* 92*  CO2 35* 38* 36* 34* 32  GLUCOSE 263* 243* 249* 422* 460*  BUN 24* 27* 18 22* 27*  CREATININE 0.81 0.84 0.74 0.80 0.93  CALCIUM 8.9 8.6* 8.6* 8.8* 8.8*   Liver Function Tests:  Recent Labs Lab 06/28/17 0909 06/29/17 0432  AST 11* 14*  ALT 20 18  ALKPHOS 67 62  BILITOT 0.9 0.6  PROT 5.5* 5.7*  ALBUMIN 2.8* 2.7*   No results for input(s): LIPASE, AMYLASE in the last 168 hours. No results for input(s): AMMONIA in the last 168 hours. CBC:  Recent Labs Lab 06/28/17 0909 06/29/17 0432 06/30/17 0429 07/01/17 0606  WBC 13.3* 11.8* 9.8 6.1  NEUTROABS 11.9*  --  7.9* 4.3  HGB 12.9* 12.5* 12.4* 12.8*  HCT 41.6 38.8* 40.4 40.5  MCV 85.4 82.9 85.4 84.2  PLT 126* 174 159 160   Cardiac Enzymes:  Recent Labs Lab 06/28/17 0909  TROPONINI <0.03   BNP: Invalid input(s): POCBNP CBG:  Recent Labs Lab 07/02/17 0732 07/02/17 1133 07/02/17 1621 07/02/17 2012 07/03/17 0743  GLUCAP 377* 347* 291* 261* 467*   D-Dimer No results for input(s): DDIMER in the last 72 hours. Hgb A1c No results for input(s): HGBA1C in the last 72 hours. Lipid Profile No results for input(s): CHOL, HDL, LDLCALC, TRIG, CHOLHDL, LDLDIRECT in the last 72 hours. Thyroid function studies No results for input(s): TSH, T4TOTAL, T3FREE, THYROIDAB in the last 72 hours.  Invalid input(s): FREET3 Anemia work up No results for input(s): VITAMINB12, FOLATE, FERRITIN, TIBC, IRON, RETICCTPCT in the last 72 hours. Urinalysis    Component Value Date/Time   COLORURINE AMBER (A) 05/05/2013 1621   APPEARANCEUR  CLEAR 05/05/2013 1621   LABSPEC 1.025 05/05/2013 1621   PHURINE 5.5 05/05/2013 1621   GLUCOSEU NEGATIVE 05/05/2013 1621   HGBUR NEGATIVE 05/05/2013 1621   HGBUR negative 03/31/2009 1514   BILIRUBINUR SMALL (A) 05/05/2013 1621   KETONESUR NEGATIVE 05/05/2013 1621   PROTEINUR NEGATIVE 05/05/2013 1621   UROBILINOGEN 1.0 05/05/2013 1621   NITRITE NEGATIVE 05/05/2013 1621   LEUKOCYTESUR NEGATIVE 05/05/2013 1621   Sepsis Labs Invalid input(s): PROCALCITONIN,  WBC,  LACTICIDVEN Microbiology Recent Results (from the past 240 hour(s))  Blood culture (routine x 2)     Status: None   Collection Time: 06/28/17 10:51 AM  Result Value Ref Range Status   Specimen Description BLOOD  Final   Special Requests BLOOD  Final   Culture NO GROWTH 5 DAYS  Final   Report Status 07/03/2017 FINAL  Final  Blood culture (routine x 2)     Status: None   Collection Time: 06/28/17 10:53 AM  Result Value Ref Range Status   Specimen Description BLOOD  RIGHT FOREARM  Final   Special Requests   Final    BOTTLES DRAWN AEROBIC AND ANAEROBIC Blood Culture adequate volume   Culture NO GROWTH 5 DAYS  Final   Report Status 07/03/2017 FINAL  Final     Time coordinating discharge: 45 minutes  SIGNED:   Tawni Millers, MD  [facility] 07/03/2017, 8:57 AM Pager [phone]  If 7PM-7AM, please contact night-coverage [email or web address] Password [email or web address]

## 2017-07-03 NOTE — Progress Notes (Signed)
D/C information reviewed with patient and patient's daughter, both verbalized understanding, IV removed, patient tolerated Iv removal well, patient's daughter to transport patient home.

## 2017-07-03 NOTE — Progress Notes (Signed)
Patient's glucose 467.  Dr. Cathlean Sauer notified via text page.

## 2017-11-17 ENCOUNTER — Other Ambulatory Visit: Payer: Self-pay

## 2017-11-17 ENCOUNTER — Observation Stay (HOSPITAL_COMMUNITY)
Admission: EM | Admit: 2017-11-17 | Discharge: 2017-11-19 | Disposition: A | Discharge status: Home / Self Care | Payer: Medicare PPO | Attending: Internal Medicine | Admitting: Internal Medicine

## 2017-11-17 ENCOUNTER — Emergency Department (HOSPITAL_COMMUNITY): Payer: Medicare PPO

## 2017-11-17 ENCOUNTER — Encounter (HOSPITAL_COMMUNITY): Payer: Self-pay | Admitting: Emergency Medicine

## 2017-11-17 DIAGNOSIS — Z79899 Other long term (current) drug therapy: Secondary | ICD-10-CM | POA: Diagnosis not present

## 2017-11-17 DIAGNOSIS — I10 Essential (primary) hypertension: Secondary | ICD-10-CM | POA: Diagnosis present

## 2017-11-17 DIAGNOSIS — E119 Type 2 diabetes mellitus without complications: Secondary | ICD-10-CM | POA: Insufficient documentation

## 2017-11-17 DIAGNOSIS — Z794 Long term (current) use of insulin: Secondary | ICD-10-CM | POA: Diagnosis not present

## 2017-11-17 DIAGNOSIS — J9621 Acute and chronic respiratory failure with hypoxia: Secondary | ICD-10-CM | POA: Diagnosis not present

## 2017-11-17 DIAGNOSIS — I2510 Atherosclerotic heart disease of native coronary artery without angina pectoris: Secondary | ICD-10-CM | POA: Diagnosis not present

## 2017-11-17 DIAGNOSIS — R0603 Acute respiratory distress: Secondary | ICD-10-CM

## 2017-11-17 DIAGNOSIS — I714 Abdominal aortic aneurysm, without rupture: Secondary | ICD-10-CM | POA: Diagnosis not present

## 2017-11-17 DIAGNOSIS — I5032 Chronic diastolic (congestive) heart failure: Secondary | ICD-10-CM | POA: Insufficient documentation

## 2017-11-17 DIAGNOSIS — I4891 Unspecified atrial fibrillation: Secondary | ICD-10-CM | POA: Diagnosis present

## 2017-11-17 DIAGNOSIS — Z8673 Personal history of transient ischemic attack (TIA), and cerebral infarction without residual deficits: Secondary | ICD-10-CM | POA: Diagnosis not present

## 2017-11-17 DIAGNOSIS — J441 Chronic obstructive pulmonary disease with (acute) exacerbation: Principal | ICD-10-CM | POA: Diagnosis present

## 2017-11-17 DIAGNOSIS — I110 Hypertensive heart disease with heart failure: Secondary | ICD-10-CM | POA: Diagnosis not present

## 2017-11-17 DIAGNOSIS — R0602 Shortness of breath: Secondary | ICD-10-CM | POA: Diagnosis present

## 2017-11-17 DIAGNOSIS — F17200 Nicotine dependence, unspecified, uncomplicated: Secondary | ICD-10-CM | POA: Diagnosis present

## 2017-11-17 DIAGNOSIS — F17210 Nicotine dependence, cigarettes, uncomplicated: Secondary | ICD-10-CM | POA: Diagnosis not present

## 2017-11-17 LAB — CBC WITH DIFFERENTIAL/PLATELET
BASOS ABS: 0 10*3/uL (ref 0.0–0.1)
BASOS PCT: 0 %
Eosinophils Absolute: 0.1 10*3/uL (ref 0.0–0.7)
Eosinophils Relative: 1 %
HEMATOCRIT: 45.5 % (ref 39.0–52.0)
HEMOGLOBIN: 13 g/dL (ref 13.0–17.0)
LYMPHS PCT: 17 %
Lymphs Abs: 1.5 10*3/uL (ref 0.7–4.0)
MCH: 24.9 pg — ABNORMAL LOW (ref 26.0–34.0)
MCHC: 28.6 g/dL — ABNORMAL LOW (ref 30.0–36.0)
MCV: 87 fL (ref 78.0–100.0)
Monocytes Absolute: 0.8 10*3/uL (ref 0.1–1.0)
Monocytes Relative: 8 %
NEUTROS ABS: 6.9 10*3/uL (ref 1.7–7.7)
NEUTROS PCT: 74 %
Platelets: 261 10*3/uL (ref 150–400)
RBC: 5.23 MIL/uL (ref 4.22–5.81)
RDW: 16.3 % — ABNORMAL HIGH (ref 11.5–15.5)
WBC: 9.3 10*3/uL (ref 4.0–10.5)

## 2017-11-17 LAB — I-STAT CG4 LACTIC ACID, ED: Lactic Acid, Venous: 1.39 mmol/L (ref 0.5–1.9)

## 2017-11-17 LAB — BLOOD GAS, ARTERIAL
Acid-Base Excess: 15 mmol/L — ABNORMAL HIGH (ref 0.0–2.0)
BICARBONATE: 36.4 mmol/L — AB (ref 20.0–28.0)
Drawn by: 331001
FIO2: 21
O2 SAT: 81.6 %
PATIENT TEMPERATURE: 98.8
PCO2 ART: 63.1 mmHg — AB (ref 32.0–48.0)
PO2 ART: 47.1 mmHg — AB (ref 83.0–108.0)
pH, Arterial: 7.422 (ref 7.350–7.450)

## 2017-11-17 LAB — GLUCOSE, CAPILLARY: Glucose-Capillary: 415 mg/dL — ABNORMAL HIGH (ref 65–99)

## 2017-11-17 LAB — CBG MONITORING, ED
GLUCOSE-CAPILLARY: 53 mg/dL — AB (ref 65–99)
GLUCOSE-CAPILLARY: 106 mg/dL — AB (ref 65–99)
GLUCOSE-CAPILLARY: 407 mg/dL — AB (ref 65–99)
Glucose-Capillary: 306 mg/dL — ABNORMAL HIGH (ref 65–99)
Glucose-Capillary: 398 mg/dL — ABNORMAL HIGH (ref 65–99)

## 2017-11-17 LAB — COMPREHENSIVE METABOLIC PANEL
ALT: 11 U/L — AB (ref 17–63)
AST: 14 U/L — AB (ref 15–41)
Albumin: 3.1 g/dL — ABNORMAL LOW (ref 3.5–5.0)
Alkaline Phosphatase: 75 U/L (ref 38–126)
Anion gap: 11 (ref 5–15)
BILIRUBIN TOTAL: 0.5 mg/dL (ref 0.3–1.2)
BUN: 10 mg/dL (ref 6–20)
CO2: 37 mmol/L — ABNORMAL HIGH (ref 22–32)
CREATININE: 0.74 mg/dL (ref 0.61–1.24)
Calcium: 9 mg/dL (ref 8.9–10.3)
Chloride: 95 mmol/L — ABNORMAL LOW (ref 101–111)
GFR calc Af Amer: >60 mL/min (ref >60)
Glucose, Bld: 54 mg/dL — ABNORMAL LOW (ref 65–99)
POTASSIUM: 3.5 mmol/L (ref 3.5–5.1)
Sodium: 143 mmol/L (ref 135–145)
TOTAL PROTEIN: 6.6 g/dL (ref 6.5–8.1)

## 2017-11-17 LAB — LACTIC ACID, PLASMA: Lactic Acid, Venous: 1.9 mmol/L (ref 0.5–1.9)

## 2017-11-17 LAB — BRAIN NATRIURETIC PEPTIDE: B Natriuretic Peptide: 87 pg/mL (ref 0.0–100.0)

## 2017-11-17 LAB — TROPONIN I

## 2017-11-17 MED ORDER — SODIUM CHLORIDE 0.9% FLUSH
3.0000 mL | Freq: Two times a day (BID) | INTRAVENOUS | Status: DC
  Administered 2017-11-17 – 2017-11-19 (×4): 3 mL via INTRAVENOUS

## 2017-11-17 MED ORDER — ALBUTEROL SULFATE (2.5 MG/3ML) 0.083% IN NEBU
5.0000 mg | INHALATION_SOLUTION | Freq: Once | RESPIRATORY_TRACT | Status: AC
  Administered 2017-11-17: 5 mg via RESPIRATORY_TRACT
  Filled 2017-11-17: qty 6

## 2017-11-17 MED ORDER — FUROSEMIDE 10 MG/ML IJ SOLN
40.0000 mg | Freq: Two times a day (BID) | INTRAMUSCULAR | Status: DC
  Administered 2017-11-17 – 2017-11-19 (×4): 40 mg via INTRAVENOUS
  Filled 2017-11-17 (×4): qty 4

## 2017-11-17 MED ORDER — ALPRAZOLAM 0.25 MG PO TABS
0.2500 mg | ORAL_TABLET | Freq: Three times a day (TID) | ORAL | Status: DC | PRN
  Administered 2017-11-18: 0.25 mg via ORAL
  Filled 2017-11-17: qty 1

## 2017-11-17 MED ORDER — FUROSEMIDE 10 MG/ML IJ SOLN
40.0000 mg | INTRAMUSCULAR | Status: AC
  Administered 2017-11-17: 40 mg via INTRAVENOUS
  Filled 2017-11-17: qty 4

## 2017-11-17 MED ORDER — ONDANSETRON HCL 4 MG PO TABS: 4.0000 mg | ORAL_TABLET | Freq: Four times a day (QID) | ORAL | Status: DC | PRN

## 2017-11-17 MED ORDER — SODIUM CHLORIDE 0.9% FLUSH
3.0000 mL | INTRAVENOUS | Status: DC | PRN
  Administered 2017-11-17: 3 mL via INTRAVENOUS
  Filled 2017-11-17: qty 3

## 2017-11-17 MED ORDER — ENOXAPARIN SODIUM 40 MG/0.4ML ~~LOC~~ SOLN
40.0000 mg | SUBCUTANEOUS | Status: DC
  Administered 2017-11-17 – 2017-11-18 (×2): 40 mg via SUBCUTANEOUS
  Filled 2017-11-17 (×2): qty 0.4

## 2017-11-17 MED ORDER — ROSUVASTATIN CALCIUM 20 MG PO TABS
20.0000 mg | ORAL_TABLET | Freq: Every day | ORAL | Status: DC
  Administered 2017-11-18: 20 mg via ORAL
  Filled 2017-11-17: qty 1

## 2017-11-17 MED ORDER — MELATONIN 3 MG PO TABS
1.0000 | ORAL_TABLET | Freq: Every day | ORAL | Status: DC
  Filled 2017-11-17 (×3): qty 1

## 2017-11-17 MED ORDER — SODIUM CHLORIDE 0.9 % IV SOLN: 250.0000 mL | INTRAVENOUS | Status: DC | PRN

## 2017-11-17 MED ORDER — METHYLPREDNISOLONE SODIUM SUCC 125 MG IJ SOLR
60.0000 mg | Freq: Four times a day (QID) | INTRAMUSCULAR | Status: DC
  Administered 2017-11-17 – 2017-11-18 (×3): 60 mg via INTRAVENOUS
  Filled 2017-11-17 (×4): qty 2

## 2017-11-17 MED ORDER — MONTELUKAST SODIUM 10 MG PO TABS
10.0000 mg | ORAL_TABLET | Freq: Every day | ORAL | Status: DC
  Administered 2017-11-18 – 2017-11-19 (×2): 10 mg via ORAL
  Filled 2017-11-17 (×3): qty 1

## 2017-11-17 MED ORDER — DIPHENHYDRAMINE HCL 25 MG PO CAPS
25.0000 mg | ORAL_CAPSULE | Freq: Every day | ORAL | Status: DC
  Administered 2017-11-17 – 2017-11-18 (×2): 25 mg via ORAL
  Filled 2017-11-17 (×2): qty 1

## 2017-11-17 MED ORDER — DILTIAZEM HCL ER BEADS 300 MG PO CP24
300.0000 mg | ORAL_CAPSULE | Freq: Every day | ORAL | Status: DC
  Administered 2017-11-18 – 2017-11-19 (×2): 300 mg via ORAL
  Filled 2017-11-17 (×2): qty 1

## 2017-11-17 MED ORDER — ONDANSETRON HCL 4 MG/2ML IJ SOLN: 4.0000 mg | Freq: Four times a day (QID) | INTRAMUSCULAR | Status: DC | PRN

## 2017-11-17 MED ORDER — INSULIN GLARGINE 100 UNIT/ML ~~LOC~~ SOLN
10.0000 [IU] | Freq: Every day | SUBCUTANEOUS | Status: DC
  Administered 2017-11-18 – 2017-11-19 (×2): 10 [IU] via SUBCUTANEOUS
  Filled 2017-11-17 (×3): qty 0.1

## 2017-11-17 MED ORDER — ACETAMINOPHEN 650 MG RE SUPP: 650.0000 mg | Freq: Four times a day (QID) | RECTAL | Status: DC | PRN

## 2017-11-17 MED ORDER — IPRATROPIUM-ALBUTEROL 0.5-2.5 (3) MG/3ML IN SOLN
3.0000 mL | Freq: Four times a day (QID) | RESPIRATORY_TRACT | Status: DC
  Administered 2017-11-17 – 2017-11-19 (×7): 3 mL via RESPIRATORY_TRACT
  Filled 2017-11-17 (×7): qty 3

## 2017-11-17 MED ORDER — BUDESONIDE 0.25 MG/2ML IN SUSP
0.2500 mg | Freq: Two times a day (BID) | RESPIRATORY_TRACT | Status: DC
  Administered 2017-11-17 – 2017-11-19 (×4): 0.25 mg via RESPIRATORY_TRACT
  Filled 2017-11-17 (×4): qty 2

## 2017-11-17 MED ORDER — INSULIN ASPART 100 UNIT/ML ~~LOC~~ SOLN
0.0000 [IU] | Freq: Every day | SUBCUTANEOUS | Status: DC
  Administered 2017-11-17: 5 [IU] via SUBCUTANEOUS
  Administered 2017-11-18: 4 [IU] via SUBCUTANEOUS

## 2017-11-17 MED ORDER — INSULIN ASPART 100 UNIT/ML ~~LOC~~ SOLN
0.0000 [IU] | Freq: Three times a day (TID) | SUBCUTANEOUS | Status: DC
  Administered 2017-11-17: 15 [IU] via SUBCUTANEOUS
  Administered 2017-11-18: 8 [IU] via SUBCUTANEOUS
  Administered 2017-11-18: 5 [IU] via SUBCUTANEOUS
  Administered 2017-11-18: 8 [IU] via SUBCUTANEOUS
  Administered 2017-11-19: 11 [IU] via SUBCUTANEOUS
  Filled 2017-11-17: qty 1

## 2017-11-17 MED ORDER — DEXTROSE 50 % IV SOLN: 0.5000 | Freq: Once | INTRAVENOUS | Status: DC

## 2017-11-17 MED ORDER — PANTOPRAZOLE SODIUM 40 MG PO TBEC
40.0000 mg | DELAYED_RELEASE_TABLET | Freq: Every day | ORAL | Status: DC
  Administered 2017-11-18 – 2017-11-19 (×2): 40 mg via ORAL
  Filled 2017-11-17 (×2): qty 1

## 2017-11-17 MED ORDER — ACETAMINOPHEN 325 MG PO TABS: 650.0000 mg | ORAL_TABLET | Freq: Four times a day (QID) | ORAL | Status: DC | PRN

## 2017-11-17 MED ORDER — METHYLPREDNISOLONE SODIUM SUCC 125 MG IJ SOLR
125.0000 mg | Freq: Once | INTRAMUSCULAR | Status: AC
  Administered 2017-11-17: 125 mg via INTRAVENOUS
  Filled 2017-11-17: qty 2

## 2017-11-17 MED ORDER — ALBUTEROL (5 MG/ML) CONTINUOUS INHALATION SOLN
10.0000 mg/h | INHALATION_SOLUTION | RESPIRATORY_TRACT | Status: DC
  Administered 2017-11-17: 10 mg/h via RESPIRATORY_TRACT
  Filled 2017-11-17: qty 20

## 2017-11-17 MED ORDER — DM-GUAIFENESIN ER 30-600 MG PO TB12
1.0000 | ORAL_TABLET | Freq: Two times a day (BID) | ORAL | Status: DC
  Administered 2017-11-17 – 2017-11-19 (×4): 1 via ORAL
  Filled 2017-11-17 (×4): qty 1

## 2017-11-17 NOTE — ED Provider Notes (Signed)
[facility] EMERGENCY DEPARTMENT Provider Note   CSN: [id number] Arrival date & time: 11/17/17  1153     History   Chief Complaint Chief Complaint  Patient presents with  . Shortness of Breath    HPI Andrew [name] is a 77 y.o. male.  HPI  77 y/o male - hx of CHF (grade 1 dia with 60% EF), and COPD - on O2 at night only - increased SOB and cough for 3 weeks, worse over several days - creamy sputum, increased edema, sx are severe and not improving with nebs and alb treatment at home - lives with daughter - caregiver.    Level 5 caveat applies to acute resp distress  Past Medical History:  Diagnosis Date  . A-fib ([place])   . Abdominal pain   . Adenomatous colon polyp   . Arthritis   . Asthma   . CAD (coronary artery disease)   . Cancer ([place])    skin, nose  . Cataract    bilateral  . CHF (congestive heart failure) ([place])    patient reports  . Chronic LBP   . COPD (chronic obstructive pulmonary disease) ([place])   . Decreased libido   . Diverticulosis of colon   . Esophageal stricture   . Fatigue   . GERD (gastroesophageal reflux disease)   . History of small bowel obstruction   . Hyperlipidemia   . Hypertension   . Hypertrophy of prostate with urinary obstruction and other lower urinary tract symptoms (LUTS)   . Palpitations   . Peri-rectal abscess   . Personal history of renal calculi   . Prostatitis, acute   . Stroke [facility])    pt reports  . Tobacco abuse     Patient Active Problem List   Diagnosis Date Noted  . Hospital acquired PNA 05/14/2017  . COPD exacerbation ([place])   . HCAP (healthcare-associated pneumonia)   . Acute respiratory failure with hypoxia and hypercapnia ([place]) 04/12/2017  . Pneumonia 10/24/2016  . AAA (abdominal aortic aneurysm) ([employer]) 01/04/2011  . Dizziness, nonspecific 12/26/2010  . UNSPECIFIED PERIPHERAL VASCULAR DISEASE 09/23/2010  . LEG CRAMPS 09/13/2010  . Diabetes mellitus ([place]) 02/11/2010  . CHRONIC OBSTRUCTIVE PULMONARY DISEASE,  ACUTE EXACERBATION 02/11/2010  . OLECRANON BURSITIS, RIGHT 01/26/2010  . PENILE PAIN 12/29/2009  . ABNORMAL EJACULATION 05/05/2009  . LARYNGITIS, ACUTE 04/21/2009  . ATRIAL FIBRILLATION 04/08/2009  . PALPITATIONS 04/08/2009  . FATIGUE 03/31/2009  . LIBIDO, DECREASED 03/31/2009  . HYPERTROPHY PROSTATE W/UR OBST & OTH LUTS 03/25/2009  . ACUTE PROSTATITIS 03/25/2009  . SMALL BOWEL OBSTRUCTION, HX OF 11/28/2007  . Hyperlipidemia 11/13/2007  . COPD 10/02/2007  . LOW BACK PAIN, CHRONIC 10/02/2007  . ADENOMATOUS COLONIC POLYP 05/22/2007  . ESOPHAGEAL STRICTURE 05/22/2007  . DIVERTICULOSIS, COLON 05/22/2007  . TOBACCO ABUSE 05/08/2007  . Essential hypertension 05/08/2007  . CORONARY ARTERY DISEASE 05/08/2007  . ASTHMA 05/08/2007  . GERD 05/08/2007  . RENAL CALCULUS, HX OF 05/08/2007  . ABSCESS, PERIRECTAL, HX OF 05/08/2007  . ARTHRITIS, HX OF 05/08/2007  . PENILE PROSTHESIS 05/08/2007  . LAMINECTOMY, LUMBAR, HX OF 05/08/2007    Past Surgical History:  Procedure Laterality Date  . CATARACT EXTRACTION  04/2010   bilateral  . HERNIA REPAIR    . LUMBAR LAMINECTOMY     with Harrington rods  . PENILE PROSTHESIS PLACEMENT    . TONSILLECTOMY         Home Medications    Prior to Admission medications   Medication Sig Start Date End  Date Taking? Authorizing Provider  acetaminophen (TYLENOL) 650 MG CR tablet Take 650 mg by mouth every 8 (eight) hours as needed for pain.   Yes [provider]  albuterol (PROVENTIL HFA;VENTOLIN HFA) 108 (90 Base) MCG/ACT inhaler Inhale 2 puffs into the lungs every 4 (four) hours as needed for wheezing. 07/03/17 11/17/17 Yes Arrien, Jimmy Picket, MD  albuterol (PROVENTIL) (2.5 MG/3ML) 0.083% nebulizer solution Take 3 mLs (2.5 mg total) by nebulization every 4 (four) hours as needed for wheezing or shortness of breath. 07/03/17  Yes Arrien, Jimmy Picket, MD  ALPRAZolam Duanne Moron) 0.25 MG tablet Take 1 tablet (0.25 mg total) by mouth 3 (three)  times daily as needed for anxiety. 07/03/17  Yes Arrien, Jimmy Picket, MD  diltiazem [facility]) 300 MG 24 hr capsule Take 1 capsule (300 mg total) by mouth daily. 07/03/17 11/17/17 Yes Arrien, Jimmy Picket, MD  Fluticasone-Salmeterol (ADVAIR DISKUS) 500-50 MCG/DOSE AEPB Inhale 1 puff into the lungs 2 (two) times daily. 07/03/17 11/17/17 Yes Arrien, Jimmy Picket, MD  furosemide (LASIX) 40 MG tablet Take 1 tablet (40 mg total) by mouth daily. 07/03/17  Yes Arrien, Jimmy Picket, MD  glipiZIDE (GLUCOTROL XL) 2.5 MG 24 hr tablet Take 1 tablet by mouth daily. 10/03/16  Yes [provider]  ibuprofen (ADVIL,MOTRIN) 200 MG tablet Take 1 tablet (200 mg total) by mouth every 8 (eight) hours as needed (back pain). 07/03/17  Yes Arrien, Jimmy Picket, MD  insulin aspart (NOVOLOG) 100 UNIT/ML FlexPen Check CBG's there etimes daily before meals. For CBG less than 150: 0 Units For CBG 150-200: 2 Units SQ For CBG 200-250: 4 Units SQ For CBG 250-300: 6 Units SQ For CBG 300-350: 8 Units SQ and call PCP for further instructions 07/03/17  Yes Arrien, Jimmy Picket, MD  insulin glargine (LANTUS) 100 unit/mL SOPN Inject 0.25 mLs (25 Units total) into the skin daily. Patient taking differently: Inject 10 Units into the skin daily.  07/03/17 11/17/17 Yes Arrien, Jimmy Picket, MD  montelukast (SINGULAIR) 10 MG tablet Take 1 tablet (10 mg total) by mouth daily. 07/03/17 11/17/17 Yes Arrien, Jimmy Picket, MD  omeprazole (PRILOSEC) 40 MG capsule Take 40 mg by mouth daily.   Yes [provider]  rosuvastatin (CRESTOR) 20 MG tablet Take 1 tablet (20 mg total) by mouth daily. 07/03/17  Yes Arrien, Jimmy Picket, MD  tiotropium (SPIRIVA HANDIHALER) 18 MCG inhalation capsule Place 1 capsule (18 mcg total) into inhaler and inhale daily. 07/03/17 07/03/18 Yes Arrien, Jimmy Picket, MD  diphenhydrAMINE (BENADRYL) 25 MG tablet Take 1 tablet by mouth at bedtime.    [provider]    nicotine (NICODERM CQ - DOSED IN MG/24 HOURS) 21 mg/24hr patch Place 1 patch (21 mg total) onto the skin daily. Patient not taking: Reported on 11/17/2017 07/03/17   Arrien, Jimmy Picket, MD    Family History Family History  Problem Relation Age of Onset  . Heart disease Brother        CAD  . Diabetes Brother   . COPD Brother   . Emphysema Brother     Social History Social History   Tobacco Use  . Smoking status: Current Every Day Smoker    Packs/day: 1.00    Types: Cigarettes  . Smokeless tobacco: Never Used  Substance Use Topics  . Alcohol use: No  . Drug use: No     Allergies   Procaine and Procaine hcl   Review of Systems Review of Systems  Unable to perform ROS: Acuity of condition  Physical Exam Updated Vital Signs BP 123/61 Comment: on RA  Pulse 72   Temp 98.8 F (37.1 C) (Oral)   Resp 15   Ht 5\' 5"  (1.651 m)   Wt 79.4 kg (175 lb)   SpO2 96%   BMI 29.12 kg/m   Physical Exam  Constitutional: He appears well-developed and well-nourished. He appears distressed.  HENT:  Head: Normocephalic and atraumatic.  Mouth/Throat: No oropharyngeal exudate.  Tongue red, no exudate, dry  Eyes: Conjunctivae and EOM are normal. Pupils are equal, round, and reactive to light. Right eye exhibits no discharge. Left eye exhibits no discharge. No scleral icterus.  Neck: Normal range of motion. Neck supple. No JVD present. No thyromegaly present.  Cardiovascular: Normal rate and intact distal pulses. Exam reveals no gallop and no friction rub.  No murmur heard. Supraventricular bigemin -  Distant heart sounds, n oobvious murmurs Good pulses, no obvious JVD Significant bil LE edema at ankles  Pulmonary/Chest: No stridor. He is in respiratory distress. He has wheezes. He has rales. He exhibits no tenderness.  Abdominal: Soft. Bowel sounds are normal. He exhibits no distension and no mass. There is no tenderness.  Reducible umbilical hernia  Musculoskeletal:  Normal range of motion. He exhibits edema. He exhibits no tenderness.  Lymphadenopathy:    He has no cervical adenopathy.  Neurological: He is alert. Coordination normal.  Skin: Skin is warm and dry. No rash noted. No erythema.  Psychiatric: He has a normal mood and affect. His behavior is normal.  Nursing note and vitals reviewed.    ED Treatments / Results  Labs (all labs ordered are listed, but only abnormal results are displayed) Labs Reviewed  CBC WITH DIFFERENTIAL/PLATELET - Abnormal; Notable for the following components:      Result Value   MCH 24.9 (*)    MCHC 28.6 (*)    RDW 16.3 (*)    All other components within normal limits  COMPREHENSIVE METABOLIC PANEL - Abnormal; Notable for the following components:   Chloride 95 (*)    CO2 37 (*)    Glucose, Bld 54 (*)    Albumin 3.1 (*)    AST 14 (*)    ALT 11 (*)    All other components within normal limits  CBG MONITORING, ED - Abnormal; Notable for the following components:   Glucose-Capillary 53 (*)    All other components within normal limits  CBG MONITORING, ED - Abnormal; Notable for the following components:   Glucose-Capillary 106 (*)    All other components within normal limits  CBG MONITORING, ED - Abnormal; Notable for the following components:   Glucose-Capillary 306 (*)    All other components within normal limits  CULTURE, BLOOD (ROUTINE X 2)  CULTURE, BLOOD (ROUTINE X 2)  TROPONIN I  BRAIN NATRIURETIC PEPTIDE  BLOOD GAS, ARTERIAL  LACTIC ACID, PLASMA  I-STAT CG4 LACTIC ACID, ED    EKG  EKG Interpretation  Date/Time:  Friday November 17 2017 12:24:49 EDT Ventricular Rate:  91 PR Interval:    QRS Duration: 102 QT Interval:  408 QTC Calculation: 447 R Axis:   92 Text Interpretation:  Sinus rhythm Supraventricular bigeminy Right axis deviation Since last tracing ectopy more frequent Confirmed by Noemi Chapel [phone]) on 11/17/2017 12:33:19 PM       Radiology Dg Chest Port 1 View  Result Date:  11/17/2017 CLINICAL DATA:  Shortness of breath. EXAM: PORTABLE CHEST 1 VIEW COMPARISON:  10/08/2017. FINDINGS: Cardiomegaly with mild pulmonary venous congestion. Improved  aeration both lung bases compared to prior chest x-ray of 10/08/2017. Persistent mild bibasilar atelectasis. No pleural effusion or pneumothorax. Stable scratched it no acute bony abnormality. IMPRESSION: 1. Improved aeration both lung bases compared to prior chest x-ray of 10/08/2017. Persistent mild bibasilar atelectasis. 2.  Stable cardiomegaly with mild pulmonary venous congestion. Electronically Signed   By: Marcello Moores  Register   On: 11/17/2017 12:46    Procedures .Critical Care Performed by: Noemi Chapel, MD Authorized by: Noemi Chapel, MD   Critical care provider statement:    Critical care time (minutes):  35   Critical care time was exclusive of:  Separately billable procedures and treating other patients and teaching time   Critical care was necessary to treat or prevent imminent or life-threatening deterioration of the following conditions:  Respiratory failure   Critical care was time spent personally by me on the following activities:  Blood draw for specimens, development of treatment plan with patient or surrogate, discussions with consultants, evaluation of patient's response to treatment, examination of patient, obtaining history from patient or surrogate, ordering and performing treatments and interventions, ordering and review of laboratory studies, ordering and review of radiographic studies, pulse oximetry, re-evaluation of patient's condition and review of old charts   (including critical care time)  Medications Ordered in ED Medications  dextrose 50 % solution 25 mL (0 mLs Intravenous Hold 11/17/17 1341)  albuterol (PROVENTIL,VENTOLIN) solution continuous neb (10 mg/hr Nebulization New Bag/Given 11/17/17 1414)  albuterol (PROVENTIL) (2.5 MG/3ML) 0.083% nebulizer solution 5 mg (5 mg Nebulization Given 11/17/17  1226)  furosemide (LASIX) injection 40 mg (40 mg Intravenous Given 11/17/17 1244)  methylPREDNISolone sodium succinate (SOLU-MEDROL) 125 mg/2 mL injection 125 mg (125 mg Intravenous Given 11/17/17 1244)     Initial Impression / Assessment and Plan / ED Course  I have reviewed the triage vital signs and the nursing notes.  Pertinent labs & imaging results that were available during my care of the patient were reviewed by me and considered in my medical decision making (see chart for details).  Clinical Course as of Nov 18 1454  Fri Nov 17, 2017  1330 Glucose: (!) 54 [BM]  1330 B Natriuretic Peptide: 87.0 [BM]  1330 Troponin I: <0.03 [BM]  1330 Labs with hypoglycemia - no other acute findings - normal trop, normal BNP, normal WBC WBC: 9.3 [BM]  1331 CXR without acute infiltrates - "improved" from prior.  [BM]  1331 Due to ongoing respiratory distress, it does raise concern for other cause of SOB - COPD being most likely given his exam.  His LE swelling is at the ankles and symmetrical.  Continue continuous nebs, admit for COPD if no signfiicant improvement  [BM]    Clinical Course User Index [BM] Noemi Chapel, MD    Pt has ongoign resp distress despite continuous = neesd more nebs Admitted to hosptialist - to high level of care CXR without obviosu pneumonia.  Final Clinical Impressions(s) / ED Diagnoses   Final diagnoses:  COPD exacerbation ([place])  Respiratory distress      Noemi Chapel, MD 11/17/17 1456

## 2017-11-17 NOTE — ED Notes (Signed)
I notified Andrew [name] with respiratory for neb tx for pt, respiratory has not given neb tx yet.

## 2017-11-17 NOTE — H&P (Signed)
History and Physical    Andrew [name] JHE:[id number] DOB: Jul 26, 1941 DOA: 11/17/2017  PCP: [facility]   Patient coming from: Home  Chief Complaint: Worsening dyspnea  HPI: Andrew [name] is a 77 y.o. male with medical history significant for CHF with grade 1 diastolic dysfunction and EF 60%, diabetes, hypertension, atrial fibrillation, and COPD with ongoing tobacco abuse who presented to the emergency department with his daughter who is his primary caregiver on account of worsening shortness of breath and cough over the last 3 weeks.  There has been associated lower extremity edema bilaterally that has also been worsening and he is also noted to have whitish sputum production on occasion.  He states that he is more short of breath on exertion and denies any shortness of breath with laying flat.  He actually does not wear home oxygen and will on occasion use his daughter's oxygen to help with symptoms.  He has tried breathing treatments at home with no relief. Denies any chest pain, diaphoresis, nausea, vomiting, or palpitations. He denies any fever or chills.  No rhinorrhea or significant chest congestion.   ED Course: Vital signs are noted to be stable and laboratory data is unremarkable.  There is no leukocytosis.  BNP is 87.  Chest x-ray demonstrates stable cardiomegaly with no signs of pulmonary edema or infiltrate.  He has been given a breathing treatment as well as IV steroids and 1 dose of IV Lasix with 550 mL of urine output noted.  He states that he is already feeling somewhat better at this time.  EKG with sinus rhythm and no ischemic changes noted.  Review of Systems: All others reviewed and otherwise negative.  Past Medical History:  Diagnosis Date  . A-fib ([place])   . Abdominal pain   . Adenomatous colon polyp   . Arthritis   . Asthma   . CAD (coronary artery disease)   . Cancer ([place])    skin, nose  . Cataract    bilateral  . CHF (congestive heart failure) ([place])    patient reports  . Chronic LBP   . COPD (chronic obstructive pulmonary disease) ([place])   . Decreased libido   . Diverticulosis of colon   . Esophageal stricture   . Fatigue   . GERD (gastroesophageal reflux disease)   . History of small bowel obstruction   . Hyperlipidemia   . Hypertension   . Hypertrophy of prostate with urinary obstruction and other lower urinary tract symptoms (LUTS)   . Palpitations   . Peri-rectal abscess   . Personal history of renal calculi   . Prostatitis, acute   . Stroke [facility])    pt reports  . Tobacco abuse     Past Surgical History:  Procedure Laterality Date  . CATARACT EXTRACTION  04/2010   bilateral  . HERNIA REPAIR    . LUMBAR LAMINECTOMY     with Harrington rods  . PENILE PROSTHESIS PLACEMENT    . TONSILLECTOMY       reports that he has been smoking cigarettes.  He has been smoking about 1.00 pack per day. he has never used smokeless tobacco. He reports that he does not drink alcohol or use drugs.  Allergies  Allergen Reactions  . Procaine Other (See Comments) and Shortness Of Breath    Syncope  . Procaine Hcl Anaphylaxis and Swelling    Family History  Problem Relation Age of Onset  . Heart disease Brother  CAD  . Diabetes Brother   . COPD Brother   . Emphysema Brother     Prior to Admission medications   Medication Sig Start Date End Date Taking? Authorizing Provider  acetaminophen (TYLENOL) 650 MG CR tablet Take 650 mg by mouth every 8 (eight) hours as needed for pain.   Yes [provider]  albuterol (PROVENTIL HFA;VENTOLIN HFA) 108 (90 Base) MCG/ACT inhaler Inhale 2 puffs into the lungs every 4 (four) hours as needed for wheezing. 07/03/17 11/17/17 Yes Arrien, Jimmy Picket, MD  albuterol (PROVENTIL) (2.5 MG/3ML) 0.083% nebulizer solution Take 3 mLs (2.5 mg total) by nebulization every 4 (four) hours as needed for wheezing or shortness of breath. 07/03/17  Yes Arrien, Jimmy Picket, MD  ALPRAZolam Duanne Moron)  0.25 MG tablet Take 1 tablet (0.25 mg total) by mouth 3 (three) times daily as needed for anxiety. 07/03/17  Yes Arrien, Jimmy Picket, MD  diltiazem [facility]) 300 MG 24 hr capsule Take 1 capsule (300 mg total) by mouth daily. 07/03/17 11/17/17 Yes Arrien, Jimmy Picket, MD  Fluticasone-Salmeterol (ADVAIR DISKUS) 500-50 MCG/DOSE AEPB Inhale 1 puff into the lungs 2 (two) times daily. 07/03/17 11/17/17 Yes Arrien, Jimmy Picket, MD  furosemide (LASIX) 40 MG tablet Take 1 tablet (40 mg total) by mouth daily. 07/03/17  Yes Arrien, Jimmy Picket, MD  glipiZIDE (GLUCOTROL XL) 5 MG 24 hr tablet Take 2.5 mg by mouth daily.  10/03/16  Yes [provider]  ibuprofen (ADVIL,MOTRIN) 200 MG tablet Take 1 tablet (200 mg total) by mouth every 8 (eight) hours as needed (back pain). 07/03/17  Yes Arrien, Jimmy Picket, MD  insulin aspart (NOVOLOG) 100 UNIT/ML FlexPen Check CBG's there etimes daily before meals. For CBG less than 150: 0 Units For CBG 150-200: 2 Units SQ For CBG 200-250: 4 Units SQ For CBG 250-300: 6 Units SQ For CBG 300-350: 8 Units SQ and call PCP for further instructions 07/03/17  Yes Arrien, Jimmy Picket, MD  insulin glargine (LANTUS) 100 unit/mL SOPN Inject 0.25 mLs (25 Units total) into the skin daily. Patient taking differently: Inject 10 Units into the skin daily.  07/03/17 11/17/17 Yes Arrien, Jimmy Picket, MD  Melatonin 3 MG TABS Take 1 tablet by mouth at bedtime.   Yes [provider]  montelukast (SINGULAIR) 10 MG tablet Take 1 tablet (10 mg total) by mouth daily. 07/03/17 11/17/17 Yes Arrien, Jimmy Picket, MD  omeprazole (PRILOSEC) 40 MG capsule Take 40 mg by mouth daily.   Yes [provider]  rosuvastatin (CRESTOR) 20 MG tablet Take 1 tablet (20 mg total) by mouth daily. 07/03/17  Yes Arrien, Jimmy Picket, MD  tiotropium (SPIRIVA HANDIHALER) 18 MCG inhalation capsule Place 1 capsule (18 mcg total) into inhaler and inhale daily. 07/03/17  07/03/18 Yes Arrien, Jimmy Picket, MD  diphenhydrAMINE (BENADRYL) 25 MG tablet Take 1 tablet by mouth at bedtime.    [provider]  nicotine (NICODERM CQ - DOSED IN MG/24 HOURS) 21 mg/24hr patch Place 1 patch (21 mg total) onto the skin daily. Patient not taking: Reported on 11/17/2017 07/03/17   Arrien, Jimmy Picket, MD    Physical Exam: Vitals:   11/17/17 1425 11/17/17 1426 11/17/17 1430 11/17/17 1445  BP:   122/64   Pulse: 69 72 70 73  Resp: 13 15    Temp:      TempSrc:      SpO2: 96% 96% 97% 96%  Weight:      Height:        Constitutional: NAD,  calm, comfortable Vitals:   11/17/17 1425 11/17/17 1426 11/17/17 1430 11/17/17 1445  BP:   122/64   Pulse: 69 72 70 73  Resp: 13 15    Temp:      TempSrc:      SpO2: 96% 96% 97% 96%  Weight:      Height:       Eyes: lids and conjunctivae normal ENMT: Mucous membranes are moist.  Neck: normal, supple Respiratory: clear to auscultation bilaterally with minimal wheezing. Normal respiratory effort. No accessory muscle use.  Currently receiving breathing treatment on 2 L nasal cannula. Cardiovascular: Regular rate and rhythm, no murmurs.  1-2+ pitting edema bilaterally. Abdomen: no tenderness, no distention. Bowel sounds positive.  Musculoskeletal:  No joint deformity upper and lower extremities.   Skin: no rashes, lesions, ulcers.  Psychiatric: Normal judgment and insight. Alert and oriented x 3. Normal mood.   Labs on Admission: I have personally reviewed following labs and imaging studies  CBC: Recent Labs  Lab 11/17/17 1216  WBC 9.3  NEUTROABS 6.9  HGB 13.0  HCT 45.5  MCV 87.0  PLT 161   Basic Metabolic Panel: Recent Labs  Lab 11/17/17 1231  NA 143  K 3.5  CL 95*  CO2 37*  GLUCOSE 54*  BUN 10  CREATININE 0.74  CALCIUM 9.0   GFR: Estimated Creatinine Clearance: 73.9 mL/min (by C-G formula based on SCr of 0.74 mg/dL). Liver Function Tests: Recent Labs  Lab 11/17/17 1231  AST 14*  ALT  11*  ALKPHOS 75  BILITOT 0.5  PROT 6.6  ALBUMIN 3.1*   No results for input(s): LIPASE, AMYLASE in the last 168 hours. No results for input(s): AMMONIA in the last 168 hours. Coagulation Profile: No results for input(s): INR, PROTIME in the last 168 hours. Cardiac Enzymes: Recent Labs  Lab 11/17/17 1231  TROPONINI <0.03   BNP (last 3 results) No results for input(s): PROBNP in the last 8760 hours. HbA1C: No results for input(s): HGBA1C in the last 72 hours. CBG: Recent Labs  Lab 11/17/17 1306 11/17/17 1334 11/17/17 1454  GLUCAP 53* 106* 306*   Lipid Profile: No results for input(s): CHOL, HDL, LDLCALC, TRIG, CHOLHDL, LDLDIRECT in the last 72 hours. Thyroid Function Tests: No results for input(s): TSH, T4TOTAL, FREET4, T3FREE, THYROIDAB in the last 72 hours. Anemia Panel: No results for input(s): VITAMINB12, FOLATE, FERRITIN, TIBC, IRON, RETICCTPCT in the last 72 hours. Urine analysis:    Component Value Date/Time   COLORURINE AMBER (A) 05/05/2013 1621   APPEARANCEUR CLEAR 05/05/2013 1621   LABSPEC 1.025 05/05/2013 1621   PHURINE 5.5 05/05/2013 1621   GLUCOSEU NEGATIVE 05/05/2013 1621   HGBUR NEGATIVE 05/05/2013 1621   HGBUR negative 03/31/2009 1514   BILIRUBINUR SMALL (A) 05/05/2013 1621   KETONESUR NEGATIVE 05/05/2013 1621   PROTEINUR NEGATIVE 05/05/2013 1621   UROBILINOGEN 1.0 05/05/2013 1621   NITRITE NEGATIVE 05/05/2013 1621   LEUKOCYTESUR NEGATIVE 05/05/2013 1621    Radiological Exams on Admission: Dg Chest Port 1 View  Result Date: 11/17/2017 CLINICAL DATA:  Shortness of breath. EXAM: PORTABLE CHEST 1 VIEW COMPARISON:  10/08/2017. FINDINGS: Cardiomegaly with mild pulmonary venous congestion. Improved aeration both lung bases compared to prior chest x-ray of 10/08/2017. Persistent mild bibasilar atelectasis. No pleural effusion or pneumothorax. Stable scratched it no acute bony abnormality. IMPRESSION: 1. Improved aeration both lung bases compared to prior  chest x-ray of 10/08/2017. Persistent mild bibasilar atelectasis. 2.  Stable cardiomegaly with mild pulmonary venous congestion. Electronically Signed  By: [place]   On: 11/17/2017 12:46    EKG: Independently reviewed. SR at 91bpm with ectopy.  Assessment/Plan Principal Problem:   Acute on chronic respiratory failure with hypoxia (HCC) Active Problems:   Diabetes mellitus ([place])   TOBACCO ABUSE   Essential hypertension   ATRIAL FIBRILLATION   COPD exacerbation ([place])    1. Acute hypoxemic respiratory failure likely secondary to COPD exacerbation.  Will place on duo nebs as well as Pulmicort and IV steroids.  I do not feel that he has a significant heart failure component noted, but he is volume overloaded and thus will place on Lasix 40 mg IV twice daily and monitor strict I's and O's and daily weights. 2. Chronic CHF with grade 1 diastolic dysfunction.  Obtain 2D echocardiogram for further evaluation. 3. Diabetes with labile blood glucose readings.  Sliding scale insulin while on steroids in the inpatient setting.  Continue home regimen. 4. Atrial fibrillation.  Continue home Cardizem.  He is currently in sinus rhythm and does not appear to be on any anticoagulation.  Monitor on telemetry. 5. Hypertension.  Continue on Cardizem and monitor closely. 6. Tobacco abuse.  Nicotine patch.   DVT prophylaxis: Lovenox Code Status: Full Family Communication: Daughter at bedside Disposition Plan:Treat COPD exacerbation and hypoxemia; DC once stable Consults called:None Admission status: Obs, tele   Bahar Shelden Darleen Crocker DO [facility] Pager [phone]  If 7PM-7AM, please contact night-coverage [email or web address] Password [facility]  11/17/2017, 3:18 PM

## 2017-11-17 NOTE — ED Notes (Signed)
Sats 86% on RA, placed on 2L, sats increased to 90-91%.

## 2017-11-17 NOTE — Progress Notes (Signed)
Pt had been taken off o2 for ABG per room air abg order. SPo2 79-80% while drawing ABG, prior to continuous neb, pt was placed back on 3L and rose to 90%.

## 2017-11-17 NOTE — ED Triage Notes (Signed)
Pt c/o dyspnea at rest and increasing bilateral lower extremity edema x 1 day. Hx of CHF. Pt wears oxygen while sleeping. Sats 86% on RA.

## 2017-11-17 NOTE — ED Notes (Signed)
Pt ate 100% of meal tray 

## 2017-11-17 NOTE — Progress Notes (Addendum)
ABG obtained at 1409 but unable to put results in the computer at the moment. Results called to [place], RN and will attempt to place in computer when able. Ph 7.422, Co2 63.1, po2 47.1, HCO3 36.4, So2 81.6, Base e of 15.0.

## 2017-11-17 NOTE — ED Notes (Signed)
Respiratory called and stated unable to put in results for ABG and will put in a note in chart, will try later to enter results at another time

## 2017-11-18 ENCOUNTER — Other Ambulatory Visit: Payer: Self-pay

## 2017-11-18 DIAGNOSIS — J9621 Acute and chronic respiratory failure with hypoxia: Secondary | ICD-10-CM | POA: Diagnosis not present

## 2017-11-18 LAB — CBC
HCT: 41.8 % (ref 39.0–52.0)
HEMOGLOBIN: 12.3 g/dL — AB (ref 13.0–17.0)
MCH: 25.1 pg — AB (ref 26.0–34.0)
MCHC: 29.4 g/dL — AB (ref 30.0–36.0)
MCV: 85.1 fL (ref 78.0–100.0)
Platelets: 262 10*3/uL (ref 150–400)
RBC: 4.91 MIL/uL (ref 4.22–5.81)
RDW: 15.7 % — AB (ref 11.5–15.5)
WBC: 11.6 10*3/uL — ABNORMAL HIGH (ref 4.0–10.5)

## 2017-11-18 LAB — GLUCOSE, CAPILLARY
GLUCOSE-CAPILLARY: 372 mg/dL — AB (ref 65–99)
GLUCOSE-CAPILLARY: 277 mg/dL — AB (ref 65–99)
Glucose-Capillary: 256 mg/dL — ABNORMAL HIGH (ref 65–99)
Glucose-Capillary: 222 mg/dL — ABNORMAL HIGH (ref 65–99)
Glucose-Capillary: 325 mg/dL — ABNORMAL HIGH (ref 65–99)

## 2017-11-18 LAB — BASIC METABOLIC PANEL
Anion gap: 12 (ref 5–15)
BUN: 15 mg/dL (ref 6–20)
CALCIUM: 8.9 mg/dL (ref 8.9–10.3)
CO2: 34 mmol/L — ABNORMAL HIGH (ref 22–32)
CREATININE: 0.73 mg/dL (ref 0.61–1.24)
Chloride: 92 mmol/L — ABNORMAL LOW (ref 101–111)
GFR calc Af Amer: >60 mL/min (ref >60)
GLUCOSE: 287 mg/dL — AB (ref 65–99)
Potassium: 3.8 mmol/L (ref 3.5–5.1)
Sodium: 138 mmol/L (ref 135–145)

## 2017-11-18 MED ORDER — ALBUTEROL SULFATE (2.5 MG/3ML) 0.083% IN NEBU
2.5000 mg | INHALATION_SOLUTION | RESPIRATORY_TRACT | Status: DC | PRN
  Administered 2017-11-18 (×2): 2.5 mg via RESPIRATORY_TRACT
  Administered 2017-11-19: 5 mg via RESPIRATORY_TRACT
  Filled 2017-11-18 (×4): qty 3

## 2017-11-18 MED ORDER — ALUM & MAG HYDROXIDE-SIMETH 200-200-20 MG/5ML PO SUSP
30.0000 mL | Freq: Four times a day (QID) | ORAL | Status: DC | PRN
  Administered 2017-11-18 (×2): 30 mL via ORAL
  Filled 2017-11-18 (×3): qty 30

## 2017-11-18 MED ORDER — METHYLPREDNISOLONE SODIUM SUCC 125 MG IJ SOLR
60.0000 mg | Freq: Two times a day (BID) | INTRAMUSCULAR | Status: DC
  Administered 2017-11-18 – 2017-11-19 (×2): 60 mg via INTRAVENOUS
  Filled 2017-11-18 (×2): qty 2

## 2017-11-18 NOTE — Progress Notes (Signed)
PROGRESS NOTE    Andrew [name]  JXB:[id number] DOB: [birth year] DOA: 11/17/2017 [facility]   Brief Narrative:   Andrew [name] is a 77 y.o. male with medical history significant for CHF with grade 1 diastolic dysfunction and EF 60%, diabetes, hypertension, atrial fibrillation, and COPD with ongoing tobacco abuse who presented to the emergency department with his daughter who is his primary caregiver on account of worsening shortness of breath and cough over the last 3 weeks.    He is admitted for acute hypoxemic respiratory failure with multifactorial cause of some volume overload and COPD exacerbation.  Assessment & Plan:   Principal Problem:   Acute on chronic respiratory failure with hypoxia (HCC) Active Problems:   Diabetes mellitus (HCC)   TOBACCO ABUSE   Essential hypertension   ATRIAL FIBRILLATION   COPD exacerbation (HCC)   COPD with exacerbation ([place])   1. Acute hypoxemic respiratory failure likely secondary to COPD exacerbation.  Will place on duo nebs as well as Pulmicort and IV steroids.  I do not feel that he has a significant heart failure component noted, but he is volume overloaded and thus will continue on Lasix 40 mg IV twice daily and monitor strict I's and O's and daily weights. 2. Chronic CHF with grade 1 diastolic dysfunction.  Obtain 2D echocardiogram for further evaluation-pending. 3. Diabetes with labile blood glucose readings.  Sliding scale insulin while on steroids in the inpatient setting.  Continue home regimen. 4. Atrial fibrillation.  Continue home Cardizem.  He is currently in sinus rhythm and does not appear to be on any anticoagulation.  Monitor on telemetry. 5. Hypertension.  Continue on Cardizem and monitor closely. 6. Tobacco abuse.  Nicotine patch.    DVT prophylaxis: Lovenox Code Status: Full Family Communication: We will communicate with daughter over phone Disposition Plan: Continue diuresis and pulmonary  treatments   Consultants:   None  Procedures:   None  Antimicrobials:   None   Subjective: Patient seen and evaluated today and continues to complain of shortness of breath and chest congestion with minimal to no improvement noted as of yet.  He continues to diurese fairly well and has had no other acute events overnight.  Objective: Vitals:   11/18/17 0130 11/18/17 0500 11/18/17 0839 11/18/17 1154  BP:  122/77    Pulse:  85    Resp:  17    Temp:  98.9 F (37.2 C)    TempSrc:  Oral    SpO2: 96% 91% 92% 93%  Weight:  74.9 kg (165 lb 2 oz)    Height:        Intake/Output Summary (Last 24 hours) at 11/18/2017 1207 Last data filed at 11/18/2017 1036 Gross per 24 hour  Intake 246 ml  Output 2775 ml  Net -2529 ml   Filed Weights   11/17/17 1207 11/17/17 2138 11/18/17 0500  Weight: 79.4 kg (175 lb) 74.7 kg (164 lb 10.9 oz) 74.9 kg (165 lb 2 oz)    Examination:  General exam: Appears calm and comfortable  Respiratory system: Clear to auscultation. Respiratory effort normal. On 2L [place]. Cardiovascular system: S1 & S2 heard, RRR. No JVD, murmurs, rubs, gallops or clicks. No pedal edema. Gastrointestinal system: Abdomen is nondistended, soft and nontender. No organomegaly or masses felt. Normal bowel sounds heard. Central nervous system: Alert and oriented. No focal neurological deficits. Extremities: Symmetric 5 x 5 power. Skin: No rashes, lesions or ulcers Psychiatry: Judgement and insight appear normal. Mood &  affect appropriate.     Data Reviewed: I have personally reviewed following labs and imaging studies  CBC: Recent Labs  Lab 11/17/17 1216 11/18/17 0747  WBC 9.3 11.6*  NEUTROABS 6.9  --   HGB 13.0 12.3*  HCT 45.5 41.8  MCV 87.0 85.1  PLT 261 527   Basic Metabolic Panel: Recent Labs  Lab 11/17/17 1231 11/18/17 0747  NA 143 138  K 3.5 3.8  CL 95* 92*  CO2 37* 34*  GLUCOSE 54* 287*  BUN 10 15  CREATININE 0.74 0.73  CALCIUM 9.0 8.9    GFR: Estimated Creatinine Clearance: 72 mL/min (by C-G formula based on SCr of 0.73 mg/dL). Liver Function Tests: Recent Labs  Lab 11/17/17 1231  AST 14*  ALT 11*  ALKPHOS 75  BILITOT 0.5  PROT 6.6  ALBUMIN 3.1*   No results for input(s): LIPASE, AMYLASE in the last 168 hours. No results for input(s): AMMONIA in the last 168 hours. Coagulation Profile: No results for input(s): INR, PROTIME in the last 168 hours. Cardiac Enzymes: Recent Labs  Lab 11/17/17 1231  TROPONINI <0.03   BNP (last 3 results) No results for input(s): PROBNP in the last 8760 hours. HbA1C: No results for input(s): HGBA1C in the last 72 hours. CBG: Recent Labs  Lab 11/17/17 1721 11/17/17 2110 11/17/17 2152 11/18/17 0245 11/18/17 0814  GLUCAP 407* 398* 415* 372* 256*   Lipid Profile: No results for input(s): CHOL, HDL, LDLCALC, TRIG, CHOLHDL, LDLDIRECT in the last 72 hours. Thyroid Function Tests: No results for input(s): TSH, T4TOTAL, FREET4, T3FREE, THYROIDAB in the last 72 hours. Anemia Panel: No results for input(s): VITAMINB12, FOLATE, FERRITIN, TIBC, IRON, RETICCTPCT in the last 72 hours. Sepsis Labs: Recent Labs  Lab 11/17/17 1237 11/17/17 1426  LATICACIDVEN 1.39 1.9    Recent Results (from the past 240 hour(s))  Blood culture (routine x 2)     Status: None (Preliminary result)   Collection Time: 11/17/17  1:01 PM  Result Value Ref Range Status   Specimen Description BLOOD BLOOD LEFT HAND  Final   Special Requests   Final    BOTTLES DRAWN AEROBIC AND ANAEROBIC Blood Culture adequate volume   Culture   Final    NO GROWTH < 24 HOURS Performed at [facility], [address], [place], [place] [address]    Report Status PENDING  Incomplete  Blood culture (routine x 2)     Status: None (Preliminary result)   Collection Time: 11/17/17  1:02 PM  Result Value Ref Range Status   Specimen Description BLOOD BLOOD LEFT HAND  Final   Special Requests   Final    BOTTLES DRAWN AEROBIC  AND ANAEROBIC Blood Culture adequate volume   Culture   Final    NO GROWTH < 24 HOURS Performed at [facility], [address], [place], [place] [address]    Report Status PENDING  Incomplete         Radiology Studies: Dg Chest Port 1 View  Result Date: 11/17/2017 CLINICAL DATA:  Shortness of breath. EXAM: PORTABLE CHEST 1 VIEW COMPARISON:  10/08/2017. FINDINGS: Cardiomegaly with mild pulmonary venous congestion. Improved aeration both lung bases compared to prior chest x-ray of 10/08/2017. Persistent mild bibasilar atelectasis. No pleural effusion or pneumothorax. Stable scratched it no acute bony abnormality. IMPRESSION: 1. Improved aeration both lung bases compared to prior chest x-ray of 10/08/2017. Persistent mild bibasilar atelectasis. 2.  Stable cardiomegaly with mild pulmonary venous congestion. Electronically Signed   By: Marcello Moores  Register  On: 11/17/2017 12:46        Scheduled Meds: . budesonide (PULMICORT) nebulizer solution  0.25 mg Nebulization BID  . dextromethorphan-guaiFENesin  1 tablet Oral BID  . diltiazem  300 mg Oral Daily  . diphenhydrAMINE  25 mg Oral QHS  . enoxaparin (LOVENOX) injection  40 mg Subcutaneous Q24H  . furosemide  40 mg Intravenous Q12H  . insulin aspart  0-15 Units Subcutaneous TID WC  . insulin aspart  0-5 Units Subcutaneous QHS  . insulin glargine  10 Units Subcutaneous Daily  . ipratropium-albuterol  3 mL Nebulization Q6H  . Melatonin  1 tablet Oral QHS  . methylPREDNISolone (SOLU-MEDROL) injection  60 mg Intravenous Q12H  . montelukast  10 mg Oral Daily  . pantoprazole  40 mg Oral Daily  . rosuvastatin  20 mg Oral q1800  . sodium chloride flush  3 mL Intravenous Q12H   Continuous Infusions: . sodium chloride       LOS: 0 days    Time spent: 30 minutes    [place] Darleen Crocker, DO [facility] Pager [phone]  If 7PM-7AM, please contact night-coverage [email or web address] Password [facility] 11/18/2017, 12:07 PM

## 2017-11-19 ENCOUNTER — Other Ambulatory Visit: Payer: Self-pay

## 2017-11-19 ENCOUNTER — Encounter (HOSPITAL_COMMUNITY): Payer: Self-pay | Admitting: *Deleted

## 2017-11-19 ENCOUNTER — Emergency Department (HOSPITAL_COMMUNITY)
Admission: EM | Admit: 2017-11-19 | Discharge: 2017-11-20 | Disposition: A | Discharge status: Home / Self Care | Payer: Medicare PPO | Source: Home / Self Care | Attending: Emergency Medicine | Admitting: Emergency Medicine

## 2017-11-19 DIAGNOSIS — Y33XXXA Other specified events, undetermined intent, initial encounter: Secondary | ICD-10-CM | POA: Insufficient documentation

## 2017-11-19 DIAGNOSIS — T18128A Food in esophagus causing other injury, initial encounter: Secondary | ICD-10-CM

## 2017-11-19 DIAGNOSIS — I110 Hypertensive heart disease with heart failure: Secondary | ICD-10-CM

## 2017-11-19 DIAGNOSIS — Z794 Long term (current) use of insulin: Secondary | ICD-10-CM

## 2017-11-19 DIAGNOSIS — Y998 Other external cause status: Secondary | ICD-10-CM | POA: Insufficient documentation

## 2017-11-19 DIAGNOSIS — I2510 Atherosclerotic heart disease of native coronary artery without angina pectoris: Secondary | ICD-10-CM

## 2017-11-19 DIAGNOSIS — Y9389 Activity, other specified: Secondary | ICD-10-CM | POA: Insufficient documentation

## 2017-11-19 DIAGNOSIS — I509 Heart failure, unspecified: Secondary | ICD-10-CM

## 2017-11-19 DIAGNOSIS — Y929 Unspecified place or not applicable: Secondary | ICD-10-CM

## 2017-11-19 DIAGNOSIS — J9621 Acute and chronic respiratory failure with hypoxia: Secondary | ICD-10-CM | POA: Diagnosis not present

## 2017-11-19 DIAGNOSIS — F17210 Nicotine dependence, cigarettes, uncomplicated: Secondary | ICD-10-CM

## 2017-11-19 DIAGNOSIS — Z8673 Personal history of transient ischemic attack (TIA), and cerebral infarction without residual deficits: Secondary | ICD-10-CM | POA: Insufficient documentation

## 2017-11-19 DIAGNOSIS — E119 Type 2 diabetes mellitus without complications: Secondary | ICD-10-CM | POA: Insufficient documentation

## 2017-11-19 DIAGNOSIS — E785 Hyperlipidemia, unspecified: Secondary | ICD-10-CM | POA: Insufficient documentation

## 2017-11-19 DIAGNOSIS — Z79899 Other long term (current) drug therapy: Secondary | ICD-10-CM | POA: Insufficient documentation

## 2017-11-19 DIAGNOSIS — I4891 Unspecified atrial fibrillation: Secondary | ICD-10-CM | POA: Insufficient documentation

## 2017-11-19 DIAGNOSIS — J449 Chronic obstructive pulmonary disease, unspecified: Secondary | ICD-10-CM | POA: Insufficient documentation

## 2017-11-19 LAB — BASIC METABOLIC PANEL
Anion gap: 13 (ref 5–15)
BUN: 25 mg/dL — AB (ref 6–20)
CHLORIDE: 93 mmol/L — AB (ref 101–111)
CO2: 34 mmol/L — ABNORMAL HIGH (ref 22–32)
Calcium: 9.1 mg/dL (ref 8.9–10.3)
Creatinine, Ser: 0.85 mg/dL (ref 0.61–1.24)
GFR calc Af Amer: >60 mL/min (ref >60)
GLUCOSE: 256 mg/dL — AB (ref 65–99)
POTASSIUM: 3.8 mmol/L (ref 3.5–5.1)
Sodium: 140 mmol/L (ref 135–145)

## 2017-11-19 LAB — CBC
HCT: 44.1 % (ref 39.0–52.0)
HEMOGLOBIN: 13.1 g/dL (ref 13.0–17.0)
MCH: 25.4 pg — AB (ref 26.0–34.0)
MCHC: 29.7 g/dL — ABNORMAL LOW (ref 30.0–36.0)
MCV: 85.5 fL (ref 78.0–100.0)
Platelets: 288 10*3/uL (ref 150–400)
RBC: 5.16 MIL/uL (ref 4.22–5.81)
RDW: 15.8 % — ABNORMAL HIGH (ref 11.5–15.5)
WBC: 17.7 10*3/uL — ABNORMAL HIGH (ref 4.0–10.5)

## 2017-11-19 LAB — GLUCOSE, CAPILLARY
GLUCOSE-CAPILLARY: 349 mg/dL — AB (ref 65–99)
GLUCOSE-CAPILLARY: 414 mg/dL — AB (ref 65–99)
Glucose-Capillary: 425 mg/dL — ABNORMAL HIGH (ref 65–99)

## 2017-11-19 MED ORDER — PREDNISONE 20 MG PO TABS: 40.0000 mg | ORAL_TABLET | Freq: Every day | ORAL | 0 refills | Status: AC

## 2017-11-19 MED ORDER — INSULIN ASPART 100 UNIT/ML ~~LOC~~ SOLN
25.0000 [IU] | Freq: Once | SUBCUTANEOUS | Status: AC
  Administered 2017-11-19: 25 [IU] via SUBCUTANEOUS

## 2017-11-19 NOTE — Discharge Summary (Signed)
Physician Discharge Summary  Andrew [name] SEG:[id number] DOB: March 02, 1941 DOA: 11/17/2017  PCP: [facility]  Admit date: 11/17/2017  Discharge date: 11/19/2017  Admitted From:Home  Disposition:  Home  Recommendations for Outpatient Follow-up:  1. Follow up with PCP in 1-2 weeks  Home Health:N/A  Equipment/Devices:N/A  Discharge Condition:Stable  CODE STATUS: Full  Diet recommendation: Heart Healthy  Brief/Interim Summary:  Andrew [name] a 77 y.o.malewithwith medical history significant forCHF with grade 1 diastolic dysfunction and EF 60%, diabetes, hypertension, atrial fibrillation, and COPD with ongoing tobacco abuse who presented to the emergency department with his daughter who is his primary caregiver on account of worsening shortness of breath and cough over the last 3 weeks.   He is admitted for acute hypoxemic respiratory failure with multifactorial cause of some volume overload and COPD exacerbation.  He diuresed well with IV Lasix and is now euvolemic.  He has also responded well to IV methylprednisolone and will remain on prednisone as prescribed for another week until reevaluated in the outpatient setting.  He did not require 2D echocardiogram in the inpatient setting, but should have one in the outpatient setting to further assess his diastolic CHF to ensure that this is stable.  He was previously noted to have a grade 1 diastolic dysfunction as noted on echo performed on 04/2017.  He is currently stable on room air with no further respiratory distress and his son is at the bedside.   Discharge Diagnoses:  Principal Problem:   Acute on chronic respiratory failure with hypoxia (HCC) Active Problems:   Diabetes mellitus (HCC)   TOBACCO ABUSE   Essential hypertension   ATRIAL FIBRILLATION   COPD exacerbation (HCC)   COPD with exacerbation ([place])  1. Acute hypoxemic respiratory failure likely secondary to COPD exacerbation along with some mild subacute heart  failure.  He has responded well to diuresis as well as pulmonary treatments to include IV steroids.  He will remain on home Lasix as previously prescribed and be discharged on oral prednisone for 1 more week to follow-up with his primary care physician. 2. Chronic CHF with grade 1 diastolic dysfunction. Currently stable, continue home Lasix and obtain 2D echocardiogram in the outpatient setting. 3. Diabetes with labile blood glucose readings. He is mildly hyperglycemic currently and this is likely steroid-induced.  Continue home medications and monitor at home. 4. Atrial fibrillation. Continue home Cardizem. He is currently in sinus rhythm and does not appear to be on any anticoagulation.  5. Hypertension. Continue on Cardizem and monitor closely. 6. Tobacco abuse. Nicotine patch.   Discharge Instructions  Discharge Instructions    Diet - low sodium heart healthy   Complete by:  As directed    Increase activity slowly   Complete by:  As directed      Allergies as of 11/19/2017      Reactions   Procaine Other (See Comments), Shortness Of Breath   Syncope   Procaine Hcl Anaphylaxis, Swelling      Medication List    STOP taking these medications   montelukast 10 MG tablet Commonly known as:  SINGULAIR     TAKE these medications   acetaminophen 650 MG CR tablet Commonly known as:  TYLENOL Take 650 mg by mouth every 8 (eight) hours as needed for pain.   albuterol (2.5 MG/3ML) 0.083% nebulizer solution Commonly known as:  PROVENTIL Take 3 mLs (2.5 mg total) by nebulization every 4 (four) hours as needed for wheezing or shortness of breath. What changed:  Another medication with the same name was removed. Continue taking this medication, and follow the directions you see here.   ALPRAZolam 0.25 MG tablet Commonly known as:  XANAX Take 1 tablet (0.25 mg total) by mouth 3 (three) times daily as needed for anxiety.   diltiazem 300 MG 24 hr capsule Commonly known as:   TIAZAC Take 1 capsule (300 mg total) by mouth daily.   diphenhydrAMINE 25 MG tablet Commonly known as:  BENADRYL Take 1 tablet by mouth at bedtime.   Fluticasone-Salmeterol 500-50 MCG/DOSE Aepb Commonly known as:  ADVAIR DISKUS Inhale 1 puff into the lungs 2 (two) times daily.   furosemide 40 MG tablet Commonly known as:  LASIX Take 1 tablet (40 mg total) by mouth daily.   glipiZIDE 5 MG 24 hr tablet Commonly known as:  GLUCOTROL XL Take 2.5 mg by mouth daily.   ibuprofen 200 MG tablet Commonly known as:  ADVIL,MOTRIN Take 1 tablet (200 mg total) by mouth every 8 (eight) hours as needed (back pain).   insulin aspart 100 UNIT/ML FlexPen Commonly known as:  NOVOLOG Check CBG's there etimes daily before meals. For CBG less than 150: 0 Units For CBG 150-200: 2 Units SQ For CBG 200-250: 4 Units SQ For CBG 250-300: 6 Units SQ For CBG 300-350: 8 Units SQ and call PCP for further instructions   insulin glargine 100 unit/mL Sopn Commonly known as:  LANTUS Inject 0.25 mLs (25 Units total) into the skin daily. What changed:  how much to take   Melatonin 3 MG Tabs Take 1 tablet by mouth at bedtime.   nicotine 21 mg/24hr patch Commonly known as:  NICODERM CQ - dosed in mg/24 hours Place 1 patch (21 mg total) onto the skin daily.   omeprazole 40 MG capsule Commonly known as:  PRILOSEC Take 40 mg by mouth daily.   predniSONE 20 MG tablet Commonly known as:  DELTASONE Take 2 tablets (40 mg total) by mouth daily with breakfast for 7 days.   rosuvastatin 20 MG tablet Commonly known as:  CRESTOR Take 1 tablet (20 mg total) by mouth daily.   tiotropium 18 MCG inhalation capsule Commonly known as:  SPIRIVA HANDIHALER Place 1 capsule (18 mcg total) into inhaler and inhale daily.      Follow-up Information    [facility] Follow up in 1 week(s).   Contact information: [place] [address] [phone]          Allergies   Allergen Reactions  . Procaine Other (See Comments) and Shortness Of Breath    Syncope  . Procaine Hcl Anaphylaxis and Swelling    Consultations:  None   Procedures/Studies: Dg Chest Port 1 View  Result Date: 11/17/2017 CLINICAL DATA:  Shortness of breath. EXAM: PORTABLE CHEST 1 VIEW COMPARISON:  10/08/2017. FINDINGS: Cardiomegaly with mild pulmonary venous congestion. Improved aeration both lung bases compared to prior chest x-ray of 10/08/2017. Persistent mild bibasilar atelectasis. No pleural effusion or pneumothorax. Stable scratched it no acute bony abnormality. IMPRESSION: 1. Improved aeration both lung bases compared to prior chest x-ray of 10/08/2017. Persistent mild bibasilar atelectasis. 2.  Stable cardiomegaly with mild pulmonary venous congestion. Electronically Signed   By: Marcello Moores  Register   On: 11/17/2017 12:46     Discharge Exam: Vitals:   11/19/17 1100 11/19/17 1108  BP:    Pulse:    Resp:    Temp:    SpO2: 96% 93%   Vitals:   11/19/17 0544 11/19/17  4315 11/19/17 1100 11/19/17 1108  BP:      Pulse:      Resp:      Temp:      TempSrc:      SpO2: 95% 96% 96% 93%  Weight:      Height:        General: Pt is alert, awake, not in acute distress Cardiovascular: RRR, S1/S2 +, no rubs, no gallops Respiratory: CTA bilaterally, no wheezing, no rhonchi Abdominal: Soft, NT, ND, bowel sounds + Extremities: no edema, no cyanosis    The results of significant diagnostics from this hospitalization (including imaging, microbiology, ancillary and laboratory) are listed below for reference.     Microbiology: Recent Results (from the past 240 hour(s))  Blood culture (routine x 2)     Status: None (Preliminary result)   Collection Time: 11/17/17  1:01 PM  Result Value Ref Range Status   Specimen Description BLOOD BLOOD LEFT HAND  Final   Special Requests   Final    BOTTLES DRAWN AEROBIC AND ANAEROBIC Blood Culture adequate volume   Culture   Final    NO GROWTH  2 DAYS Performed at [facility], [address] [place], [place] [address]    Report Status PENDING  Incomplete  Blood culture (routine x 2)     Status: None (Preliminary result)   Collection Time: 11/17/17  1:02 PM  Result Value Ref Range Status   Specimen Description BLOOD BLOOD LEFT HAND  Final   Special Requests   Final    BOTTLES DRAWN AEROBIC AND ANAEROBIC Blood Culture adequate volume   Culture   Final    NO GROWTH 2 DAYS Performed at [facility], [address], [place], [place] [address]    Report Status PENDING  Incomplete     Labs: BNP (last 3 results) Recent Labs    06/22/17 2011 06/28/17 0909 11/17/17 1231  BNP 187.0* 88.0 09.3   Basic Metabolic Panel: Recent Labs  Lab 11/17/17 1231 11/18/17 0747 11/19/17 0602  NA 143 138 140  K 3.5 3.8 3.8  CL 95* 92* 93*  CO2 37* 34* 34*  GLUCOSE 54* 287* 256*  BUN 10 15 25*  CREATININE 0.74 0.73 0.85  CALCIUM 9.0 8.9 9.1   Liver Function Tests: Recent Labs  Lab 11/17/17 1231  AST 14*  ALT 11*  ALKPHOS 75  BILITOT 0.5  PROT 6.6  ALBUMIN 3.1*   No results for input(s): LIPASE, AMYLASE in the last 168 hours. No results for input(s): AMMONIA in the last 168 hours. CBC: Recent Labs  Lab 11/17/17 1216 11/18/17 0747 11/19/17 0602  WBC 9.3 11.6* 17.7*  NEUTROABS 6.9  --   --   HGB 13.0 12.3* 13.1  HCT 45.5 41.8 44.1  MCV 87.0 85.1 85.5  PLT 261 262 288   Cardiac Enzymes: Recent Labs  Lab 11/17/17 1231  TROPONINI <0.03   BNP: Invalid input(s): POCBNP CBG: Recent Labs  Lab 11/18/17 0814 11/18/17 1209 11/18/17 1711 11/18/17 2037 11/19/17 0752  GLUCAP 256* 222* 277* 325* 349*   D-Dimer No results for input(s): DDIMER in the last 72 hours. Hgb A1c No results for input(s): HGBA1C in the last 72 hours. Lipid Profile No results for input(s): CHOL, HDL, LDLCALC, TRIG, CHOLHDL, LDLDIRECT in the last 72 hours. Thyroid function studies No results for input(s): TSH, T4TOTAL, T3FREE, THYROIDAB in  the last 72 hours.  Invalid input(s): FREET3 Anemia work up No results for input(s): VITAMINB12, FOLATE, FERRITIN, TIBC, IRON, RETICCTPCT in  the last 72 hours. Urinalysis    Component Value Date/Time   COLORURINE AMBER (A) 05/05/2013 1621   APPEARANCEUR CLEAR 05/05/2013 1621   LABSPEC 1.025 05/05/2013 1621   PHURINE 5.5 05/05/2013 1621   GLUCOSEU NEGATIVE 05/05/2013 1621   HGBUR NEGATIVE 05/05/2013 1621   HGBUR negative 03/31/2009 1514   BILIRUBINUR SMALL (A) 05/05/2013 1621   KETONESUR NEGATIVE 05/05/2013 1621   PROTEINUR NEGATIVE 05/05/2013 1621   UROBILINOGEN 1.0 05/05/2013 1621   NITRITE NEGATIVE 05/05/2013 1621   LEUKOCYTESUR NEGATIVE 05/05/2013 1621   Sepsis Labs Invalid input(s): PROCALCITONIN,  WBC,  LACTICIDVEN Microbiology Recent Results (from the past 240 hour(s))  Blood culture (routine x 2)     Status: None (Preliminary result)   Collection Time: 11/17/17  1:01 PM  Result Value Ref Range Status   Specimen Description BLOOD BLOOD LEFT HAND  Final   Special Requests   Final    BOTTLES DRAWN AEROBIC AND ANAEROBIC Blood Culture adequate volume   Culture   Final    NO GROWTH 2 DAYS Performed at [facility], [address], [place], [place] [address]    Report Status PENDING  Incomplete  Blood culture (routine x 2)     Status: None (Preliminary result)   Collection Time: 11/17/17  1:02 PM  Result Value Ref Range Status   Specimen Description BLOOD BLOOD LEFT HAND  Final   Special Requests   Final    BOTTLES DRAWN AEROBIC AND ANAEROBIC Blood Culture adequate volume   Culture   Final    NO GROWTH 2 DAYS Performed at [facility], [address], [place], [place] [address]    Report Status PENDING  Incomplete     Time coordinating discharge: Over 30 minutes  SIGNED:   Rodena Goldmann, DO [facility] 11/19/2017, 11:56 AM Pager [phone]  If 7PM-7AM, please contact night-coverage [email or web address] Password [email or web address]

## 2017-11-19 NOTE — Progress Notes (Signed)
Patient states understanding of discharge instructions.  

## 2017-11-19 NOTE — ED Provider Notes (Signed)
[facility] EMERGENCY DEPARTMENT Provider Note   CSN: [id number] Arrival date & time: 11/19/17  2321  Time seen 23:45 PM   History   Chief Complaint Chief Complaint  Patient presents with  . Choking    HPI Andrew [name] is a 77 y.o. male.  HPI patient states he was just discharged from the hospital earlier today, he was admitted on March 15 for COPD exacerbation.  He states his daughter fixed him some grits with red eye gravy just prior to arrival and he states that his food started to "pack up" in his esophagus.  He tried to drink water but states he started vomiting.  EMS was called who report patient was obstructed and they did the Heimlich maneuver and he felt better.  They also report he vomited after that.  He states he was able to drink water after that and everything is going down now.  He states he has had trouble swallowing in the past and the last time he had his esophagus stretched was in 1993.  He denies feeling like he has trouble with food passing in his esophagus however the way he talks I suspect it has been going on.  Patient denies being short of breath now, he states he has COPD and will be "short of breath until the day I die".  He is on home oxygen at 3 L/min mainly at night.  Patient continues to smoke.  EMS also report his pulse ox was in the 80s on their arrival.  Patient states he is breathing okay right now.  He denies having a gastroenterologist either at the [place] or locally.  PCP [facility] in [place]  Past Medical History:  Diagnosis Date  . A-fib ([place])   . Abdominal pain   . Adenomatous colon polyp   . Arthritis   . Asthma   . CAD (coronary artery disease)   . Cancer ([place])    skin, nose  . Cataract    bilateral  . CHF (congestive heart failure) ([place])    patient reports  . Chronic LBP   . COPD (chronic obstructive pulmonary disease) ([place])   . Decreased libido   . Diverticulosis of colon   . Esophageal stricture   . Fatigue   . GERD (gastroesophageal  reflux disease)   . History of small bowel obstruction   . Hyperlipidemia   . Hypertension   . Hypertrophy of prostate with urinary obstruction and other lower urinary tract symptoms (LUTS)   . Palpitations   . Peri-rectal abscess   . Personal history of renal calculi   . Prostatitis, acute   . Stroke [facility])    pt reports  . Tobacco abuse     Patient Active Problem List   Diagnosis Date Noted  . Acute on chronic respiratory failure with hypoxia ([place]) 11/17/2017  . COPD with exacerbation ([place]) 11/17/2017  . Hospital acquired PNA 05/14/2017  . COPD exacerbation ([place])   . HCAP (healthcare-associated pneumonia)   . Acute respiratory failure with hypoxia and hypercapnia ([place]) 04/12/2017  . Pneumonia 10/24/2016  . AAA (abdominal aortic aneurysm) ([place]) 01/04/2011  . Dizziness, nonspecific 12/26/2010  . UNSPECIFIED PERIPHERAL VASCULAR DISEASE 09/23/2010  . LEG CRAMPS 09/13/2010  . Diabetes mellitus ([place]) 02/11/2010  . CHRONIC OBSTRUCTIVE PULMONARY DISEASE, ACUTE EXACERBATION 02/11/2010  . OLECRANON BURSITIS, RIGHT 01/26/2010  . PENILE PAIN 12/29/2009  . ABNORMAL EJACULATION 05/05/2009  . LARYNGITIS, ACUTE 04/21/2009  . ATRIAL FIBRILLATION 04/08/2009  . PALPITATIONS 04/08/2009  . FATIGUE 03/31/2009  .  LIBIDO, DECREASED 03/31/2009  . HYPERTROPHY PROSTATE W/UR OBST & OTH LUTS 03/25/2009  . ACUTE PROSTATITIS 03/25/2009  . SMALL BOWEL OBSTRUCTION, HX OF 11/28/2007  . Hyperlipidemia 11/13/2007  . COPD 10/02/2007  . LOW BACK PAIN, CHRONIC 10/02/2007  . ADENOMATOUS COLONIC POLYP 05/22/2007  . ESOPHAGEAL STRICTURE 05/22/2007  . DIVERTICULOSIS, COLON 05/22/2007  . TOBACCO ABUSE 05/08/2007  . Essential hypertension 05/08/2007  . CORONARY ARTERY DISEASE 05/08/2007  . ASTHMA 05/08/2007  . GERD 05/08/2007  . RENAL CALCULUS, HX OF 05/08/2007  . ABSCESS, PERIRECTAL, HX OF 05/08/2007  . ARTHRITIS, HX OF 05/08/2007  . PENILE PROSTHESIS 05/08/2007  . LAMINECTOMY, LUMBAR, HX OF 05/08/2007      Past Surgical History:  Procedure Laterality Date  . CATARACT EXTRACTION  04/2010   bilateral  . HERNIA REPAIR    . LUMBAR LAMINECTOMY     with Harrington rods  . PENILE PROSTHESIS PLACEMENT    . TONSILLECTOMY         Home Medications    Prior to Admission medications   Medication Sig Start Date End Date Taking? Authorizing Provider  acetaminophen (TYLENOL) 650 MG CR tablet Take 650 mg by mouth every 8 (eight) hours as needed for pain.   Yes [provider]  albuterol (PROVENTIL) (2.5 MG/3ML) 0.083% nebulizer solution Take 3 mLs (2.5 mg total) by nebulization every 4 (four) hours as needed for wheezing or shortness of breath. 07/03/17  Yes Arrien, Jimmy Picket, MD  ALPRAZolam Duanne Moron) 0.25 MG tablet Take 1 tablet (0.25 mg total) by mouth 3 (three) times daily as needed for anxiety. 07/03/17  Yes Arrien, Jimmy Picket, MD  diphenhydrAMINE (BENADRYL) 25 MG tablet Take 1 tablet by mouth at bedtime.   Yes [provider]  furosemide (LASIX) 40 MG tablet Take 1 tablet (40 mg total) by mouth daily. 07/03/17  Yes Arrien, Jimmy Picket, MD  ibuprofen (ADVIL,MOTRIN) 200 MG tablet Take 1 tablet (200 mg total) by mouth every 8 (eight) hours as needed (back pain). 07/03/17  Yes Arrien, Jimmy Picket, MD  insulin aspart (NOVOLOG) 100 UNIT/ML FlexPen Check CBG's there etimes daily before meals. For CBG less than 150: 0 Units For CBG 150-200: 2 Units SQ For CBG 200-250: 4 Units SQ For CBG 250-300: 6 Units SQ For CBG 300-350: 8 Units SQ and call PCP for further instructions 07/03/17  Yes Arrien, Jimmy Picket, MD  Melatonin 3 MG TABS Take 1 tablet by mouth at bedtime.   Yes [provider]  omeprazole (PRILOSEC) 40 MG capsule Take 40 mg by mouth daily.   Yes [provider]  predniSONE (DELTASONE) 20 MG tablet Take 2 tablets (40 mg total) by mouth daily with breakfast for 7 days. 11/19/17 11/26/17 Yes Shah, Pratik D, DO  rosuvastatin (CRESTOR) 20 MG  tablet Take 1 tablet (20 mg total) by mouth daily. 07/03/17  Yes Arrien, Jimmy Picket, MD  tiotropium (SPIRIVA HANDIHALER) 18 MCG inhalation capsule Place 1 capsule (18 mcg total) into inhaler and inhale daily. 07/03/17 07/03/18 Yes Arrien, Jimmy Picket, MD  diltiazem [facility]) 300 MG 24 hr capsule Take 1 capsule (300 mg total) by mouth daily. 07/03/17 11/17/17  Arrien, Jimmy Picket, MD  Fluticasone-Salmeterol (ADVAIR DISKUS) 500-50 MCG/DOSE AEPB Inhale 1 puff into the lungs 2 (two) times daily. 07/03/17 11/17/17  Arrien, Jimmy Picket, MD  glipiZIDE (GLUCOTROL XL) 5 MG 24 hr tablet Take 2.5 mg by mouth daily.  10/03/16   [provider]  insulin glargine (LANTUS) 100 unit/mL SOPN Inject 0.25 mLs (25  Units total) into the skin daily. Patient taking differently: Inject 10 Units into the skin daily.  07/03/17 11/17/17  Arrien, Jimmy Picket, MD  nicotine (NICODERM CQ - DOSED IN MG/24 HOURS) 21 mg/24hr patch Place 1 patch (21 mg total) onto the skin daily. Patient not taking: Reported on 11/17/2017 07/03/17   Arrien, Jimmy Picket, MD    Family History Family History  Problem Relation Age of Onset  . Heart disease Brother        CAD  . Diabetes Brother   . COPD Brother   . Emphysema Brother     Social History Social History   Tobacco Use  . Smoking status: Current Every Day Smoker    Packs/day: 1.00    Types: Cigarettes  . Smokeless tobacco: Never Used  Substance Use Topics  . Alcohol use: No  . Drug use: No  lives at home Lives with daughter On oxygen 3 lpm [place] States he smokes 5 cigs a day (has a pack in his shirt pocket)  Allergies   Procaine and Procaine hcl   Review of Systems Review of Systems  All other systems reviewed and are negative.    Physical Exam Updated Vital Signs BP (!) 135/93   Temp 98.9 F (37.2 C) (Oral)   Resp (!) 22   Ht 5\' 5"  (1.651 m)   Wt 74.4 kg (164 lb)   SpO2 97%   BMI 27.29 kg/m   Vital signs normal    Physical  Exam  Constitutional: He appears well-developed and well-nourished. No distress.  HENT:  Head: Normocephalic and atraumatic.  Right Ear: External ear normal.  Left Ear: External ear normal.  Nose: Nose normal.  Mouth/Throat: Oropharynx is clear and moist.  Eyes: Conjunctivae and EOM are normal. Pupils are equal, round, and reactive to light.  Neck: Normal range of motion. Neck supple.  Cardiovascular: Normal rate, regular rhythm and normal heart sounds.  Pulmonary/Chest: Accessory muscle usage present. Tachypnea noted. He has wheezes.  Patient appears to be short of breath however he denies feeling like his breathing is worse.  He states this is his baseline.  He has some scattered wheezing in the bases of his lungs however he refuses a nebulizer, he states he does not need one now.  Nursing note and vitals reviewed.    ED Treatments / Results  Labs (all labs ordered are listed, but only abnormal results are displayed) Labs Reviewed - No data to display  EKG  EKG Interpretation None       Radiology Dg Chest 2 View  Result Date: 11/20/2017 CLINICAL DATA:  Choking episode EXAM: CHEST - 2 VIEW COMPARISON:  11/17/2017 FINDINGS: Stable cardiomegaly with tortuous thoracic aorta. There is minimal atelectasis at the lung bases emphysematous hyperinflation of both lungs otherwise. No overt pulmonary edema or pneumonic consolidation. No acute osseous abnormality. IMPRESSION: Mild emphysematous hyperinflation of the lungs with bibasilar atelectasis. No pulmonary consolidation. Stable cardiomegaly. Electronically Signed   By: Ashley Royalty M.D.   On: 11/20/2017 00:52    Procedures Procedures (including critical care time)  Medications Ordered in ED Medications - No data to display   Initial Impression / Assessment and Plan / ED Course  I have reviewed the triage vital signs and the nursing notes.  Pertinent labs & imaging results that were available during my care of the patient were  reviewed by me and considered in my medical decision making (see chart for details).    Chest x-ray was done to make  sure patient did not aspirate.  For some reason the patient denies choking.  However EMS described choking to the triage nurse.   1 AM after reviewing patient's chest x-ray went into see patient again.  He is resting comfortably although he does have some increased respiratory effort.  Patient was easily awakened.  He was given a small amount of water to drink which he drank without difficulty.  He states it felt like it went down.  He states his breathing is at his baseline.  We discussed seeing a gastroenterologist because he might need to have his esophagus stretched however he states he cannot be put to sleep because of his underlying lung disease.  He is going to call his primary care doctor in the morning at the [facility] and let them know about this episode and also his recent admission from the hospital.  We discussed he needs to be careful with what he eats so that he does not have another episode. PT states all his doctors are at the [facility].   Final Clinical Impressions(s) / ED Diagnoses   Final diagnoses:  Food impaction of esophagus, initial encounter    ED Discharge Orders    None      Plan discharge  Rolland Porter, MD, Barbette Or, MD 11/20/17 [phone]

## 2017-11-19 NOTE — Progress Notes (Signed)
Patients hear rate on monitor does not match pulse . HR 71 ekg -- Sat monitor 45 pulse

## 2017-11-19 NOTE — ED Triage Notes (Signed)
Pt arrived by EMS from home. States pt had obstructed air upon arrival. Was cleared pt vomited before & after clearing. Pt in no respiratory distress now. EMS gave 5 mg albuterol.

## 2017-11-19 NOTE — Progress Notes (Signed)
Checked on patient he is asleep

## 2017-11-20 ENCOUNTER — Emergency Department (HOSPITAL_COMMUNITY): Payer: Medicare PPO

## 2017-11-20 NOTE — Discharge Instructions (Signed)
Look at the dysphagia diet for suggestions to help avoid foods that are more likely to get stuck in your esophagus when you eat. Let your doctor know about your ED visit tonight and your hospitalization yesterday. Return to the ED if you feel like you food is getting stuck again.

## 2017-11-22 LAB — CULTURE, BLOOD (ROUTINE X 2)
Culture: NO GROWTH
Culture: NO GROWTH
Special Requests: ADEQUATE
Special Requests: ADEQUATE

## 2017-12-26 ENCOUNTER — Encounter (HOSPITAL_COMMUNITY): Payer: Self-pay | Admitting: Emergency Medicine

## 2017-12-26 ENCOUNTER — Other Ambulatory Visit: Payer: Self-pay

## 2017-12-26 ENCOUNTER — Inpatient Hospital Stay (HOSPITAL_COMMUNITY)
Admission: EM | Admit: 2017-12-26 | Discharge: 2017-12-30 | DRG: 177 | Disposition: A | Discharge status: Home / Self Care | Payer: Medicare PPO | Attending: Family Medicine | Admitting: Family Medicine

## 2017-12-26 ENCOUNTER — Emergency Department (HOSPITAL_COMMUNITY): Payer: Medicare PPO

## 2017-12-26 DIAGNOSIS — Z8249 Family history of ischemic heart disease and other diseases of the circulatory system: Secondary | ICD-10-CM | POA: Diagnosis not present

## 2017-12-26 DIAGNOSIS — Z833 Family history of diabetes mellitus: Secondary | ICD-10-CM | POA: Diagnosis not present

## 2017-12-26 DIAGNOSIS — Z825 Family history of asthma and other chronic lower respiratory diseases: Secondary | ICD-10-CM

## 2017-12-26 DIAGNOSIS — Z794 Long term (current) use of insulin: Secondary | ICD-10-CM

## 2017-12-26 DIAGNOSIS — E785 Hyperlipidemia, unspecified: Secondary | ICD-10-CM | POA: Diagnosis present

## 2017-12-26 DIAGNOSIS — I480 Paroxysmal atrial fibrillation: Secondary | ICD-10-CM | POA: Diagnosis not present

## 2017-12-26 DIAGNOSIS — F17200 Nicotine dependence, unspecified, uncomplicated: Secondary | ICD-10-CM | POA: Diagnosis not present

## 2017-12-26 DIAGNOSIS — J9600 Acute respiratory failure, unspecified whether with hypoxia or hypercapnia: Secondary | ICD-10-CM | POA: Diagnosis present

## 2017-12-26 DIAGNOSIS — F17210 Nicotine dependence, cigarettes, uncomplicated: Secondary | ICD-10-CM | POA: Diagnosis present

## 2017-12-26 DIAGNOSIS — R0602 Shortness of breath: Secondary | ICD-10-CM | POA: Diagnosis present

## 2017-12-26 DIAGNOSIS — I110 Hypertensive heart disease with heart failure: Secondary | ICD-10-CM | POA: Diagnosis present

## 2017-12-26 DIAGNOSIS — K219 Gastro-esophageal reflux disease without esophagitis: Secondary | ICD-10-CM | POA: Diagnosis present

## 2017-12-26 DIAGNOSIS — T380X5A Adverse effect of glucocorticoids and synthetic analogues, initial encounter: Secondary | ICD-10-CM | POA: Diagnosis present

## 2017-12-26 DIAGNOSIS — Z9111 Patient's noncompliance with dietary regimen: Secondary | ICD-10-CM

## 2017-12-26 DIAGNOSIS — E119 Type 2 diabetes mellitus without complications: Secondary | ICD-10-CM

## 2017-12-26 DIAGNOSIS — I5033 Acute on chronic diastolic (congestive) heart failure: Secondary | ICD-10-CM | POA: Diagnosis not present

## 2017-12-26 DIAGNOSIS — J441 Chronic obstructive pulmonary disease with (acute) exacerbation: Secondary | ICD-10-CM | POA: Diagnosis not present

## 2017-12-26 DIAGNOSIS — J690 Pneumonitis due to inhalation of food and vomit: Principal | ICD-10-CM | POA: Diagnosis present

## 2017-12-26 DIAGNOSIS — R1310 Dysphagia, unspecified: Secondary | ICD-10-CM | POA: Diagnosis not present

## 2017-12-26 DIAGNOSIS — J9620 Acute and chronic respiratory failure, unspecified whether with hypoxia or hypercapnia: Secondary | ICD-10-CM

## 2017-12-26 DIAGNOSIS — J189 Pneumonia, unspecified organism: Secondary | ICD-10-CM

## 2017-12-26 DIAGNOSIS — E1165 Type 2 diabetes mellitus with hyperglycemia: Secondary | ICD-10-CM | POA: Diagnosis present

## 2017-12-26 DIAGNOSIS — I2510 Atherosclerotic heart disease of native coronary artery without angina pectoris: Secondary | ICD-10-CM | POA: Diagnosis present

## 2017-12-26 DIAGNOSIS — Z8673 Personal history of transient ischemic attack (TIA), and cerebral infarction without residual deficits: Secondary | ICD-10-CM

## 2017-12-26 DIAGNOSIS — J9621 Acute and chronic respiratory failure with hypoxia: Secondary | ICD-10-CM | POA: Diagnosis present

## 2017-12-26 DIAGNOSIS — I4891 Unspecified atrial fibrillation: Secondary | ICD-10-CM | POA: Diagnosis present

## 2017-12-26 LAB — CBC WITH DIFFERENTIAL/PLATELET
BASOS PCT: 0 %
Basophils Absolute: 0 10*3/uL (ref 0.0–0.1)
Eosinophils Absolute: 0 10*3/uL (ref 0.0–0.7)
Eosinophils Relative: 0 %
HEMATOCRIT: 47 % (ref 39.0–52.0)
HEMOGLOBIN: 13.8 g/dL (ref 13.0–17.0)
Lymphocytes Relative: 6 %
Lymphs Abs: 0.7 10*3/uL (ref 0.7–4.0)
MCH: 25.6 pg — ABNORMAL LOW (ref 26.0–34.0)
MCHC: 29.4 g/dL — AB (ref 30.0–36.0)
MCV: 87.2 fL (ref 78.0–100.0)
MONOS PCT: 4 %
Monocytes Absolute: 0.4 10*3/uL (ref 0.1–1.0)
NEUTROS ABS: 9.8 10*3/uL — AB (ref 1.7–7.7)
NEUTROS PCT: 90 %
Platelets: 232 10*3/uL (ref 150–400)
RBC: 5.39 MIL/uL (ref 4.22–5.81)
RDW: 17.5 % — ABNORMAL HIGH (ref 11.5–15.5)
WBC: 11 10*3/uL — ABNORMAL HIGH (ref 4.0–10.5)

## 2017-12-26 LAB — GLUCOSE, CAPILLARY: Glucose-Capillary: 243 mg/dL — ABNORMAL HIGH (ref 65–99)

## 2017-12-26 LAB — BRAIN NATRIURETIC PEPTIDE: B Natriuretic Peptide: 119 pg/mL — ABNORMAL HIGH (ref 0.0–100.0)

## 2017-12-26 LAB — COMPREHENSIVE METABOLIC PANEL
ALK PHOS: 85 U/L (ref 38–126)
ALT: 16 U/L — ABNORMAL LOW (ref 17–63)
AST: 12 U/L — AB (ref 15–41)
Albumin: 3.6 g/dL (ref 3.5–5.0)
Anion gap: 13 (ref 5–15)
BUN: 13 mg/dL (ref 6–20)
CALCIUM: 9.2 mg/dL (ref 8.9–10.3)
CO2: 35 mmol/L — AB (ref 22–32)
Chloride: 96 mmol/L — ABNORMAL LOW (ref 101–111)
Creatinine, Ser: 0.89 mg/dL (ref 0.61–1.24)
GFR calc Af Amer: >60 mL/min (ref >60)
GFR calc non Af Amer: >60 mL/min (ref >60)
GLUCOSE: 165 mg/dL — AB (ref 65–99)
Potassium: 4.1 mmol/L (ref 3.5–5.1)
SODIUM: 144 mmol/L (ref 135–145)
Total Bilirubin: 0.8 mg/dL (ref 0.3–1.2)
Total Protein: 7.1 g/dL (ref 6.5–8.1)

## 2017-12-26 LAB — TROPONIN I: Troponin I: <0.03 ng/mL (ref <0.03)

## 2017-12-26 MED ORDER — INSULIN GLARGINE 100 UNIT/ML ~~LOC~~ SOLN
25.0000 [IU] | Freq: Every day | SUBCUTANEOUS | Status: DC
  Filled 2017-12-26: qty 0.25

## 2017-12-26 MED ORDER — DILTIAZEM HCL ER BEADS 300 MG PO CP24
300.0000 mg | ORAL_CAPSULE | Freq: Every day | ORAL | Status: DC
  Administered 2017-12-27 – 2017-12-30 (×4): 300 mg via ORAL
  Filled 2017-12-26 (×4): qty 1

## 2017-12-26 MED ORDER — PANTOPRAZOLE SODIUM 40 MG PO TBEC
40.0000 mg | DELAYED_RELEASE_TABLET | Freq: Every day | ORAL | Status: DC
  Administered 2017-12-27 – 2017-12-30 (×4): 40 mg via ORAL
  Filled 2017-12-26 (×4): qty 1

## 2017-12-26 MED ORDER — GUAIFENESIN ER 600 MG PO TB12
1200.0000 mg | ORAL_TABLET | Freq: Two times a day (BID) | ORAL | Status: DC
  Administered 2017-12-26 – 2017-12-30 (×8): 1200 mg via ORAL
  Filled 2017-12-26 (×8): qty 2

## 2017-12-26 MED ORDER — ONDANSETRON HCL 4 MG/2ML IJ SOLN: 4.0000 mg | Freq: Four times a day (QID) | INTRAMUSCULAR | Status: DC | PRN

## 2017-12-26 MED ORDER — SODIUM CHLORIDE 0.9% FLUSH
3.0000 mL | Freq: Two times a day (BID) | INTRAVENOUS | Status: DC
  Administered 2017-12-26 – 2017-12-30 (×8): 3 mL via INTRAVENOUS

## 2017-12-26 MED ORDER — IBUPROFEN 400 MG PO TABS: 200.0000 mg | ORAL_TABLET | Freq: Three times a day (TID) | ORAL | Status: DC | PRN

## 2017-12-26 MED ORDER — LOSARTAN POTASSIUM 50 MG PO TABS
25.0000 mg | ORAL_TABLET | Freq: Every day | ORAL | Status: DC
  Administered 2017-12-27 – 2017-12-30 (×4): 25 mg via ORAL
  Filled 2017-12-26 (×4): qty 1

## 2017-12-26 MED ORDER — ACETAMINOPHEN 325 MG PO TABS: 650.0000 mg | ORAL_TABLET | ORAL | Status: DC | PRN

## 2017-12-26 MED ORDER — VANCOMYCIN HCL IN DEXTROSE 750-5 MG/150ML-% IV SOLN
750.0000 mg | Freq: Two times a day (BID) | INTRAVENOUS | Status: DC
  Filled 2017-12-26: qty 150

## 2017-12-26 MED ORDER — SODIUM CHLORIDE 0.9% FLUSH: 3.0000 mL | INTRAVENOUS | Status: DC | PRN

## 2017-12-26 MED ORDER — ALPRAZOLAM 0.25 MG PO TABS
0.2500 mg | ORAL_TABLET | Freq: Three times a day (TID) | ORAL | Status: DC | PRN
  Administered 2017-12-26 – 2017-12-29 (×5): 0.25 mg via ORAL
  Filled 2017-12-26 (×5): qty 1

## 2017-12-26 MED ORDER — INSULIN GLARGINE 100 UNIT/ML ~~LOC~~ SOLN
25.0000 [IU] | Freq: Every day | SUBCUTANEOUS | Status: DC
  Administered 2017-12-26: 25 [IU] via SUBCUTANEOUS
  Filled 2017-12-26 (×2): qty 0.25

## 2017-12-26 MED ORDER — VANCOMYCIN HCL 10 G IV SOLR
1750.0000 mg | Freq: Once | INTRAVENOUS | Status: AC
  Administered 2017-12-26: 1750 mg via INTRAVENOUS
  Filled 2017-12-26: qty 1750

## 2017-12-26 MED ORDER — IPRATROPIUM-ALBUTEROL 0.5-2.5 (3) MG/3ML IN SOLN
3.0000 mL | Freq: Once | RESPIRATORY_TRACT | Status: AC
  Administered 2017-12-26: 3 mL via RESPIRATORY_TRACT
  Filled 2017-12-26: qty 3

## 2017-12-26 MED ORDER — SODIUM CHLORIDE 0.9 % IV SOLN
1.0000 g | Freq: Three times a day (TID) | INTRAVENOUS | Status: DC
  Administered 2017-12-26: 1 g via INTRAVENOUS
  Filled 2017-12-26 (×3): qty 1

## 2017-12-26 MED ORDER — INSULIN ASPART 100 UNIT/ML ~~LOC~~ SOLN
0.0000 [IU] | Freq: Three times a day (TID) | SUBCUTANEOUS | Status: DC
  Administered 2017-12-27: 5 [IU] via SUBCUTANEOUS

## 2017-12-26 MED ORDER — SODIUM CHLORIDE 0.9 % IV SOLN: 250.0000 mL | INTRAVENOUS | Status: DC | PRN

## 2017-12-26 MED ORDER — MOMETASONE FURO-FORMOTEROL FUM 200-5 MCG/ACT IN AERO
2.0000 | INHALATION_SPRAY | Freq: Two times a day (BID) | RESPIRATORY_TRACT | Status: DC
  Administered 2017-12-26 – 2017-12-30 (×8): 2 via RESPIRATORY_TRACT
  Filled 2017-12-26 (×2): qty 8.8

## 2017-12-26 MED ORDER — FUROSEMIDE 10 MG/ML IJ SOLN
40.0000 mg | Freq: Once | INTRAMUSCULAR | Status: AC
  Administered 2017-12-26: 40 mg via INTRAVENOUS
  Filled 2017-12-26: qty 4

## 2017-12-26 MED ORDER — METHYLPREDNISOLONE SODIUM SUCC 40 MG IJ SOLR
40.0000 mg | Freq: Two times a day (BID) | INTRAMUSCULAR | Status: DC
  Administered 2017-12-27: 40 mg via INTRAVENOUS
  Filled 2017-12-26: qty 1

## 2017-12-26 MED ORDER — SODIUM CHLORIDE 0.9 % IV SOLN
3.0000 g | Freq: Four times a day (QID) | INTRAVENOUS | Status: DC
  Administered 2017-12-26 – 2017-12-30 (×15): 3 g via INTRAVENOUS
  Filled 2017-12-26 (×26): qty 3

## 2017-12-26 MED ORDER — METHYLPREDNISOLONE SODIUM SUCC 125 MG IJ SOLR
60.0000 mg | Freq: Once | INTRAMUSCULAR | Status: AC
Start: 2017-12-26 — End: 2017-12-26
  Administered 2017-12-26: 60 mg via INTRAVENOUS
  Filled 2017-12-26: qty 2

## 2017-12-26 MED ORDER — ALBUTEROL SULFATE (2.5 MG/3ML) 0.083% IN NEBU
2.5000 mg | INHALATION_SOLUTION | RESPIRATORY_TRACT | Status: DC | PRN
  Administered 2017-12-27 – 2017-12-30 (×3): 2.5 mg via RESPIRATORY_TRACT
  Filled 2017-12-26 (×3): qty 3

## 2017-12-26 MED ORDER — INSULIN ASPART 100 UNIT/ML ~~LOC~~ SOLN
0.0000 [IU] | Freq: Every day | SUBCUTANEOUS | Status: DC
  Administered 2017-12-26: 2 [IU] via SUBCUTANEOUS

## 2017-12-26 MED ORDER — FUROSEMIDE 10 MG/ML IJ SOLN
40.0000 mg | Freq: Two times a day (BID) | INTRAMUSCULAR | Status: DC
  Administered 2017-12-27: 40 mg via INTRAVENOUS
  Filled 2017-12-26: qty 4

## 2017-12-26 MED ORDER — NICOTINE 21 MG/24HR TD PT24
21.0000 mg | MEDICATED_PATCH | Freq: Every day | TRANSDERMAL | Status: DC
  Administered 2017-12-27 – 2017-12-30 (×4): 21 mg via TRANSDERMAL
  Filled 2017-12-26 (×4): qty 1

## 2017-12-26 MED ORDER — ORAL CARE MOUTH RINSE
15.0000 mL | Freq: Two times a day (BID) | OROMUCOSAL | Status: DC
  Administered 2017-12-26 – 2017-12-30 (×5): 15 mL via OROMUCOSAL

## 2017-12-26 MED ORDER — APIXABAN 5 MG PO TABS
5.0000 mg | ORAL_TABLET | Freq: Two times a day (BID) | ORAL | Status: DC
  Administered 2017-12-26 – 2017-12-30 (×8): 5 mg via ORAL
  Filled 2017-12-26 (×8): qty 1

## 2017-12-26 MED ORDER — IPRATROPIUM-ALBUTEROL 0.5-2.5 (3) MG/3ML IN SOLN
3.0000 mL | Freq: Four times a day (QID) | RESPIRATORY_TRACT | Status: DC
  Administered 2017-12-26 – 2017-12-27 (×4): 3 mL via RESPIRATORY_TRACT
  Filled 2017-12-26 (×4): qty 3

## 2017-12-26 MED ORDER — ROSUVASTATIN CALCIUM 20 MG PO TABS
20.0000 mg | ORAL_TABLET | Freq: Every day | ORAL | Status: DC
Start: 2017-12-26 — End: 2017-12-30
  Administered 2017-12-26 – 2017-12-29 (×4): 20 mg via ORAL
  Filled 2017-12-26 (×4): qty 1

## 2017-12-26 NOTE — ED Notes (Signed)
Patient transported to X-ray 

## 2017-12-26 NOTE — ED Triage Notes (Addendum)
Pt c/o increased SOB that began yesterday. Pt with edema to lower extremities. Hx of COPD/CHF, wears oxygen at night. Pt also reports difficulty with ambulation due to weakness since he was hospitalized last year.

## 2017-12-26 NOTE — H&P (Signed)
History and Physical    Andrew [name]:[id number] DOB: 1941/06/04 DOA: 12/26/2017  PCP: [facility]  Patient coming from: Home  I have personally briefly reviewed patient's old medical records in [place]  Chief Complaint: Shortness of breath  HPI: Andrew [name] is a 77 y.o. male with medical history significant of COPD, chronic respiratory failure, chronic diastolic congestive heart failure, dysphagia with recurrent aspiration, presents to the hospital with complaints of shortness of breath.  Patient reports over the past week he has been having progressive shortness of breath.  Daughter reports that he has been having increasing cough, wheezing shortness of breath.  She is also noticed worsening lower extremity edema.  The patient continues to smoke cigarettes.  He is been using his nebulizer at home without significant benefit.  He has not had any chest pain.  He is not had any fevers.  Daughter reports that he has had swallow evaluations in the past and has been told that he has dysphagia.  He was unable to tolerate thickened foods she notes that he often coughs after eating.  ED Course: Patient was noted to have increased respiratory effort on arrival.  Chest x-ray indicated hyperinflation.  Diffuse increased interstitial opacity, suspect for acute interstitial inflammatory process or mild underlying chronic change.  More confluent airspace disease at the lingula and left base suspicious for pneumonia.  Small left pleural effusion.  EKG did not show any acute changes.  Remainder of lab work was relatively unrevealing.  BNP was 119.  Patient has been referred for admission.  Review of Systems: As per HPI otherwise 10 point review of systems negative.    Past Medical History:  Diagnosis Date  . A-fib ([place])   . Abdominal pain   . Adenomatous colon polyp   . Arthritis   . Asthma   . CAD (coronary artery disease)   . Cancer ([place])    skin, nose  . Cataract    bilateral  . CHF (congestive heart failure) ([place])    patient reports  . Chronic LBP   . COPD (chronic obstructive pulmonary disease) ([place])   . Decreased libido   . Diverticulosis of colon   . Esophageal stricture   . Fatigue   . GERD (gastroesophageal reflux disease)   . History of small bowel obstruction   . Hyperlipidemia   . Hypertension   . Hypertrophy of prostate with urinary obstruction and other lower urinary tract symptoms (LUTS)   . Palpitations   . Peri-rectal abscess   . Personal history of renal calculi   . Prostatitis, acute   . Stroke [facility])    pt reports  . Tobacco abuse     Past Surgical History:  Procedure Laterality Date  . CATARACT EXTRACTION  04/2010   bilateral  . HERNIA REPAIR    . LUMBAR LAMINECTOMY     with Harrington rods  . PENILE PROSTHESIS PLACEMENT    . TONSILLECTOMY       reports that he has been smoking cigarettes.  He has been smoking about 1.00 pack per day. He has never used smokeless tobacco. He reports that he does not drink alcohol or use drugs.  Allergies  Allergen Reactions  . Procaine Other (See Comments) and Shortness Of Breath    Syncope  . Procaine Hcl Anaphylaxis and Swelling    Family History  Problem Relation Age of Onset  . Heart disease Brother        CAD  . Diabetes  Brother   . COPD Brother   . Emphysema Brother     Prior to Admission medications   Medication Sig Start Date End Date Taking? Authorizing Provider  acetaminophen (TYLENOL) 650 MG CR tablet Take 650 mg by mouth every 8 (eight) hours as needed for pain.    [provider]  albuterol (PROVENTIL) (2.5 MG/3ML) 0.083% nebulizer solution Take 3 mLs (2.5 mg total) by nebulization every 4 (four) hours as needed for wheezing or shortness of breath. 07/03/17   Arrien, Jimmy Picket, MD  ALPRAZolam Duanne Moron) 0.25 MG tablet Take 1 tablet (0.25 mg total) by mouth 3 (three) times daily as needed for anxiety. 07/03/17   Arrien, Jimmy Picket, MD  diltiazem  [facility]) 300 MG 24 hr capsule Take 1 capsule (300 mg total) by mouth daily. 07/03/17 11/17/17  Arrien, Jimmy Picket, MD  diphenhydrAMINE (BENADRYL) 25 MG tablet Take 1 tablet by mouth at bedtime.    [provider]  Fluticasone-Salmeterol (ADVAIR DISKUS) 500-50 MCG/DOSE AEPB Inhale 1 puff into the lungs 2 (two) times daily. 07/03/17 11/17/17  Arrien, Jimmy Picket, MD  furosemide (LASIX) 40 MG tablet Take 1 tablet (40 mg total) by mouth daily. 07/03/17   Arrien, Jimmy Picket, MD  glipiZIDE (GLUCOTROL XL) 5 MG 24 hr tablet Take 2.5 mg by mouth daily.  10/03/16   [provider]  ibuprofen (ADVIL,MOTRIN) 200 MG tablet Take 1 tablet (200 mg total) by mouth every 8 (eight) hours as needed (back pain). 07/03/17   Arrien, Jimmy Picket, MD  insulin aspart (NOVOLOG) 100 UNIT/ML FlexPen Check CBG's there etimes daily before meals. For CBG less than 150: 0 Units For CBG 150-200: 2 Units SQ For CBG 200-250: 4 Units SQ For CBG 250-300: 6 Units SQ For CBG 300-350: 8 Units SQ and call PCP for further instructions 07/03/17   Arrien, Jimmy Picket, MD  insulin glargine (LANTUS) 100 unit/mL SOPN Inject 0.25 mLs (25 Units total) into the skin daily. Patient taking differently: Inject 10 Units into the skin daily.  07/03/17 11/17/17  Arrien, Jimmy Picket, MD  Melatonin 3 MG TABS Take 1 tablet by mouth at bedtime.    [provider]  nicotine (NICODERM CQ - DOSED IN MG/24 HOURS) 21 mg/24hr patch Place 1 patch (21 mg total) onto the skin daily. Patient not taking: Reported on 11/17/2017 07/03/17   Arrien, Jimmy Picket, MD  omeprazole (PRILOSEC) 40 MG capsule Take 40 mg by mouth daily.    [provider]  rosuvastatin (CRESTOR) 20 MG tablet Take 1 tablet (20 mg total) by mouth daily. 07/03/17   Arrien, Jimmy Picket, MD  tiotropium (SPIRIVA HANDIHALER) 18 MCG inhalation capsule Place 1 capsule (18 mcg total) into inhaler and inhale daily. 07/03/17 07/03/18  Tawni Millers, MD    Physical Exam: Vitals:   12/26/17 1530 12/26/17 1545 12/26/17 1600 12/26/17 1700  BP: 119/78  123/72 (!) 142/83  Pulse: 72 70 79 72  Resp: (!) 24  20 (!) 24  Temp:    98.2 F (36.8 C)  TempSrc:    Oral  SpO2: 91% 90% 92% 92%  Weight:    76.4 kg (168 lb 6.4 oz)  Height:    5\' 5"  (1.651 m)    Constitutional: NAD, calm, comfortable Vitals:   12/26/17 1530 12/26/17 1545 12/26/17 1600 12/26/17 1700  BP: 119/78  123/72 (!) 142/83  Pulse: 72 70 79 72  Resp: (!) 24  20 (!) 24  Temp:    98.2 F (36.8 C)  TempSrc:    Oral  SpO2: 91% 90% 92% 92%  Weight:    76.4 kg (168 lb 6.4 oz)  Height:    5\' 5"  (1.651 m)   Eyes: PERRL, lids and conjunctivae normal ENMT: Mucous membranes are moist. Posterior pharynx clear of any exudate or lesions.Normal dentition.  Neck: normal, supple, no masses, no thyromegaly Respiratory: Bilateral crackles with mild wheeze, increased respiratory effort. No accessory muscle use.  Cardiovascular: Regular rate and rhythm, no murmurs / rubs / gallops.  2+ extremity edema. 2+ pedal pulses. No carotid bruits.  Abdomen: no tenderness, no masses palpated. No hepatosplenomegaly. Bowel sounds positive.  Musculoskeletal: no clubbing / cyanosis. No joint deformity upper and lower extremities. Good ROM, no contractures. Normal muscle tone.  Skin: no rashes, lesions, ulcers. No induration Neurologic: CN 2-12 grossly intact. Sensation intact, DTR normal. Strength 5/5 in all 4.  Psychiatric: Normal judgment and insight. Alert and oriented x 3. Normal mood.    Labs on Admission: I have personally reviewed following labs and imaging studies  CBC: Recent Labs  Lab 12/26/17 1335  WBC 11.0*  NEUTROABS 9.8*  HGB 13.8  HCT 47.0  MCV 87.2  PLT 341   Basic Metabolic Panel: Recent Labs  Lab 12/26/17 1335  NA 144  K 4.1  CL 96*  CO2 35*  GLUCOSE 165*  BUN 13  CREATININE 0.89  CALCIUM 9.2   GFR: Estimated Creatinine Clearance: 64.3 mL/min  (by C-G formula based on SCr of 0.89 mg/dL). Liver Function Tests: Recent Labs  Lab 12/26/17 1335  AST 12*  ALT 16*  ALKPHOS 85  BILITOT 0.8  PROT 7.1  ALBUMIN 3.6   No results for input(s): LIPASE, AMYLASE in the last 168 hours. No results for input(s): AMMONIA in the last 168 hours. Coagulation Profile: No results for input(s): INR, PROTIME in the last 168 hours. Cardiac Enzymes: Recent Labs  Lab 12/26/17 1335  TROPONINI <0.03   BNP (last 3 results) No results for input(s): PROBNP in the last 8760 hours. HbA1C: No results for input(s): HGBA1C in the last 72 hours. CBG: No results for input(s): GLUCAP in the last 168 hours. Lipid Profile: No results for input(s): CHOL, HDL, LDLCALC, TRIG, CHOLHDL, LDLDIRECT in the last 72 hours. Thyroid Function Tests: No results for input(s): TSH, T4TOTAL, FREET4, T3FREE, THYROIDAB in the last 72 hours. Anemia Panel: No results for input(s): VITAMINB12, FOLATE, FERRITIN, TIBC, IRON, RETICCTPCT in the last 72 hours. Urine analysis:    Component Value Date/Time   COLORURINE AMBER (A) 05/05/2013 1621   APPEARANCEUR CLEAR 05/05/2013 1621   LABSPEC 1.025 05/05/2013 1621   PHURINE 5.5 05/05/2013 1621   GLUCOSEU NEGATIVE 05/05/2013 1621   HGBUR NEGATIVE 05/05/2013 1621   HGBUR negative 03/31/2009 1514   BILIRUBINUR SMALL (A) 05/05/2013 1621   KETONESUR NEGATIVE 05/05/2013 1621   PROTEINUR NEGATIVE 05/05/2013 1621   UROBILINOGEN 1.0 05/05/2013 1621   NITRITE NEGATIVE 05/05/2013 1621   LEUKOCYTESUR NEGATIVE 05/05/2013 1621    Radiological Exams on Admission: Dg Chest 2 View  Result Date: 12/26/2017 CLINICAL DATA:  Short of breath EXAM: CHEST - 2 VIEW COMPARISON:  11/19/2017, 10/06/2017, 06/29/2017, CT chest 06/28/2017 FINDINGS: Hyperinflation. Small left pleural effusion. Increased interstitial opacity compared to prior. Patchy more confluent opacity at the lingula and left base. Stable cardiomediastinal silhouette. No pneumothorax.  IMPRESSION: 1. Hyperinflation. Diffuse increased interstitial opacity, suspect for acute interstitial inflammatory process on mild underlying chronic change. More confluent airspace disease at the lingula and left base, suspicious for  pneumonia 2. Small left pleural effusion Electronically Signed   By: Donavan Foil M.D.   On: 12/26/2017 14:36    EKG: Independently reviewed.  Sinus tachycardia with supraventricular bigeminy, unchanged from prior EKG.  Assessment/Plan Active Problems:   Diabetes mellitus (HCC)   HLD (hyperlipidemia)   TOBACCO ABUSE   ATRIAL FIBRILLATION   CHRONIC OBSTRUCTIVE PULMONARY DISEASE, ACUTE EXACERBATION   Acute on chronic respiratory failure with hypoxia (HCC)   Acute respiratory failure (HCC)   Aspiration pneumonia (HCC)   Dysphagia   Acute on chronic diastolic CHF (congestive heart failure) ([place])     1. Acute on chronic respiratory failure.  Multifactorial, related to COPD, probable aspiration as well as a component of CHF.  Will try and wean down oxygen as tolerated. 2. Aspiration pneumonia.  Patient has a history of dysphasia.  Does not appear to be he is compliant with his diet.  Start on Unasyn with pulmonary hygiene.  Will request speech therapy to consider repeat swallow evaluation for updated recommendations on diet.  He is been counseled on the importance of compliance with dietary restrictions 3. COPD exacerbation.  Continues to have rhonchi and wheezing.  Will continue on bronchodilators and start intravenous steroids. 4. Acute on chronic diastolic congestive heart failure.  Has evidence of lower extremity edema.  Started on intravenous Lasix.  Will hold off on beta-blockers for now since he is actively short of breath and wheezing.  Started on low-dose losartan.  Update echocardiogram. 5. Atrial fibrillation.  History of paroxysmal atrial fibrillation.  Currently appears to be in sinus rhythm.  Continue on diltiazem for rate control.  He was on  anticoagulation in the past, but at some point this was taken off of his list.  Review of records does not have any clear reason that anticoagulation was discontinued.  After discussing with the patient, he does not report any history of falls or significant bleeding.  We will start him back on apixaban.  CHADSVASc score of at least 6 6. Diabetes.  Continue on home dose of Lantus.  Supplement with sliding scale.  Hold oral agents for now. 7. Hyperlipidemia.  Continue on statin 8. Hypertension.  Continue on diltiazem.  He is also started on losartan.  Follow blood pressure. 9. Tobacco use.  Counseled extensively on the importance of tobacco cessation.  We will continue nicotine patch.  DVT prophylaxis: Apixaban Code Status: Full code, confirmed with patient Family Communication: Discussed with family at the bedside Disposition Plan: Discharge home once improved Consults called:   Admission status: Inpatient, telemetry  Kathie Dike MD [facility] Pager [phone]  If 7PM-7AM, please contact night-coverage [email or web address] Password [facility]  12/26/2017, 7:20 PM

## 2017-12-26 NOTE — Progress Notes (Signed)
ANTICOAGULATION CONSULT NOTE - Initial Consult  Pharmacy Consult for apixaban Indication: atrial fibrillation  Allergies  Allergen Reactions  . Procaine Other (See Comments) and Shortness Of Breath    Syncope  . Procaine Hcl Anaphylaxis and Swelling    Patient Measurements: Height: 5\' 5"  (165.1 cm) Weight: 168 lb 6.4 oz (76.4 kg) IBW/kg (Calculated) : 61.5   Vital Signs: Temp: 98.2 F (36.8 C) (04/23 1700) Temp Source: Oral (04/23 1700) BP: 142/83 (04/23 1700) Pulse Rate: 72 (04/23 1700)  Labs: Recent Labs    12/26/17 1335  HGB 13.8  HCT 47.0  PLT 232  CREATININE 0.89  TROPONINI <0.03    Estimated Creatinine Clearance: 64.3 mL/min (by C-G formula based on SCr of 0.89 mg/dL).   Medical History: Past Medical History:  Diagnosis Date  . A-fib ([place])   . Abdominal pain   . Adenomatous colon polyp   . Arthritis   . Asthma   . CAD (coronary artery disease)   . Cancer ([place])    skin, nose  . Cataract    bilateral  . CHF (congestive heart failure) ([place])    patient reports  . Chronic LBP   . COPD (chronic obstructive pulmonary disease) ([place])   . Decreased libido   . Diverticulosis of colon   . Esophageal stricture   . Fatigue   . GERD (gastroesophageal reflux disease)   . History of small bowel obstruction   . Hyperlipidemia   . Hypertension   . Hypertrophy of prostate with urinary obstruction and other lower urinary tract symptoms (LUTS)   . Palpitations   . Peri-rectal abscess   . Personal history of renal calculi   . Prostatitis, acute   . Stroke [facility])    pt reports  . Tobacco abuse     Medications:  Medications Prior to Admission  Medication Sig Dispense Refill Last Dose  . acetaminophen (TYLENOL) 650 MG CR tablet Take 650 mg by mouth every 8 (eight) hours as needed for pain.   Past Week at Unknown time  . albuterol (PROVENTIL) (2.5 MG/3ML) 0.083% nebulizer solution Take 3 mLs (2.5 mg total) by nebulization every 4 (four) hours as needed for  wheezing or shortness of breath. 75 mL 12   . ALPRAZolam (XANAX) 0.25 MG tablet Take 1 tablet (0.25 mg total) by mouth 3 (three) times daily as needed for anxiety. 10 tablet 0 11/17/2017 at Unknown time  . diltiazem (TIAZAC) 300 MG 24 hr capsule Take 1 capsule (300 mg total) by mouth daily. 30 capsule 0 11/17/2017 at Unknown time  . diphenhydrAMINE (BENADRYL) 25 MG tablet Take 1 tablet by mouth at bedtime.   unknown  . Fluticasone-Salmeterol (ADVAIR DISKUS) 500-50 MCG/DOSE AEPB Inhale 1 puff into the lungs 2 (two) times daily. 60 each 0 11/17/2017 at Unknown time  . furosemide (LASIX) 40 MG tablet Take 1 tablet (40 mg total) by mouth daily. 30 tablet 0 11/17/2017 at Unknown time  . glipiZIDE (GLUCOTROL XL) 5 MG 24 hr tablet Take 2.5 mg by mouth daily.    Past Week at Unknown time  . ibuprofen (ADVIL,MOTRIN) 200 MG tablet Take 1 tablet (200 mg total) by mouth every 8 (eight) hours as needed (back pain). 30 tablet 0 11/17/2017 at Unknown time  . insulin aspart (NOVOLOG) 100 UNIT/ML FlexPen Check CBG's there etimes daily before meals. For CBG less than 150: 0 Units For CBG 150-200: 2 Units SQ For CBG 200-250: 4 Units SQ For CBG 250-300: 6 Units SQ For CBG 300-350:  8 Units SQ and call PCP for further instructions 15 mL 0 11/16/2017 at Unknown time  . insulin glargine (LANTUS) 100 unit/mL SOPN Inject 0.25 mLs (25 Units total) into the skin daily. (Patient taking differently: Inject 10 Units into the skin daily. ) 7.5 mL 0 11/16/2017 at Unknown time  . Melatonin 3 MG TABS Take 1 tablet by mouth at bedtime.   unknown  . nicotine (NICODERM CQ - DOSED IN MG/24 HOURS) 21 mg/24hr patch Place 1 patch (21 mg total) onto the skin daily. (Patient not taking: Reported on 11/17/2017) 28 patch 0 Not Taking at Unknown time  . omeprazole (PRILOSEC) 40 MG capsule Take 40 mg by mouth daily.   11/17/2017 at Unknown time  . rosuvastatin (CRESTOR) 20 MG tablet Take 1 tablet (20 mg total) by mouth daily. 30 tablet 0 11/17/2017 at  Unknown time  . tiotropium (SPIRIVA HANDIHALER) 18 MCG inhalation capsule Place 1 capsule (18 mcg total) into inhaler and inhale daily. 30 capsule 2 11/17/2017 at Unknown time    Assessment: Pharmacy has been consulted to dose apixaban for patient with a history of atrial fibrillation.  Goal of Therapy:   Monitor platelets by anticoagulation protocol: Yes   Plan:  Apixaban 5 mg BID Monitor labs and s/s of bleeding  Ramond Craver 12/26/2017,7:45 PM

## 2017-12-26 NOTE — Progress Notes (Signed)
  Pharmacy Antibiotic Note  Andrew [name] is a 77 y.o. male admitted on 12/26/2017 with pneumonia.  Pharmacy has been consulted for Vancomycin and Cefepime dosing.  Plan: Vancomycin 1750 mg IV x 1 dose Vancomycin 750 mg IV every 12 hours.  Goal trough 15-20 mcg/mL. Cefepime 1000 mg IV q8 hours  Monitor labs, c/s, and vanco trough as indicated  Height: 5\' 5"  (165.1 cm) Weight: 171 lb (77.6 kg) IBW/kg (Calculated) : 61.5  Temp (24hrs), Avg:99.1 F (37.3 C), Min:99.1 F (37.3 C), Max:99.1 F (37.3 C)  Recent Labs  Lab 12/26/17 1335  WBC 11.0*  CREATININE 0.89    Estimated Creatinine Clearance: 64.6 mL/min (by C-G formula based on SCr of 0.89 mg/dL).    Allergies  Allergen Reactions  . Procaine Other (See Comments) and Shortness Of Breath    Syncope  . Procaine Hcl Anaphylaxis and Swelling    Antimicrobials this admission: Vanco 4/23 >>  Cefepime 4/23 >>   Dose adjustments this admission: N/A  Microbiology results: Pending  Thank you for allowing pharmacy to be a part of this patient's care.  Ramond Craver 12/26/2017 4:10 PM

## 2017-12-26 NOTE — ED Provider Notes (Addendum)
[facility] EMERGENCY DEPARTMENT Provider Note   CSN: [id number] Arrival date & time: 12/26/17  1314     History   Chief Complaint Chief Complaint  Patient presents with  . Shortness of Breath    HPI Andrew [name] is a 77 y.o. male.  Level 5 caveat for urgent need for intervention.  Patient with known history of severe COPD presents with worsening dyspnea for the past 2 weeks.  No substernal chest pain.  He has tried his nebulizer treatments at home with minimal success.  He was admitted to the hospital on 11/17/2017 for COPD exacerbation.  He has a known history of COPD, heart failure, atrial fibrillation, CAD, hypertension, stroke, tobacco, many others.  Home oxygen at night.  He also reports weakness and inability to perform his ADLs.     Past Medical History:  Diagnosis Date  . A-fib ([place])   . Abdominal pain   . Adenomatous colon polyp   . Arthritis   . Asthma   . CAD (coronary artery disease)   . Cancer ([place])    skin, nose  . Cataract    bilateral  . CHF (congestive heart failure) ([place])    patient reports  . Chronic LBP   . COPD (chronic obstructive pulmonary disease) ([place])   . Decreased libido   . Diverticulosis of colon   . Esophageal stricture   . Fatigue   . GERD (gastroesophageal reflux disease)   . History of small bowel obstruction   . Hyperlipidemia   . Hypertension   . Hypertrophy of prostate with urinary obstruction and other lower urinary tract symptoms (LUTS)   . Palpitations   . Peri-rectal abscess   . Personal history of renal calculi   . Prostatitis, acute   . Stroke [facility])    pt reports  . Tobacco abuse     Patient Active Problem List   Diagnosis Date Noted  . Acute respiratory failure ([place]) 12/26/2017  . Acute on chronic respiratory failure with hypoxia ([place]) 11/17/2017  . COPD with exacerbation ([place]) 11/17/2017  . Hospital acquired PNA 05/14/2017  . COPD exacerbation ([place])   . HCAP (healthcare-associated pneumonia)   . Acute  respiratory failure with hypoxia and hypercapnia ([place]) 04/12/2017  . Pneumonia 10/24/2016  . AAA (abdominal aortic aneurysm) ([place]) 01/04/2011  . Dizziness, nonspecific 12/26/2010  . UNSPECIFIED PERIPHERAL VASCULAR DISEASE 09/23/2010  . LEG CRAMPS 09/13/2010  . Diabetes mellitus ([place]) 02/11/2010  . CHRONIC OBSTRUCTIVE PULMONARY DISEASE, ACUTE EXACERBATION 02/11/2010  . OLECRANON BURSITIS, RIGHT 01/26/2010  . PENILE PAIN 12/29/2009  . ABNORMAL EJACULATION 05/05/2009  . LARYNGITIS, ACUTE 04/21/2009  . ATRIAL FIBRILLATION 04/08/2009  . PALPITATIONS 04/08/2009  . FATIGUE 03/31/2009  . LIBIDO, DECREASED 03/31/2009  . HYPERTROPHY PROSTATE W/UR OBST & OTH LUTS 03/25/2009  . ACUTE PROSTATITIS 03/25/2009  . SMALL BOWEL OBSTRUCTION, HX OF 11/28/2007  . Hyperlipidemia 11/13/2007  . COPD 10/02/2007  . LOW BACK PAIN, CHRONIC 10/02/2007  . ADENOMATOUS COLONIC POLYP 05/22/2007  . ESOPHAGEAL STRICTURE 05/22/2007  . DIVERTICULOSIS, COLON 05/22/2007  . TOBACCO ABUSE 05/08/2007  . Essential hypertension 05/08/2007  . CORONARY ARTERY DISEASE 05/08/2007  . ASTHMA 05/08/2007  . GERD 05/08/2007  . RENAL CALCULUS, HX OF 05/08/2007  . ABSCESS, PERIRECTAL, HX OF 05/08/2007  . ARTHRITIS, HX OF 05/08/2007  . PENILE PROSTHESIS 05/08/2007  . LAMINECTOMY, LUMBAR, HX OF 05/08/2007    Past Surgical History:  Procedure Laterality Date  . CATARACT EXTRACTION  04/2010   bilateral  . HERNIA REPAIR    .  LUMBAR LAMINECTOMY     with Harrington rods  . PENILE PROSTHESIS PLACEMENT    . TONSILLECTOMY          Home Medications    Prior to Admission medications   Medication Sig Start Date End Date Taking? Authorizing Provider  acetaminophen (TYLENOL) 650 MG CR tablet Take 650 mg by mouth every 8 (eight) hours as needed for pain.    [provider]  albuterol (PROVENTIL) (2.5 MG/3ML) 0.083% nebulizer solution Take 3 mLs (2.5 mg total) by nebulization every 4 (four) hours as needed for wheezing or  shortness of breath. 07/03/17   Arrien, Jimmy Picket, MD  ALPRAZolam Duanne Moron) 0.25 MG tablet Take 1 tablet (0.25 mg total) by mouth 3 (three) times daily as needed for anxiety. 07/03/17   Arrien, Jimmy Picket, MD  diltiazem [facility]) 300 MG 24 hr capsule Take 1 capsule (300 mg total) by mouth daily. 07/03/17 11/17/17  Arrien, Jimmy Picket, MD  diphenhydrAMINE (BENADRYL) 25 MG tablet Take 1 tablet by mouth at bedtime.    [provider]  Fluticasone-Salmeterol (ADVAIR DISKUS) 500-50 MCG/DOSE AEPB Inhale 1 puff into the lungs 2 (two) times daily. 07/03/17 11/17/17  Arrien, Jimmy Picket, MD  furosemide (LASIX) 40 MG tablet Take 1 tablet (40 mg total) by mouth daily. 07/03/17   Arrien, Jimmy Picket, MD  glipiZIDE (GLUCOTROL XL) 5 MG 24 hr tablet Take 2.5 mg by mouth daily.  10/03/16   [provider]  ibuprofen (ADVIL,MOTRIN) 200 MG tablet Take 1 tablet (200 mg total) by mouth every 8 (eight) hours as needed (back pain). 07/03/17   Arrien, Jimmy Picket, MD  insulin aspart (NOVOLOG) 100 UNIT/ML FlexPen Check CBG's there etimes daily before meals. For CBG less than 150: 0 Units For CBG 150-200: 2 Units SQ For CBG 200-250: 4 Units SQ For CBG 250-300: 6 Units SQ For CBG 300-350: 8 Units SQ and call PCP for further instructions 07/03/17   Arrien, Jimmy Picket, MD  insulin glargine (LANTUS) 100 unit/mL SOPN Inject 0.25 mLs (25 Units total) into the skin daily. Patient taking differently: Inject 10 Units into the skin daily.  07/03/17 11/17/17  Arrien, Jimmy Picket, MD  Melatonin 3 MG TABS Take 1 tablet by mouth at bedtime.    [provider]  nicotine (NICODERM CQ - DOSED IN MG/24 HOURS) 21 mg/24hr patch Place 1 patch (21 mg total) onto the skin daily. Patient not taking: Reported on 11/17/2017 07/03/17   Arrien, Jimmy Picket, MD  omeprazole (PRILOSEC) 40 MG capsule Take 40 mg by mouth daily.    [provider]  rosuvastatin (CRESTOR) 20 MG tablet  Take 1 tablet (20 mg total) by mouth daily. 07/03/17   Arrien, Jimmy Picket, MD  tiotropium (SPIRIVA HANDIHALER) 18 MCG inhalation capsule Place 1 capsule (18 mcg total) into inhaler and inhale daily. 07/03/17 07/03/18  Arrien, Jimmy Picket, MD    Family History Family History  Problem Relation Age of Onset  . Heart disease Brother        CAD  . Diabetes Brother   . COPD Brother   . Emphysema Brother     Social History Social History   Tobacco Use  . Smoking status: Current Every Day Smoker    Packs/day: 1.00    Types: Cigarettes  . Smokeless tobacco: Never Used  Substance Use Topics  . Alcohol use: No  . Drug use: No     Allergies   Procaine and Procaine hcl   Review of Systems Review of  Systems  All other systems reviewed and are negative.    Physical Exam Updated Vital Signs BP 123/72   Pulse 79   Temp 99.1 F (37.3 C) (Oral)   Resp 20   Ht 5\' 5"  (1.651 m)   Wt 77.6 kg (171 lb)   SpO2 92%   BMI 28.46 kg/m   Physical Exam  Constitutional: He is oriented to person, place, and time.  Respiratory distress; dyspneic and tachypneic.  HENT:  Head: Normocephalic and atraumatic.  Eyes: Conjunctivae are normal.  Neck: Neck supple.  Cardiovascular: Normal rate and regular rhythm.  Pulmonary/Chest:  Scattered rhonchi and wheezes.  Abdominal: Soft. Bowel sounds are normal.  Musculoskeletal: Normal range of motion.  Neurological: He is alert and oriented to person, place, and time.  Skin: Skin is warm and dry.  Psychiatric: He has a normal mood and affect. His behavior is normal.  Nursing note and vitals reviewed.    ED Treatments / Results  Labs (all labs ordered are listed, but only abnormal results are displayed) Labs Reviewed  CBC WITH DIFFERENTIAL/PLATELET - Abnormal; Notable for the following components:      Result Value   WBC 11.0 (*)    MCH 25.6 (*)    MCHC 29.4 (*)    RDW 17.5 (*)    Neutro Abs 9.8 (*)    All other components  within normal limits  BRAIN NATRIURETIC PEPTIDE - Abnormal; Notable for the following components:   B Natriuretic Peptide 119.0 (*)    All other components within normal limits  COMPREHENSIVE METABOLIC PANEL - Abnormal; Notable for the following components:   Chloride 96 (*)    CO2 35 (*)    Glucose, Bld 165 (*)    AST 12 (*)    ALT 16 (*)    All other components within normal limits  TROPONIN I    EKG None  Radiology Dg Chest 2 View  Result Date: 12/26/2017 CLINICAL DATA:  Short of breath EXAM: CHEST - 2 VIEW COMPARISON:  11/19/2017, 10/06/2017, 06/29/2017, CT chest 06/28/2017 FINDINGS: Hyperinflation. Small left pleural effusion. Increased interstitial opacity compared to prior. Patchy more confluent opacity at the lingula and left base. Stable cardiomediastinal silhouette. No pneumothorax. IMPRESSION: 1. Hyperinflation. Diffuse increased interstitial opacity, suspect for acute interstitial inflammatory process on mild underlying chronic change. More confluent airspace disease at the lingula and left base, suspicious for pneumonia 2. Small left pleural effusion Electronically Signed   By: Donavan Foil M.D.   On: 12/26/2017 14:36    Procedures Procedures (including critical care time)  Medications Ordered in ED Medications  ceFEPIme (MAXIPIME) 1 g in sodium chloride 0.9 % 100 mL IVPB (1 g Intravenous New Bag/Given 12/26/17 1615)  vancomycin (VANCOCIN) 1,750 mg in sodium chloride 0.9 % 500 mL IVPB (has no administration in time range)  vancomycin (VANCOCIN) IVPB 750 mg/150 ml premix (has no administration in time range)  furosemide (LASIX) injection 40 mg (40 mg Intravenous Given 12/26/17 1422)  methylPREDNISolone sodium succinate (SOLU-MEDROL) 125 mg/2 mL injection 60 mg (60 mg Intravenous Given 12/26/17 1422)  ipratropium-albuterol (DUONEB) 0.5-2.5 (3) MG/3ML nebulizer solution 3 mL (3 mLs Nebulization Given 12/26/17 1408)     Initial Impression / Assessment and Plan / ED Course    I have reviewed the triage vital signs and the nursing notes.  Pertinent labs & imaging results that were available during my care of the patient were reviewed by me and considered in my medical decision making (see chart for details).  She with known COPD presents with respiratory extremis.  Chest x-ray shows probable airspace disease in the lingula and left base.  He responded well to nebulizer treatment, steroids, supplemental oxygen.  Will start antibiotics for HCAP.  Discussed with hospitalist.   CRITICAL CARE Performed by: Nat Christen  ?  Total critical care time: 30 minutes  Critical care time was exclusive of separately billable procedures and treating other patients.  Critical care was necessary to treat or prevent imminent or life-threatening deterioration.  Critical care was time spent personally by me on the following activities: development of treatment plan with patient and/or surrogate as well as nursing, discussions with consultants, evaluation of patient's response to treatment, examination of patient, obtaining history from patient or surrogate, ordering and performing treatments and interventions, ordering and review of laboratory studies, ordering and review of radiographic studies, pulse oximetry and re-evaluation of patient's condition. Final Clinical Impressions(s) / ED Diagnoses   Final diagnoses:  COPD exacerbation ([place])  Acute on chronic respiratory failure, unspecified whether with hypoxia or hypercapnia ([place])  HCAP (healthcare-associated pneumonia)    ED Discharge Orders    None       Nat Christen, MD 12/26/17 1641    Nat Christen, MD 12/26/17 [phone]

## 2017-12-27 ENCOUNTER — Inpatient Hospital Stay (HOSPITAL_COMMUNITY): Payer: Medicare PPO

## 2017-12-27 DIAGNOSIS — J9621 Acute and chronic respiratory failure with hypoxia: Secondary | ICD-10-CM

## 2017-12-27 DIAGNOSIS — E1165 Type 2 diabetes mellitus with hyperglycemia: Secondary | ICD-10-CM

## 2017-12-27 DIAGNOSIS — J441 Chronic obstructive pulmonary disease with (acute) exacerbation: Secondary | ICD-10-CM

## 2017-12-27 DIAGNOSIS — R1310 Dysphagia, unspecified: Secondary | ICD-10-CM

## 2017-12-27 DIAGNOSIS — I5033 Acute on chronic diastolic (congestive) heart failure: Secondary | ICD-10-CM

## 2017-12-27 DIAGNOSIS — I480 Paroxysmal atrial fibrillation: Secondary | ICD-10-CM

## 2017-12-27 DIAGNOSIS — Z794 Long term (current) use of insulin: Secondary | ICD-10-CM

## 2017-12-27 DIAGNOSIS — J690 Pneumonitis due to inhalation of food and vomit: Principal | ICD-10-CM

## 2017-12-27 DIAGNOSIS — F17200 Nicotine dependence, unspecified, uncomplicated: Secondary | ICD-10-CM

## 2017-12-27 LAB — BASIC METABOLIC PANEL
Anion gap: 12 (ref 5–15)
BUN: 20 mg/dL (ref 6–20)
CALCIUM: 9.1 mg/dL (ref 8.9–10.3)
CO2: 37 mmol/L — ABNORMAL HIGH (ref 22–32)
CREATININE: 0.71 mg/dL (ref 0.61–1.24)
Chloride: 94 mmol/L — ABNORMAL LOW (ref 101–111)
Glucose, Bld: 211 mg/dL — ABNORMAL HIGH (ref 65–99)
Potassium: 4.1 mmol/L (ref 3.5–5.1)
Sodium: 143 mmol/L (ref 135–145)

## 2017-12-27 LAB — GLUCOSE, CAPILLARY
GLUCOSE-CAPILLARY: 243 mg/dL — AB (ref 65–99)
GLUCOSE-CAPILLARY: 378 mg/dL — AB (ref 65–99)
GLUCOSE-CAPILLARY: 223 mg/dL — AB (ref 65–99)
GLUCOSE-CAPILLARY: 155 mg/dL — AB (ref 65–99)

## 2017-12-27 LAB — ECHOCARDIOGRAM COMPLETE
HEIGHTINCHES: 65 in
WEIGHTICAEL: 2694.4 [oz_av]

## 2017-12-27 MED ORDER — IPRATROPIUM-ALBUTEROL 0.5-2.5 (3) MG/3ML IN SOLN
3.0000 mL | RESPIRATORY_TRACT | Status: DC
  Administered 2017-12-27 – 2017-12-30 (×17): 3 mL via RESPIRATORY_TRACT
  Filled 2017-12-27 (×18): qty 3

## 2017-12-27 MED ORDER — INSULIN ASPART 100 UNIT/ML ~~LOC~~ SOLN
8.0000 [IU] | Freq: Three times a day (TID) | SUBCUTANEOUS | Status: DC
  Administered 2017-12-27 (×2): 8 [IU] via SUBCUTANEOUS

## 2017-12-27 MED ORDER — METHYLPREDNISOLONE SODIUM SUCC 40 MG IJ SOLR: 40.0000 mg | Freq: Three times a day (TID) | INTRAMUSCULAR | Status: DC

## 2017-12-27 MED ORDER — INSULIN ASPART 100 UNIT/ML ~~LOC~~ SOLN
0.0000 [IU] | Freq: Every day | SUBCUTANEOUS | Status: DC
  Administered 2017-12-28: 3 [IU] via SUBCUTANEOUS

## 2017-12-27 MED ORDER — INSULIN GLARGINE 100 UNIT/ML ~~LOC~~ SOLN
34.0000 [IU] | Freq: Every day | SUBCUTANEOUS | Status: DC
  Administered 2017-12-27: 34 [IU] via SUBCUTANEOUS
  Filled 2017-12-27 (×2): qty 0.34

## 2017-12-27 MED ORDER — METHYLPREDNISOLONE SODIUM SUCC 40 MG IJ SOLR
40.0000 mg | Freq: Three times a day (TID) | INTRAMUSCULAR | Status: DC
  Administered 2017-12-27 (×3): 40 mg via INTRAVENOUS
  Filled 2017-12-27 (×3): qty 1

## 2017-12-27 MED ORDER — FUROSEMIDE 10 MG/ML IJ SOLN
60.0000 mg | Freq: Two times a day (BID) | INTRAMUSCULAR | Status: DC
  Administered 2017-12-27 – 2017-12-28 (×2): 60 mg via INTRAVENOUS
  Filled 2017-12-27 (×2): qty 6

## 2017-12-27 MED ORDER — INSULIN ASPART 100 UNIT/ML ~~LOC~~ SOLN
0.0000 [IU] | Freq: Three times a day (TID) | SUBCUTANEOUS | Status: DC
  Administered 2017-12-27: 7 [IU] via SUBCUTANEOUS
  Administered 2017-12-27: 20 [IU] via SUBCUTANEOUS
  Administered 2017-12-28: 3 [IU] via SUBCUTANEOUS
  Administered 2017-12-28: 7 [IU] via SUBCUTANEOUS
  Administered 2017-12-28: 11 [IU] via SUBCUTANEOUS
  Administered 2017-12-29: 3 [IU] via SUBCUTANEOUS
  Administered 2017-12-29: 15 [IU] via SUBCUTANEOUS
  Administered 2017-12-29: 11 [IU] via SUBCUTANEOUS
  Administered 2017-12-30: 15 [IU] via SUBCUTANEOUS

## 2017-12-27 NOTE — Progress Notes (Signed)
PROGRESS NOTE    Andrew [name]  WPY:[id number]  DOB: Dec 02, 1940  DOA: 12/26/2017 PCP: [facility]   Brief Admission Hx: Andrew [name] is a 77 y.o. male with medical history significant of COPD, chronic respiratory failure, chronic diastolic congestive heart failure, dysphagia with recurrent aspiration, presents to the hospital with complaints of shortness of breath .   MDM/Assessment & Plan:    1. Acute on chronic respiratory failure.  Multifactorial, related to COPD, probable aspiration as well as a component of CHF.  Will try and wean down oxygen as tolerated.  He is only slightly improved and needs ongoing acute inpatient treatment.  2. Aspiration pneumonia.  Patient has a history of dysphasia.  Does not appear to be he is compliant with his diet.  Continue unasyn with pulmonary hygiene.  Will request speech therapy to consider repeat swallow evaluation for updated recommendations on diet.  He is been counseled on the importance of compliance with dietary restrictions 3. COPD exacerbation.  Continues to have rhonchi and wheezing.  Will continue on bronchodilators and  intravenous steroids. 4. Acute on chronic diastolic congestive heart failure.  Has evidence of lower extremity edema.  Started on intravenous Lasix.  Will hold off on beta-blockers for now since he is actively short of breath and wheezing.  Started on low-dose losartan.  Update echocardiogram. 5. Atrial fibrillation.  History of paroxysmal atrial fibrillation.  Currently appears to be in sinus rhythm.  Continue on diltiazem for rate control.  He was on anticoagulation in the past, but at some point this was taken off of his list.  Review of records does not have any clear reason that anticoagulation was discontinued.  After discussing with the patient, he does not report any history of falls or significant bleeding.  We will start him back on apixaban.  CHADSVASc score of at least 6 6. Diabetes, type 2 with  hyperglycemia.  Continue on home dose of Lantus.  Supplement with sliding scale.  Hold oral agents for now. 7. Hyperlipidemia.  Continue on statin 8. Hypertension.  Continue on diltiazem.  He is also started on losartan.  Follow blood pressure. 9. Tobacco use.  Counseled extensively on the importance of tobacco cessation.  We will continue nicotine patch.  DVT prophylaxis: Apixaban Code Status: Full code, confirmed with patient Family Communication: Discussed with family at the bedside Disposition Plan: Discharge home once improved Consults called:   Admission status: Inpatient, telemetry  Subjective: Pt continues to wheeze, cough and have SOB.  Denies chest pain.  He continues to have edema in the legs.   Objective: Vitals:   12/27/17 0228 12/27/17 0636 12/27/17 0741 12/27/17 0750  BP:  135/70    Pulse:  78    Resp:      Temp:  97.7 F (36.5 C)    TempSrc:  Oral    SpO2: 95% 93% 93% 96%  Weight:      Height:        Intake/Output Summary (Last 24 hours) at 12/27/2017 0917 Last data filed at 12/27/2017 0636 Gross per 24 hour  Intake 800 ml  Output 475 ml  Net 325 ml   Filed Weights   12/26/17 1319 12/26/17 1700  Weight: 77.6 kg (171 lb) 76.4 kg (168 lb 6.4 oz)    REVIEW OF SYSTEMS  As per history otherwise all reviewed and reported negative  Exam:  General exam: awake, alert, NAD, cooperative.  Respiratory system: coarse rales, crackles heard.  Diffuse insp/exp wheezing heard.  No increased work of breathing. Cardiovascular system: S1 & S2 heard, RRR. No JVD, murmurs, gallops, clicks or pedal edema. Gastrointestinal system: Abdomen is nondistended, soft and nontender. Normal bowel sounds heard. Central nervous system: Alert and oriented. No focal neurological deficits. Extremities: 2+ pitting pretibial edema bilateral lower extremities.  Data Reviewed: Basic Metabolic Panel: Recent Labs  Lab 12/26/17 1335 12/27/17 0433  NA 144 143  K 4.1 4.1  CL 96* 94*    CO2 35* 37*  GLUCOSE 165* 211*  BUN 13 20  CREATININE 0.89 0.71  CALCIUM 9.2 9.1   Liver Function Tests: Recent Labs  Lab 12/26/17 1335  AST 12*  ALT 16*  ALKPHOS 85  BILITOT 0.8  PROT 7.1  ALBUMIN 3.6   No results for input(s): LIPASE, AMYLASE in the last 168 hours. No results for input(s): AMMONIA in the last 168 hours. CBC: Recent Labs  Lab 12/26/17 1335  WBC 11.0*  NEUTROABS 9.8*  HGB 13.8  HCT 47.0  MCV 87.2  PLT 232   Cardiac Enzymes: Recent Labs  Lab 12/26/17 1335  TROPONINI <0.03   CBG (last 3)  Recent Labs    12/26/17 2104 12/27/17 0812  GLUCAP 243* 243*   No results found for this or any previous visit (from the past 240 hour(s)).   Studies: Dg Chest 2 View  Result Date: 12/26/2017 CLINICAL DATA:  Short of breath EXAM: CHEST - 2 VIEW COMPARISON:  11/19/2017, 10/06/2017, 06/29/2017, CT chest 06/28/2017 FINDINGS: Hyperinflation. Small left pleural effusion. Increased interstitial opacity compared to prior. Patchy more confluent opacity at the lingula and left base. Stable cardiomediastinal silhouette. No pneumothorax. IMPRESSION: 1. Hyperinflation. Diffuse increased interstitial opacity, suspect for acute interstitial inflammatory process on mild underlying chronic change. More confluent airspace disease at the lingula and left base, suspicious for pneumonia 2. Small left pleural effusion Electronically Signed   By: Donavan Foil M.D.   On: 12/26/2017 14:36     Scheduled Meds: . apixaban  5 mg Oral BID  . diltiazem  300 mg Oral Daily  . furosemide  40 mg Intravenous BID  . guaiFENesin  1,200 mg Oral BID  . insulin aspart  0-15 Units Subcutaneous TID WC  . insulin aspart  0-5 Units Subcutaneous QHS  . insulin glargine  25 Units Subcutaneous QHS  . ipratropium-albuterol  3 mL Nebulization Q6H  . losartan  25 mg Oral Daily  . mouth rinse  15 mL Mouth Rinse BID  . methylPREDNISolone (SOLU-MEDROL) injection  40 mg Intravenous Q12H  .  mometasone-formoterol  2 puff Inhalation BID  . nicotine  21 mg Transdermal Daily  . pantoprazole  40 mg Oral Daily  . rosuvastatin  20 mg Oral q1800  . sodium chloride flush  3 mL Intravenous Q12H   Continuous Infusions: . sodium chloride    . ampicillin-sulbactam (UNASYN) IV Stopped (12/27/17 0330)    Active Problems:   Diabetes mellitus (HCC)   HLD (hyperlipidemia)   TOBACCO ABUSE   ATRIAL FIBRILLATION   CHRONIC OBSTRUCTIVE PULMONARY DISEASE, ACUTE EXACERBATION   Acute on chronic respiratory failure with hypoxia (H[facility])   Acute respiratory failure (H[facility])   Aspiration pneumonia (H[facility])   Dysphagia   Acute on chronic diastolic CHF (congestive heart failure) ([place])   Time spent:   Irwin Brakeman, MD, FAAFP [facility] Pager [phone]  If 7PM-7AM, please contact night-coverage [email or web address] Password [facility] 12/27/2017, 9:17 AM    LOS: 1 day

## 2017-12-27 NOTE — Evaluation (Signed)
Clinical/Bedside Swallow Evaluation Patient Details  Name: Andrew [name] MRN: [id number] Date of Birth: 23-Aug-1941  Today's Date: 12/27/2017 Time: SLP Start Time (ACUTE ONLY): 6962 SLP Stop Time (ACUTE ONLY): 1632 SLP Time Calculation (min) (ACUTE ONLY): 19 min  Past Medical History:  Past Medical History:  Diagnosis Date  . A-fib ([place])   . Abdominal pain   . Adenomatous colon polyp   . Arthritis   . Asthma   . CAD (coronary artery disease)   . Cancer ([place])    skin, nose  . Cataract    bilateral  . CHF (congestive heart failure) ([place])    patient reports  . Chronic LBP   . COPD (chronic obstructive pulmonary disease) ([place])   . Decreased libido   . Diverticulosis of colon   . Esophageal stricture   . Fatigue   . GERD (gastroesophageal reflux disease)   . History of small bowel obstruction   . Hyperlipidemia   . Hypertension   . Hypertrophy of prostate with urinary obstruction and other lower urinary tract symptoms (LUTS)   . Palpitations   . Peri-rectal abscess   . Personal history of renal calculi   . Prostatitis, acute   . Stroke [facility])    pt reports  . Tobacco abuse    Past Surgical History:  Past Surgical History:  Procedure Laterality Date  . CATARACT EXTRACTION  04/2010   bilateral  . HERNIA REPAIR    . LUMBAR LAMINECTOMY     with Harrington rods  . PENILE PROSTHESIS PLACEMENT    . TONSILLECTOMY     HPI:   Andrew [name] is a 77 y.o. male with medical history significant of COPD, chronic respiratory failure, chronic diastolic congestive heart failure, dysphagia with recurrent aspiration, presents to the hospital with complaints of shortness of breath.  Patient reports over the past week he has been having progressive shortness of breath.  Daughter reports that he has been having increasing cough, wheezing shortness of breath.  She is also noticed worsening lower extremity edema.  The patient continues to smoke cigarettes.  He is been using his nebulizer at home  without significant benefit.  He has not had any chest pain.  He is not had any fevers.  Daughter reports that he has had swallow evaluations in the past and has been told that he has dysphagia.  He was unable to tolerate thickened foods she notes that he often coughs after eating.   Assessment / Plan / Recommendation Clinical Impression  Pt was administered CSE with patient sitting unsupported on edge of bed. Note straws in cups on bedside table despite large written reminders for "no straws". Pt consumed thin liquids, regular textures and puree textures with no overt s/sx of aspiration however note impulsivity, and increased risk of aspiration secondary to compromised respiratory status and coordination of swallowing and respiration. Pt also demonstrates talking while eating increasing aspiration risk. Reinforce recommendations from most recent MBS 06/2017: "D3/mech soft (encourage moist textures) and thin liquids and no straws. Pt should clear throat/cough periodically to ensure clearance of liquids from laryngeal vestibule and repeat/dry swallow." Recommend ST f/u for diet tolerance and to ensure implementation and compliance of strategies X1. SLP Visit Diagnosis: Dysphagia, oropharyngeal phase (R13.12);Dysphagia, unspecified (R13.10)    Aspiration Risk  Mild aspiration risk;Moderate aspiration risk    Diet Recommendation Regular;Thin liquid   Liquid Administration via: Cup;No straw Medication Administration: Whole meds with liquid Supervision: Patient able to self feed Compensations: Minimize environmental distractions;Slow rate;Small  sips/bites;Follow solids with liquid;Clear throat intermittently Postural Changes: Seated upright at 90 degrees;Remain upright for at least 30 minutes after po intake    Other  Recommendations Oral Care Recommendations: Oral care BID   Follow up Recommendations 24 hour supervision/assistance      Frequency and Duration min 1 x/week  1 week        Prognosis Prognosis for Safe Diet Advancement: Guarded      Swallow Study   General Date of Onset: 12/26/17 HPI:  Andrew [name] is a 77 y.o. male with medical history significant of COPD, chronic respiratory failure, chronic diastolic congestive heart failure, dysphagia with recurrent aspiration, presents to the hospital with complaints of shortness of breath.  Patient reports over the past week he has been having progressive shortness of breath.  Daughter reports that he has been having increasing cough, wheezing shortness of breath.  She is also noticed worsening lower extremity edema.  The patient continues to smoke cigarettes.  He is been using his nebulizer at home without significant benefit.  He has not had any chest pain.  He is not had any fevers.  Daughter reports that he has had swallow evaluations in the past and has been told that he has dysphagia.  He was unable to tolerate thickened foods she notes that he often coughs after eating. Type of Study: Bedside Swallow Evaluation Previous Swallow Assessment: MBS 06/2017 Diet Prior to this Study: Dysphagia 3 (soft);Thin liquids Temperature Spikes Noted: No Respiratory Status: Nasal cannula History of Recent Intubation: No Behavior/Cognition: Alert;Cooperative Oral Cavity Assessment: Dry Oral Care Completed by SLP: Recent completion by staff Oral Cavity - Dentition: Adequate natural dentition Vision: Functional for self-feeding Self-Feeding Abilities: Able to feed self Patient Positioning: Upright in bed Baseline Vocal Quality: Normal Volitional Cough: Strong Volitional Swallow: Able to elicit    Oral/Motor/Sensory Function Overall Oral Motor/Sensory Function: Within functional limits   Ice Chips     Thin Liquid Thin Liquid: Within functional limits    Nectar Thick Nectar Thick Liquid: Not tested   Honey Thick     Puree Puree: Within functional limits   Solid   Amelia H. Roddie Mc, CCC-SLP Speech Language  Pathologist    Solid: Within functional limits        Wende Bushy 12/27/2017,4:42 PM

## 2017-12-27 NOTE — Progress Notes (Signed)
*  PRELIMINARY RESULTS* Echocardiogram 2D Echocardiogram has been performed.  Leavy Cella 12/27/2017, 10:37 AM

## 2017-12-28 LAB — GLUCOSE, CAPILLARY
GLUCOSE-CAPILLARY: 149 mg/dL — AB (ref 65–99)
GLUCOSE-CAPILLARY: 275 mg/dL — AB (ref 65–99)
Glucose-Capillary: 293 mg/dL — ABNORMAL HIGH (ref 65–99)
Glucose-Capillary: 281 mg/dL — ABNORMAL HIGH (ref 65–99)
Glucose-Capillary: 228 mg/dL — ABNORMAL HIGH (ref 65–99)

## 2017-12-28 LAB — BASIC METABOLIC PANEL WITH GFR
Anion gap: 12 (ref 5–15)
BUN: 29 mg/dL — ABNORMAL HIGH (ref 6–20)
CO2: 37 mmol/L — ABNORMAL HIGH (ref 22–32)
Calcium: 9 mg/dL (ref 8.9–10.3)
Chloride: 93 mmol/L — ABNORMAL LOW (ref 101–111)
Creatinine, Ser: 0.84 mg/dL (ref 0.61–1.24)
GFR calc Af Amer: >60 mL/min
GFR calc non Af Amer: >60 mL/min
Glucose, Bld: 270 mg/dL — ABNORMAL HIGH (ref 65–99)
Potassium: 3.6 mmol/L (ref 3.5–5.1)
Sodium: 142 mmol/L (ref 135–145)

## 2017-12-28 LAB — MAGNESIUM: Magnesium: 2 mg/dL (ref 1.7–2.4)

## 2017-12-28 MED ORDER — INSULIN GLARGINE 100 UNIT/ML ~~LOC~~ SOLN
40.0000 [IU] | Freq: Every day | SUBCUTANEOUS | Status: DC
Start: 2017-12-28 — End: 2017-12-29
  Administered 2017-12-28: 40 [IU] via SUBCUTANEOUS
  Filled 2017-12-28 (×4): qty 0.4

## 2017-12-28 MED ORDER — METHYLPREDNISOLONE SODIUM SUCC 125 MG IJ SOLR
60.0000 mg | Freq: Three times a day (TID) | INTRAMUSCULAR | Status: DC
  Administered 2017-12-28 – 2017-12-30 (×7): 60 mg via INTRAVENOUS
  Filled 2017-12-28 (×7): qty 2

## 2017-12-28 MED ORDER — INSULIN ASPART 100 UNIT/ML ~~LOC~~ SOLN
14.0000 [IU] | Freq: Three times a day (TID) | SUBCUTANEOUS | Status: DC
  Administered 2017-12-28 – 2017-12-29 (×3): 14 [IU] via SUBCUTANEOUS

## 2017-12-28 MED ORDER — FUROSEMIDE 10 MG/ML IJ SOLN
60.0000 mg | Freq: Every day | INTRAMUSCULAR | Status: DC
  Administered 2017-12-29: 60 mg via INTRAVENOUS
  Filled 2017-12-28: qty 6

## 2017-12-28 MED ORDER — INSULIN ASPART 100 UNIT/ML ~~LOC~~ SOLN
12.0000 [IU] | Freq: Three times a day (TID) | SUBCUTANEOUS | Status: DC
  Administered 2017-12-28: 12 [IU] via SUBCUTANEOUS

## 2017-12-28 NOTE — Progress Notes (Signed)
PROGRESS NOTE    Andrew [name]  ZJQ:[id number]  DOB: 08-03-1941  DOA: 12/26/2017 PCP: [facility]   Brief Admission Hx: Andrew [name] is a 77 y.o. male with medical history significant of COPD, chronic respiratory failure, chronic diastolic congestive heart failure, dysphagia with recurrent aspiration, presents to the hospital with complaints of shortness of breath .   MDM/Assessment & Plan:   1. Acute on chronic respiratory failure.  Multifactorial, related to COPD, probable aspiration as well as a component of CHF.  Will try and wean down oxygen as tolerated.  He is only slightly improved and needs ongoing acute inpatient treatment.  He continues to struggle and slow recovery, and will ask for a pulmonary consult for management recommendations.  2. Aspiration pneumonia.  Patient has a history of dysphagia.  SLP saw him and placed him on a regular texture diet, thin liquids.  Continue unasyn with pulmonary hygiene.  Will request speech therapy to consider repeat swallow evaluation for updated recommendations on diet.  He is been counseled on the importance of compliance with dietary restrictions.   3. COPD exacerbation.  Continues to have rhonchi and wheezing.  Will continue on bronchodilators and intravenous steroids.  Increase dose of IV steroids. 4. Acute on chronic diastolic congestive heart failure.  Has evidence of lower extremity edema.  Started on intravenous Lasix.  Will hold off on beta-blockers for now since he is actively short of breath and wheezing.  Started on low-dose losartan.  Update echocardiogram. 5. Atrial fibrillation.  History of paroxysmal atrial fibrillation.  Currently appears to be in sinus rhythm.  Continue on diltiazem for rate control.  He was on anticoagulation in the past, but at some point this was taken off of his list.  Review of records does not have any clear reason that anticoagulation was discontinued.  After discussing with the patient, he does  not report any history of falls or significant bleeding.  We will start him back on apixaban.  CHADSVASc score of at least 6 6. Diabetes, type 2 with hyperglycemia.  Uncontrolled.  Increased dose of Lantus and added prandial novolog.  Supplement with sliding scale.  Hold oral agents for now. 7. Hyperlipidemia.  Continue on statin 8. Hypertension.  Continue on diltiazem.  He is also started on losartan.  Follow blood pressure. 9. Tobacco use.  Counseled extensively on the importance of tobacco cessation.  We will continue nicotine patch.  DVT prophylaxis: Apixaban Code Status: Full code, confirmed with patient Family Communication: Discussed with family at the bedside Disposition Plan: Discharge home once improved, not medically ready.  Consults called:   Admission status: Inpatient, telemetry  Subjective: Pt says he has not had much improvement.   Objective: Vitals:   12/27/17 2332 12/28/17 0303 12/28/17 0329 12/28/17 0743  BP:   130/63   Pulse:   73   Resp:   19   Temp:   98.5 F (36.9 C)   TempSrc:   Oral   SpO2: 93% 94% 96% 96%  Weight:   77.9 kg (171 lb 11.8 oz)   Height:        Intake/Output Summary (Last 24 hours) at 12/28/2017 0832 Last data filed at 12/28/2017 0340 Gross per 24 hour  Intake 1766 ml  Output 3200 ml  Net -1434 ml   Filed Weights   12/26/17 1700 12/27/17 2020 12/28/17 0329  Weight: 76.4 kg (168 lb 6.4 oz) 78.4 kg (172 lb 13.5 oz) 77.9 kg (171 lb 11.8 oz)  REVIEW OF SYSTEMS  As per history otherwise all reviewed and reported negative  Exam:  General exam: awake, alert, NAD, cooperative.  Respiratory system: coarse rales, crackles heard.  Diffuse insp/exp wheezing heard.  No increased work of breathing. Cardiovascular system: S1 & S2 heard, RRR. No JVD, murmurs, gallops, clicks or pedal edema. Gastrointestinal system: Abdomen is nondistended, soft and nontender. Normal bowel sounds heard. Central nervous system: Alert and oriented. No focal  neurological deficits. Extremities: 2+ pitting pretibial edema bilateral lower extremities.  Data Reviewed: Basic Metabolic Panel: Recent Labs  Lab 12/26/17 1335 12/27/17 0433 12/28/17 0401  NA 144 143 142  K 4.1 4.1 3.6  CL 96* 94* 93*  CO2 35* 37* 37*  GLUCOSE 165* 211* 270*  BUN 13 20 29*  CREATININE 0.89 0.71 0.84  CALCIUM 9.2 9.1 9.0  MG  --   --  2.0   Liver Function Tests: Recent Labs  Lab 12/26/17 1335  AST 12*  ALT 16*  ALKPHOS 85  BILITOT 0.8  PROT 7.1  ALBUMIN 3.6   No results for input(s): LIPASE, AMYLASE in the last 168 hours. No results for input(s): AMMONIA in the last 168 hours. CBC: Recent Labs  Lab 12/26/17 1335  WBC 11.0*  NEUTROABS 9.8*  HGB 13.8  HCT 47.0  MCV 87.2  PLT 232   Cardiac Enzymes: Recent Labs  Lab 12/26/17 1335  TROPONINI <0.03   CBG (last 3)  Recent Labs    12/27/17 2129 12/28/17 0327 12/28/17 0723  GLUCAP 155* 293* 281*   No results found for this or any previous visit (from the past 240 hour(s)).   Studies: Dg Chest 2 View  Result Date: 12/26/2017 CLINICAL DATA:  Short of breath EXAM: CHEST - 2 VIEW COMPARISON:  11/19/2017, 10/06/2017, 06/29/2017, CT chest 06/28/2017 FINDINGS: Hyperinflation. Small left pleural effusion. Increased interstitial opacity compared to prior. Patchy more confluent opacity at the lingula and left base. Stable cardiomediastinal silhouette. No pneumothorax. IMPRESSION: 1. Hyperinflation. Diffuse increased interstitial opacity, suspect for acute interstitial inflammatory process on mild underlying chronic change. More confluent airspace disease at the lingula and left base, suspicious for pneumonia 2. Small left pleural effusion Electronically Signed   By: Donavan Foil M.D.   On: 12/26/2017 14:36    Scheduled Meds: . apixaban  5 mg Oral BID  . diltiazem  300 mg Oral Daily  . furosemide  60 mg Intravenous BID  . guaiFENesin  1,200 mg Oral BID  . insulin aspart  0-20 Units Subcutaneous  TID WC  . insulin aspart  0-5 Units Subcutaneous QHS  . insulin aspart  12 Units Subcutaneous TID WC  . insulin glargine  34 Units Subcutaneous QHS  . ipratropium-albuterol  3 mL Nebulization Q4H  . losartan  25 mg Oral Daily  . mouth rinse  15 mL Mouth Rinse BID  . methylPREDNISolone (SOLU-MEDROL) injection  60 mg Intravenous TID  . mometasone-formoterol  2 puff Inhalation BID  . nicotine  21 mg Transdermal Daily  . pantoprazole  40 mg Oral Daily  . rosuvastatin  20 mg Oral q1800  . sodium chloride flush  3 mL Intravenous Q12H   Continuous Infusions: . sodium chloride    . ampicillin-sulbactam (UNASYN) IV 3 g (12/28/17 0748)    Active Problems:   Diabetes mellitus ([place])   HLD (hyperlipidemia)   TOBACCO ABUSE   ATRIAL FIBRILLATION   CHRONIC OBSTRUCTIVE PULMONARY DISEASE, ACUTE EXACERBATION   Acute on chronic respiratory failure with hypoxia (HCC)   Acute  respiratory failure (HCC)   Aspiration pneumonia (HCC)   Dysphagia   Acute on chronic diastolic CHF (congestive heart failure) ([place])  Time spent:   Irwin Brakeman, MD, FAAFP [facility] Pager [phone]  If 7PM-7AM, please contact night-coverage [email or web address] Password [facility] 12/28/2017, 8:32 AM    LOS: 2 days

## 2017-12-28 NOTE — Consult Note (Signed)
Consult requested by: [facility], Dr. Wynetta Emery Consult requested for: COPD exacerbation acute on chronic hypoxic respiratory failure  HPI: This is a 77 year old with a long known history of COPD.  He came to the emergency department because of increasing shortness of breath.  He is had cough and congestion as well.  He has had some swelling of his legs.  He is known to have chronic hypoxic respiratory failure, episodes of recurrent aspiration, chronic diastolic heart failure, chronic problems with his back, he receives most of his care at the [place].  He is limited at home by both his shortness of breath and by his leg problems.  It is reported that he coughs frequently after he eats and he does not tolerate thickened foods.  Chest x-ray that I have personally reviewed shows probable pneumonia in the left base I think most likely from aspiration.  He denies chest pain hemoptysis nausea vomiting diarrhea headache abdominal pain or urinary symptoms  Past Medical History:  Diagnosis Date  . A-fib ([place])   . Abdominal pain   . Adenomatous colon polyp   . Arthritis   . Asthma   . CAD (coronary artery disease)   . Cancer ([place])    skin, nose  . Cataract    bilateral  . CHF (congestive heart failure) ([place])    patient reports  . Chronic LBP   . COPD (chronic obstructive pulmonary disease) ([place])   . Decreased libido   . Diverticulosis of colon   . Esophageal stricture   . Fatigue   . GERD (gastroesophageal reflux disease)   . History of small bowel obstruction   . Hyperlipidemia   . Hypertension   . Hypertrophy of prostate with urinary obstruction and other lower urinary tract symptoms (LUTS)   . Palpitations   . Peri-rectal abscess   . Personal history of renal calculi   . Prostatitis, acute   . Stroke [facility])    pt reports  . Tobacco abuse      Family History  Problem Relation Age of Onset  . Heart disease Brother        CAD  . Diabetes Brother   . COPD Brother   . Emphysema  Brother      Social History   Socioeconomic History  . Marital status: Divorced    Spouse name: Not on file  . Number of children: Not on file  . Years of education: Not on file  . Highest education level: Not on file  Occupational History  . Occupation: Retired    Fish farm manager: RETIRED  Social Needs  . Financial resource strain: Not on file  . Food insecurity:    Worry: Not on file    Inability: Not on file  . Transportation needs:    Medical: Not on file    Non-medical: Not on file  Tobacco Use  . Smoking status: Current Every Day Smoker    Packs/day: 1.00    Types: Cigarettes  . Smokeless tobacco: Never Used  Substance and Sexual Activity  . Alcohol use: No  . Drug use: No  . Sexual activity: Not Currently  Lifestyle  . Physical activity:    Days per week: Not on file    Minutes per session: Not on file  . Stress: Not on file  Relationships  . Social connections:    Talks on phone: Not on file    Gets together: Not on file    Attends religious service: Not on file  Active member of club or organization: Not on file    Attends meetings of clubs or organizations: Not on file    Relationship status: Not on file  Other Topics Concern  . Not on file  Social History Narrative  . Not on file     ROS: Except as mentioned 10 point review of systems is negative    Objective: Vital signs in last 24 hours: Temp:  [98.5 F (36.9 C)] 98.5 F (36.9 C) (04/25 0329) Pulse Rate:  [47-79] 73 (04/25 0329) Resp:  [19-22] 19 (04/25 0329) BP: (112-147)/(63-74) 130/63 (04/25 0329) SpO2:  [92 %-97 %] 96 % (04/25 0743) Weight:  [77.9 kg (171 lb 11.8 oz)-78.4 kg (172 lb 13.5 oz)] 77.9 kg (171 lb 11.8 oz) (04/25 0329) Weight change: 0.835 kg (1 lb 13.5 oz) Last BM Date: 12/24/17  Intake/Output from previous day: 04/24 0701 - 04/25 0700 In: 8299 [P.O.:1560; I.V.:6; IV Piggyback:200] Out: 3200 [Urine:3200]  PHYSICAL EXAM Constitutional: He is awake and alert.  He looks  short of breath with simply conversation.  Eyes: Pupils react EOMI.  Ears nose mouth and throat: His mucous membranes are moist.  Throat is clear.  Hearing is grossly normal.  Cardiovascular: His heart is regular with no gallop.  Respiratory: He has increased respiratory effort and has fairly marked bilateral rhonchi and wheezing.  Gastrointestinal: His abdomen is soft with no masses.  Skin: Warm and dry.  Musculoskeletal: Upper extremity strength is normal and he seems to have some decreased strength in his lower extremities bilaterally.  Neurological: No focal abnormalities.  Psychiatric: Normal mood and affect  Lab Results: Basic Metabolic Panel: Recent Labs    12/27/17 0433 12/28/17 0401  NA 143 142  K 4.1 3.6  CL 94* 93*  CO2 37* 37*  GLUCOSE 211* 270*  BUN 20 29*  CREATININE 0.71 0.84  CALCIUM 9.1 9.0  MG  --  2.0   Liver Function Tests: Recent Labs    12/26/17 1335  AST 12*  ALT 16*  ALKPHOS 85  BILITOT 0.8  PROT 7.1  ALBUMIN 3.6   No results for input(s): LIPASE, AMYLASE in the last 72 hours. No results for input(s): AMMONIA in the last 72 hours. CBC: Recent Labs    12/26/17 1335  WBC 11.0*  NEUTROABS 9.8*  HGB 13.8  HCT 47.0  MCV 87.2  PLT 232   Cardiac Enzymes: Recent Labs    12/26/17 1335  TROPONINI <0.03   BNP: No results for input(s): PROBNP in the last 72 hours. D-Dimer: No results for input(s): DDIMER in the last 72 hours. CBG: Recent Labs    12/27/17 0812 12/27/17 1153 12/27/17 1625 12/27/17 2129 12/28/17 0327 12/28/17 0723  GLUCAP 243* 378* 223* 155* 293* 281*   Hemoglobin A1C: No results for input(s): HGBA1C in the last 72 hours. Fasting Lipid Panel: No results for input(s): CHOL, HDL, LDLCALC, TRIG, CHOLHDL, LDLDIRECT in the last 72 hours. Thyroid Function Tests: No results for input(s): TSH, T4TOTAL, FREET4, T3FREE, THYROIDAB in the last 72 hours. Anemia Panel: No results for input(s): VITAMINB12, FOLATE, FERRITIN, TIBC, IRON,  RETICCTPCT in the last 72 hours. Coagulation: No results for input(s): LABPROT, INR in the last 72 hours. Urine Drug Screen: Drugs of Abuse  No results found for: LABOPIA, COCAINSCRNUR, LABBENZ, AMPHETMU, THCU, LABBARB  Alcohol Level: No results for input(s): ETH in the last 72 hours. Urinalysis: No results for input(s): COLORURINE, LABSPEC, PHURINE, GLUCOSEU, HGBUR, BILIRUBINUR, KETONESUR, PROTEINUR, UROBILINOGEN, NITRITE, LEUKOCYTESUR in the last  72 hours.  Invalid input(s): APPERANCEUR Misc. Labs:   ABGS: No results for input(s): PHART, PO2ART, TCO2, HCO3 in the last 72 hours.  Invalid input(s): PCO2   MICROBIOLOGY: No results found for this or any previous visit (from the past 240 hour(s)).  Studies/Results: Dg Chest 2 View  Result Date: 12/26/2017 CLINICAL DATA:  Short of breath EXAM: CHEST - 2 VIEW COMPARISON:  11/19/2017, 10/06/2017, 06/29/2017, CT chest 06/28/2017 FINDINGS: Hyperinflation. Small left pleural effusion. Increased interstitial opacity compared to prior. Patchy more confluent opacity at the lingula and left base. Stable cardiomediastinal silhouette. No pneumothorax. IMPRESSION: 1. Hyperinflation. Diffuse increased interstitial opacity, suspect for acute interstitial inflammatory process on mild underlying chronic change. More confluent airspace disease at the lingula and left base, suspicious for pneumonia 2. Small left pleural effusion Electronically Signed   By: Donavan Foil M.D.   On: 12/26/2017 14:36    Medications:  Prior to Admission:  Medications Prior to Admission  Medication Sig Dispense Refill Last Dose  . acetaminophen (TYLENOL) 650 MG CR tablet Take 650 mg by mouth every 8 (eight) hours as needed for pain.   12/27/2017 at Unknown time  . albuterol (PROVENTIL) (2.5 MG/3ML) 0.083% nebulizer solution Take 3 mLs (2.5 mg total) by nebulization every 4 (four) hours as needed for wheezing or shortness of breath. 75 mL 12   . ALPRAZolam (XANAX) 0.25 MG  tablet Take 1 tablet (0.25 mg total) by mouth 3 (three) times daily as needed for anxiety. 10 tablet 0 12/27/2017 at Unknown time  . aspirin 325 MG EC tablet Take 325 mg by mouth daily.   12/27/2017 at Unknown time  . cyclobenzaprine (FLEXERIL) 10 MG tablet Take 5 mg by mouth at bedtime.   12/27/2017 at Unknown time  . diltiazem (TIAZAC) 300 MG 24 hr capsule Take 1 capsule (300 mg total) by mouth daily. 30 capsule 0 12/27/2017 at Unknown time  . diphenhydrAMINE (BENADRYL) 25 MG tablet Take 1 tablet by mouth at bedtime.   12/26/2017 at Unknown time  . Fluticasone-Salmeterol (ADVAIR DISKUS) 500-50 MCG/DOSE AEPB Inhale 1 puff into the lungs 2 (two) times daily. 60 each 0 12/26/2017 at Unknown time  . furosemide (LASIX) 40 MG tablet Take 1 tablet (40 mg total) by mouth daily. 30 tablet 0 12/27/2017 at Unknown time  . glipiZIDE (GLUCOTROL XL) 5 MG 24 hr tablet Take 2.5 mg by mouth daily.    12/27/2017 at Unknown time  . ibuprofen (ADVIL,MOTRIN) 200 MG tablet Take 1 tablet (200 mg total) by mouth every 8 (eight) hours as needed (back pain). 30 tablet 0 11/17/2017 at Unknown time  . insulin aspart (NOVOLOG) 100 UNIT/ML FlexPen Check CBG's there etimes daily before meals. For CBG less than 150: 0 Units For CBG 150-200: 2 Units SQ For CBG 200-250: 4 Units SQ For CBG 250-300: 6 Units SQ For CBG 300-350: 8 Units SQ and call PCP for further instructions 15 mL 0 12/26/2017 at Unknown time  . insulin glargine (LANTUS) 100 unit/mL SOPN Inject 0.25 mLs (25 Units total) into the skin daily. (Patient taking differently: Inject 10 Units into the skin daily. ) 7.5 mL 0 12/26/2017 at Unknown time  . Melatonin 3 MG TABS Take 1 tablet by mouth at bedtime.   12/26/2017 at Unknown time  . metoCLOPramide (REGLAN) 10 MG tablet Take 10 mg by mouth every 6 (six) hours as needed for nausea.   12/27/2017 at Unknown time  . montelukast (SINGULAIR) 10 MG tablet Take 10 mg by mouth at  bedtime.   12/26/2017 at Unknown time  . omeprazole  (PRILOSEC) 40 MG capsule Take 40 mg by mouth daily.   12/27/2017 at Unknown time  . predniSONE (DELTASONE) 10 MG tablet Take 10 mg by mouth daily with breakfast.   12/27/2017 at Unknown time  . rosuvastatin (CRESTOR) 20 MG tablet Take 1 tablet (20 mg total) by mouth daily. 30 tablet 0 12/27/2017 at Unknown time  . tiotropium (SPIRIVA HANDIHALER) 18 MCG inhalation capsule Place 1 capsule (18 mcg total) into inhaler and inhale daily. 30 capsule 2 12/26/2017 at Unknown time  . tiZANidine (ZANAFLEX) 4 MG tablet Take 4 mg by mouth 3 (three) times daily.   12/27/2017 at Unknown time  . vitamin B-12 (CYANOCOBALAMIN) 500 MCG tablet Take 500 mcg by mouth 2 (two) times daily.   12/27/2017 at Unknown time  . nicotine (NICODERM CQ - DOSED IN MG/24 HOURS) 21 mg/24hr patch Place 1 patch (21 mg total) onto the skin daily. (Patient not taking: Reported on 11/17/2017) 28 patch 0 Not Taking at Unknown time   Scheduled: . apixaban  5 mg Oral BID  . diltiazem  300 mg Oral Daily  . [START ON 12/29/2017] furosemide  60 mg Intravenous Daily  . guaiFENesin  1,200 mg Oral BID  . insulin aspart  0-20 Units Subcutaneous TID WC  . insulin aspart  0-5 Units Subcutaneous QHS  . insulin aspart  12 Units Subcutaneous TID WC  . insulin glargine  34 Units Subcutaneous QHS  . ipratropium-albuterol  3 mL Nebulization Q4H  . losartan  25 mg Oral Daily  . mouth rinse  15 mL Mouth Rinse BID  . methylPREDNISolone (SOLU-MEDROL) injection  60 mg Intravenous TID  . mometasone-formoterol  2 puff Inhalation BID  . nicotine  21 mg Transdermal Daily  . pantoprazole  40 mg Oral Daily  . rosuvastatin  20 mg Oral q1800  . sodium chloride flush  3 mL Intravenous Q12H   Continuous: . sodium chloride    . ampicillin-sulbactam (UNASYN) IV 3 g (12/28/17 0748)   YDX:AJOINO chloride, acetaminophen, albuterol, ALPRAZolam, ondansetron (ZOFRAN) IV, sodium chloride flush  Assesment: He has severe COPD with exacerbation.  This is being appropriately  treated  He has acute on chronic hypoxic respiratory failure on oxygen  He has aspiration pneumonia on appropriate treatment  He is still smoking and needs to stop but I am not sure he will do that  He has acute on chronic diastolic heart failure which is being treated Active Problems:   Diabetes mellitus (HCC)   HLD (hyperlipidemia)   TOBACCO ABUSE   ATRIAL FIBRILLATION   CHRONIC OBSTRUCTIVE PULMONARY DISEASE, ACUTE EXACERBATION   Acute on chronic respiratory failure with hypoxia (HCC)   Acute respiratory failure (HCC)   Aspiration pneumonia (HCC)   Dysphagia   Acute on chronic diastolic CHF (congestive heart failure) ([place])    Plan: He is on appropriate treatment.  He does however have severe disease.  He says he is not coughing anything up so I will add a flutter valve to see if that helps.  Continue IV steroids and IV antibiotics.  Thanks for allowing me to see him with you    LOS: 2 days   Reilynn Lauro L 12/28/2017, 9:03 AM

## 2017-12-29 ENCOUNTER — Encounter (HOSPITAL_COMMUNITY): Payer: Self-pay | Admitting: Primary Care

## 2017-12-29 LAB — BASIC METABOLIC PANEL
Anion gap: 14 (ref 5–15)
BUN: 28 mg/dL — ABNORMAL HIGH (ref 6–20)
CALCIUM: 9.2 mg/dL (ref 8.9–10.3)
CO2: 37 mmol/L — ABNORMAL HIGH (ref 22–32)
Chloride: 90 mmol/L — ABNORMAL LOW (ref 101–111)
Creatinine, Ser: 0.71 mg/dL (ref 0.61–1.24)
GFR calc Af Amer: >60 mL/min (ref >60)
Glucose, Bld: 201 mg/dL — ABNORMAL HIGH (ref 65–99)
POTASSIUM: 3.7 mmol/L (ref 3.5–5.1)
SODIUM: 141 mmol/L (ref 135–145)

## 2017-12-29 LAB — GLUCOSE, CAPILLARY
GLUCOSE-CAPILLARY: 194 mg/dL — AB (ref 65–99)
GLUCOSE-CAPILLARY: 126 mg/dL — AB (ref 65–99)
Glucose-Capillary: 339 mg/dL — ABNORMAL HIGH (ref 65–99)
Glucose-Capillary: 275 mg/dL — ABNORMAL HIGH (ref 65–99)
Glucose-Capillary: 166 mg/dL — ABNORMAL HIGH (ref 65–99)

## 2017-12-29 MED ORDER — INSULIN GLARGINE 100 UNIT/ML ~~LOC~~ SOLN
50.0000 [IU] | Freq: Every day | SUBCUTANEOUS | Status: DC
  Administered 2017-12-29: 50 [IU] via SUBCUTANEOUS
  Filled 2017-12-29 (×3): qty 0.5

## 2017-12-29 MED ORDER — FUROSEMIDE 10 MG/ML IJ SOLN
80.0000 mg | Freq: Two times a day (BID) | INTRAMUSCULAR | Status: DC
Start: 2017-12-29 — End: 2017-12-30
  Administered 2017-12-29 – 2017-12-30 (×2): 80 mg via INTRAVENOUS
  Filled 2017-12-29 (×2): qty 8

## 2017-12-29 MED ORDER — INSULIN ASPART 100 UNIT/ML ~~LOC~~ SOLN
18.0000 [IU] | Freq: Three times a day (TID) | SUBCUTANEOUS | Status: DC
  Administered 2017-12-29 – 2017-12-30 (×4): 18 [IU] via SUBCUTANEOUS

## 2017-12-29 MED ORDER — ACETYLCYSTEINE 20 % IN SOLN
3.0000 mL | RESPIRATORY_TRACT | Status: DC
  Administered 2017-12-29 – 2017-12-30 (×7): 3 mL via RESPIRATORY_TRACT
  Filled 2017-12-29 (×7): qty 4

## 2017-12-29 NOTE — Progress Notes (Signed)
Palliative: Mr. Ress is resting quietly in bed.  He greets me making and keeping eye contact.  He is calm and cooperative, joking.  There is no family at bedside at this time.  Due to advanced age of 75,, and no family support present, no detailed palliative discussion held today.  I share that I will follow-up with him on Monday.  He tells me that he expects he will be leaving this weekend. No charge Quinn Axe, NP Palliative Medicine Team Team Phone # [phone] (Nights/Weekends)

## 2017-12-29 NOTE — Progress Notes (Addendum)
SATURATION QUALIFICATIONS: (This note is used to comply with regulatory documentation for home oxygen)  Patient Saturations on Room Air at Rest = 86%  Patient Saturations on Room Air while Ambulating  n/a  Patient Saturations on 3Liters of oxygen while Ambulating = 93  Please briefly explain why patient needs home oxygen:

## 2017-12-29 NOTE — Progress Notes (Signed)
Subjective: He says he does not feel any different.  He is still coughing and still congested.  He is not able to cough anything up.  Objective: Vital signs in last 24 hours: Temp:  [97.5 F (36.4 C)-98.4 F (36.9 C)] 97.5 F (36.4 C) (04/26 0550) Pulse Rate:  [64-66] 66 (04/26 0550) Resp:  [18-22] 18 (04/25 2108) BP: (125-126)/(54-72) 125/64 (04/26 0550) SpO2:  [92 %-99 %] 99 % (04/26 0550) Weight:  [79.9 kg (176 lb 2.4 oz)-80.5 kg (177 lb 7.5 oz)] 79.9 kg (176 lb 2.4 oz) (04/26 0550) Weight change: 2.1 kg (4 lb 10.1 oz) Last BM Date: 12/24/17  Intake/Output from previous day: 04/25 0701 - 04/26 0700 In: 240 [P.O.:240] Out: 875 [Urine:875]  PHYSICAL EXAM General appearance: alert, cooperative and mild distress Resp: rhonchi bilaterally Cardio: regular rate and rhythm, S1, S2 normal, no murmur, click, rub or gallop GI: soft, non-tender; bowel sounds normal; no masses,  no organomegaly Extremities: Still has some edema of his legs  Lab Results:  Results for orders placed or performed during the hospital encounter of 12/26/17 (from the past 48 hour(s))  Glucose, capillary     Status: Abnormal   Collection Time: 12/27/17 11:53 AM  Result Value Ref Range   Glucose-Capillary 378 (H) 65 - 99 mg/dL   Comment 1 Notify RN    Comment 2 Document in Chart   Glucose, capillary     Status: Abnormal   Collection Time: 12/27/17  4:25 PM  Result Value Ref Range   Glucose-Capillary 223 (H) 65 - 99 mg/dL   Comment 1 Notify RN    Comment 2 Document in Chart   Glucose, capillary     Status: Abnormal   Collection Time: 12/27/17  9:29 PM  Result Value Ref Range   Glucose-Capillary 155 (H) 65 - 99 mg/dL  Glucose, capillary     Status: Abnormal   Collection Time: 12/28/17  3:27 AM  Result Value Ref Range   Glucose-Capillary 293 (H) 65 - 99 mg/dL  Basic metabolic panel     Status: Abnormal   Collection Time: 12/28/17  4:01 AM  Result Value Ref Range   Sodium 142 135 - 145 mmol/L   Potassium 3.6 3.5 - 5.1 mmol/L   Chloride 93 (L) 101 - 111 mmol/L   CO2 37 (H) 22 - 32 mmol/L   Glucose, Bld 270 (H) 65 - 99 mg/dL   BUN 29 (H) 6 - 20 mg/dL   Creatinine, Ser 0.84 0.61 - 1.24 mg/dL   Calcium 9.0 8.9 - 10.3 mg/dL   GFR calc non Af Amer >60 >60 mL/min   GFR calc Af Amer >60 >60 mL/min    Comment: (NOTE) The eGFR has been calculated using the CKD EPI equation. This calculation has not been validated in all clinical situations. eGFR's persistently <60 mL/min signify possible Chronic Kidney Disease.    Anion gap 12 5 - 15    Comment: Performed at [facility], [address] [place], [place] [address]  Magnesium     Status: None   Collection Time: 12/28/17  4:01 AM  Result Value Ref Range   Magnesium 2.0 1.7 - 2.4 mg/dL    Comment: Performed at [facility], [address] [place], [place] [address]  Glucose, capillary     Status: Abnormal   Collection Time: 12/28/17  7:23 AM  Result Value Ref Range   Glucose-Capillary 281 (H) 65 - 99 mg/dL   Comment 1 Notify RN    Comment  2 Document in Chart   Glucose, capillary     Status: Abnormal   Collection Time: 12/28/17 11:30 AM  Result Value Ref Range   Glucose-Capillary 228 (H) 65 - 99 mg/dL   Comment 1 Notify RN    Comment 2 Document in Chart   Glucose, capillary     Status: Abnormal   Collection Time: 12/28/17  4:12 PM  Result Value Ref Range   Glucose-Capillary 149 (H) 65 - 99 mg/dL   Comment 1 Notify RN    Comment 2 Document in Chart   Glucose, capillary     Status: Abnormal   Collection Time: 12/28/17  9:16 PM  Result Value Ref Range   Glucose-Capillary 275 (H) 65 - 99 mg/dL  Glucose, capillary     Status: Abnormal   Collection Time: 12/29/17  2:51 AM  Result Value Ref Range   Glucose-Capillary 194 (H) 65 - 99 mg/dL  Basic metabolic panel     Status: Abnormal   Collection Time: 12/29/17  6:35 AM  Result Value Ref Range   Sodium 141 135 - 145 mmol/L   Potassium 3.7 3.5 - 5.1 mmol/L   Chloride 90 (L) 101 -  111 mmol/L   CO2 37 (H) 22 - 32 mmol/L   Glucose, Bld 201 (H) 65 - 99 mg/dL   BUN 28 (H) 6 - 20 mg/dL   Creatinine, Ser 0.71 0.61 - 1.24 mg/dL   Calcium 9.2 8.9 - 10.3 mg/dL   GFR calc non Af Amer >60 >60 mL/min   GFR calc Af Amer >60 >60 mL/min    Comment: (NOTE) The eGFR has been calculated using the CKD EPI equation. This calculation has not been validated in all clinical situations. eGFR's persistently <60 mL/min signify possible Chronic Kidney Disease.    Anion gap 14 5 - 15    Comment: Performed at [facility], [address] [place], [place] [address]  Glucose, capillary     Status: Abnormal   Collection Time: 12/29/17  8:01 AM  Result Value Ref Range   Glucose-Capillary 339 (H) 65 - 99 mg/dL    ABGS No results for input(s): PHART, PO2ART, TCO2, HCO3 in the last 72 hours.  Invalid input(s): PCO2 CULTURES No results found for this or any previous visit (from the past 240 hour(s)). Studies/Results: No results found.  Medications:  Prior to Admission:  Medications Prior to Admission  Medication Sig Dispense Refill Last Dose  . acetaminophen (TYLENOL) 650 MG CR tablet Take 650 mg by mouth every 8 (eight) hours as needed for pain.   12/27/2017 at Unknown time  . albuterol (PROVENTIL) (2.5 MG/3ML) 0.083% nebulizer solution Take 3 mLs (2.5 mg total) by nebulization every 4 (four) hours as needed for wheezing or shortness of breath. 75 mL 12   . ALPRAZolam (XANAX) 0.25 MG tablet Take 1 tablet (0.25 mg total) by mouth 3 (three) times daily as needed for anxiety. 10 tablet 0 12/27/2017 at Unknown time  . aspirin 325 MG EC tablet Take 325 mg by mouth daily.   12/27/2017 at Unknown time  . cyclobenzaprine (FLEXERIL) 10 MG tablet Take 5 mg by mouth at bedtime.   12/27/2017 at Unknown time  . diltiazem (TIAZAC) 300 MG 24 hr capsule Take 1 capsule (300 mg total) by mouth daily. 30 capsule 0 12/27/2017 at Unknown time  . diphenhydrAMINE (BENADRYL) 25 MG tablet Take 1 tablet by mouth at  bedtime.   12/26/2017 at Unknown time  . Fluticasone-Salmeterol (ADVAIR DISKUS) 500-50 MCG/DOSE  AEPB Inhale 1 puff into the lungs 2 (two) times daily. 60 each 0 12/26/2017 at Unknown time  . furosemide (LASIX) 40 MG tablet Take 1 tablet (40 mg total) by mouth daily. 30 tablet 0 12/27/2017 at Unknown time  . glipiZIDE (GLUCOTROL XL) 5 MG 24 hr tablet Take 2.5 mg by mouth daily.    12/27/2017 at Unknown time  . ibuprofen (ADVIL,MOTRIN) 200 MG tablet Take 1 tablet (200 mg total) by mouth every 8 (eight) hours as needed (back pain). 30 tablet 0 11/17/2017 at Unknown time  . insulin aspart (NOVOLOG) 100 UNIT/ML FlexPen Check CBG's there etimes daily before meals. For CBG less than 150: 0 Units For CBG 150-200: 2 Units SQ For CBG 200-250: 4 Units SQ For CBG 250-300: 6 Units SQ For CBG 300-350: 8 Units SQ and call PCP for further instructions 15 mL 0 12/26/2017 at Unknown time  . insulin glargine (LANTUS) 100 unit/mL SOPN Inject 0.25 mLs (25 Units total) into the skin daily. (Patient taking differently: Inject 10 Units into the skin daily. ) 7.5 mL 0 12/26/2017 at Unknown time  . Melatonin 3 MG TABS Take 1 tablet by mouth at bedtime.   12/26/2017 at Unknown time  . metoCLOPramide (REGLAN) 10 MG tablet Take 10 mg by mouth every 6 (six) hours as needed for nausea.   12/27/2017 at Unknown time  . montelukast (SINGULAIR) 10 MG tablet Take 10 mg by mouth at bedtime.   12/26/2017 at Unknown time  . omeprazole (PRILOSEC) 40 MG capsule Take 40 mg by mouth daily.   12/27/2017 at Unknown time  . predniSONE (DELTASONE) 10 MG tablet Take 10 mg by mouth daily with breakfast.   12/27/2017 at Unknown time  . rosuvastatin (CRESTOR) 20 MG tablet Take 1 tablet (20 mg total) by mouth daily. 30 tablet 0 12/27/2017 at Unknown time  . tiotropium (SPIRIVA HANDIHALER) 18 MCG inhalation capsule Place 1 capsule (18 mcg total) into inhaler and inhale daily. 30 capsule 2 12/26/2017 at Unknown time  . tiZANidine (ZANAFLEX) 4 MG tablet Take 4 mg  by mouth 3 (three) times daily.   12/27/2017 at Unknown time  . vitamin B-12 (CYANOCOBALAMIN) 500 MCG tablet Take 500 mcg by mouth 2 (two) times daily.   12/27/2017 at Unknown time  . nicotine (NICODERM CQ - DOSED IN MG/24 HOURS) 21 mg/24hr patch Place 1 patch (21 mg total) onto the skin daily. (Patient not taking: Reported on 11/17/2017) 28 patch 0 Not Taking at Unknown time   Scheduled: . acetylcysteine  3 mL Nebulization Q4H  . apixaban  5 mg Oral BID  . diltiazem  300 mg Oral Daily  . furosemide  60 mg Intravenous Daily  . guaiFENesin  1,200 mg Oral BID  . insulin aspart  0-20 Units Subcutaneous TID WC  . insulin aspart  0-5 Units Subcutaneous QHS  . insulin aspart  14 Units Subcutaneous TID WC  . insulin glargine  40 Units Subcutaneous QHS  . ipratropium-albuterol  3 mL Nebulization Q4H  . losartan  25 mg Oral Daily  . mouth rinse  15 mL Mouth Rinse BID  . methylPREDNISolone (SOLU-MEDROL) injection  60 mg Intravenous TID  . mometasone-formoterol  2 puff Inhalation BID  . nicotine  21 mg Transdermal Daily  . pantoprazole  40 mg Oral Daily  . rosuvastatin  20 mg Oral q1800  . sodium chloride flush  3 mL Intravenous Q12H   Continuous: . sodium chloride    . ampicillin-sulbactam (UNASYN) IV 3 g (  12/29/17 0846)   LAG:TXMIWO chloride, acetaminophen, albuterol, ALPRAZolam, ondansetron (ZOFRAN) IV, sodium chloride flush  Assesment: He has acute on chronic hypoxic respiratory failure with what I believe is aspiration pneumonia and COPD exacerbation.  He is still very short of breath even with conversation and is coughing nonproductively  He has acute on chronic diastolic heart failure on IV Lasix.  He has dysphagia and apparently has not been compliant with his diet Active Problems:   Diabetes mellitus (HCC)   HLD (hyperlipidemia)   TOBACCO ABUSE   ATRIAL FIBRILLATION   CHRONIC OBSTRUCTIVE PULMONARY DISEASE, ACUTE EXACERBATION   Acute on chronic respiratory failure with hypoxia  (HCC)   Acute respiratory failure (HCC)   Aspiration pneumonia (HCC)   Dysphagia   Acute on chronic diastolic CHF (congestive heart failure) ([place])    Plan: Continue treatments.  Add Mucomyst and see if it will help clear some secretions.    LOS: 3 days   Theophil Thivierge L 12/29/2017, 8:46 AM

## 2017-12-29 NOTE — Care Management Note (Signed)
Case Management Note  Patient Details  Name: Andrew [name] MRN: [id number] Date of Birth: 1941-05-05  Subjective/Objective:     Admitted with resp failure. Pt from home, ind pta. He is pt of [facility]. Pt says pt says he is supposed to getting home oxygen delivered on Monday. CM contacted pt's SW Drue Stager ext# [phone]. Per SW referral for home oxygen has not been completed. They need more info.                Action/Plan: CM has faxed O2 assessment and order to fax # [phone]. Lajuana Matte will let this CM know if pt can have oxygen set up for DC tomorrow, if not pt may need to have O2 set up through [facility] temporarily while [place] completes there process.   Expected Discharge Date:    12/30/2017              Expected Discharge Plan:  Home/Self Care  In-House Referral:  NA  Discharge planning Services  CM Consult  Post Acute Care Choice:  Durable Medical Equipment Choice offered to:  NA  DME Arranged:  Oxygen DME Agency:     Status of Service:  In process, will continue to follow  Sherald Barge, RN 12/29/2017, 3:29 PM

## 2017-12-29 NOTE — Progress Notes (Addendum)
PROGRESS NOTE  Andrew [name]  XVQ:[id number]  DOB: [birth year]  DOA: 12/26/2017 PCP: [facility]   Brief Admission Hx: Andrew [name] is a 77 y.o. male with medical history significant of COPD, chronic respiratory failure, chronic diastolic congestive heart failure, dysphagia with recurrent aspiration, presents to the hospital with complaints of shortness of breath .   MDM/Assessment & Plan:   1. Acute on chronic respiratory failure.  Multifactorial, related to COPD, probable aspiration as well as a component of CHF.  Will try and wean down oxygen as tolerated.  He is only slightly improved and needs ongoing acute inpatient treatment.  He continues to struggle and slow recovery, and will ask for a pulmonary consult for management recommendations.  Continue current treatment for now, pulmonary added mucomyst to help with secretions.  2. Aspiration pneumonia.  Patient has a history of dysphagia.  SLP saw him and placed him on a regular texture diet, thin liquids.  Continue unasyn with pulmonary hygiene.  Will request speech therapy to consider repeat swallow evaluation for updated recommendations on diet.  He is been counseled on the importance of compliance with dietary restrictions.   3. COPD exacerbation.  Continues to have rhonchi and wheezing.  Will continue on bronchodilators and intravenous steroids.  Increase dose of IV steroids. 4. Acute on chronic diastolic congestive heart failure.  Has evidence of lower extremity edema.  Started on intravenous Lasix.  Will hold off on beta-blockers for now since he is actively short of breath and wheezing.  Started on low-dose losartan.  Updated echocardiogram: mild LVH, EF 60-65%, indeterminate DD.   5. Atrial fibrillation.  History of paroxysmal atrial fibrillation.  Currently appears to be in sinus rhythm.  Continue on diltiazem for rate control.  He was on anticoagulation in the past, but at some point this was taken off of his list.  Review of  records does not have any clear reason that anticoagulation was discontinued.  After discussing with the patient, he does not report any history of falls or significant bleeding.  We started him back on apixaban.  CHADSVASc score of at least 6.    6. Diabetes, type 2 with hyperglycemia.  Uncontrolled.  Increased dose of Lantus and added prandial novolog.  Supplement with sliding scale.  Hold oral agents for now. 7. Hyperlipidemia.  Continue on statin 8. Hypertension.  Continue on diltiazem.  He is also started on losartan.  Follow blood pressure. 9. Tobacco use.  Counseled extensively on the importance of tobacco cessation.  We will continue nicotine patch.  DVT prophylaxis: Apixaban Code Status: Full code, confirmed with patient Family Communication: Discussed with family at the bedside Disposition Plan: Discharge home once improved, not medically ready.  Consults called:   Admission status: Inpatient, telemetry  Subjective: Pt still having a lot of SOB but some improvement noted.   Objective: Vitals:   12/28/17 2345 12/29/17 0400 12/29/17 0550 12/29/17 0846  BP:   125/64   Pulse:   66   Resp:      Temp:   (!) 97.5 F (36.4 C)   TempSrc:   Oral   SpO2: 92%  99% 95%  Weight:  80.5 kg (177 lb 7.5 oz) 79.9 kg (176 lb 2.4 oz)   Height:        Intake/Output Summary (Last 24 hours) at 12/29/2017 1151 Last data filed at 12/29/2017 0900 Gross per 24 hour  Intake 600 ml  Output 875 ml  Net -275 ml   Filed  Weights   12/28/17 0329 12/29/17 0400 12/29/17 0550  Weight: 77.9 kg (171 lb 11.8 oz) 80.5 kg (177 lb 7.5 oz) 79.9 kg (176 lb 2.4 oz)    REVIEW OF SYSTEMS  As per history otherwise all reviewed and reported negative  Exam:  General exam: awake, alert, NAD, cooperative.  Respiratory system: coarse rales, crackles heard.  Diffuse insp/exp wheezing heard.  No increased work of breathing. Cardiovascular system: S1 & S2 heard, RRR. No JVD, murmurs, gallops, clicks or pedal  edema. Gastrointestinal system: Abdomen is nondistended, soft and nontender. Normal bowel sounds heard. Central nervous system: Alert and oriented. No focal neurological deficits. Extremities: 2+ pitting pretibial edema bilateral lower extremities.  Data Reviewed: Basic Metabolic Panel: Recent Labs  Lab 12/26/17 1335 12/27/17 0433 12/28/17 0401 12/29/17 0635  NA 144 143 142 141  K 4.1 4.1 3.6 3.7  CL 96* 94* 93* 90*  CO2 35* 37* 37* 37*  GLUCOSE 165* 211* 270* 201*  BUN 13 20 29* 28*  CREATININE 0.89 0.71 0.84 0.71  CALCIUM 9.2 9.1 9.0 9.2  MG  --   --  2.0  --    Liver Function Tests: Recent Labs  Lab 12/26/17 1335  AST 12*  ALT 16*  ALKPHOS 85  BILITOT 0.8  PROT 7.1  ALBUMIN 3.6   No results for input(s): LIPASE, AMYLASE in the last 168 hours. No results for input(s): AMMONIA in the last 168 hours. CBC: Recent Labs  Lab 12/26/17 1335  WBC 11.0*  NEUTROABS 9.8*  HGB 13.8  HCT 47.0  MCV 87.2  PLT 232   Cardiac Enzymes: Recent Labs  Lab 12/26/17 1335  TROPONINI <0.03   CBG (last 3)  Recent Labs    12/28/17 2116 12/29/17 0251 12/29/17 0801  GLUCAP 275* 194* 339*   No results found for this or any previous visit (from the past 240 hour(s)).   Studies: No results found.  Scheduled Meds: . acetylcysteine  3 mL Nebulization Q4H  . apixaban  5 mg Oral BID  . diltiazem  300 mg Oral Daily  . furosemide  80 mg Intravenous BID  . guaiFENesin  1,200 mg Oral BID  . insulin aspart  0-20 Units Subcutaneous TID WC  . insulin aspart  0-5 Units Subcutaneous QHS  . insulin aspart  18 Units Subcutaneous TID WC  . insulin glargine  50 Units Subcutaneous QHS  . ipratropium-albuterol  3 mL Nebulization Q4H  . losartan  25 mg Oral Daily  . mouth rinse  15 mL Mouth Rinse BID  . methylPREDNISolone (SOLU-MEDROL) injection  60 mg Intravenous TID  . mometasone-formoterol  2 puff Inhalation BID  . nicotine  21 mg Transdermal Daily  . pantoprazole  40 mg Oral Daily   . rosuvastatin  20 mg Oral q1800  . sodium chloride flush  3 mL Intravenous Q12H   Continuous Infusions: . sodium chloride    . ampicillin-sulbactam (UNASYN) IV 3 g (12/29/17 0846)    Active Problems:   Diabetes mellitus ([place])   HLD (hyperlipidemia)   TOBACCO ABUSE   ATRIAL FIBRILLATION   CHRONIC OBSTRUCTIVE PULMONARY DISEASE, ACUTE EXACERBATION   Acute on chronic respiratory failure with hypoxia (HCC)   Acute respiratory failure (HCC)   Aspiration pneumonia (HCC)   Dysphagia   Acute on chronic diastolic CHF (congestive heart failure) ([place])  Time spent:   Irwin Brakeman, MD, FAAFP [facility] Pager [phone]  If 7PM-7AM, please contact night-coverage [email or web address] Password [facility] 12/29/2017, 11:51 AM  LOS: 3 days

## 2017-12-29 NOTE — Care Management Important Message (Signed)
Important Message  Patient Details  Name: Andrew [name] MRN: [id number] Date of Birth: 16-Sep-1940   Medicare Important Message Given:  Yes    Sherald Barge, RN 12/29/2017, 12:52 PM

## 2017-12-29 NOTE — Progress Notes (Signed)
Inpatient Diabetes Program Recommendations  AACE/ADA: New Consensus Statement on Inpatient Glycemic Control (2015)  Target Ranges:  Prepandial:   less than 140 mg/dL      Peak postprandial:   less than 180 mg/dL (1-2 hours)      Critically ill patients:  140 - 180 mg/dL   Results for Andrew [name], Andrew [name] (MRN [id number]) as of 12/29/2017 08:54  Ref. Range 12/28/2017 07:23 12/28/2017 11:30 12/28/2017 16:12 12/28/2017 21:16 12/29/2017 02:51 12/29/2017 08:01  Glucose-Capillary Latest Ref Range: 65 - 99 mg/dL 281 (H)  Novolog 23 units 228 (H)  Novolog 21 units 149 (H)  Novolog 17 units 275 (H)  Novolog 3 units  Lantus 40 units 194 (H) 339 (H)  Novolog 29 units   Review of Glycemic Control  Diabetes history: DM2 Outpatient Diabetes medications: Lantus 10 units QHS, Novolog 0-8 units TID, Glipizide XL 2.5 mg daily Current orders for Inpatient glycemic control: Lantus 40 units QHS, Novolog 14 units TID with meals for meal coverage, Novolog 0-20 units TID with meals, Novolog 0-5 units QHS; Solumedrol 60 mg TID  Inpatient Diabetes Program Recommendations: Insulin - Basal: If steroids are continued as ordered, please consider increasing Lantus to 45 units QHS. Insulin - Meal Coverage: If steroids are continued as ordered, please consider increasing Novolog meal coverage to Novolog 18 units TID with meals if patient eats at least 50% of meals.  Thanks, Barnie Alderman, RN, MSN, CDE Diabetes Coordinator Inpatient Diabetes Program [phone] (Team Pager from 8am to 5pm)

## 2017-12-30 DIAGNOSIS — E785 Hyperlipidemia, unspecified: Secondary | ICD-10-CM

## 2017-12-30 LAB — BASIC METABOLIC PANEL
Anion gap: 13 (ref 5–15)
BUN: 30 mg/dL — ABNORMAL HIGH (ref 6–20)
CHLORIDE: 89 mmol/L — AB (ref 101–111)
CO2: 39 mmol/L — ABNORMAL HIGH (ref 22–32)
Calcium: 8.8 mg/dL — ABNORMAL LOW (ref 8.9–10.3)
Creatinine, Ser: 0.79 mg/dL (ref 0.61–1.24)
Glucose, Bld: 305 mg/dL — ABNORMAL HIGH (ref 65–99)
POTASSIUM: 3 mmol/L — AB (ref 3.5–5.1)
SODIUM: 141 mmol/L (ref 135–145)

## 2017-12-30 LAB — GLUCOSE, CAPILLARY
GLUCOSE-CAPILLARY: 162 mg/dL — AB (ref 65–99)
GLUCOSE-CAPILLARY: 344 mg/dL — AB (ref 65–99)
Glucose-Capillary: 442 mg/dL — ABNORMAL HIGH (ref 65–99)

## 2017-12-30 MED ORDER — AMOXICILLIN-POT CLAVULANATE 875-125 MG PO TABS: 1.0000 | ORAL_TABLET | Freq: Two times a day (BID) | ORAL | 0 refills | Status: AC

## 2017-12-30 MED ORDER — APIXABAN 5 MG PO TABS: 5.0000 mg | ORAL_TABLET | Freq: Two times a day (BID) | ORAL | 0 refills | Status: DC

## 2017-12-30 MED ORDER — POTASSIUM CHLORIDE CRYS ER 20 MEQ PO TBCR
60.0000 meq | EXTENDED_RELEASE_TABLET | Freq: Once | ORAL | Status: AC
  Administered 2017-12-30: 60 meq via ORAL
  Filled 2017-12-30: qty 3

## 2017-12-30 MED ORDER — INSULIN ASPART 100 UNIT/ML ~~LOC~~ SOLN
27.0000 [IU] | Freq: Once | SUBCUTANEOUS | Status: AC
  Administered 2017-12-30: 27 [IU] via SUBCUTANEOUS

## 2017-12-30 MED ORDER — LOSARTAN POTASSIUM 25 MG PO TABS: 25.0000 mg | ORAL_TABLET | Freq: Every day | ORAL | 0 refills | Status: DC

## 2017-12-30 MED ORDER — POTASSIUM CHLORIDE CRYS ER 20 MEQ PO TBCR: 20.0000 meq | EXTENDED_RELEASE_TABLET | Freq: Every day | ORAL | 0 refills | Status: DC

## 2017-12-30 MED ORDER — GUAIFENESIN ER 600 MG PO TB12: 1200.0000 mg | ORAL_TABLET | Freq: Two times a day (BID) | ORAL | 0 refills | Status: AC

## 2017-12-30 MED ORDER — GLIPIZIDE ER 5 MG PO TB24: 5.0000 mg | ORAL_TABLET | Freq: Every day | ORAL | Status: DC

## 2017-12-30 MED ORDER — FUROSEMIDE 40 MG PO TABS: 40.0000 mg | ORAL_TABLET | Freq: Every day | ORAL | 0 refills | Status: DC

## 2017-12-30 MED ORDER — PREDNISONE 10 MG PO TABS: ORAL_TABLET | ORAL | Status: DC

## 2017-12-30 MED ORDER — INSULIN GLARGINE 100 UNITS/ML SOLOSTAR PEN: 25.0000 [IU] | PEN_INJECTOR | Freq: Every day | SUBCUTANEOUS | 0 refills | Status: DC

## 2017-12-30 NOTE — Discharge Instructions (Addendum)
Please monitor blood sugars closely and report to your regular doctor if your blood sugar is higher than 300 for more than 3 occasions.  Please see your primary care provider in next 5-7 days for recheck.    Home Oxygen Use, Adult When a medical condition keeps you from getting enough oxygen, your health care provider may instruct you to take extra oxygen at home. Your health care provider will let you know:  When to take oxygen.  For how long to take oxygen.  How quickly oxygen should be delivered (flow rate), in liters per minute (LPM or L/M).  Home oxygen can be given through:  A mask.  A nasal cannula. This is a device or tube that goes in the nostrils.  A transtracheal catheter. This is a small, flexible tube placed in the trachea.  A tracheostomy. This is a surgically made opening in the trachea.  These devices are connected with tubing to an oxygen source, such as:  A tank. Tanks hold oxygen in gas form. They must be replaced when the oxygen is used up.  A liquid oxygen device. This holds oxygen in liquid form. It must be replaced when the oxygen is used up.  An oxygen concentrator machine. This filters oxygen in the room. It uses electricity, so you must have a backup cylinder of oxygen in case the power goes out.  Supplies needed: To use oxygen, you will need:  A mask, nasal cannula, transtracheal catheter, or tracheostomy.  An oxygen tank, a liquid oxygen device, or an oxygen concentrator.  The tape that your health care provider recommends (optional).  If you use a transtracheal catheter and your prescribed flow rate is 1 LPM or greater, you will also need a humidifier. Risks and complications  Fire. This can happen if the oxygen is exposed to a heat source, flame, or spark.  Injury to skin. This can happen if liquid oxygen touches your skin.  Organ damage. This can happen if you get too little oxygen. How to use oxygen Your health care provider will show  you how to use your oxygen device. Follow her or his instructions. They may look something like this: 1. Wash your hands. 2. If you use an oxygen concentrator, make sure it is plugged in. 3. Place one end of the tube into the port on the tank, device, or machine. 4. Place the mask over your nose and mouth. Or, place the nasal cannula and secure it with tape if instructed. If you use a tracheostomy or transtracheal catheter, connect it to the oxygen source as directed. 5. Make sure the liter-flow setting on the machine is at the level prescribed by your health care provider. 6. Turn on the machine or adjust the knob on the tank or device to the correct liter-flow setting. 7. When you are done, turn off and unplug the machine, or turn the knob to OFF.  How to clean and care for the oxygen supplies Nasal cannula  Clean it with a warm, wet cloth daily or as needed.  Wash it with a liquid soap once a week.  Rinse it thoroughly once or twice a week.  Replace it every 2-4 weeks.  If you have an infection, such as a cold or pneumonia, change the cannula when you get better. Mask  Replace it every 2-4 weeks.  If you have an infection, such as a cold or pneumonia, change the mask when you get better. Humidifier bottle  Wash the bottle between each  refill: ? Wash it with soap and warm water. ? Rinse it thoroughly. ? Disinfect it and its top. ? Air-dry it.  Make sure it is dry before you refill it. Oxygen concentrator  Clean the air filter at least twice a week according to directions from your home medical equipment and service company.  Wipe down the cabinet every day. To do this: ? Unplug the unit. ? Wipe down the cabinet with a damp cloth. ? Dry the cabinet. Other equipment  Change any extra tubing every 1-3 months.  Follow instructions from your health care provider about taking care of any other equipment. Safety tips Fire safety tips   Keep your oxygen and oxygen  supplies at least 5 ft away from sources of heat, flames, and sparks at all times.  Do not allow smoking near your oxygen. Put up "no smoking" signs in your home.  Do not use materials that can burn (are flammable) while you use oxygen.  When you go to a restaurant with portable oxygen, ask to be seated in the nonsmoking section.  Keep a Data processing manager close by. Let your fire department know that you have oxygen in your home.  Test your home smoke detectors regularly. General safety tips  If you use an oxygen cylinder, make sure it is in a stand or secured to an object that will not move (fixed object).  If you use liquid oxygen, make sure its container is kept upright.  If you use an oxygen concentrator: ? [employer] movement psychotherapist company. Make sure you are given priority service in the event that your power goes out. ? Avoid using extension cords, if possible. Follow these instructions at home:  Use oxygen only as told by your health care provider.  Do not use alcohol or other drugs that make you relax (sedating drugs) unless instructed. They can slow down your breathing rate and make it hard to get in enough oxygen.  Know how and when to order a refill of oxygen.  Always keep a spare tank of oxygen. Plan ahead for holidays when you may not be able to get a prescription filled.  Use water-based lubricants on your lips or nostrils. Do not use oil-based products like petroleum jelly.  To prevent skin irritation on your cheeks or behind your ears, tuck some gauze under the tubing. Contact a health care provider if:  You get headaches often.  You have shortness of breath.  You have a lasting cough.  You have anxiety.  You are sleepy all the time.  You develop an illness that affects your breathing.  You cannot exercise at your regular level.  You are restless.  You have difficult or irregular breathing, and it is getting worse.  You have a fever.  You have persistent  redness under your nose. Get help right away if:  You are confused.  You have blue lips or fingernails.  You are struggling to breathe. This information is not intended to replace advice given to you by your health care provider. Make sure you discuss any questions you have with your health care provider. Document Released: 11/12/2003 Document Revised: 04/20/2016 Document Reviewed: 03/15/2016 Elsevier Interactive Patient Education  2018 [place]   Follow with Primary MD  [facility], [facility]  and other consultant's as instructed your Hospitalist MD  Please get a complete blood count and chemistry panel checked by your Primary MD at your next visit, and again as instructed by your Primary MD.  Get Medicines  reviewed and adjusted: Please take all your medications with you for your next visit with your Primary MD  Laboratory/radiological data: Please request your Primary MD to go over all hospital tests and procedure/radiological results at the follow up, please ask your Primary MD to get all Hospital records sent to his/her office.  In some cases, they will be blood work, cultures and biopsy results pending at the time of your discharge. Please request that your primary care M.D. follows up on these results.  Also Note the following: If you experience worsening of your admission symptoms, develop shortness of breath, life threatening emergency, suicidal or homicidal thoughts you must seek medical attention immediately by calling 911 or calling your MD immediately  if symptoms less severe.  You must read complete instructions/literature along with all the possible adverse reactions/side effects for all the Medicines you take and that have been prescribed to you. Take any new Medicines after you have completely understood and accpet all the possible adverse reactions/side effects.   Do not drive when taking Pain medications or sleeping medications (Benzodaizepines)  Do not take  more than prescribed Pain, Sleep and Anxiety Medications. It is not advisable to combine anxiety,sleep and pain medications without talking with your primary care practitioner  Special Instructions: If you have smoked or chewed Tobacco  in the last 2 yrs please stop smoking, stop any regular Alcohol  and or any Recreational drug use.  Wear Seat belts while driving.  Please note: You were cared for by a hospitalist during your hospital stay. Once you are discharged, your primary care physician will handle any further medical issues. Please note that NO REFILLS for any discharge medications will be authorized once you are discharged, as it is imperative that you return to your primary care physician (or establish a relationship with a primary care physician if you do not have one) for your post hospital discharge needs so that they can reassess your need for medications and monitor your lab values.      Acute Respiratory Failure, Adult Acute respiratory failure occurs when there is not enough oxygen passing from your lungs to your body. When this happens, your lungs have trouble removing carbon dioxide from the blood. This causes your blood oxygen level to drop too low as carbon dioxide builds up. Acute respiratory failure is a medical emergency. It can develop quickly, but it is temporary if treated promptly. Your lung capacity, or how much air your lungs can hold, may improve with time, exercise, and treatment. What are the causes? There are many possible causes of acute respiratory failure, including:  Lung injury.  Chest injury or damage to the ribs or tissues near the lungs.  Lung conditions that affect the flow of air and blood into and out of the lungs, such as pneumonia, acute respiratory distress syndrome, and cystic fibrosis.  Medical conditions, such as strokes or spinal cord injuries, that affect the muscles and nerves that control breathing.  Blood infection  (sepsis).  Inflammation of the pancreas (pancreatitis).  A blood clot in the lungs (pulmonary embolism).  A large-volume blood transfusion.  Burns.  Near-drowning.  Seizure.  Smoke inhalation.  Reaction to medicines.  Alcohol or drug overdose.  What increases the risk? This condition is more likely to develop in people who have:  A blocked airway.  Asthma.  A condition or disease that damages or weakens the muscles, nerves, bones, or tissues that are involved in breathing.  A serious infection.  A health  problem that blocks the unconscious reflex that is involved in breathing, such as hypothyroidism or sleep apnea.  A lung injury or trauma.  What are the signs or symptoms? Trouble breathing is the main symptom of acute respiratory failure. Symptoms may also include:  Rapid breathing.  Restlessness or anxiety.  Skin, lips, or fingernails that appear blue (cyanosis).  Rapid heart rate.  Abnormal heart rhythms (arrhythmias).  Confusion or changes in behavior.  Tiredness or loss of energy.  Feeling sleepy or having a loss of consciousness.  How is this diagnosed? Your health care provider can diagnose acute respiratory failure with a medical history and physical exam. During the exam, your health care provider will listen to your heart and check for crackling or wheezing sounds in your lungs. Your may also have tests to confirm the diagnosis and determine what is causing respiratory failure. These tests may include:  Measuring the amount of oxygen in your blood (pulse oximetry). The measurement comes from a small device that is placed on your finger, earlobe, or toe.  Other blood tests to measure blood gases and to look for signs of infection.  Sampling your cerebral spinal fluid or tracheal fluid to check for infections.  Chest X-ray to look for fluid in spaces that should be filled with air.  Electrocardiogram (ECG) to look at the heart's electrical  activity.  How is this treated? Treatment for this condition usually takes places in a hospital intensive care unit (ICU). Treatment depends on what is causing the condition. It may include one or more treatments until your symptoms improve. Treatment may include:  Supplemental oxygen. Extra oxygen is given through a tube in the nose, a face mask, or a hood.  A device such as a continuous positive airway pressure (CPAP) or bi-level positive airway pressure (BiPAP or BPAP) machine. This treatment uses mild air pressure to keep the airways open. A mask or other device will be placed over your nose or mouth. A tube that is connected to a motor will deliver oxygen through the mask.  Ventilator. This treatment helps move air into and out of the lungs. This may be done with a bag and mask or a machine. For this treatment, a tube is placed in your windpipe (trachea) so air and oxygen can flow to the lungs.  Extracorporeal membrane oxygenation (ECMO). This treatment temporarily takes over the function of the heart and lungs, supplying oxygen and removing carbon dioxide. ECMO gives the lungs a chance to recover. It may be used if a ventilator is not effective.  Tracheostomy. This is a procedure that creates a hole in the neck to insert a breathing tube.  Receiving fluids and medicines.  Rocking the bed to help breathing.  Follow these instructions at home:  Take over-the-counter and prescription medicines only as told by your health care provider.  Return to normal activities as told by your health care provider. Ask your health care provider what activities are safe for you.  Keep all follow-up visits as told by your health care provider. This is important. How is this prevented? Treating infections and medical conditions that may lead to acute respiratory failure can help prevent the condition from developing. Contact a health care provider if:  You have a fever.  Your symptoms do not  improve or they get worse. Get help right away if:  You are having trouble breathing.  You lose consciousness.  Your have cyanosis or turn blue.  You develop a rapid heart rate.  You are confused. These symptoms may represent a serious problem that is an emergency. Do not wait to see if the symptoms will go away. Get medical help right away. Call your local emergency services (911 in the [place] Do not drive yourself to the hospital. This information is not intended to replace advice given to you by your health care provider. Make sure you discuss any questions you have with your health care provider. Document Released: 08/27/2013 Document Revised: 03/19/2016 Document Reviewed: 03/09/2016 Elsevier Interactive Patient Education  2018 [employer].   Chronic Obstructive Pulmonary Disease Exacerbation Chronic obstructive pulmonary disease (COPD) is a long-term (chronic) condition that affects the lungs. COPD is a general term that can be used to describe many different lung problems that cause lung swelling (inflammation) and limit airflow, including chronic bronchitis and emphysema. COPD exacerbations are episodes when breathing symptoms become much worse and require extra treatment. COPD exacerbations are usually caused by infections. Without treatment, COPD exacerbations can be severe and even life threatening. Frequent COPD exacerbations can cause further damage to the lungs. What are the causes? This condition may be caused by:  Respiratory infections, including viral and bacterial infections.  Exposure to smoke.  Exposure to air pollution, chemical fumes, or dust.  Things that give you an allergic reaction (allergens).  Not taking your usual COPD medicines as directed.  Underlying medical problems, such as congestive heart failure or infections not involving the lungs.  In many cases, the cause (trigger) of this condition is not known. What increases the risk? The following  factors may make you more likely to develop this condition:  Smoking cigarettes.  Old age.  Frequent prior COPD exacerbations.  What are the signs or symptoms? Symptoms of this condition include:  Increased coughing.  Increased production of mucus from your lungs (sputum).  Increased wheezing.  Increased shortness of breath.  Rapid or labored breathing.  Chest tightness.  Less energy than usual.  Sleep disruption from symptoms.  Confusion or increased sleepiness.  Often these symptoms happen or get worse even with the use of medicines. How is this diagnosed? This condition is diagnosed based on:  Your medical history.  A physical exam.  You may also have tests, including:  A chest X-ray.  Blood tests.  Lung (pulmonary) function tests.  How is this treated? Treatment for this condition depends on the severity and cause of the symptoms. You may need to be admitted to a hospital for treatment. Some of the treatments commonly used to treat COPD exacerbations are:  Antibiotic medicines. These may be used for severe exacerbations caused by a lung infection, such as pneumonia.  Bronchodilators. These are inhaled medicines that expand the air passages and allow increased airflow.  Steroid medicines. These act to reduce inflammation in the airways. They may be given with an inhaler, taken by mouth, or given through an IV tube inserted into one of your veins.  Supplemental oxygen therapy.  Airway clearing techniques, such as noninvasive ventilation (NIV) and positive expiratory pressure (PEP). These provide respiratory support through a mask or other noninvasive device. An example of this would be using a continuous positive airway pressure (CPAP) machine to improve delivery of oxygen into your lungs.  Follow these instructions at home: Medicines  Take over-the-counter and prescription medicines only as told by your health care provider. It is important to use  correct technique with inhaled medicines.  If you were prescribed an antibiotic medicine or oral steroid, take it as told by your  health care provider. Do not stop taking the medicine even if you start to feel better. Lifestyle  Eat a healthy diet.  Exercise regularly.  Get plenty of sleep.  Avoid exposure to all substances that irritate the airway, especially to tobacco smoke.  Wash your hands often with soap and water to reduce the risk of infection. If soap and water are not available, use hand sanitizer.  During flu season, avoid enclosed spaces that are crowded with people. General instructions  Drink enough fluid to keep your urine clear or pale yellow (unless you have a medical condition that requires fluid restriction).  Use a cool mist vaporizer. This humidifies the air and makes it easier for you to clear your chest when you cough.  If you have a home nebulizer and oxygen, continue to use them as told by your health care provider.  Keep all follow-up visits as told by your health care provider. This is important. How is this prevented?  Stay up-to-date on pneumococcal and influenza (flu) vaccines. A flu shot is recommended every year to help prevent exacerbations.  Do not use any products that contain nicotine or tobacco, such as cigarettes and e-cigarettes. Quitting smoking is very important in preventing COPD from getting worse and in preventing exacerbations from happening as often. If you need help quitting, ask your health care provider.  Follow all instructions for pulmonary rehabilitation after a recent exacerbation. This can help prevent future exacerbations.  Work with your health care provider to develop and follow an action plan. This tells you what steps to take when you experience certain symptoms. Contact a health care provider if:  You have a worsening of your regular COPD symptoms. Get help right away if:  You have worsening shortness of breath, even  when resting.  You have trouble talking.  You have severe chest pain.  You cough up blood.  You have a fever.  You have weakness, vomit repeatedly, or faint.  You feel confused.  You are not able to sleep because of your symptoms.  You have trouble doing daily activities. Summary  COPD exacerbations are episodes when breathing symptoms become much worse and require extra treatment above your normal treatment.  Exacerbations can be severe and even life threatening. Frequent COPD exacerbations can cause further damage to your lungs.  COPD exacerbations are usually triggered by infections such as the flu, colds, and even pneumonia.  Treatment for this condition depends on the severity and cause of the symptoms. You may need to be admitted to a hospital for treatment.  Quitting smoking is very important to prevent COPD from getting worse and to prevent exacerbations from happening as often. This information is not intended to replace advice given to you by your health care provider. Make sure you discuss any questions you have with your health care provider. Document Released: 06/19/2007 Document Revised: 09/26/2016 Document Reviewed: 09/26/2016 Elsevier Interactive Patient Education  2018 [place].    Blood Glucose Monitoring, Adult Monitoring your blood sugar (glucose) helps you manage your diabetes. It also helps you and your health care provider determine how well your diabetes management plan is working. Blood glucose monitoring involves checking your blood glucose as often as directed, and keeping a record (log) of your results over time. Why should I monitor my blood glucose? Checking your blood glucose regularly can:  Help you understand how food, exercise, illnesses, and medicines affect your blood glucose.  Let you know what your blood glucose is at any time.  You can quickly tell if you are having low blood glucose (hypoglycemia) or high blood glucose  (hyperglycemia).  Help you and your health care provider adjust your medicines as needed.  When should I check my blood glucose? Follow instructions from your health care provider about how often to check your blood glucose. This may depend on:  The type of diabetes you have.  How well-controlled your diabetes is.  Medicines you are taking.  If you have type 1 diabetes:  Check your blood glucose at least 2 times a day.  Also check your blood glucose: ? Before every insulin injection. ? Before and after exercise. ? Between meals. ? 2 hours after a meal. ? Occasionally between 2:00 a.m. and 3:00 a.m., as directed. ? Before potentially dangerous tasks, like driving or using heavy machinery. ? At bedtime.  You may need to check your blood glucose more often, up to 6-10 times a day: ? If you use an insulin pump. ? If you need multiple daily injections (MDI). ? If your diabetes is not well-controlled. ? If you are ill. ? If you have a history of severe hypoglycemia. ? If you have a history of not knowing when your blood glucose is getting low (hypoglycemia unawareness). If you have type 2 diabetes:  If you take insulin or other diabetes medicines, check your blood glucose at least 2 times a day.  If you are on intensive insulin therapy, check your blood glucose at least 4 times a day. Occasionally, you may also need to check between 2:00 a.m. and 3:00 a.m., as directed.  Also check your blood glucose: ? Before and after exercise. ? Before potentially dangerous tasks, like driving or using heavy machinery.  You may need to check your blood glucose more often if: ? Your medicine is being adjusted. ? Your diabetes is not well-controlled. ? You are ill. What is a blood glucose log?  A blood glucose log is a record of your blood glucose readings. It helps you and your health care provider: ? Look for patterns in your blood glucose over time. ? Adjust your diabetes management  plan as needed.  Every time you check your blood glucose, write down your result and notes about things that may be affecting your blood glucose, such as your diet and exercise for the day.  Most glucose meters store a record of glucose readings in the meter. Some meters allow you to download your records to a computer. How do I check my blood glucose? Follow these steps to get accurate readings of your blood glucose: Supplies needed   Blood glucose meter.  Test strips for your meter. Each meter has its own strips. You must use the strips that come with your meter.  A needle to prick your finger (lancet). Do not use lancets more than once.  A device that holds the lancet (lancing device).  A journal or log book to write down your results. Procedure  Wash your hands with soap and water.  Prick the side of your finger (not the tip) with the lancet. Use a different finger each time.  Gently rub the finger until a small drop of blood appears.  Follow instructions that come with your meter for inserting the test strip, applying blood to the strip, and using your blood glucose meter.  Write down your result and any notes. Alternative testing sites  Some meters allow you to use areas of your body other than your finger (alternative sites) to test  your blood.  If you think you may have hypoglycemia, or if you have hypoglycemia unawareness, do not use alternative sites. Use your finger instead.  Alternative sites may not be as accurate as the fingers, because blood flow is slower in these areas. This means that the result you get may be delayed, and it may be different from the result that you would get from your finger.  The most common alternative sites are: ? Forearm. ? Thigh. ? Palm of the hand. Additional tips  Always keep your supplies with you.  If you have questions or need help, all blood glucose meters have a 24-hour hotline number that you can call. You may also  contact your health care provider.  After you use a few boxes of test strips, adjust (calibrate) your blood glucose meter by following instructions that came with your meter. This information is not intended to replace advice given to you by your health care provider. Make sure you discuss any questions you have with your health care provider. Document Released: 08/25/2003 Document Revised: 03/11/2016 Document Reviewed: 02/01/2016 Elsevier Interactive Patient Education  2017 [place] on my medicine - ELIQUIS (apixaban)  This medication education was reviewed with me or my healthcare representative as part of my discharge preparation.  Why was Eliquis prescribed for you? Eliquis was prescribed for you to reduce the risk of a blood clot forming that can cause a stroke if you have a medical condition called atrial fibrillation (a type of irregular heartbeat).  What do You need to know about Eliquis ? Take your Eliquis TWICE DAILY - one tablet in the morning and one tablet in the evening with or without food. If you have difficulty swallowing the tablet whole please discuss with your pharmacist how to take the medication safely.  Take Eliquis exactly as prescribed by your doctor and DO NOT stop taking Eliquis without talking to the doctor who prescribed the medication.  Stopping may increase your risk of developing a stroke.  Refill your prescription before you run out.  After discharge, you should have regular check-up appointments with your healthcare provider that is prescribing your Eliquis.  In the future your dose may need to be changed if your kidney function or weight changes by a significant amount or as you get older.  What do you do if you miss a dose? If you miss a dose, take it as soon as you remember on the same day and resume taking twice daily.  Do not take more than one dose of ELIQUIS at the same time to make up a missed dose.  Important Safety  Information A possible side effect of Eliquis is bleeding. You should call your healthcare provider right away if you experience any of the following: ? Bleeding from an injury or your nose that does not stop. ? Unusual colored urine (red or dark brown) or unusual colored stools (red or black). ? Unusual bruising for unknown reasons. ? A serious fall or if you hit your head (even if there is no bleeding).  Some medicines may interact with Eliquis and might increase your risk of bleeding or clotting while on Eliquis. To help avoid this, consult your healthcare provider or pharmacist prior to using any new prescription or non-prescription medications, including herbals, vitamins, non-steroidal anti-inflammatory drugs (NSAIDs) and supplements.  This website has more information on Eliquis (apixaban): http://www.eliquis.com/eliquis/home

## 2017-12-30 NOTE — Discharge Summary (Addendum)
Physician Discharge Summary  Andrew [name] ION:[id number] DOB: 14-Nov-1940 DOA: 12/26/2017  PCP: [facility]  Admit date: 12/26/2017 Discharge date: 12/30/2017  Admitted From: HOME  Disposition: HOME  Recommendations for Outpatient Follow-up:  1. Follow up with PCP in 5-7 days 2. Please obtain BMP/CBC in one week 3. Please follow up on the following pending results: final culture data  Discharge Condition: STABLE   CODE STATUS: FULL    Brief Hospitalization Summary: Please see all hospital notes, images, labs for full details of the hospitalization.  HPI: Andrew [name] is a 77 y.o. male with medical history significant of COPD, chronic respiratory failure, chronic diastolic congestive heart failure, dysphagia with recurrent aspiration, presents to the hospital with complaints of shortness of breath.  Patient reports over the past week he has been having progressive shortness of breath.  Daughter reports that he has been having increasing cough, wheezing shortness of breath.  She is also noticed worsening lower extremity edema.  The patient continues to smoke cigarettes.  He is been using his nebulizer at home without significant benefit.  He has not had any chest pain.  He is not had any fevers.  Daughter reports that he has had swallow evaluations in the past and has been told that he has dysphagia.  He was unable to tolerate thickened foods she notes that he often coughs after eating.  ED Course: Patient was noted to have increased respiratory effort on arrival.  Chest x-ray indicated hyperinflation.  Diffuse increased interstitial opacity, suspect for acute interstitial inflammatory process or mild underlying chronic change.  More confluent airspace disease at the lingula and left base suspicious for pneumonia.  Small left pleural effusion.  EKG did not show any acute changes.  Remainder of lab work was relatively unrevealing.  BNP was 119.  Patient has been referred for  admission.   Brief Admission Hx: Andrew [name] a 77 y.o.malewith medical history significant ofCOPD, chronic respiratory failure, chronic diastolic congestive heart failure, dysphagia with recurrent aspiration, presents to the hospital with complaints of shortness of breath .   MDM/Assessment & Plan:   1. Acute on chronic respiratory failure. Multifactorial, related to COPD, probable aspiration as well as a component of CHF. Will try and wean down oxygen as tolerated but he will discharge on home oxygen.  He feels like he is at his baseline and can manage at home.  He will follow up with the [facility] for his primary care and specialty care.  Discharging on steroid taper and antibiotics. Resume home respiratory medications and home oxygen.  2. Aspiration pneumonia. Patient has a history of dysphagia. SLP saw him and placed him on a regular texture diet, thin liquids.   3. COPD exacerbation. clinically he says that he feels back to his baseline and wants to go home.   4. Acute on chronic diastolic congestve heart failure. Has evidence of lower extremity edema. Started on intravenous Lasix.  Updated echocardiogram: mild LVH, EF 60-65%, indeterminate DD.   5. Atrial fibrillation. History of paroxysmal atrial fibrillation. Currently appears to be in sinus rhythm. Continue on diltiazem for rate control. He was on anticoagulation in the past, but at some point this was taken off of his list. Review of records does not have any clear reason that anticoagulation was discontinued. After discussing with the patient, he does not report any history of falls or significant bleeding. We started him back on apixaban. CHADSVAScscore of at [place].  Follow up with PCP.  6. Diabetes, type 2 with hyperglycemia. exacerbated by steroids  Increased dose of Lantus. Follow BS closely at home and report any blood sugars greater than 300 to PCP.  7. Hyperlipidemia. Continue on statin 8. Hypertension.  Continue on diltiazem. He is also started on losartan. Follow blood pressure. 9. Tobacco use. Counseled extensively on the importance of tobacco cessation. We will continue nicotine patch. i DVT prophylaxis:Apixaban Code Status:Full code, confirmed with patient Family Communication:Discussed with family at the bedside Disposition Plan:Discharge home Consults called: Admission status:Inpatient, telemetry  Discharge Diagnoses:  Active Problems:   Diabetes mellitus (HCC)   HLD (hyperlipidemia)   TOBACCO ABUSE   ATRIAL FIBRILLATION   CHRONIC OBSTRUCTIVE PULMONARY DISEASE, ACUTE EXACERBATION   Acute on chronic respiratory failure with hypoxia (HCC)   Acute respiratory failure (HCC)   Aspiration pneumonia (HCC)   Dysphagia   Acute on chronic diastolic CHF (congestive heart failure) ([place])  Discharge Instructions: Discharge Instructions    (HEART FAILURE PATIENTS) Call MD:  Anytime you have any of the following symptoms: 1) 3 pound weight gain in 24 hours or 5 pounds in 1 week 2) shortness of breath, with or without a dry hacking cough 3) swelling in the hands, feet or stomach 4) if you have to sleep on extra pillows at night in order to breathe.   Complete by:  As directed    Call MD for:  difficulty breathing, headache or visual disturbances   Complete by:  As directed    Call MD for:  extreme fatigue   Complete by:  As directed    Call MD for:  persistant dizziness or light-headedness   Complete by:  As directed    Diet - low sodium heart healthy   Complete by:  As directed    Increase activity slowly   Complete by:  As directed      Allergies as of 12/30/2017      Reactions   Procaine Other (See Comments), Shortness Of Breath   Syncope   Procaine Hcl Anaphylaxis, Swelling      Medication List    STOP taking these medications   nicotine 21 mg/24hr patch Commonly known as:  NICODERM CQ - dosed in mg/24 hours     TAKE these medications   acetaminophen 650 MG  CR tablet Commonly known as:  TYLENOL Take 650 mg by mouth every 8 (eight) hours as needed for pain.   albuterol (2.5 MG/3ML) 0.083% nebulizer solution Commonly known as:  PROVENTIL Take 3 mLs (2.5 mg total) by nebulization every 4 (four) hours as needed for wheezing or shortness of breath.   ALPRAZolam 0.25 MG tablet Commonly known as:  XANAX Take 1 tablet (0.25 mg total) by mouth 3 (three) times daily as needed for anxiety.   amoxicillin-clavulanate 875-125 MG tablet Commonly known as:  AUGMENTIN Take 1 tablet by mouth 2 (two) times daily for 5 days.   apixaban 5 MG Tabs tablet Commonly known as:  ELIQUIS Take 1 tablet (5 mg total) by mouth 2 (two) times daily.   aspirin 325 MG EC tablet Take 325 mg by mouth daily.   cyclobenzaprine 10 MG tablet Commonly known as:  FLEXERIL Take 5 mg by mouth at bedtime.   diltiazem 300 MG 24 hr capsule Commonly known as:  TIAZAC Take 1 capsule (300 mg total) by mouth daily.   diphenhydrAMINE 25 MG tablet Commonly known as:  BENADRYL Take 1 tablet by mouth at bedtime.   Fluticasone-Salmeterol 500-50 MCG/DOSE Aepb Commonly known as:  ADVAIR DISKUS Inhale 1 puff into the lungs 2 (two) times daily.   furosemide 40 MG tablet Commonly known as:  LASIX Take 1 tablet (40 mg total) by mouth daily. Start taking on:  12/31/2017   glipiZIDE 5 MG 24 hr tablet Commonly known as:  GLUCOTROL XL Take 1 tablet (5 mg total) by mouth daily. What changed:  how much to take   guaiFENesin 600 MG 12 hr tablet Commonly known as:  MUCINEX Take 2 tablets (1,200 mg total) by mouth 2 (two) times daily for 5 days.   ibuprofen 200 MG tablet Commonly known as:  ADVIL,MOTRIN Take 1 tablet (200 mg total) by mouth every 8 (eight) hours as needed (back pain).   insulin aspart 100 UNIT/ML FlexPen Commonly known as:  NOVOLOG Check CBG's there etimes daily before meals. For CBG less than 150: 0 Units For CBG 150-200: 2 Units SQ For CBG 200-250: 4 Units  SQ For CBG 250-300: 6 Units SQ For CBG 300-350: 8 Units SQ and call PCP for further instructions   insulin glargine 100 unit/mL Sopn Commonly known as:  LANTUS Inject 0.25 mLs (25 Units total) into the skin daily. What changed:  how much to take   losartan 25 MG tablet Commonly known as:  COZAAR Take 1 tablet (25 mg total) by mouth daily. Start taking on:  12/31/2017   Melatonin 3 MG Tabs Take 1 tablet by mouth at bedtime.   metoCLOPramide 10 MG tablet Commonly known as:  REGLAN Take 10 mg by mouth every 6 (six) hours as needed for nausea.   montelukast 10 MG tablet Commonly known as:  SINGULAIR Take 10 mg by mouth at bedtime.   omeprazole 40 MG capsule Commonly known as:  PRILOSEC Take 40 mg by mouth daily.   potassium chloride SA 20 MEQ tablet Commonly known as:  K-DUR,KLOR-CON Take 1 tablet (20 mEq total) by mouth daily for 5 doses. Start taking on:  12/31/2017   predniSONE 10 MG tablet Commonly known as:  DELTASONE Take 4 tabs daily with breakfast x 5 days, then 3 tabs daily x 5 days, then 2 tabs daily x 5 days, then resume 1 tab daily What changed:    how much to take  how to take this  when to take this  additional instructions   rosuvastatin 20 MG tablet Commonly known as:  CRESTOR Take 1 tablet (20 mg total) by mouth daily.   tiotropium 18 MCG inhalation capsule Commonly known as:  SPIRIVA HANDIHALER Place 1 capsule (18 mcg total) into inhaler and inhale daily.   tiZANidine 4 MG tablet Commonly known as:  ZANAFLEX Take 4 mg by mouth 3 (three) times daily.   vitamin B-12 500 MCG tablet Commonly known as:  CYANOCOBALAMIN Take 500 mcg by mouth 2 (two) times daily.            Durable Medical Equipment  (From admission, onward)        Start     Ordered   12/29/17 1523  For home use only DME oxygen  Once    Question Answer Comment  Mode or (Route) Nasal cannula   Liters per Minute 3   Frequency Continuous (stationary and portable oxygen  unit needed)   Oxygen delivery system Gas      12/29/17 1522     Follow-up Information    [facility] Schedule an appointment as soon as possible for a visit in 5 day(s).   Why:  Hospital Follow Up  Contact information: [place] [address] [phone]        Sinda Du, MD. Schedule an appointment as soon as possible for a visit in 2 week(s).   Specialty:  Pulmonary Disease Contact information: [address] [place] [address] [phone]          Allergies  Allergen Reactions  . Procaine Other (See Comments) and Shortness Of Breath    Syncope  . Procaine Hcl Anaphylaxis and Swelling   Allergies as of 12/30/2017      Reactions   Procaine Other (See Comments), Shortness Of Breath   Syncope   Procaine Hcl Anaphylaxis, Swelling      Medication List    STOP taking these medications   nicotine 21 mg/24hr patch Commonly known as:  NICODERM CQ - dosed in mg/24 hours     TAKE these medications   acetaminophen 650 MG CR tablet Commonly known as:  TYLENOL Take 650 mg by mouth every 8 (eight) hours as needed for pain.   albuterol (2.5 MG/3ML) 0.083% nebulizer solution Commonly known as:  PROVENTIL Take 3 mLs (2.5 mg total) by nebulization every 4 (four) hours as needed for wheezing or shortness of breath.   ALPRAZolam 0.25 MG tablet Commonly known as:  XANAX Take 1 tablet (0.25 mg total) by mouth 3 (three) times daily as needed for anxiety.   amoxicillin-clavulanate 875-125 MG tablet Commonly known as:  AUGMENTIN Take 1 tablet by mouth 2 (two) times daily for 5 days.   apixaban 5 MG Tabs tablet Commonly known as:  ELIQUIS Take 1 tablet (5 mg total) by mouth 2 (two) times daily.   aspirin 325 MG EC tablet Take 325 mg by mouth daily.   cyclobenzaprine 10 MG tablet Commonly known as:  FLEXERIL Take 5 mg by mouth at bedtime.   diltiazem 300 MG 24 hr capsule Commonly known as:   TIAZAC Take 1 capsule (300 mg total) by mouth daily.   diphenhydrAMINE 25 MG tablet Commonly known as:  BENADRYL Take 1 tablet by mouth at bedtime.   Fluticasone-Salmeterol 500-50 MCG/DOSE Aepb Commonly known as:  ADVAIR DISKUS Inhale 1 puff into the lungs 2 (two) times daily.   furosemide 40 MG tablet Commonly known as:  LASIX Take 1 tablet (40 mg total) by mouth daily. Start taking on:  12/31/2017   glipiZIDE 5 MG 24 hr tablet Commonly known as:  GLUCOTROL XL Take 1 tablet (5 mg total) by mouth daily. What changed:  how much to take   guaiFENesin 600 MG 12 hr tablet Commonly known as:  MUCINEX Take 2 tablets (1,200 mg total) by mouth 2 (two) times daily for 5 days.   ibuprofen 200 MG tablet Commonly known as:  ADVIL,MOTRIN Take 1 tablet (200 mg total) by mouth every 8 (eight) hours as needed (back pain).   insulin aspart 100 UNIT/ML FlexPen Commonly known as:  NOVOLOG Check CBG's there etimes daily before meals. For CBG less than 150: 0 Units For CBG 150-200: 2 Units SQ For CBG 200-250: 4 Units SQ For CBG 250-300: 6 Units SQ For CBG 300-350: 8 Units SQ and call PCP for further instructions   insulin glargine 100 unit/mL Sopn Commonly known as:  LANTUS Inject 0.25 mLs (25 Units total) into the skin daily. What changed:  how much to take   losartan 25 MG tablet Commonly known as:  COZAAR Take 1 tablet (25 mg total) by mouth daily. Start taking on:  12/31/2017  Melatonin 3 MG Tabs Take 1 tablet by mouth at bedtime.   metoCLOPramide 10 MG tablet Commonly known as:  REGLAN Take 10 mg by mouth every 6 (six) hours as needed for nausea.   montelukast 10 MG tablet Commonly known as:  SINGULAIR Take 10 mg by mouth at bedtime.   omeprazole 40 MG capsule Commonly known as:  PRILOSEC Take 40 mg by mouth daily.   potassium chloride SA 20 MEQ tablet Commonly known as:  K-DUR,KLOR-CON Take 1 tablet (20 mEq total) by mouth daily for 5 doses. Start taking on:   12/31/2017   predniSONE 10 MG tablet Commonly known as:  DELTASONE Take 4 tabs daily with breakfast x 5 days, then 3 tabs daily x 5 days, then 2 tabs daily x 5 days, then resume 1 tab daily What changed:    how much to take  how to take this  when to take this  additional instructions   rosuvastatin 20 MG tablet Commonly known as:  CRESTOR Take 1 tablet (20 mg total) by mouth daily.   tiotropium 18 MCG inhalation capsule Commonly known as:  SPIRIVA HANDIHALER Place 1 capsule (18 mcg total) into inhaler and inhale daily.   tiZANidine 4 MG tablet Commonly known as:  ZANAFLEX Take 4 mg by mouth 3 (three) times daily.   vitamin B-12 500 MCG tablet Commonly known as:  CYANOCOBALAMIN Take 500 mcg by mouth 2 (two) times daily.            Durable Medical Equipment  (From admission, onward)        Start     Ordered   12/29/17 1523  For home use only DME oxygen  Once    Question Answer Comment  Mode or (Route) Nasal cannula   Liters per Minute 3   Frequency Continuous (stationary and portable oxygen unit needed)   Oxygen delivery system Gas      12/29/17 1522      Procedures/Studies: Dg Chest 2 View  Result Date: 12/26/2017 CLINICAL DATA:  Short of breath EXAM: CHEST - 2 VIEW COMPARISON:  11/19/2017, 10/06/2017, 06/29/2017, CT chest 06/28/2017 FINDINGS: Hyperinflation. Small left pleural effusion. Increased interstitial opacity compared to prior. Patchy more confluent opacity at the lingula and left base. Stable cardiomediastinal silhouette. No pneumothorax. IMPRESSION: 1. Hyperinflation. Diffuse increased interstitial opacity, suspect for acute interstitial inflammatory process on mild underlying chronic change. More confluent airspace disease at the lingula and left base, suspicious for pneumonia 2. Small left pleural effusion Electronically Signed   By: Donavan Foil M.D.   On: 12/26/2017 14:36     Subjective: Pt says he feels better and wants to go home today.    Discharge Exam: Vitals:   12/30/17 0739 12/30/17 1121  BP:    Pulse:    Resp:    Temp:    SpO2: 98% 95%   Vitals:   12/29/17 2054 12/30/17 0428 12/30/17 0739 12/30/17 1121  BP: 101/73 (!) 109/48    Pulse: 61 (!) 116    Resp:      Temp: 98.4 F (36.9 C) 98 F (36.7 C)    TempSrc: Oral Oral    SpO2: 98% 98% 98% 95%  Weight:  79.6 kg (175 lb 7.8 oz)    Height:       General: Pt is alert, awake, not in acute distress Cardiovascular: RRR, S1/S2 +, no rubs, no gallops Respiratory: better air movement, diffuse wheeze Abdominal: Soft, NT, ND, bowel sounds + Extremities: 1+ LE  edema, no cyanosis   The results of significant diagnostics from this hospitalization (including imaging, microbiology, ancillary and laboratory) are listed below for reference.     Microbiology: No results found for this or any previous visit (from the past 240 hour(s)).   Labs: BNP (last 3 results) Recent Labs    06/28/17 0909 11/17/17 1231 12/26/17 1335  BNP 88.0 87.0 338.2*   Basic Metabolic Panel: Recent Labs  Lab 12/26/17 1335 12/27/17 0433 12/28/17 0401 12/29/17 0635 12/30/17 0625  NA 144 143 142 141 141  K 4.1 4.1 3.6 3.7 3.0*  CL 96* 94* 93* 90* 89*  CO2 35* 37* 37* 37* 39*  GLUCOSE 165* 211* 270* 201* 305*  BUN 13 20 29* 28* 30*  CREATININE 0.89 0.71 0.84 0.71 0.79  CALCIUM 9.2 9.1 9.0 9.2 8.8*  MG  --   --  2.0  --   --    Liver Function Tests: Recent Labs  Lab 12/26/17 1335  AST 12*  ALT 16*  ALKPHOS 85  BILITOT 0.8  PROT 7.1  ALBUMIN 3.6   No results for input(s): LIPASE, AMYLASE in the last 168 hours. No results for input(s): AMMONIA in the last 168 hours. CBC: Recent Labs  Lab 12/26/17 1335  WBC 11.0*  NEUTROABS 9.8*  HGB 13.8  HCT 47.0  MCV 87.2  PLT 232   Cardiac Enzymes: Recent Labs  Lab 12/26/17 1335  TROPONINI <0.03   BNP: Invalid input(s): POCBNP CBG: Recent Labs  Lab 12/29/17 1632 12/29/17 2056 12/30/17 0303 12/30/17 0912  12/30/17 1130  GLUCAP 126* 166* 162* 344* 442*   D-Dimer No results for input(s): DDIMER in the last 72 hours. Hgb A1c No results for input(s): HGBA1C in the last 72 hours. Lipid Profile No results for input(s): CHOL, HDL, LDLCALC, TRIG, CHOLHDL, LDLDIRECT in the last 72 hours. Thyroid function studies No results for input(s): TSH, T4TOTAL, T3FREE, THYROIDAB in the last 72 hours.  Invalid input(s): FREET3 Anemia work up No results for input(s): VITAMINB12, FOLATE, FERRITIN, TIBC, IRON, RETICCTPCT in the last 72 hours. Urinalysis    Component Value Date/Time   COLORURINE AMBER (A) 05/05/2013 1621   APPEARANCEUR CLEAR 05/05/2013 1621   LABSPEC 1.025 05/05/2013 1621   PHURINE 5.5 05/05/2013 1621   GLUCOSEU NEGATIVE 05/05/2013 1621   HGBUR NEGATIVE 05/05/2013 1621   HGBUR negative 03/31/2009 1514   BILIRUBINUR SMALL (A) 05/05/2013 1621   KETONESUR NEGATIVE 05/05/2013 1621   PROTEINUR NEGATIVE 05/05/2013 1621   UROBILINOGEN 1.0 05/05/2013 1621   NITRITE NEGATIVE 05/05/2013 1621   LEUKOCYTESUR NEGATIVE 05/05/2013 1621   Sepsis Labs Invalid input(s): PROCALCITONIN,  WBC,  LACTICIDVEN Microbiology No results found for this or any previous visit (from the past 240 hour(s)).  Time coordinating discharge: 34 minutes  SIGNED:  Irwin Brakeman, MD  [facility] 12/30/2017, 12:03 PM Pager [phone]  If 7PM-7AM, please contact night-coverage [email or web address] Password [email or web address]

## 2017-12-30 NOTE — Progress Notes (Signed)
Removed IV-clean, dry, intact. Tried to review d/c paperwork with patient. He wasn't listening. I told him where he could pick up his medications. He said that pharmacy listed is by his son's house, but he would get it. Robin wheeled stable pt to main entrance where he was picked up by daughter.

## 2017-12-30 NOTE — Care Management Note (Addendum)
Case Management Note  Patient Details  Name: Andrew [name] MRN: [id number] Date of Birth: 30-Oct-1940  Subjective/Objective:                 Spoke w patient, he is unaware if oxygen has been resolved through [place]. No documentation found to support if it has been or not. Therefore referral placed to [facility] to supply oxygen for planned DC to home today. Referral accepted by Burnis Kingfisher [facility]. Portable tank for transportation to home will be delivered to room today, cannot guarantee exact time but will be prior to 5pm. Patient states that his daughter will provide transportation. Faxed Eliquis coupon to [facility] it was e Prescribed to. Faxed DC note to [place]. No other CM needs identified.    Action/Plan:   Expected Discharge Date:  12/30/17               Expected Discharge Plan:  Home/Self Care  In-House Referral:  NA  Discharge planning Services  CM Consult  Post Acute Care Choice:  Durable Medical Equipment Choice offered to:  NA  DME Arranged:  Oxygen DME Agency:  [place]:    [facility] Agency:     Status of Service:  Completed, signed off  If discussed at [place] of Stay Meetings, dates discussed:    Additional Comments:  Carles Collet, RN 12/30/2017, 10:26 AM

## 2017-12-30 NOTE — Plan of Care (Signed)
Progressing

## 2017-12-30 NOTE — Progress Notes (Signed)
Plans are for him to be discharged home today.  He has overall poor prognosis because of his severe COPD chronic respiratory failure chronic diastolic heart failure and ongoing smoking and chronic aspiration.  He has not been compliant with thickening his liquids.  I will be glad to see him in my office for pulmonary disease but he may wish to go to pulmonary at the [place].  I will sign off.  Thanks for allowing me to see him with you

## 2018-01-01 LAB — GLUCOSE, CAPILLARY: GLUCOSE-CAPILLARY: 356 mg/dL — AB (ref 65–99)

## 2018-01-01 NOTE — Care Management (Signed)
CM received call from Drue Stager, SW at [facility]. Pt was DC'd over weekend, home O2 set up through [facility] using his [employer]. Pt may switch over to having [facility] it. Lajuana Matte will forward info to oxygen coordinator.

## 2018-02-03 ENCOUNTER — Inpatient Hospital Stay (HOSPITAL_COMMUNITY)
Admission: EM | Admit: 2018-02-03 | Discharge: 2018-02-07 | DRG: 377 | Disposition: A | Discharge status: Home Health | Payer: Medicare PPO | Attending: Family Medicine | Admitting: Family Medicine

## 2018-02-03 ENCOUNTER — Emergency Department (HOSPITAL_COMMUNITY): Payer: Medicare PPO

## 2018-02-03 ENCOUNTER — Other Ambulatory Visit: Payer: Self-pay

## 2018-02-03 ENCOUNTER — Encounter (HOSPITAL_COMMUNITY): Payer: Self-pay | Admitting: Emergency Medicine

## 2018-02-03 DIAGNOSIS — Z9981 Dependence on supplemental oxygen: Secondary | ICD-10-CM | POA: Diagnosis not present

## 2018-02-03 DIAGNOSIS — Z7901 Long term (current) use of anticoagulants: Secondary | ICD-10-CM | POA: Diagnosis not present

## 2018-02-03 DIAGNOSIS — Z8249 Family history of ischemic heart disease and other diseases of the circulatory system: Secondary | ICD-10-CM | POA: Diagnosis not present

## 2018-02-03 DIAGNOSIS — I5033 Acute on chronic diastolic (congestive) heart failure: Secondary | ICD-10-CM | POA: Diagnosis not present

## 2018-02-03 DIAGNOSIS — E1165 Type 2 diabetes mellitus with hyperglycemia: Secondary | ICD-10-CM | POA: Diagnosis not present

## 2018-02-03 DIAGNOSIS — Z833 Family history of diabetes mellitus: Secondary | ICD-10-CM

## 2018-02-03 DIAGNOSIS — E119 Type 2 diabetes mellitus without complications: Secondary | ICD-10-CM | POA: Diagnosis not present

## 2018-02-03 DIAGNOSIS — I110 Hypertensive heart disease with heart failure: Secondary | ICD-10-CM | POA: Diagnosis present

## 2018-02-03 DIAGNOSIS — D649 Anemia, unspecified: Secondary | ICD-10-CM

## 2018-02-03 DIAGNOSIS — J9620 Acute and chronic respiratory failure, unspecified whether with hypoxia or hypercapnia: Secondary | ICD-10-CM | POA: Diagnosis present

## 2018-02-03 DIAGNOSIS — I480 Paroxysmal atrial fibrillation: Secondary | ICD-10-CM | POA: Diagnosis not present

## 2018-02-03 DIAGNOSIS — K5731 Diverticulosis of large intestine without perforation or abscess with bleeding: Secondary | ICD-10-CM | POA: Diagnosis present

## 2018-02-03 DIAGNOSIS — K5730 Diverticulosis of large intestine without perforation or abscess without bleeding: Secondary | ICD-10-CM | POA: Diagnosis present

## 2018-02-03 DIAGNOSIS — K922 Gastrointestinal hemorrhage, unspecified: Secondary | ICD-10-CM | POA: Diagnosis present

## 2018-02-03 DIAGNOSIS — D62 Acute posthemorrhagic anemia: Secondary | ICD-10-CM | POA: Diagnosis not present

## 2018-02-03 DIAGNOSIS — I4891 Unspecified atrial fibrillation: Secondary | ICD-10-CM | POA: Diagnosis not present

## 2018-02-03 DIAGNOSIS — J441 Chronic obstructive pulmonary disease with (acute) exacerbation: Secondary | ICD-10-CM | POA: Diagnosis not present

## 2018-02-03 DIAGNOSIS — I2510 Atherosclerotic heart disease of native coronary artery without angina pectoris: Secondary | ICD-10-CM | POA: Diagnosis present

## 2018-02-03 DIAGNOSIS — E785 Hyperlipidemia, unspecified: Secondary | ICD-10-CM | POA: Diagnosis present

## 2018-02-03 DIAGNOSIS — Z8673 Personal history of transient ischemic attack (TIA), and cerebral infarction without residual deficits: Secondary | ICD-10-CM | POA: Diagnosis not present

## 2018-02-03 DIAGNOSIS — K625 Hemorrhage of anus and rectum: Secondary | ICD-10-CM | POA: Diagnosis present

## 2018-02-03 DIAGNOSIS — Z794 Long term (current) use of insulin: Secondary | ICD-10-CM | POA: Diagnosis not present

## 2018-02-03 DIAGNOSIS — Z825 Family history of asthma and other chronic lower respiratory diseases: Secondary | ICD-10-CM

## 2018-02-03 DIAGNOSIS — Z7982 Long term (current) use of aspirin: Secondary | ICD-10-CM | POA: Diagnosis not present

## 2018-02-03 DIAGNOSIS — Z87442 Personal history of urinary calculi: Secondary | ICD-10-CM

## 2018-02-03 DIAGNOSIS — Z87891 Personal history of nicotine dependence: Secondary | ICD-10-CM | POA: Diagnosis not present

## 2018-02-03 DIAGNOSIS — R1310 Dysphagia, unspecified: Secondary | ICD-10-CM | POA: Diagnosis present

## 2018-02-03 LAB — APTT: aPTT: 27 seconds (ref 24–36)

## 2018-02-03 LAB — COMPREHENSIVE METABOLIC PANEL
ALT: 15 U/L — ABNORMAL LOW (ref 17–63)
AST: 18 U/L (ref 15–41)
Albumin: 2.4 g/dL — ABNORMAL LOW (ref 3.5–5.0)
Alkaline Phosphatase: 41 U/L (ref 38–126)
Anion gap: 9 (ref 5–15)
BUN: 41 mg/dL — ABNORMAL HIGH (ref 6–20)
CO2: 38 mmol/L — ABNORMAL HIGH (ref 22–32)
Calcium: 7.9 mg/dL — ABNORMAL LOW (ref 8.9–10.3)
Chloride: 92 mmol/L — ABNORMAL LOW (ref 101–111)
Creatinine, Ser: 1.06 mg/dL (ref 0.61–1.24)
GFR calc Af Amer: >60 mL/min (ref >60)
GFR calc non Af Amer: >60 mL/min (ref >60)
Glucose, Bld: 324 mg/dL — ABNORMAL HIGH (ref 65–99)
Potassium: 4.2 mmol/L (ref 3.5–5.1)
Sodium: 139 mmol/L (ref 135–145)
Total Bilirubin: 0.4 mg/dL (ref 0.3–1.2)
Total Protein: 4.6 g/dL — ABNORMAL LOW (ref 6.5–8.1)

## 2018-02-03 LAB — CBC
HCT: 26.7 % — ABNORMAL LOW (ref 39.0–52.0)
Hemoglobin: 8.4 g/dL — ABNORMAL LOW (ref 13.0–17.0)
MCH: 26.3 pg (ref 26.0–34.0)
MCHC: 31.5 g/dL (ref 30.0–36.0)
MCV: 83.4 fL (ref 78.0–100.0)
Platelets: 226 10*3/uL (ref 150–400)
RBC: 3.2 MIL/uL — ABNORMAL LOW (ref 4.22–5.81)
RDW: 17.2 % — ABNORMAL HIGH (ref 11.5–15.5)
WBC: 13.9 10*3/uL — ABNORMAL HIGH (ref 4.0–10.5)

## 2018-02-03 LAB — CBG MONITORING, ED: GLUCOSE-CAPILLARY: 321 mg/dL — AB (ref 65–99)

## 2018-02-03 LAB — TYPE AND SCREEN
ABO/RH(D): A POS
Antibody Screen: NEGATIVE

## 2018-02-03 LAB — PROTIME-INR
INR: 1.49
Prothrombin Time: 17.9 seconds — ABNORMAL HIGH (ref 11.4–15.2)

## 2018-02-03 LAB — HEPARIN LEVEL (UNFRACTIONATED): Heparin Unfractionated: >2.2 IU/mL — ABNORMAL HIGH (ref 0.30–0.70)

## 2018-02-03 MED ORDER — PROTHROMBIN COMPLEX CONC HUMAN 500 UNITS IV KIT
3000.0000 [IU] | PACK | Status: AC
  Administered 2018-02-04: 3000 [IU] via INTRAVENOUS
  Filled 2018-02-03: qty 3000

## 2018-02-03 NOTE — ED Triage Notes (Signed)
Discharged from [facility] 2 days ago Now with Rectal bleed since yesterday

## 2018-02-03 NOTE — ED Notes (Signed)
Dr Wilson Singer in to discuss the need to transfer pt to [facility]

## 2018-02-03 NOTE — H&P (Signed)
TRH H&P    Patient Demographics:    Andrew [name], is a 77 y.o. male  MRN: [id number]  DOB - 06-06-1941  Admit Date - 02/03/2018  Referring MD/NP/PA: Dr. Wilson Singer  Outpatient Primary MD for the patient is [facility]  Patient coming from: Home  Chief complaint-rectal bleeding   HPI:    Andrew [name]  is a 77 y.o. male, with history of COPD, chronic respiratory failure on chronic home O2 4 L/min, chronic diastolic CHF, dysphagia with recurrent aspiration, atrial fibrillation on anticoagulation with Eliquis was brought to the hospital after patient developed rectal bleeding yesterday.  Patient says that he was having loose bowel movements and was passing blood.  Patient is a poor historian and unable to provide further details.  He also complains of shortness of breath. He denies chest pain, denies passing out. Denies abdominal pain.  In the ED patient was found to have hemoglobin of 8.4, gross blood per rectum.  He has a history of diverticulosis Had a colonoscopy in 2008 which showed diverticulosis in descending colon.    Review of systems:    .  As in the HPI, rest of the review of systems unobtainable as patient is poor historian.   With Past History of the following :    Past Medical History:  Diagnosis Date  . A-fib ([place])   . Abdominal pain   . Adenomatous colon polyp   . Arthritis   . Asthma   . CAD (coronary artery disease)   . Cancer ([place])    skin, nose  . Cataract    bilateral  . CHF (congestive heart failure) ([place])    patient reports  . Chronic LBP   . COPD (chronic obstructive pulmonary disease) ([place])   . Decreased libido   . Diverticulosis of colon   . Esophageal stricture   . Fatigue   . GERD (gastroesophageal reflux disease)   . History of small bowel obstruction   . Hyperlipidemia   . Hypertension   . Hypertrophy of prostate with urinary obstruction and other lower  urinary tract symptoms (LUTS)   . Palpitations   . Peri-rectal abscess   . Personal history of renal calculi   . Prostatitis, acute   . Stroke [facility])    pt reports  . Tobacco abuse       Past Surgical History:  Procedure Laterality Date  . CATARACT EXTRACTION  04/2010   bilateral  . HERNIA REPAIR    . LUMBAR LAMINECTOMY     with Harrington rods  . PENILE PROSTHESIS PLACEMENT    . TONSILLECTOMY        Social History:      Social History   Tobacco Use  . Smoking status: Former Smoker    Packs/day: 1.00    Types: Cigarettes    Last attempt to quit: 01/18/2018    Years since quitting: 0.0  . Smokeless tobacco: Never Used  Substance Use Topics  . Alcohol use: No       Family History :  Family History  Problem Relation Age of Onset  . Heart disease Brother        CAD  . Diabetes Brother   . COPD Brother   . Emphysema Brother       Home Medications:   Prior to Admission medications   Medication Sig Start Date End Date Taking? Authorizing Provider  docusate sodium (COLACE) 100 MG capsule Take 100 mg by mouth daily.   Yes [provider]  acetaminophen (TYLENOL) 650 MG CR tablet Take 650 mg by mouth every 8 (eight) hours as needed for pain.    [provider]  albuterol (PROVENTIL) (2.5 MG/3ML) 0.083% nebulizer solution Take 3 mLs (2.5 mg total) by nebulization every 4 (four) hours as needed for wheezing or shortness of breath. 07/03/17   Arrien, Jimmy Picket, MD  ALPRAZolam Duanne Moron) 0.25 MG tablet Take 1 tablet (0.25 mg total) by mouth 3 (three) times daily as needed for anxiety. 07/03/17   Arrien, Jimmy Picket, MD  apixaban (ELIQUIS) 5 MG TABS tablet Take 1 tablet (5 mg total) by mouth 2 (two) times daily. 12/30/17 01/29/18  Murlean Iba, MD  aspirin 325 MG EC tablet Take 325 mg by mouth daily.    [provider]  cyclobenzaprine (FLEXERIL) 10 MG tablet Take 5 mg by mouth at bedtime.    [provider]  diltiazem  (TIAZAC) 300 MG 24 hr capsule Take 1 capsule (300 mg total) by mouth daily. 07/03/17 12/27/17  Arrien, Jimmy Picket, MD  diphenhydrAMINE (BENADRYL) 25 MG tablet Take 1 tablet by mouth at bedtime.    [provider]  Fluticasone-Salmeterol (ADVAIR DISKUS) 500-50 MCG/DOSE AEPB Inhale 1 puff into the lungs 2 (two) times daily. 07/03/17 12/27/17  Arrien, Jimmy Picket, MD  furosemide (LASIX) 40 MG tablet Take 1 tablet (40 mg total) by mouth daily. 12/31/17   Johnson, Clanford L, MD  glipiZIDE (GLUCOTROL XL) 5 MG 24 hr tablet Take 1 tablet (5 mg total) by mouth daily. 12/30/17   Johnson, Clanford L, MD  ibuprofen (ADVIL,MOTRIN) 200 MG tablet Take 1 tablet (200 mg total) by mouth every 8 (eight) hours as needed (back pain). 07/03/17   Arrien, Jimmy Picket, MD  insulin aspart (NOVOLOG) 100 UNIT/ML FlexPen Check CBG's there etimes daily before meals. For CBG less than 150: 0 Units For CBG 150-200: 2 Units SQ For CBG 200-250: 4 Units SQ For CBG 250-300: 6 Units SQ For CBG 300-350: 8 Units SQ and call PCP for further instructions 07/03/17   Arrien, Jimmy Picket, MD  insulin glargine (LANTUS) 100 unit/mL SOPN Inject 0.25 mLs (25 Units total) into the skin daily. 12/30/17 01/29/18  Johnson, Clanford L, MD  losartan (COZAAR) 25 MG tablet Take 1 tablet (25 mg total) by mouth daily. 12/31/17 01/30/18  Johnson, Clanford L, MD  Melatonin 3 MG TABS Take 1 tablet by mouth at bedtime.    [provider]  metoCLOPramide (REGLAN) 10 MG tablet Take 10 mg by mouth every 6 (six) hours as needed for nausea.    [provider]  montelukast (SINGULAIR) 10 MG tablet Take 10 mg by mouth at bedtime.    [provider]  omeprazole (PRILOSEC) 40 MG capsule Take 40 mg by mouth daily.    [provider]  potassium chloride SA (K-DUR,KLOR-CON) 20 MEQ tablet Take 1 tablet (20 mEq total) by mouth daily for 5 doses. 12/31/17 01/05/18  Johnson, Clanford L, MD  predniSONE (DELTASONE) 10 MG  tablet Take 4 tabs daily with breakfast  x 5 days, then 3 tabs daily x 5 days, then 2 tabs daily x 5 days, then resume 1 tab daily 12/30/17   Wynetta Emery, Clanford L, MD  rosuvastatin (CRESTOR) 20 MG tablet Take 1 tablet (20 mg total) by mouth daily. 07/03/17   Arrien, Jimmy Picket, MD  tiotropium (SPIRIVA HANDIHALER) 18 MCG inhalation capsule Place 1 capsule (18 mcg total) into inhaler and inhale daily. 07/03/17 07/03/18  Arrien, Jimmy Picket, MD  tiZANidine (ZANAFLEX) 4 MG tablet Take 4 mg by mouth 3 (three) times daily.    [provider]  vitamin B-12 (CYANOCOBALAMIN) 500 MCG tablet Take 500 mcg by mouth 2 (two) times daily.    [provider]     Allergies:     Allergies  Allergen Reactions  . Procaine Other (See Comments) and Shortness Of Breath    Syncope  . Procaine Hcl Anaphylaxis and Swelling     Physical Exam:   Vitals  Blood pressure (!) 166/84, pulse 99, resp. rate (!) 28, height 5\' 5"  (1.651 m), weight 79.4 kg (175 lb), SpO2 98 %.  1.  General: Appears in no acute distress  2. Psychiatric:  Intact judgement and  insight, awake alert, oriented x 3.  3. Neurologic: No focal neurological deficits, all cranial nerves intact.Strength 5/5 all 4 extremities, sensation intact all 4 extremities, plantars down going.  4. Eyes :  anicteric sclerae, moist conjunctivae with no lid lag. PERRLA.  5. ENMT:  Oropharynx clear with moist mucous membranes and good dentition  6. Neck:  supple, no cervical lymphadenopathy appriciated, No thyromegaly  7. Respiratory : Normal respiratory effort, good air movement bilaterally,clear to  auscultation bilaterally  8. Cardiovascular : RRR, no gallops, rubs or murmurs, no leg edema  9. Gastrointestinal:  Positive bowel sounds, abdomen soft, non-tender to palpation,no hepatosplenomegaly, no rigidity or guarding       10. Skin:  No cyanosis, normal texture and turgor, no rash, lesions or  ulcers  11.Musculoskeletal:  Good muscle tone,  joints appear normal , no effusions,  normal range of motion    Data Review:    CBC Recent Labs  Lab 02/03/18 2158  WBC 13.9*  HGB 8.4*  HCT 26.7*  PLT 226  MCV 83.4  MCH 26.3  MCHC 31.5  RDW 17.2*   ------------------------------------------------------------------------------------------------------------------  Chemistries  Recent Labs  Lab 02/03/18 2158  NA 139  K 4.2  CL 92*  CO2 38*  GLUCOSE 324*  BUN 41*  CREATININE 1.06  CALCIUM 7.9*  AST 18  ALT 15*  ALKPHOS 41  BILITOT 0.4   ------------------------------------------------------------------------------------------------------------------  ------------------------------------------------------------------------------------------------------------------ GFR: Estimated Creatinine Clearance: 54.9 mL/min (by C-G formula based on SCr of 1.06 mg/dL). Liver Function Tests: Recent Labs  Lab 02/03/18 2158  AST 18  ALT 15*  ALKPHOS 41  BILITOT 0.4  PROT 4.6*  ALBUMIN 2.4*   No results for input(s): LIPASE, AMYLASE in the last 168 hours. No results for input(s): AMMONIA in the last 168 hours. Coagulation Profile: Recent Labs  Lab 02/03/18 2159  INR 1.49   Cardiac Enzymes: No results for input(s): CKTOTAL, CKMB, CKMBINDEX, TROPONINI in the last 168 hours. BNP (last 3 results) No results for input(s): PROBNP in the last 8760 hours. HbA1C: No results for input(s): HGBA1C in the last 72 hours. CBG: Recent Labs  Lab 02/03/18 2153  GLUCAP 321*   Lipid Profile: No results for input(s): CHOL, HDL, LDLCALC, TRIG, CHOLHDL, LDLDIRECT in the last 72 hours. Thyroid Function Tests: No results for  input(s): TSH, T4TOTAL, FREET4, T3FREE, THYROIDAB in the last 72 hours. Anemia Panel: No results for input(s): VITAMINB12, FOLATE, FERRITIN, TIBC, IRON, RETICCTPCT in the last 72 hours.     Imaging Results:    Dg Chest Portable 1 View  Result Date:  02/03/2018 CLINICAL DATA:  GI bleed, shortness of breath. Recent hospitalization. EXAM: PORTABLE CHEST 1 VIEW COMPARISON:  Chest radiograph Jan 24, 2018 FINDINGS: Cardiomediastinal silhouette is normal. Calcified aortic arch. Persistent LEFT lung base patchy airspace opacity. No pleural effusion. No pneumothorax. Soft tissue planes and included osseous structures are nonsuspicious. IMPRESSION: Persistent LEFT lung base atelectasis, scarring or pneumonia. Aortic Atherosclerosis (ICD10-I70.0). Electronically Signed   By: Elon Alas M.D.   On: 02/03/2018 22:35    My personal review of EKG: Rhythm NSR   Assessment & Plan:    Active Problems:   Diabetes mellitus ([place])   ATRIAL FIBRILLATION   Diverticulosis of colon   COPD exacerbation ([facility])   GI bleed   1. GI bleed-likely diverticular bleed, patient's blood pressure is stable at this time.  He will be transferred to [facility] for [facility] evaluation.  ED physician is going to consult GI at [place] blood loss anemia from GI bleed.  Hemoglobin is 8.4, previous hemoglobin was 13.8 on December 26, 2017.  Type and screen has been obtained.  Transfuse PRBC for hemoglobin less than 7.  Will check H&H every 6 hours. 3. Diabetes mellitus-hold oral hypoglycemic agents, will start sliding scale insulin with NovoLog.  Check CBG every 6 hours. 4. Acute on chronic respiratory failure-patient complained of shortness of breath he is on 4 L of oxygen at home.  He takes prednisone daily.  Will start Solu-Cortef 50 mg every 8 hours. 5. Atrial fibrillation-heart rate is controlled, hold p.o. medications.  Patient is on Eliquis for anticoagulation which has been reversed.  Continue to monitor closely in stepdown unit. 6. Chronic anticoagulation-patient is on chronic anticoagulation for atrial fibrillation as above.  Eliquis has been reversed with Kcentra per pharmacy.  Pharmacy to follow INR.    DVT Prophylaxis-   SCD's  AM Labs  Ordered, also please review Full Orders  Family Communication: Admission, patients condition and plan of care including tests being ordered have been discussed with the patient  who indicate understanding and agree with the plan and Code Status.  Code Status: Full code  Admission status: Inpatient  Time spent in minutes : 60 minutes   Oswald Hillock M.D on 02/03/2018 at 11:53 PM  Between 7am to 7pm - Pager - [phone]. After 7pm go to [email or web address] - password [facility]  [facility] - Office  [phone]

## 2018-02-03 NOTE — ED Provider Notes (Addendum)
[facility] EMERGENCY DEPARTMENT Provider Note   CSN: [id number] Arrival date & time: 02/03/18  2143     History   Chief Complaint Chief Complaint  Patient presents with  . Rectal Bleeding  . Shortness of Breath    HPI INA POUPARD is a 77 y.o. male.  HPI   77 year old male brought in by EMS for rectal bleeding.  Apparently this has been going on since yesterday.  Bright red blood.  Family called EMS today bcc Ronalee Belts is having some increased work of breathing.  He does have chronic respiratory failure and is on 4 L of oxygen at baseline.  He was on CPAP when EMS arrived.  He usually uses this is at night.  He is a poor historian.  He does be feel short of breath but really has no other complaints otherwise.  He denies any acute pain.  Past history of atrial fibrillation on Eliquis.  She reports that initial blood pressure was 90s over 40s.  He reports that he was just discharged from our hospital 2 days ago for his "breathing."  He is unsure of specifics beyond this.  Past Medical History:  Diagnosis Date  . A-fib ([place])   . Abdominal pain   . Adenomatous colon polyp   . Arthritis   . Asthma   . CAD (coronary artery disease)   . Cancer ([place])    skin, nose  . Cataract    bilateral  . CHF (congestive heart failure) ([place])    patient reports  . Chronic LBP   . COPD (chronic obstructive pulmonary disease) ([place])   . Decreased libido   . Diverticulosis of colon   . Esophageal stricture   . Fatigue   . GERD (gastroesophageal reflux disease)   . History of small bowel obstruction   . Hyperlipidemia   . Hypertension   . Hypertrophy of prostate with urinary obstruction and other lower urinary tract symptoms (LUTS)   . Palpitations   . Peri-rectal abscess   . Personal history of renal calculi   . Prostatitis, acute   . Stroke [facility])    pt reports  . Tobacco abuse     Patient Active Problem List   Diagnosis Date Noted  . Acute respiratory failure ([place]) 12/26/2017  .  Aspiration pneumonia ([place]) 12/26/2017  . Dysphagia 12/26/2017  . Acute on chronic diastolic CHF (congestive heart failure) ([place]) 12/26/2017  . Acute on chronic respiratory failure with hypoxia ([place]) 11/17/2017  . COPD with exacerbation ([place]) 11/17/2017  . Hospital acquired PNA 05/14/2017  . COPD exacerbation ([place])   . HCAP (healthcare-associated pneumonia)   . Acute respiratory failure with hypoxia and hypercapnia ([place]) 04/12/2017  . Pneumonia 10/24/2016  . AAA (abdominal aortic aneurysm) ([place]) 01/04/2011  . Dizziness, nonspecific 12/26/2010  . UNSPECIFIED PERIPHERAL VASCULAR DISEASE 09/23/2010  . LEG CRAMPS 09/13/2010  . Diabetes mellitus ([place]) 02/11/2010  . CHRONIC OBSTRUCTIVE PULMONARY DISEASE, ACUTE EXACERBATION 02/11/2010  . OLECRANON BURSITIS, RIGHT 01/26/2010  . PENILE PAIN 12/29/2009  . ABNORMAL EJACULATION 05/05/2009  . LARYNGITIS, ACUTE 04/21/2009  . ATRIAL FIBRILLATION 04/08/2009  . PALPITATIONS 04/08/2009  . FATIGUE 03/31/2009  . LIBIDO, DECREASED 03/31/2009  . HYPERTROPHY PROSTATE W/UR OBST & OTH LUTS 03/25/2009  . ACUTE PROSTATITIS 03/25/2009  . SMALL BOWEL OBSTRUCTION, HX OF 11/28/2007  . HLD (hyperlipidemia) 11/13/2007  . COPD 10/02/2007  . LOW BACK PAIN, CHRONIC 10/02/2007  . ADENOMATOUS COLONIC POLYP 05/22/2007  . ESOPHAGEAL STRICTURE 05/22/2007  . DIVERTICULOSIS, COLON 05/22/2007  .  TOBACCO ABUSE 05/08/2007  . Essential hypertension 05/08/2007  . CORONARY ARTERY DISEASE 05/08/2007  . ASTHMA 05/08/2007  . GERD 05/08/2007  . RENAL CALCULUS, HX OF 05/08/2007  . ABSCESS, PERIRECTAL, HX OF 05/08/2007  . ARTHRITIS, HX OF 05/08/2007  . PENILE PROSTHESIS 05/08/2007  . LAMINECTOMY, LUMBAR, HX OF 05/08/2007    Past Surgical History:  Procedure Laterality Date  . CATARACT EXTRACTION  04/2010   bilateral  . HERNIA REPAIR    . LUMBAR LAMINECTOMY     with Harrington rods  . PENILE PROSTHESIS PLACEMENT    . TONSILLECTOMY          Home Medications     Prior to Admission medications   Medication Sig Start Date End Date Taking? Authorizing Provider  acetaminophen (TYLENOL) 650 MG CR tablet Take 650 mg by mouth every 8 (eight) hours as needed for pain.    [provider]  albuterol (PROVENTIL) (2.5 MG/3ML) 0.083% nebulizer solution Take 3 mLs (2.5 mg total) by nebulization every 4 (four) hours as needed for wheezing or shortness of breath. 07/03/17   Arrien, Jimmy Picket, MD  ALPRAZolam Duanne Moron) 0.25 MG tablet Take 1 tablet (0.25 mg total) by mouth 3 (three) times daily as needed for anxiety. 07/03/17   Arrien, Jimmy Picket, MD  apixaban (ELIQUIS) 5 MG TABS tablet Take 1 tablet (5 mg total) by mouth 2 (two) times daily. 12/30/17 01/29/18  Murlean Iba, MD  aspirin 325 MG EC tablet Take 325 mg by mouth daily.    [provider]  cyclobenzaprine (FLEXERIL) 10 MG tablet Take 5 mg by mouth at bedtime.    [provider]  diltiazem (TIAZAC) 300 MG 24 hr capsule Take 1 capsule (300 mg total) by mouth daily. 07/03/17 12/27/17  Arrien, Jimmy Picket, MD  diphenhydrAMINE (BENADRYL) 25 MG tablet Take 1 tablet by mouth at bedtime.    [provider]  Fluticasone-Salmeterol (ADVAIR DISKUS) 500-50 MCG/DOSE AEPB Inhale 1 puff into the lungs 2 (two) times daily. 07/03/17 12/27/17  Arrien, Jimmy Picket, MD  furosemide (LASIX) 40 MG tablet Take 1 tablet (40 mg total) by mouth daily. 12/31/17   Johnson, Clanford L, MD  glipiZIDE (GLUCOTROL XL) 5 MG 24 hr tablet Take 1 tablet (5 mg total) by mouth daily. 12/30/17   Johnson, Clanford L, MD  ibuprofen (ADVIL,MOTRIN) 200 MG tablet Take 1 tablet (200 mg total) by mouth every 8 (eight) hours as needed (back pain). 07/03/17   Arrien, Jimmy Picket, MD  insulin aspart (NOVOLOG) 100 UNIT/ML FlexPen Check CBG's there etimes daily before meals. For CBG less than 150: 0 Units For CBG 150-200: 2 Units SQ For CBG 200-250: 4 Units SQ For CBG 250-300: 6 Units SQ For CBG 300-350:  8 Units SQ and call PCP for further instructions 07/03/17   Arrien, Jimmy Picket, MD  insulin glargine (LANTUS) 100 unit/mL SOPN Inject 0.25 mLs (25 Units total) into the skin daily. 12/30/17 01/29/18  Johnson, Clanford L, MD  losartan (COZAAR) 25 MG tablet Take 1 tablet (25 mg total) by mouth daily. 12/31/17 01/30/18  Johnson, Clanford L, MD  Melatonin 3 MG TABS Take 1 tablet by mouth at bedtime.    [provider]  metoCLOPramide (REGLAN) 10 MG tablet Take 10 mg by mouth every 6 (six) hours as needed for nausea.    [provider]  montelukast (SINGULAIR) 10 MG tablet Take 10 mg by mouth at bedtime.    [provider]  omeprazole (PRILOSEC) 40 MG capsule Take  40 mg by mouth daily.    [provider]  potassium chloride SA (K-DUR,KLOR-CON) 20 MEQ tablet Take 1 tablet (20 mEq total) by mouth daily for 5 doses. 12/31/17 01/05/18  Johnson, Clanford L, MD  predniSONE (DELTASONE) 10 MG tablet Take 4 tabs daily with breakfast x 5 days, then 3 tabs daily x 5 days, then 2 tabs daily x 5 days, then resume 1 tab daily 12/30/17   Wynetta Emery, Clanford L, MD  rosuvastatin (CRESTOR) 20 MG tablet Take 1 tablet (20 mg total) by mouth daily. 07/03/17   Arrien, Jimmy Picket, MD  tiotropium (SPIRIVA HANDIHALER) 18 MCG inhalation capsule Place 1 capsule (18 mcg total) into inhaler and inhale daily. 07/03/17 07/03/18  Arrien, Jimmy Picket, MD  tiZANidine (ZANAFLEX) 4 MG tablet Take 4 mg by mouth 3 (three) times daily.    [provider]  vitamin B-12 (CYANOCOBALAMIN) 500 MCG tablet Take 500 mcg by mouth 2 (two) times daily.    [provider]    Family History Family History  Problem Relation Age of Onset  . Heart disease Brother        CAD  . Diabetes Brother   . COPD Brother   . Emphysema Brother     Social History Social History   Tobacco Use  . Smoking status: Former Smoker    Packs/day: 1.00    Types: Cigarettes    Last attempt to quit: 01/18/2018     Years since quitting: 0.0  . Smokeless tobacco: Never Used  Substance Use Topics  . Alcohol use: No  . Drug use: No     Allergies   Procaine and Procaine hcl   Review of Systems Review of Systems  All systems reviewed and negative, other than as noted in HPI.  Physical Exam Updated Vital Signs Ht 5\' 5"  (1.651 m)   Wt 79.4 kg (175 lb)   BMI 29.12 kg/m   Physical Exam  Constitutional: He appears well-developed and well-nourished. No distress.  HENT:  Head: Normocephalic and atraumatic.  Eyes: Conjunctivae are normal. Right eye exhibits no discharge. Left eye exhibits no discharge.  Neck: Neck supple.  Cardiovascular: Normal rate, regular rhythm and normal heart sounds. Exam reveals no gallop and no friction rub.  No murmur heard. Pulmonary/Chest: Effort normal and breath sounds normal. No respiratory distress.  Tachypnea.  Some accessory muscle usage.  Speaks in short phrases.  Abdominal: Soft. He exhibits no distension. There is no tenderness.  Soft.  Mild distention.  Easily reducible umbilical hernia.  No tenderness.  Genitourinary:  Genitourinary Comments: Gross blood per rectum  Musculoskeletal: He exhibits no edema or tenderness.  Neurological: He is alert.  Skin: Skin is warm and dry.  Psychiatric: He has a normal mood and affect. His behavior is normal. Thought content normal.  Nursing note and vitals reviewed.    ED Treatments / Results  Labs (all labs ordered are listed, but only abnormal results are displayed) Labs Reviewed  COMPREHENSIVE METABOLIC PANEL - Abnormal; Notable for the following components:      Result Value   Chloride 92 (*)    CO2 38 (*)    Glucose, Bld 324 (*)    BUN 41 (*)    Calcium 7.9 (*)    Total Protein 4.6 (*)    Albumin 2.4 (*)    ALT 15 (*)    All other components within normal limits  CBC - Abnormal; Notable for the following components:   WBC 13.9 (*)  RBC 3.20 (*)    Hemoglobin 8.4 (*)    HCT 26.7 (*)    RDW  17.2 (*)    All other components within normal limits  PROTIME-INR - Abnormal; Notable for the following components:   Prothrombin Time 17.9 (*)    All other components within normal limits  CBG MONITORING, ED - Abnormal; Notable for the following components:   Glucose-Capillary 321 (*)    All other components within normal limits  APTT  HEPARIN LEVEL (UNFRACTIONATED)  POC OCCULT BLOOD, ED  TYPE AND SCREEN    EKG EKG Interpretation  Date/Time:  Saturday February 03 2018 21:46:56 EDT Ventricular Rate:  86 PR Interval:    QRS Duration: 111 QT Interval:  423 QTC Calculation: 430 R Axis:   91 Text Interpretation:  Sinus rhythm Atrial premature complexes Right axis deviation Baseline wander in lead(s) II III aVR aVL aVF V6 Poor data quality Confirmed by Virgel Manifold [phone]) on 02/03/2018 10:01:38 PM   Radiology Dg Chest Portable 1 View  Result Date: 02/03/2018 CLINICAL DATA:  GI bleed, shortness of breath. Recent hospitalization. EXAM: PORTABLE CHEST 1 VIEW COMPARISON:  Chest radiograph Jan 24, 2018 FINDINGS: Cardiomediastinal silhouette is normal. Calcified aortic arch. Persistent LEFT lung base patchy airspace opacity. No pleural effusion. No pneumothorax. Soft tissue planes and included osseous structures are nonsuspicious. IMPRESSION: Persistent LEFT lung base atelectasis, scarring or pneumonia. Aortic Atherosclerosis (ICD10-I70.0). Electronically Signed   By: Elon Alas M.D.   On: 02/03/2018 22:35    Procedures Procedures (including critical care time)  CRITICAL CARE Performed by: Virgel Manifold Total critical care time: 35 minutes Critical care time was exclusive of separately billable procedures and treating other patients. Critical care was necessary to treat or prevent imminent or life-threatening deterioration. Critical care was time spent personally by me on the following activities: development of treatment plan with patient and/or surrogate as well as nursing,  discussions with consultants, evaluation of patient's response to treatment, examination of patient, obtaining history from patient or surrogate, ordering and performing treatments and interventions, ordering and review of laboratory studies, ordering and review of radiographic studies, pulse oximetry and re-evaluation of patient's condition.   Medications Ordered in ED Medications - No data to display   Initial Impression / Assessment and Plan / ED Course  I have reviewed the triage vital signs and the nursing notes.  Pertinent labs & imaging results that were available during my care of the patient were reviewed by me and considered in my medical decision making (see chart for details).  Clinical Course as of Feb 07 1357  Sat Feb 03, 2018  2258 Discussed with pharmacy. Recommending kcentra or andexxa.    [SK]  2321 Discussed with Dr Darrick Meigs. Appreciate hospitalist service admitting patient. No local GI coverage. Will have to transfer. GI paged. Discussed with Dr Tyrone Nine, EDP at [facility] for ED to ED transfer is no step-down beds available. More appropriate for this patient to be in ED at [facility] pending a bed. (symptomatic anemia, hemoglobin from 13.8 five weeks ago to 8.4, hypotensive, on eliquis, no GI available).    [SK]    Clinical Course User Index [SK] Virgel Manifold, MD    77 year old male with rectal bleeding.  His only complaint to me as dyspnea.  Suspect dyspnea secondary to blood loss.  He is on Eliquis.  He has 2 IVs.  Blood pressure currently around 785 systolic.  Type and screen.  He will need admission.  He will ultimately be transfered  as we do not have GI coverage over the weekend here and [facility].  Final Clinical Impressions(s) / ED Diagnoses   Final diagnoses:  Rectal bleeding  Symptomatic anemia  Anticoagulated    ED Discharge Orders    None       Virgel Manifold, MD 02/03/18 2325    Virgel Manifold, MD 02/07/18 1358

## 2018-02-03 NOTE — ED Notes (Signed)
Call to lab re: new orders  POC Ed

## 2018-02-03 NOTE — ED Notes (Signed)
Call to home phone number  Daughter, Rip Harbour, states she is not coming tonight and will come in the am   States that her father is a full code per his wishes  She reports that he has a new med morphine and she gave him a 1mg  tablet today, he has also been prescribed a stool softener

## 2018-02-04 ENCOUNTER — Encounter (HOSPITAL_COMMUNITY): Payer: Self-pay

## 2018-02-04 DIAGNOSIS — K5730 Diverticulosis of large intestine without perforation or abscess without bleeding: Secondary | ICD-10-CM

## 2018-02-04 DIAGNOSIS — J441 Chronic obstructive pulmonary disease with (acute) exacerbation: Secondary | ICD-10-CM

## 2018-02-04 DIAGNOSIS — I480 Paroxysmal atrial fibrillation: Secondary | ICD-10-CM

## 2018-02-04 DIAGNOSIS — K625 Hemorrhage of anus and rectum: Secondary | ICD-10-CM

## 2018-02-04 DIAGNOSIS — D649 Anemia, unspecified: Secondary | ICD-10-CM

## 2018-02-04 LAB — COMPREHENSIVE METABOLIC PANEL
ALK PHOS: 44 U/L (ref 38–126)
ALT: 16 U/L — AB (ref 17–63)
AST: 14 U/L — ABNORMAL LOW (ref 15–41)
Albumin: 2.6 g/dL — ABNORMAL LOW (ref 3.5–5.0)
Anion gap: 9 (ref 5–15)
BUN: 33 mg/dL — ABNORMAL HIGH (ref 6–20)
CALCIUM: 7.9 mg/dL — AB (ref 8.9–10.3)
CO2: 36 mmol/L — ABNORMAL HIGH (ref 22–32)
Chloride: 94 mmol/L — ABNORMAL LOW (ref 101–111)
Creatinine, Ser: 0.84 mg/dL (ref 0.61–1.24)
Glucose, Bld: 147 mg/dL — ABNORMAL HIGH (ref 65–99)
Potassium: 3.2 mmol/L — ABNORMAL LOW (ref 3.5–5.1)
Sodium: 139 mmol/L (ref 135–145)
TOTAL PROTEIN: 4.8 g/dL — AB (ref 6.5–8.1)
Total Bilirubin: 0.4 mg/dL (ref 0.3–1.2)

## 2018-02-04 LAB — HEMOGLOBIN A1C
Hgb A1c MFr Bld: 8.3 % — ABNORMAL HIGH (ref 4.8–5.6)
Mean Plasma Glucose: 191.51 mg/dL

## 2018-02-04 LAB — CBC
HCT: 28.1 % — ABNORMAL LOW (ref 39.0–52.0)
HCT: 30 % — ABNORMAL LOW (ref 39.0–52.0)
HEMOGLOBIN: 8.7 g/dL — AB (ref 13.0–17.0)
Hemoglobin: 9.7 g/dL — ABNORMAL LOW (ref 13.0–17.0)
MCH: 25.7 pg — AB (ref 26.0–34.0)
MCH: 26.7 pg (ref 26.0–34.0)
MCHC: 31 g/dL (ref 30.0–36.0)
MCHC: 32.3 g/dL (ref 30.0–36.0)
MCV: 83.1 fL (ref 78.0–100.0)
MCV: 82.6 fL (ref 78.0–100.0)
PLATELETS: 269 10*3/uL (ref 150–400)
Platelets: 267 10*3/uL (ref 150–400)
RBC: 3.38 MIL/uL — AB (ref 4.22–5.81)
RBC: 3.63 MIL/uL — ABNORMAL LOW (ref 4.22–5.81)
RDW: 17.2 % — ABNORMAL HIGH (ref 11.5–15.5)
RDW: 15.9 % — ABNORMAL HIGH (ref 11.5–15.5)
WBC: 14.6 10*3/uL — ABNORMAL HIGH (ref 4.0–10.5)
WBC: 15.4 10*3/uL — ABNORMAL HIGH (ref 4.0–10.5)

## 2018-02-04 LAB — GLUCOSE, CAPILLARY
GLUCOSE-CAPILLARY: 141 mg/dL — AB (ref 65–99)
GLUCOSE-CAPILLARY: 180 mg/dL — AB (ref 65–99)
GLUCOSE-CAPILLARY: 254 mg/dL — AB (ref 65–99)
Glucose-Capillary: 118 mg/dL — ABNORMAL HIGH (ref 65–99)

## 2018-02-04 LAB — PREPARE RBC (CROSSMATCH)

## 2018-02-04 LAB — MRSA PCR SCREENING: MRSA by PCR: NEGATIVE

## 2018-02-04 LAB — CBG MONITORING, ED: Glucose-Capillary: 132 mg/dL — ABNORMAL HIGH (ref 65–99)

## 2018-02-04 LAB — ABO/RH: ABO/RH(D): A POS

## 2018-02-04 MED ORDER — ALPRAZOLAM 0.25 MG PO TABS: 0.2500 mg | ORAL_TABLET | Freq: Three times a day (TID) | ORAL | Status: DC | PRN

## 2018-02-04 MED ORDER — ACETAMINOPHEN 650 MG RE SUPP: 650.0000 mg | Freq: Four times a day (QID) | RECTAL | Status: DC | PRN

## 2018-02-04 MED ORDER — SODIUM CHLORIDE 0.9 % IV SOLN
Freq: Once | INTRAVENOUS | Status: AC
  Administered 2018-02-04: 08:00:00 via INTRAVENOUS

## 2018-02-04 MED ORDER — ONDANSETRON HCL 4 MG/2ML IJ SOLN: 4.0000 mg | Freq: Four times a day (QID) | INTRAMUSCULAR | Status: DC | PRN

## 2018-02-04 MED ORDER — DILTIAZEM HCL ER COATED BEADS 180 MG PO CP24
300.0000 mg | ORAL_CAPSULE | Freq: Every day | ORAL | Status: DC
  Administered 2018-02-04 – 2018-02-07 (×4): 300 mg via ORAL
  Filled 2018-02-04 (×4): qty 1

## 2018-02-04 MED ORDER — ACETAMINOPHEN 325 MG PO TABS
650.0000 mg | ORAL_TABLET | Freq: Four times a day (QID) | ORAL | Status: DC | PRN
  Administered 2018-02-07: 650 mg via ORAL
  Filled 2018-02-04: qty 2

## 2018-02-04 MED ORDER — HYDROCORTISONE NA SUCCINATE PF 100 MG IJ SOLR
50.0000 mg | Freq: Three times a day (TID) | INTRAMUSCULAR | Status: DC
  Administered 2018-02-04: 50 mg via INTRAVENOUS
  Filled 2018-02-04: qty 2

## 2018-02-04 MED ORDER — TIOTROPIUM BROMIDE MONOHYDRATE 18 MCG IN CAPS
18.0000 ug | ORAL_CAPSULE | Freq: Every day | RESPIRATORY_TRACT | Status: DC
Start: 2018-02-04 — End: 2018-02-07
  Administered 2018-02-05 – 2018-02-07 (×3): 18 ug via RESPIRATORY_TRACT
  Filled 2018-02-04: qty 5

## 2018-02-04 MED ORDER — CYCLOBENZAPRINE HCL 10 MG PO TABS
5.0000 mg | ORAL_TABLET | Freq: Every day | ORAL | Status: DC
  Administered 2018-02-04 – 2018-02-06 (×3): 5 mg via ORAL
  Filled 2018-02-04 (×3): qty 1

## 2018-02-04 MED ORDER — PROTHROMBIN COMPLEX CONC HUMAN 500 UNITS IV KIT
1000.0000 [IU] | PACK | Freq: Once | Status: AC
  Administered 2018-02-04: 1000 [IU] via INTRAVENOUS
  Filled 2018-02-04: qty 1000

## 2018-02-04 MED ORDER — FUROSEMIDE 10 MG/ML IJ SOLN
40.0000 mg | Freq: Once | INTRAMUSCULAR | Status: AC
  Administered 2018-02-04: 40 mg via INTRAVENOUS
  Filled 2018-02-04: qty 4

## 2018-02-04 MED ORDER — INSULIN ASPART 100 UNIT/ML ~~LOC~~ SOLN
0.0000 [IU] | Freq: Four times a day (QID) | SUBCUTANEOUS | Status: DC
  Administered 2018-02-04: 2 [IU] via SUBCUTANEOUS
  Administered 2018-02-04 (×2): 5 [IU] via SUBCUTANEOUS
  Administered 2018-02-04 – 2018-02-05 (×2): 1 [IU] via SUBCUTANEOUS
  Administered 2018-02-05: 2 [IU] via SUBCUTANEOUS
  Administered 2018-02-05: 7 [IU] via SUBCUTANEOUS
  Administered 2018-02-06: 1 [IU] via SUBCUTANEOUS
  Administered 2018-02-06: 2 [IU] via SUBCUTANEOUS
  Administered 2018-02-06: 1 [IU] via SUBCUTANEOUS
  Administered 2018-02-07 (×2): 3 [IU] via SUBCUTANEOUS

## 2018-02-04 MED ORDER — MOMETASONE FURO-FORMOTEROL FUM 200-5 MCG/ACT IN AERO
2.0000 | INHALATION_SPRAY | Freq: Two times a day (BID) | RESPIRATORY_TRACT | Status: DC
  Administered 2018-02-04 – 2018-02-07 (×6): 2 via RESPIRATORY_TRACT
  Filled 2018-02-04: qty 8.8

## 2018-02-04 MED ORDER — BOOST / RESOURCE BREEZE PO LIQD CUSTOM
1.0000 | Freq: Three times a day (TID) | ORAL | Status: DC
  Administered 2018-02-04 – 2018-02-07 (×8): 1 via ORAL

## 2018-02-04 MED ORDER — ONDANSETRON HCL 4 MG PO TABS: 4.0000 mg | ORAL_TABLET | Freq: Four times a day (QID) | ORAL | Status: DC | PRN

## 2018-02-04 MED ORDER — SODIUM CHLORIDE 0.9 % IV SOLN
INTRAVENOUS | Status: DC
  Administered 2018-02-04: 04:00:00 via INTRAVENOUS

## 2018-02-04 MED ORDER — ALBUTEROL SULFATE (2.5 MG/3ML) 0.083% IN NEBU
2.5000 mg | INHALATION_SOLUTION | RESPIRATORY_TRACT | Status: DC | PRN
  Administered 2018-02-04 – 2018-02-07 (×3): 2.5 mg via RESPIRATORY_TRACT
  Filled 2018-02-04 (×3): qty 3

## 2018-02-04 MED ORDER — MONTELUKAST SODIUM 10 MG PO TABS
10.0000 mg | ORAL_TABLET | Freq: Every day | ORAL | Status: DC
  Administered 2018-02-04 – 2018-02-06 (×3): 10 mg via ORAL
  Filled 2018-02-04 (×3): qty 1

## 2018-02-04 NOTE — Progress Notes (Signed)
PROGRESS NOTE    Andrew [name]  VZD:[id number] DOB: 27-Oct-1940 DOA: 02/03/2018 PCP: [facility]  Brief Narrative: Andrew [name]  is a 77 y.o. male, with history of COPD, chronic respiratory failure on chronic home O2 4 L/min, chronic diastolic CHF, dysphagia with recurrent aspiration, atrial fibrillation on anticoagulation with Eliquis was brought to the hospital after patient developed rectal bleeding for 1-2days. Hb dropped to 8.7 from 11-12 range  Assessment & Plan:     Painless lower GI bleeding -most likely diverticular, appears to be resolving -monitor Hb -transfuse 1 unit PRBC due to symptomatic anemia -last colonoscopy in 2008 with diverticulosis -holding Eliquis, s/p Kcentra yesterday -if bleeding recurs will check Bleeding scan and consult GI    Acute blood loss anemia -transfuse 1 unit PRBC today due to symptomatic anemia -monitor Hb   Chronic diastolic CHF -appears vol overloade due to anemia -IV lasix and PRBC transfusion today   COPD/CHronic resp failure -on 4L Home O2 -no wheezing, nebs PRN -resume home dose of prednisone    Diabetes mellitus (HCC)  -CBGS stable, continue SSI, lantus and glipizide on hold   P.Afib -rate controlled, resume cardizem -eliquis held  Dysphagia -on regular diet and thin liquis following last SLP eval in April -monitor  DVT prophylaxis: SCDs Code Status: [place] Family Communication: None at bedside Disposition Plan: Home in few days pending clinical improvement  Consultants:    Procedures:   Antimicrobials:    Subjective: -no further bleeding -continues to have dyspnea, feels poorly  Objective: Vitals:   02/04/18 0139 02/04/18 0200 02/04/18 0230 02/04/18 0733  BP:  131/72 (!) 149/71 (!) 148/80  Pulse:  (!) 56 63 82  Resp:  14 15 (!) 24  Temp: (!) 97.5 F (36.4 C)   98.1 F (36.7 C)  TempSrc: Oral   Oral  SpO2:  98% 99% 97%  Weight:      Height:        Intake/Output Summary (Last 24 hours) at  02/04/2018 1002 Last data filed at 02/04/2018 0500 Gross per 24 hour  Intake 507.17 ml  Output 250 ml  Net 257.17 ml   Filed Weights   02/03/18 2140  Weight: 79.4 kg (175 lb)    Examination:  General exam: Appears calm and comfortable  Respiratory system: fine basilar crackles Cardiovascular system: S1 & S2 heard, RRR.   Gastrointestinal system: Abdomen is nondistended, soft and nontender.Normal bowel sounds heard. Central nervous system: Alert and oriented. No focal neurological deficits. Extremities:1plus edema Skin: No rashes, lesions or ulcers Psychiatry: Judgement and insight appear normal. Mood & affect appropriate.     Data Reviewed:   CBC: Recent Labs  Lab 02/03/18 2158 02/04/18 0433  WBC 13.9* 14.6*  HGB 8.4* 8.7*  HCT 26.7* 28.1*  MCV 83.4 83.1  PLT 226 295   Basic Metabolic Panel: Recent Labs  Lab 02/03/18 2158 02/04/18 0433  NA 139 139  K 4.2 3.2*  CL 92* 94*  CO2 38* 36*  GLUCOSE 324* 147*  BUN 41* 33*  CREATININE 1.06 0.84  CALCIUM 7.9* 7.9*   GFR: Estimated Creatinine Clearance: 69.3 mL/min (by C-G formula based on SCr of 0.84 mg/dL). Liver Function Tests: Recent Labs  Lab 02/03/18 2158 02/04/18 0433  AST 18 14*  ALT 15* 16*  ALKPHOS 41 44  BILITOT 0.4 0.4  PROT 4.6* 4.8*  ALBUMIN 2.4* 2.6*   No results for input(s): LIPASE, AMYLASE in the last 168 hours. No results for input(s): AMMONIA in the  last 168 hours. Coagulation Profile: Recent Labs  Lab 02/03/18 2159  INR 1.49   Cardiac Enzymes: No results for input(s): CKTOTAL, CKMB, CKMBINDEX, TROPONINI in the last 168 hours. BNP (last 3 results) No results for input(s): PROBNP in the last 8760 hours. HbA1C: No results for input(s): HGBA1C in the last 72 hours. CBG: Recent Labs  Lab 02/03/18 2153 02/04/18 0140 02/04/18 0403 02/04/18 0736  GLUCAP 321* 132* 141* 180*   Lipid Profile: No results for input(s): CHOL, HDL, LDLCALC, TRIG, CHOLHDL, LDLDIRECT in the last 72  hours. Thyroid Function Tests: No results for input(s): TSH, T4TOTAL, FREET4, T3FREE, THYROIDAB in the last 72 hours. Anemia Panel: No results for input(s): VITAMINB12, FOLATE, FERRITIN, TIBC, IRON, RETICCTPCT in the last 72 hours. Urine analysis:    Component Value Date/Time   COLORURINE AMBER (A) 05/05/2013 1621   APPEARANCEUR CLEAR 05/05/2013 1621   LABSPEC 1.025 05/05/2013 1621   PHURINE 5.5 05/05/2013 1621   GLUCOSEU NEGATIVE 05/05/2013 1621   HGBUR NEGATIVE 05/05/2013 1621   HGBUR negative 03/31/2009 1514   BILIRUBINUR SMALL (A) 05/05/2013 1621   KETONESUR NEGATIVE 05/05/2013 1621   PROTEINUR NEGATIVE 05/05/2013 1621   UROBILINOGEN 1.0 05/05/2013 1621   NITRITE NEGATIVE 05/05/2013 1621   LEUKOCYTESUR NEGATIVE 05/05/2013 1621   Sepsis Labs: @LABRCNTIP (procalcitonin:4,lacticidven:4)  ) Recent Results (from the past 240 hour(s))  MRSA PCR Screening     Status: None   Collection Time: 02/04/18  3:45 AM  Result Value Ref Range Status   MRSA by PCR NEGATIVE NEGATIVE Final    Comment:        The GeneXpert MRSA Assay (FDA approved for NASAL specimens only), is one component of a comprehensive MRSA colonization surveillance program. It is not intended to diagnose MRSA infection nor to guide or monitor treatment for MRSA infections. Performed at [facility], [place] [address], [place], [place] [address]          Radiology Studies: Dg Chest Portable 1 View  Result Date: 02/03/2018 CLINICAL DATA:  GI bleed, shortness of breath. Recent hospitalization. EXAM: PORTABLE CHEST 1 VIEW COMPARISON:  Chest radiograph Jan 24, 2018 FINDINGS: Cardiomediastinal silhouette is normal. Calcified aortic arch. Persistent LEFT lung base patchy airspace opacity. No pleural effusion. No pneumothorax. Soft tissue planes and included osseous structures are nonsuspicious. IMPRESSION: Persistent LEFT lung base atelectasis, scarring or pneumonia. Aortic Atherosclerosis (ICD10-I70.0).  Electronically Signed   By: Elon Alas M.D.   On: 02/03/2018 22:35        Scheduled Meds: . feeding supplement  1 Container Oral TID BM  . hydrocortisone sod succinate (SOLU-CORTEF) inj  50 mg Intravenous Q8H  . insulin aspart  0-9 Units Subcutaneous Q6H   Continuous Infusions: . sodium chloride 10 mL/hr at 02/04/18 0417     LOS: 1 day    Time spent: 20min    Domenic Polite, MD [facility] Page via [email or web address], password [email or web address] After 7PM please contact night-coverage  02/04/2018, 10:02 AM

## 2018-02-04 NOTE — ED Notes (Signed)
Pt updated on plan of care,  

## 2018-02-04 NOTE — ED Notes (Signed)
[facility] here to transport pt, Per [facility] at [facility] pt will go to [facility],

## 2018-02-04 NOTE — ED Notes (Signed)
Message left on daughter's cell phone reference transfer of father to [facility] and room number

## 2018-02-04 NOTE — Plan of Care (Signed)
Discuused plan of care with patient.  Some teach back displayed.  Stressed the importance of using the call bell when assistance is needed.

## 2018-02-04 NOTE — Progress Notes (Signed)
There is no additional monitoring to be completed after Andrew [name] is given for Eliquis reversal as INRs or other anticoagulation labs are not helpful and a re-dose is not recommended.  Pharmacy will follow peripherally.  Cesilia Shinn D. Shell Yandow, PharmD, BCPS Clinical Pharmacist [phone] Please check AMION for all [place] numbers 02/04/2018 10:40 AM

## 2018-02-04 NOTE — Plan of Care (Signed)
Updated patient and patient's family via phone call about current care plan. Patient verbalized understanding.Patient signed informed consent to received blood products and reports he has no questions about why he needs a transfusion. Will continue to monitor and educate patient.

## 2018-02-05 LAB — GLUCOSE, CAPILLARY
Glucose-Capillary: 141 mg/dL — ABNORMAL HIGH (ref 65–99)
Glucose-Capillary: 310 mg/dL — ABNORMAL HIGH (ref 65–99)
Glucose-Capillary: 306 mg/dL — ABNORMAL HIGH (ref 65–99)
Glucose-Capillary: 199 mg/dL — ABNORMAL HIGH (ref 65–99)

## 2018-02-05 LAB — CBC
HEMATOCRIT: 25.3 % — AB (ref 39.0–52.0)
Hemoglobin: 8.2 g/dL — ABNORMAL LOW (ref 13.0–17.0)
MCH: 26.8 pg (ref 26.0–34.0)
MCHC: 32.4 g/dL (ref 30.0–36.0)
MCV: 82.7 fL (ref 78.0–100.0)
Platelets: 238 10*3/uL (ref 150–400)
RBC: 3.06 MIL/uL — ABNORMAL LOW (ref 4.22–5.81)
RDW: 16.2 % — AB (ref 11.5–15.5)
WBC: 11.6 10*3/uL — ABNORMAL HIGH (ref 4.0–10.5)

## 2018-02-05 LAB — HEMOGLOBIN AND HEMATOCRIT, BLOOD
HCT: 25.2 % — ABNORMAL LOW (ref 39.0–52.0)
Hemoglobin: 8 g/dL — ABNORMAL LOW (ref 13.0–17.0)

## 2018-02-05 MED ORDER — POTASSIUM CHLORIDE CRYS ER 20 MEQ PO TBCR
40.0000 meq | EXTENDED_RELEASE_TABLET | Freq: Once | ORAL | Status: AC
  Administered 2018-02-05: 40 meq via ORAL
  Filled 2018-02-05: qty 2

## 2018-02-05 MED ORDER — FUROSEMIDE 20 MG PO TABS
20.0000 mg | ORAL_TABLET | Freq: Every day | ORAL | Status: DC
  Administered 2018-02-05: 20 mg via ORAL
  Filled 2018-02-05: qty 1

## 2018-02-05 MED ORDER — PREDNISONE 10 MG PO TABS
10.0000 mg | ORAL_TABLET | Freq: Every day | ORAL | Status: DC
Start: 2018-02-06 — End: 2018-02-07
  Administered 2018-02-06 – 2018-02-07 (×2): 10 mg via ORAL
  Filled 2018-02-05 (×2): qty 1

## 2018-02-05 MED ORDER — INSULIN GLARGINE 100 UNIT/ML ~~LOC~~ SOLN
20.0000 [IU] | Freq: Every day | SUBCUTANEOUS | Status: DC
  Administered 2018-02-05 – 2018-02-06 (×2): 20 [IU] via SUBCUTANEOUS
  Filled 2018-02-05 (×3): qty 0.2

## 2018-02-05 NOTE — Progress Notes (Signed)
[facility]  Patient Status: Active (receiving services up to time of hospitalization)  [facility] is providing the following services: RN and PT  If patient discharges after hours, please call [phone].   Andrew [name] 02/05/2018, 10:44 AM

## 2018-02-05 NOTE — Plan of Care (Signed)
Discussed plan of care with patient.  Patient expressed "this is the best hospital he has ever been to".  Patient displayed some teach back.

## 2018-02-05 NOTE — Progress Notes (Signed)
PROGRESS NOTE    Andrew [name]  QVZ:[id number] DOB: [birth year] DOA: 02/03/2018 PCP: [facility]  Brief Narrative: Andrew [name]  is a 77 y.o. male, with history of COPD, chronic respiratory failure on chronic home O2 4 L/min, chronic diastolic CHF, dysphagia with recurrent aspiration, atrial fibrillation on anticoagulation with Eliquis was brought to the hospital after patient developed rectal bleeding for 1-2days. Hb dropped to 8.7 from 11-12 range  Assessment & Plan:     Painless lower GI bleeding -diverticular, no further episodes, appears to have resolved -unfortunately hemoglobin has drifted down a little, suspect still re-equilibrating -he was transfused 1 unit of PRBC yesterday a.m. -last colonoscopy in 2008 with diverticulosis -held Eliquis, s/p Kcentra on admission -ambulate, physical therapy evaluation -Home tomorrow if hemoglobin stable and no further bleeding    Acute blood loss anemia -due to hematochezia, no further episodes of bleeding -Hemoglobin appears to have drifted down a little, suspect this is re- equilibrating -check CBC in a.m.   acute on chronic diastolic CHF -appeared vol overloaded due to anemia -given IV lasix and PRBC transfusion yesterday -By mouth Lasix low dose today   COPD/CHronic resp failure -on 4L Home O2 -no wheezing, nebs PRN -resume home dose of prednisone 10mg  daily    Diabetes mellitus ([place])  -CBGS stable, continue SSI,  -restart lantus,  glipizide on hold   P.Afib -rate controlled, resumed cardizem -eliquis held, due to hematochezia  Dysphagia -on regular diet and thin liquis following last SLP eval in April -monitor  DVT prophylaxis: SCDs Code Status: [place] Family Communication: None at bedside Disposition Plan: Home tomorrow if hemoglobin stable and no further bleeding  Consultants:    Procedures:   Antimicrobials:    Subjective: -reports being a little tired, no further bleeding, hasn't had a bowel  movement since admission  Objective: Vitals:   02/05/18 0053 02/05/18 0354 02/05/18 0734 02/05/18 1153  BP: 109/62 (!) 116/54 121/67 115/61  Pulse: 76 72 69 (!) 48  Resp: 17  (!) 24 16  Temp: 99 F (37.2 C) 98.5 F (36.9 C) 99.3 F (37.4 C) 99 F (37.2 C)  TempSrc: Oral Oral Oral Oral  SpO2: 90% 95% 92% 97%  Weight:      Height:        Intake/Output Summary (Last 24 hours) at 02/05/2018 1517 Last data filed at 02/05/2018 0357 Gross per 24 hour  Intake 315 ml  Output 1550 ml  Net -1235 ml   Filed Weights   02/03/18 2140  Weight: 79.4 kg (175 lb)    Examination:  Gen: somnolent, drowsy, just waking up, able to answer all my questions appropriately, oriented 3 HEENT: PERRLA, Neck supple, no JVD Lungs: improved air movement, scattered rhonchi CVS: RRR,No Gallops,Rubs or new Murmurs Abd: soft, Non tender, non distended, BS present Extremities: trace edema Skin: no new rashes Psychiatry: Judgement and insight appear normal. Mood & affect appropriate.     Data Reviewed:   CBC: Recent Labs  Lab 02/03/18 2158 02/04/18 0433 02/04/18 1856 02/05/18 0800 02/05/18 1319  WBC 13.9* 14.6* 15.4* 11.6*  --   HGB 8.4* 8.7* 9.7* 8.2* 8.0*  HCT 26.7* 28.1* 30.0* 25.3* 25.2*  MCV 83.4 83.1 82.6 82.7  --   PLT 226 267 269 238  --    Basic Metabolic Panel: Recent Labs  Lab 02/03/18 2158 02/04/18 0433  NA 139 139  K 4.2 3.2*  CL 92* 94*  CO2 38* 36*  GLUCOSE 324* 147*  BUN 41*  33*  CREATININE 1.06 0.84  CALCIUM 7.9* 7.9*   GFR: Estimated Creatinine Clearance: 69.3 mL/min (by C-G formula based on SCr of 0.84 mg/dL). Liver Function Tests: Recent Labs  Lab 02/03/18 2158 02/04/18 0433  AST 18 14*  ALT 15* 16*  ALKPHOS 41 44  BILITOT 0.4 0.4  PROT 4.6* 4.8*  ALBUMIN 2.4* 2.6*   No results for input(s): LIPASE, AMYLASE in the last 168 hours. No results for input(s): AMMONIA in the last 168 hours. Coagulation Profile: Recent Labs  Lab 02/03/18 2159  INR 1.49     Cardiac Enzymes: No results for input(s): CKTOTAL, CKMB, CKMBINDEX, TROPONINI in the last 168 hours. BNP (last 3 results) No results for input(s): PROBNP in the last 8760 hours. HbA1C: Recent Labs    02/04/18 0433  HGBA1C 8.3*   CBG: Recent Labs  Lab 02/04/18 0736 02/04/18 1812 02/04/18 2353 02/05/18 0608 02/05/18 1151  GLUCAP 180* 254* 118* 141* 310*   Lipid Profile: No results for input(s): CHOL, HDL, LDLCALC, TRIG, CHOLHDL, LDLDIRECT in the last 72 hours. Thyroid Function Tests: No results for input(s): TSH, T4TOTAL, FREET4, T3FREE, THYROIDAB in the last 72 hours. Anemia Panel: No results for input(s): VITAMINB12, FOLATE, FERRITIN, TIBC, IRON, RETICCTPCT in the last 72 hours. Urine analysis:    Component Value Date/Time   COLORURINE AMBER (A) 05/05/2013 1621   APPEARANCEUR CLEAR 05/05/2013 1621   LABSPEC 1.025 05/05/2013 1621   PHURINE 5.5 05/05/2013 1621   GLUCOSEU NEGATIVE 05/05/2013 1621   HGBUR NEGATIVE 05/05/2013 1621   HGBUR negative 03/31/2009 1514   BILIRUBINUR SMALL (A) 05/05/2013 1621   KETONESUR NEGATIVE 05/05/2013 1621   PROTEINUR NEGATIVE 05/05/2013 1621   UROBILINOGEN 1.0 05/05/2013 1621   NITRITE NEGATIVE 05/05/2013 1621   LEUKOCYTESUR NEGATIVE 05/05/2013 1621   Sepsis Labs: @LABRCNTIP (procalcitonin:4,lacticidven:4)  ) Recent Results (from the past 240 hour(s))  MRSA PCR Screening     Status: None   Collection Time: 02/04/18  3:45 AM  Result Value Ref Range Status   MRSA by PCR NEGATIVE NEGATIVE Final    Comment:        The GeneXpert MRSA Assay (FDA approved for NASAL specimens only), is one component of a comprehensive MRSA colonization surveillance program. It is not intended to diagnose MRSA infection nor to guide or monitor treatment for MRSA infections. Performed at [facility], [place] [address] [place], [place] [address]          Radiology Studies: Dg Chest Portable 1 View  Result Date: 02/03/2018 CLINICAL DATA:   GI bleed, shortness of breath. Recent hospitalization. EXAM: PORTABLE CHEST 1 VIEW COMPARISON:  Chest radiograph Jan 24, 2018 FINDINGS: Cardiomediastinal silhouette is normal. Calcified aortic arch. Persistent LEFT lung base patchy airspace opacity. No pleural effusion. No pneumothorax. Soft tissue planes and included osseous structures are nonsuspicious. IMPRESSION: Persistent LEFT lung base atelectasis, scarring or pneumonia. Aortic Atherosclerosis (ICD10-I70.0). Electronically Signed   By: Elon Alas M.D.   On: 02/03/2018 22:35        Scheduled Meds: . cyclobenzaprine  5 mg Oral QHS  . diltiazem  300 mg Oral Daily  . feeding supplement  1 Container Oral TID BM  . insulin aspart  0-9 Units Subcutaneous Q6H  . mometasone-formoterol  2 puff Inhalation BID  . montelukast  10 mg Oral QHS  . tiotropium  18 mcg Inhalation Daily   Continuous Infusions:    LOS: 2 days    Time spent: 68min    Domenic Polite, MD [facility]  Page via [email or web address], password [email or web address] After 7PM please contact night-coverage  02/05/2018, 3:17 PM

## 2018-02-05 NOTE — Progress Notes (Addendum)
Patient refused AM lab work. 

## 2018-02-06 LAB — BASIC METABOLIC PANEL
ANION GAP: 7 (ref 5–15)
ANION GAP: 9 (ref 5–15)
BUN: 17 mg/dL (ref 6–20)
BUN: 22 mg/dL — AB (ref 6–20)
CHLORIDE: 96 mmol/L — AB (ref 101–111)
CO2: 40 mmol/L — ABNORMAL HIGH (ref 22–32)
CO2: 35 mmol/L — ABNORMAL HIGH (ref 22–32)
Calcium: 8.1 mg/dL — ABNORMAL LOW (ref 8.9–10.3)
Calcium: 8.5 mg/dL — ABNORMAL LOW (ref 8.9–10.3)
Chloride: 94 mmol/L — ABNORMAL LOW (ref 101–111)
Creatinine, Ser: 0.69 mg/dL (ref 0.61–1.24)
Creatinine, Ser: 0.73 mg/dL (ref 0.61–1.24)
GFR calc Af Amer: >60 mL/min (ref >60)
GFR calc Af Amer: >60 mL/min (ref >60)
GLUCOSE: 199 mg/dL — AB (ref 65–99)
Glucose, Bld: 143 mg/dL — ABNORMAL HIGH (ref 65–99)
POTASSIUM: 2.8 mmol/L — AB (ref 3.5–5.1)
POTASSIUM: 3.7 mmol/L (ref 3.5–5.1)
SODIUM: 141 mmol/L (ref 135–145)
Sodium: 140 mmol/L (ref 135–145)

## 2018-02-06 LAB — CBC
HCT: 25.8 % — ABNORMAL LOW (ref 39.0–52.0)
HEMATOCRIT: 28.9 % — AB (ref 39.0–52.0)
HEMOGLOBIN: 9.4 g/dL — AB (ref 13.0–17.0)
Hemoglobin: 8.3 g/dL — ABNORMAL LOW (ref 13.0–17.0)
MCH: 26.9 pg (ref 26.0–34.0)
MCH: 27.3 pg (ref 26.0–34.0)
MCHC: 32.2 g/dL (ref 30.0–36.0)
MCHC: 32.5 g/dL (ref 30.0–36.0)
MCV: 83.5 fL (ref 78.0–100.0)
MCV: 84 fL (ref 78.0–100.0)
PLATELETS: 227 10*3/uL (ref 150–400)
Platelets: 235 10*3/uL (ref 150–400)
RBC: 3.09 MIL/uL — AB (ref 4.22–5.81)
RBC: 3.44 MIL/uL — ABNORMAL LOW (ref 4.22–5.81)
RDW: 16.2 % — ABNORMAL HIGH (ref 11.5–15.5)
RDW: 15.9 % — AB (ref 11.5–15.5)
WBC: 11.9 10*3/uL — AB (ref 4.0–10.5)
WBC: 11.3 10*3/uL — AB (ref 4.0–10.5)

## 2018-02-06 LAB — GLUCOSE, CAPILLARY
GLUCOSE-CAPILLARY: 484 mg/dL — AB (ref 65–99)
GLUCOSE-CAPILLARY: 146 mg/dL — AB (ref 65–99)
Glucose-Capillary: 139 mg/dL — ABNORMAL HIGH (ref 65–99)
Glucose-Capillary: 173 mg/dL — ABNORMAL HIGH (ref 65–99)

## 2018-02-06 LAB — PREPARE RBC (CROSSMATCH)

## 2018-02-06 MED ORDER — FUROSEMIDE 20 MG PO TABS
20.0000 mg | ORAL_TABLET | Freq: Every day | ORAL | Status: DC
  Administered 2018-02-07: 20 mg via ORAL
  Filled 2018-02-06: qty 1

## 2018-02-06 MED ORDER — INSULIN ASPART 100 UNIT/ML ~~LOC~~ SOLN
15.0000 [IU] | Freq: Once | SUBCUTANEOUS | Status: AC
  Administered 2018-02-06: 15 [IU] via SUBCUTANEOUS

## 2018-02-06 MED ORDER — SODIUM CHLORIDE 0.9 % IV SOLN
Freq: Once | INTRAVENOUS | Status: AC
  Administered 2018-02-06: 11:00:00 via INTRAVENOUS

## 2018-02-06 MED ORDER — POTASSIUM CHLORIDE CRYS ER 20 MEQ PO TBCR
40.0000 meq | EXTENDED_RELEASE_TABLET | ORAL | Status: AC
  Administered 2018-02-06 (×3): 40 meq via ORAL
  Filled 2018-02-06 (×3): qty 2

## 2018-02-06 MED ORDER — APIXABAN 5 MG PO TABS: 5.0000 mg | ORAL_TABLET | Freq: Two times a day (BID) | ORAL | 0 refills | Status: DC

## 2018-02-06 NOTE — Care Management Note (Addendum)
Case Management Note  Patient Details  Name: DERELLE COCKRELL MRN: [id number] Date of Birth: 02/28/1941  Subjective/Objective:  From home with daughter, presents with GIB, has chronic chf, copd, patient is active with [facility] for [facility], HHPT, will need resumption orders prior to dc. Awaiting pt eval.                  Action/Plan: DC home when ready.  Expected Discharge Date:                  Expected Discharge Plan:  [place]  In-House Referral:     Discharge planning Services  CM Consult  Post Acute Care Choice:  Resumption of Svcs/PTA Provider Choice offered to:     DME Arranged:    DME Agency:     HH Arranged:    [place] Agency:     Status of Service:  In process, will continue to follow  If discussed at Long Length of Stay Meetings, dates discussed:    Additional Comments:  Zenon Mayo, RN 02/06/2018, 9:15 AM

## 2018-02-06 NOTE — Care Management Important Message (Signed)
Important Message  Patient Details  Name: Andrew [name] MRN: [id number] Date of Birth: 03-03-1941   Medicare Important Message Given:  Yes    Orbie Pyo 02/06/2018, 2:25 PM

## 2018-02-06 NOTE — Progress Notes (Signed)
Inpatient Diabetes Program Recommendations  AACE/ADA: New Consensus Statement on Inpatient Glycemic Control (2015)  Target Ranges:  Prepandial:   less than 140 mg/dL      Peak postprandial:   less than 180 mg/dL (1-2 hours)      Critically ill patients:  140 - 180 mg/dL   Lab Results  Component Value Date   GLUCAP 139 (H) 02/06/2018   HGBA1C 8.3 (H) 02/04/2018    Review of Glycemic Control Results for Andrew [name], Andrew [name] (MRN [id number]) as of 02/06/2018 10:07  Ref. Range 02/05/2018 11:51 02/05/2018 17:16 02/05/2018 23:04 02/06/2018 04:23  Glucose-Capillary Latest Ref Range: 65 - 99 mg/dL 310 (H) 306 (H) 199 (H) 139 (H)   Diabetes history: Type 2 DM Outpatient Diabetes medications: Glipizide 5mg  QD, Novolog 0-8 units SSI TID, Lantus 25 units QD Current orders for Inpatient glycemic control: Lantus 20 units QHS, Novolog 0-9 units Q6H  Inpatient Diabetes Program Recommendations:    Noted admin of Prednisone 10mg  this AM, anticipate post prandials will exceed 180 mg/dL. In this event, consider Novolog 3 units TID (assuming that patient is consuming >50%) and consider switching correction Novolog 0-9 units to TID.  Thanks, Bronson Curb, MSN, RNC-OB Diabetes Coordinator [phone] (8a-5p)

## 2018-02-06 NOTE — Evaluation (Signed)
Physical Therapy Evaluation Patient Details Name: Andrew [name] MRN: [id number] DOB: 02-17-1941 Today's Date: 02/06/2018   History of Present Illness  Pt is a 77 y.o. male admitted 02/03/18 after developing rectal bleeding and SOB; worked up for GIB and acute on chronic diastolic CHF. PMH includes COPD, chronic respiratory failure (4L home O2), CHF, dysphagia, a-fib (on Eliquis).    Clinical Impression  Pt presents with an overall decrease in functional mobility secondary to above. PTA, pt uses rollator for limited household ambulation and lives with daughter available for 24/7 support; daughter and [facility] aide (5x/wk) assist with bathing, dressing, and household tasks. Today, pt received saturated in urine; required assist for bathing. Performed multiple sit<>stands and amb short distance with RW and min guard for balance. Pt anxious regarding increased WOB; limited to standing ~30 sec periods before needing to sit; DOE 2/4 with SpO2 >90% on 4L O2 [place]. Spoke with daughter on phone to verify information, she plans to continue providing assist at home. Pt would benefit from continued acute PT services to maximize functional mobility and independence prior to d/c with HHPT services.     Follow Up Recommendations Home health PT;Supervision/Assistance - 24 hour    Equipment Recommendations  None recommended by PT    Recommendations for Other Services       Precautions / Restrictions Precautions Precautions: Fall Restrictions Weight Bearing Restrictions: No      Mobility  Bed Mobility Overal bed mobility: Needs Assistance Bed Mobility: Supine to Sit     Supine to sit: Min assist     General bed mobility comments: MinA for UE support to assist trunk elevation  Transfers Overall transfer level: Needs assistance Equipment used: Rolling walker (2 wheeled) Transfers: Sit to/from Stand Sit to Stand: Min guard         General transfer comment: Performed 4x sit<>stand from bed and recliner;  min guard for balance, cues for correct hand placement on RW  Ambulation/Gait Ambulation/Gait assistance: Min guard Ambulation Distance (Feet): 5 Feet Assistive device: Rolling walker (2 wheeled) Gait Pattern/deviations: Step-through pattern;Decreased stride length Gait velocity: Decreased Gait velocity interpretation: <1.31 ft/sec, indicative of household ambulator General Gait Details: Slow amb from bed to recliner with RW and min guard for balance. Pt c/o difficulty breathing; DOE 2/4 and SpO2 90% on 4L O2 [place]  Stairs            Wheelchair Mobility    Modified Rankin (Stroke Patients Only)       Balance Overall balance assessment: Needs assistance   Sitting balance-Leahy Scale: Fair       Standing balance-Leahy Scale: Poor Standing balance comment: Reliant on UE support to maintain standing balance; pt able to stand ~30 sec before needing to sit due to feeling SOB                             Pertinent Vitals/Pain Pain Assessment: No/denies pain    Home Living Family/patient expects to be discharged to:: Private residence Living Arrangements: Children Available Help at Discharge: Family;Available 24 hours/day;Home health Type of Home: House Home Access: Ramped entrance     Home Layout: One level Home Equipment: [place] - 4 wheels;Shower seat;Wheelchair - manual      Prior Function Level of Independence: Needs assistance   Gait / Transfers Assistance Needed: Amb short household distances with rollator. Does not drive  ADL's / Homemaking Assistance Needed: Pt reports "home health nurse" comes 5 days/wk for  3 hrs to assist with bathing (sponge baths), dressing, and household tasks. Daughter also assists with all aspects of care        Hand Dominance   Dominant Hand: Right    Extremity/Trunk Assessment   Upper Extremity Assessment Upper Extremity Assessment: Generalized weakness    Lower Extremity Assessment Lower Extremity Assessment:  Generalized weakness    Cervical / Trunk Assessment Cervical / Trunk Assessment: Kyphotic  Communication   Communication: HOH  Cognition Arousal/Alertness: Awake/alert Behavior During Therapy: WFL for tasks assessed/performed Overall Cognitive Status: No family/caregiver present to determine baseline cognitive functioning Area of Impairment: Attention;Memory;Following commands;Awareness;Problem solving                   Current Attention Level: Sustained Memory: Decreased short-term memory Following Commands: Follows multi-step commands inconsistently;Follows one step commands with increased time   Awareness: Emergent Problem Solving: Slow processing;Difficulty sequencing;Requires verbal cues General Comments: Likely baseline cognition      General Comments      Exercises     Assessment/Plan    PT Assessment Patient needs continued PT services  PT Problem List Decreased strength;Decreased activity tolerance;Decreased balance;Decreased mobility;Decreased cognition;Cardiopulmonary status limiting activity       PT Treatment Interventions DME instruction;Gait training;Stair training;Functional mobility training;Therapeutic activities;Therapeutic exercise;Balance training;Patient/family education    PT Goals (Current goals can be found in the Care Plan section)  Acute Rehab PT Goals Patient Stated Goal: Breathe better PT Goal Formulation: With patient Time For Goal Achievement: 02/20/18    Frequency Min 3X/week   Barriers to discharge        Co-evaluation               AM-PAC PT "6 Clicks" Daily Activity  Outcome Measure Difficulty turning over in bed (including adjusting bedclothes, sheets and blankets)?: None Difficulty moving from lying on back to sitting on the side of the bed? : Unable Difficulty sitting down on and standing up from a chair with arms (e.g., wheelchair, bedside commode, etc,.)?: A Little Help needed moving to and from a bed to  chair (including a wheelchair)?: A Little Help needed walking in hospital room?: A Little Help needed climbing 3-5 steps with a railing? : A Little 6 Click Score: 17    End of Session Equipment Utilized During Treatment: Gait belt;Oxygen Activity Tolerance: Patient limited by fatigue Patient left: in chair;with call bell/phone within reach;with chair alarm set Nurse Communication: Mobility status PT Visit Diagnosis: Other abnormalities of gait and mobility (R26.89)    Time: 0630-1601 PT Time Calculation (min) (ACUTE ONLY): 42 min   Charges:   PT Evaluation $PT Eval Moderate Complexity: 1 Mod PT Treatments $Therapeutic Activity: 23-37 mins   PT G Codes:       Mabeline Caras, PT, DPT Acute Rehab Services  Pager: [place] 02/06/2018, 9:56 AM

## 2018-02-06 NOTE — Discharge Summary (Signed)
Physician Discharge Summary  BHARGAV BARBARO BMW:[id number] DOB: 10/31/1940 DOA: 02/03/2018  PCP: [facility]  Admit date: 02/03/2018 Discharge date: 02/06/2018  Time spent: 35 minutes  Recommendations for Outpatient Follow-up:  1. PCP at [facility] in 1 week, needs CBC at FU, advised to resume Eliquis in 5days 6/9 2. Home health PT/RN   Discharge Diagnoses:    Diverticular bleed   Acute blood loss anemia   Chronic resp failure on 3-4L home O2 at baseline   Chronic diastolic CHF   Diabetes mellitus ([place])   ATRIAL FIBRILLATION   Diverticulosis of colon   COPD ([place])   GI bleed   Discharge Condition: improved  Diet recommendation: low sodium heart healthy  Filed Weights   02/03/18 2140  Weight: 79.4 kg (175 lb)    History of present illness:  CarlHawksis a77 y.o.chronically ill male,with history of COPD, chronic respiratory failure on chronic home O2 4 L/min, chronic diastolic CHF, dysphagia with recurrent aspiration, atrial fibrillation on anticoagulation with Eliquis was brought to the hospital after patient developed rectal bleeding for [facility] Course:   Diverticular bleed -had 2days of painless hematochezia prior to admission -most likely diverticular, no further bleeding episodes since admission -unfortunately hemoglobin has drifted down a little, suspect still re-equilibrating -transfused 2 units PRBC this admission due to symptomatic anemia -last colonoscopy in 2008 with diverticulosis -held Eliquis, s/p Kcentra on admission -advised to resume Eliquis after 5days -recheck CBC at FU with PCP in 1 week -recommend non urgent GI FU to evaluate for repeat Colonoscopy, seen by [facility] Dr.Kaplan in 2008.    Acute blood loss anemia -due to hematochezia -Hemoglobin appears to have drifted down a little, suspect this is re- equilibrating -transfused 2units of blood, Hb improved, no bleeding since admission   acute on chronic diastolic  CHF -initially was vol overloaded due to anemia -given IV lasix and PRBC transfusion  -Volume status improved, PO lasix resumed   COPD/CHronic resp failure -on 4L Home O2 -no wheezing, nebs PRN -resumed home dose of prednisone 10mg  daily    Diabetes mellitus ([place])  -resumed lantus and glipizide at DC   P.Afib -rate controlled, resumed cardizem -eliquis held, due to hematochezia -restart this in 5days, pt aware of higher CVA/TIA risk while off anticoagulation  Dysphagia -on regular diet and thin liquis following last SLP eval in April -PO intake is poor, no overt aspiration noted     Discharge Exam: Vitals:   02/06/18 1352 02/06/18 1602  BP: 114/66 110/73  Pulse: 74 66  Resp: 15 18  Temp: 99.7 F (37.6 C) 98.3 F (36.8 C)  SpO2: 99% 96%    General: AAOx3 Cardiovascular: S1S2/RRR Respiratory: few scattered ronchi, no wheezes  Discharge Instructions   Discharge Instructions    Diet - low sodium heart healthy   Complete by:  As directed    Increase activity slowly   Complete by:  As directed      Allergies as of 02/06/2018      Reactions   Procaine Other (See Comments), Shortness Of Breath   Syncope   Procaine Hcl Anaphylaxis, Swelling      Medication List    STOP taking these medications   aspirin 325 MG EC tablet   ibuprofen 200 MG tablet Commonly known as:  ADVIL,MOTRIN   losartan 25 MG tablet Commonly known as:  COZAAR     TAKE these medications   acetaminophen 650 MG CR tablet Commonly known as:  TYLENOL Take  650 mg by mouth every 8 (eight) hours as needed for pain.   albuterol (2.5 MG/3ML) 0.083% nebulizer solution Commonly known as:  PROVENTIL Take 3 mLs (2.5 mg total) by nebulization every 4 (four) hours as needed for wheezing or shortness of breath.   ALPRAZolam 0.25 MG tablet Commonly known as:  XANAX Take 1 tablet (0.25 mg total) by mouth 3 (three) times daily as needed for anxiety.   apixaban 5 MG Tabs tablet Commonly  known as:  ELIQUIS Take 1 tablet (5 mg total) by mouth 2 (two) times daily. Restart after 5days on 6/9 Start taking on:  02/11/2018 What changed:    additional instructions  These instructions start on 02/11/2018. If you are unsure what to do until then, ask your doctor or other care provider.   cyclobenzaprine 10 MG tablet Commonly known as:  FLEXERIL Take 5 mg by mouth at bedtime.   diltiazem 300 MG 24 hr capsule Commonly known as:  TIAZAC Take 1 capsule (300 mg total) by mouth daily.   diphenhydrAMINE 25 MG tablet Commonly known as:  BENADRYL Take 1 tablet by mouth at bedtime.   docusate sodium 100 MG capsule Commonly known as:  COLACE Take 100 mg by mouth daily.   Fluticasone-Salmeterol 500-50 MCG/DOSE Aepb Commonly known as:  ADVAIR DISKUS Inhale 1 puff into the lungs 2 (two) times daily. What changed:    when to take this  reasons to take this   furosemide 40 MG tablet Commonly known as:  LASIX Take 1 tablet (40 mg total) by mouth daily.   glipiZIDE 5 MG 24 hr tablet Commonly known as:  GLUCOTROL XL Take 1 tablet (5 mg total) by mouth daily.   insulin aspart 100 UNIT/ML FlexPen Commonly known as:  NOVOLOG Check CBG's there etimes daily before meals. For CBG less than 150: 0 Units For CBG 150-200: 2 Units SQ For CBG 200-250: 4 Units SQ For CBG 250-300: 6 Units SQ For CBG 300-350: 8 Units SQ and call PCP for further instructions What changed:    how much to take  how to take this  when to take this  additional instructions   insulin glargine 100 unit/mL Sopn Commonly known as:  LANTUS Inject 0.25 mLs (25 Units total) into the skin daily.   Melatonin 3 MG Tabs Take 1 tablet by mouth at bedtime.   metoCLOPramide 10 MG tablet Commonly known as:  REGLAN Take 10 mg by mouth every 6 (six) hours as needed for nausea.   montelukast 10 MG tablet Commonly known as:  SINGULAIR Take 10 mg by mouth at bedtime.   morphine CONCENTRATE 10 mg / 0.5 ml  concentrated solution Take 0.25 mLs by mouth every 3 (three) hours as needed for shortness of breath.   omeprazole 40 MG capsule Commonly known as:  PRILOSEC Take 40 mg by mouth daily.   rosuvastatin 20 MG tablet Commonly known as:  CRESTOR Take 1 tablet (20 mg total) by mouth daily.   tiotropium 18 MCG inhalation capsule Commonly known as:  SPIRIVA HANDIHALER Place 1 capsule (18 mcg total) into inhaler and inhale daily.   tiZANidine 4 MG tablet Commonly known as:  ZANAFLEX Take 4 mg by mouth 3 (three) times daily.   vitamin B-12 500 MCG tablet Commonly known as:  CYANOCOBALAMIN Take 500 mcg by mouth 2 (two) times daily.      Allergies  Allergen Reactions  . Procaine Other (See Comments) and Shortness Of Breath    Syncope  .  Procaine Hcl Anaphylaxis and Swelling      The results of significant diagnostics from this hospitalization (including imaging, microbiology, ancillary and laboratory) are listed below for reference.    Significant Diagnostic Studies: Dg Chest Portable 1 View  Result Date: 02/03/2018 CLINICAL DATA:  GI bleed, shortness of breath. Recent hospitalization. EXAM: PORTABLE CHEST 1 VIEW COMPARISON:  Chest radiograph Jan 24, 2018 FINDINGS: Cardiomediastinal silhouette is normal. Calcified aortic arch. Persistent LEFT lung base patchy airspace opacity. No pleural effusion. No pneumothorax. Soft tissue planes and included osseous structures are nonsuspicious. IMPRESSION: Persistent LEFT lung base atelectasis, scarring or pneumonia. Aortic Atherosclerosis (ICD10-I70.0). Electronically Signed   By: Elon Alas M.D.   On: 02/03/2018 22:35    Microbiology: Recent Results (from the past 240 hour(s))  MRSA PCR Screening     Status: None   Collection Time: 02/04/18  3:45 AM  Result Value Ref Range Status   MRSA by PCR NEGATIVE NEGATIVE Final    Comment:        The GeneXpert MRSA Assay (FDA approved for NASAL specimens only), is one component of  a comprehensive MRSA colonization surveillance program. It is not intended to diagnose MRSA infection nor to guide or monitor treatment for MRSA infections. Performed at [facility], [place] [address], [place], [place] [address]      Labs: Basic Metabolic Panel: Recent Labs  Lab 02/03/18 2158 02/04/18 0433 02/06/18 0209 02/06/18 1558  NA 139 139 141 140  K 4.2 3.2* 2.8* 3.7  CL 92* 94* 94* 96*  CO2 38* 36* 40* 35*  GLUCOSE 324* 147* 143* 199*  BUN 41* 33* 17 22*  CREATININE 1.06 0.84 0.69 0.73  CALCIUM 7.9* 7.9* 8.1* 8.5*   Liver Function Tests: Recent Labs  Lab 02/03/18 2158 02/04/18 0433  AST 18 14*  ALT 15* 16*  ALKPHOS 41 44  BILITOT 0.4 0.4  PROT 4.6* 4.8*  ALBUMIN 2.4* 2.6*   No results for input(s): LIPASE, AMYLASE in the last 168 hours. No results for input(s): AMMONIA in the last 168 hours. CBC: Recent Labs  Lab 02/04/18 0433 02/04/18 1856 02/05/18 0800 02/05/18 1319 02/06/18 0209 02/06/18 1558  WBC 14.6* 15.4* 11.6*  --  11.9* 11.3*  HGB 8.7* 9.7* 8.2* 8.0* 8.3* 9.4*  HCT 28.1* 30.0* 25.3* 25.2* 25.8* 28.9*  MCV 83.1 82.6 82.7  --  83.5 84.0  PLT 267 269 238  --  227 235   Cardiac Enzymes: No results for input(s): CKTOTAL, CKMB, CKMBINDEX, TROPONINI in the last 168 hours. BNP: BNP (last 3 results) Recent Labs    06/28/17 0909 11/17/17 1231 12/26/17 1335  BNP 88.0 87.0 119.0*    ProBNP (last 3 results) No results for input(s): PROBNP in the last 8760 hours.  CBG: Recent Labs  Lab 02/05/18 1716 02/05/18 2304 02/06/18 0423 02/06/18 1203 02/06/18 1558  GLUCAP 306* 199* 139* 484* 173*       Signed:  Domenic Polite MD.  [facility] 02/06/2018, 6:00 PM

## 2018-02-06 NOTE — Progress Notes (Signed)
Received report stating that pt had recent D/C orders but family not coming to get him. While I was in the room talking about patient about why he was hesitant on D/C he stated he does not feel ready. While talking the son walks in about coming to get him? Then he realizes that he does not have patients home O2. Pt has 45 min drive and on chronic 3L. Son stated that he can not go get it and come back he does not have the gas money. Paged Baltazar Najjar to make aware of the situation.

## 2018-02-06 NOTE — Progress Notes (Signed)
Initial Nutrition Assessment  DOCUMENTATION CODES:   Not applicable  INTERVENTION:    Boost Breeze po TID, each supplement provides 250 kcal and 9 grams of protein  NUTRITION DIAGNOSIS:   Inadequate oral intake related to poor appetite as evidenced by per patient/family report  GOAL:   Patient will meet greater than or equal to 90% of their needs  MONITOR:   PO intake, Supplement acceptance, Labs, Skin, Weight trends, I & O's  REASON FOR ASSESSMENT:   Malnutrition Screening Tool  ASSESSMENT:   77 y.o. Male, with history of COPD, chronic respiratory failure on chronic home O2 4 L/min, chronic diastolic CHF, dysphagia with recurrent aspiration, atrial fibrillation on anticoagulation with Eliquis was brought to the hospital after patient developed rectal bleeding for 1-2days.  RD briefly spoke with pt at bedside. RN performing care at time visit. Pt reports his appetite isn't good. PO intake poor at 20% per flowsheet records. Pt's breakfast meal was on his tray table but he ate very little of it.  He reveals PTA his family members were trying to encourage him to eat. Pt was eating food items such as soup and a sandwich. States "they made me eat". He does not know if he's lost weight or not. UBW is 178 lbs.  Labs and medications reviewed. K 2.8 (L). CBG's [phone].  NUTRITION - FOCUSED PHYSICAL EXAM:  Unable to complete at time of visit.  Diet Order:   Diet Order           Diet Heart Room service appropriate? Yes; Fluid consistency: Thin  Diet effective now         EDUCATION NEEDS:   No education needs have been identified at this time  Skin:  Skin Assessment: Reviewed RN Assessment  Last BM:  6/2   Intake/Output Summary (Last 24 hours) at 02/06/2018 1524 Last data filed at 02/06/2018 1407 Gross per 24 hour  Intake 434 ml  Output 2050 ml  Net -1616 ml   Height:   Ht Readings from Last 1 Encounters:  02/03/18 5\' 5"  (1.651 m)   Weight:   Wt Readings  from Last 1 Encounters:  02/03/18 175 lb (79.4 kg)   BMI:  Body mass index is 29.12 kg/m.  Estimated Nutritional Needs:   Kcal:  1800-2000  Protein:  90-105 gm  Fluid:  1.8-2.0 L  Arthur Holms, RD, LDN Pager #: [phone] After-Hours Pager #: [phone]

## 2018-02-07 LAB — TYPE AND SCREEN
ABO/RH(D): A POS
Antibody Screen: NEGATIVE
UNIT DIVISION: 0
Unit division: 0

## 2018-02-07 LAB — CBC
HCT: 27.2 % — ABNORMAL LOW (ref 39.0–52.0)
Hemoglobin: 9.1 g/dL — ABNORMAL LOW (ref 13.0–17.0)
MCH: 27.6 pg (ref 26.0–34.0)
MCHC: 33.5 g/dL (ref 30.0–36.0)
MCV: 82.4 fL (ref 78.0–100.0)
Platelets: UNDETERMINED 10*3/uL (ref 150–400)
RBC: 3.3 MIL/uL — ABNORMAL LOW (ref 4.22–5.81)
RDW: 16.1 % — AB (ref 11.5–15.5)
WBC: 9.2 10*3/uL (ref 4.0–10.5)

## 2018-02-07 LAB — BPAM RBC
BLOOD PRODUCT EXPIRATION DATE: 201906252359
Blood Product Expiration Date: 201906142359
ISSUE DATE / TIME: 201906021309
ISSUE DATE / TIME: 201906041008
UNIT TYPE AND RH: 6200
UNIT TYPE AND RH: 6200

## 2018-02-07 LAB — GLUCOSE, CAPILLARY
GLUCOSE-CAPILLARY: 202 mg/dL — AB (ref 65–99)
GLUCOSE-CAPILLARY: 320 mg/dL — AB (ref 65–99)

## 2018-02-07 NOTE — Care Management Note (Signed)
Case Management Note  Patient Details  Name: Andrew [name] MRN: [id number] Date of Birth: [birth year]  Subjective/Objective:  From home with daughter, presents with GIB, has chronic chf, copd, patient is active with [facility] for [facility], [place], will need resumption orders prior to dc.   6/5 Tomi Bamberger RN, BSN - For [employer] home today, Butch Penny with [facility] notified.                    Action/Plan: DC home with [facility] services resumed.   Expected Discharge Date:  02/07/18               Expected Discharge Plan:  [place]  In-House Referral:     Discharge planning Services  CM Consult  Post Acute Care Choice:  Resumption of Svcs/PTA Provider Choice offered to:     DME Arranged:    DME Agency:     HH Arranged:  RN, PT [place] Agency:  [place]  Status of Service:  Completed, signed off  If discussed at [place] of Stay Meetings, dates discussed:    Additional Comments:  Zenon Mayo, RN 02/07/2018, 9:20 AM

## 2018-02-07 NOTE — Progress Notes (Signed)
Physical Therapy Treatment Patient Details Name: Andrew [name] MRN: [id number] DOB: Feb 07, 1941 Today's Date: 02/07/2018    History of Present Illness [name] is a 77 y.o. male admitted 02/03/18 after developing rectal bleeding and SOB; worked up for GIB and acute on chronic diastolic CHF. PMH includes COPD, chronic respiratory failure (4L home O2), CHF, dysphagia, a-fib (on Eliquis).    [name] Comments    [name] progressing towards goals, however [name] is still limited by increased SoB with movement, and fatigue. [name] supervision for bed mobility, min guard for transfers, modA for LE dressing, min guard for ambulation. [name] d/c home this afternoon and will be well served with HHPT to improve mobility in his home environment.      Follow Up Recommendations  Home health [name];Supervision/Assistance - 24 hour     Equipment Recommendations  None recommended by [name]       Precautions / Restrictions Precautions Precautions: Fall Restrictions Weight Bearing Restrictions: No    Mobility  Bed Mobility Overal bed mobility: Needs Assistance Bed Mobility: Supine to Sit     Supine to sit: Supervision;HOB elevated        Transfers Overall transfer level: Needs assistance   Transfers: Sit to/from Stand;Stand Pivot Transfers Sit to Stand: Min guard Stand pivot transfers: Min guard       General transfer comment: min guard for sit>stand with cues for hand placement on RW, x 2 for putting on pants  Ambulation/Gait Ambulation/Gait assistance: Min guard Ambulation Distance (Feet): 8 Feet Assistive device: Rolling walker (2 wheeled) Gait Pattern/deviations: Step-through pattern;Decreased stride length Gait velocity: Decreased Gait velocity interpretation: <1.8 ft/sec, indicate of risk for recurrent falls General Gait Details: min guard for safety, slow ambulation to w/c for discharge       Balance Overall balance assessment: Needs assistance   Sitting balance-Leahy Scale: Fair       Standing  balance-Leahy Scale: Poor Standing balance comment: Reliant on UE support to maintain standing balance                            Cognition Arousal/Alertness: Awake/alert Behavior During Therapy: Flat affect Overall Cognitive Status: History of cognitive impairments - at baseline Area of Impairment: Attention;Memory;Following commands;Awareness;Problem solving                   Current Attention Level: Selective Memory: Decreased short-term memory Following Commands: Follows one step commands with increased time   Awareness: Emergent Problem Solving: Slow processing;Difficulty sequencing;Requires verbal cues General Comments: Likely baseline cognition         General Comments General comments (skin integrity, edema, etc.): VSS, Daughter present to transport father home. [name] on 4L O2 via nasal cannula.       Pertinent Vitals/Pain  No complaints of pain.    Home Living Family/patient expects to be discharged to:: Private residence Living Arrangements: Children Available Help at Discharge: Family;Available 24 hours/day;Home health Type of Home: House Home Access: Ramped entrance   Home Layout: One level Home Equipment: [place] - 4 wheels;Shower seat;Wheelchair - manual;Bedside commode      Prior Function Level of Independence: Needs assistance  Gait / Transfers Assistance Needed: Amb short household distances with rollator. Does not drive ADL's / Homemaking Assistance Needed: [name] reports "home health nurse" comes 5 days/wk for 3 hrs to assist with bathing (sponge baths), dressing, and household tasks. Daughter also assists with all aspects of care Comments: patient no longer driving and is not a  community ambulator, uses a rollator for mobility in his home primarily   [name] Goals (current goals can now be found in the care plan section) Acute Rehab [name] Goals Patient Stated Goal: Breathe better [name] Goal Formulation: With patient Time For Goal Achievement:  02/20/18 Progress towards [name] goals: Progressing toward goals    Frequency    Min 3X/week      [name] Plan Current plan remains appropriate       AM-PAC [name] "6 Clicks" Daily Activity  Outcome Measure  Difficulty turning over in bed (including adjusting bedclothes, sheets and blankets)?: None Difficulty moving from lying on back to sitting on the side of the bed? : A Little Difficulty sitting down on and standing up from a chair with arms (e.g., wheelchair, bedside commode, etc,.)?: Unable Help needed moving to and from a bed to chair (including a wheelchair)?: A Little Help needed walking in hospital room?: A Little Help needed climbing 3-5 steps with a railing? : A Lot 6 Click Score: 16    End of Session Equipment Utilized During Treatment: Gait belt;Oxygen Activity Tolerance: Patient limited by fatigue Patient left: Other (comment)(in w/c for d/c, NT wheeled out)   [name] Visit Diagnosis: Other abnormalities of gait and mobility (R26.89)     Time: 8413-2440 [name] Time Calculation (min) (ACUTE ONLY): 23 min  Charges:  $Therapeutic Activity: 23-37 mins                    G Codes:       Andrew [name] B. Migdalia Andrew [name], DPT Acute Rehabilitation  [phone] Pager [phone]     Andrew [name] 02/07/2018, 4:42 PM

## 2018-02-07 NOTE — Progress Notes (Signed)
Inpatient Diabetes Program Recommendations  AACE/ADA: New Consensus Statement on Inpatient Glycemic Control (2015)  Target Ranges:  Prepandial:   less than 140 mg/dL      Peak postprandial:   less than 180 mg/dL (1-2 hours)      Critically ill patients:  140 - 180 mg/dL   Lab Results  Component Value Date   GLUCAP 320 (H) 02/07/2018   HGBA1C 8.3 (H) 02/04/2018    Review of Glycemic Control Results for Andrew [name], Andrew [name] (MRN [id number]) as of 02/07/2018 15:47  Ref. Range 02/06/2018 15:58 02/06/2018 21:47 02/07/2018 04:29 02/07/2018 10:34  Glucose-Capillary Latest Ref Range: 65 - 99 mg/dL 173 (H) 146 (H) 202 (H) 320 (H)    Diabetes history: Type 2 DM Outpatient Diabetes medications: Glipizide 5mg  QD, Novolog 0-8 units SSI TID, Lantus 25 units QD Current orders for Inpatient glycemic control: Lantus 20 units QHS, Novolog 0-9 units Q6H  Inpatient Diabetes Program Recommendations:    In the event patient remains inpatient, consider Novolog 3 units TID (assuming that patient is consuming >50%) and consider switching correction Novolog 0-9 units to TID and Novolog 0-5 units QHS.  Thanks, Bronson Curb, MSN, RNC-OB Diabetes Coordinator [phone] (8a-5p)

## 2018-02-07 NOTE — Progress Notes (Signed)
Occupational Therapy Evaluation Patient Details Name: Andrew [name] MRN: [id number] DOB: 22-Jun-1941 Today's Date: 02/07/2018    History of Present Illness Pt is a 77 y.o. male admitted 02/03/18 after developing rectal bleeding and SOB; worked up for GIB and acute on chronic diastolic CHF. PMH includes COPD, chronic respiratory failure (4L home O2), CHF, dysphagia, a-fib (on Eliquis).   Clinical Impression   PTA, pt lived at home with family and had a PCA daily to assist with ADL. Pt reports he has had a general decline inf function. Recommend follow up with [facility] if available to maximize independence with ADL and functional mobility for ADL. Acute OT signing off.  VSS during session.    Follow Up Recommendations  Home health OT;Supervision/Assistance - 24 hour    Equipment Recommendations  None recommended by OT    Recommendations for Other Services       Precautions / Restrictions Precautions Precautions: Fall Restrictions Weight Bearing Restrictions: No      Mobility Bed Mobility Overal bed mobility: Needs Assistance Bed Mobility: Supine to Sit     Supine to sit: Supervision;HOB elevated        Transfers Overall transfer level: Needs assistance   Transfers: Sit to/from Stand;Stand Pivot Transfers Sit to Stand: Min guard Stand pivot transfers: Min guard            Balance Overall balance assessment: Needs assistance   Sitting balance-Leahy Scale: Fair       Standing balance-Leahy Scale: Poor Standing balance comment: Reliant on UE support to maintain standing balance; pt able to stand ~30 sec before needing to sit due to feeling SOB                           ADL either performed or assessed with clinical judgement   ADL Overall ADL's : Needs assistance/impaired                                     Functional mobility during ADLs: Min guard;Cueing for safety General ADL Comments: Pt staes his home care nurse helps him with  bathing daily. Family assists with dressing. Pt is able to feed himself.     Vision         Perception     Praxis      Pertinent Vitals/Pain       Hand Dominance Right   Extremity/Trunk Assessment Upper Extremity Assessment Upper Extremity Assessment: Generalized weakness   Lower Extremity Assessment Lower Extremity Assessment: Defer to PT evaluation   Cervical / Trunk Assessment Cervical / Trunk Assessment: Kyphotic   Communication Communication Communication: HOH   Cognition Arousal/Alertness: Awake/alert Behavior During Therapy: Flat affect Overall Cognitive Status: No family/caregiver present to determine baseline cognitive functioning Area of Impairment: Attention;Memory;Following commands;Awareness;Problem solving                   Current Attention Level: Selective Memory: Decreased short-term memory Following Commands: Follows one step commands with increased time   Awareness: Emergent Problem Solving: Slow processing;Difficulty sequencing;Requires verbal cues General Comments: Likely baseline cognition   General Comments       Exercises     Shoulder Instructions      Home Living Family/patient expects to be discharged to:: Private residence Living Arrangements: Children Available Help at Discharge: Family;Available 24 hours/day;Home health Type of Home: House Home Access: Ramped entrance  Home Layout: One level     Bathroom Shower/Tub: Teacher, early years/pre: Standard Bathroom Accessibility: Yes How Accessible: Accessible via walker Home Equipment: [place] - 4 wheels;Shower seat;Wheelchair - manual;Bedside commode          Prior Functioning/Environment Level of Independence: Needs assistance  Gait / Transfers Assistance Needed: Amb short household distances with rollator. Does not drive ADL's / Homemaking Assistance Needed: Pt reports "home health nurse" comes 5 days/wk for 3 hrs to assist with bathing (sponge  baths), dressing, and household tasks. 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, uses a rollator for mobility in his home primarily        OT Problem List: Decreased strength;Decreased activity tolerance;Impaired balance (sitting and/or standing);Decreased safety awareness;Decreased knowledge of use of DME or AE;Cardiopulmonary status limiting activity      OT Treatment/Interventions:      OT Goals(Current goals can be found in the care plan section) Acute Rehab OT Goals Patient Stated Goal: Breathe better OT Goal Formulation: All assessment and education complete, DC therapy  OT Frequency:     Barriers to D/C:            Co-evaluation              AM-PAC PT "6 Clicks" Daily Activity     Outcome Measure Help from another person eating meals?: None Help from another person taking care of personal grooming?: A Little Help from another person toileting, which includes using toliet, bedpan, or urinal?: A Little Help from another person bathing (including washing, rinsing, drying)?: A Lot Help from another person to put on and taking off regular upper body clothing?: A Little Help from another person to put on and taking off regular lower body clothing?: A Lot 6 Click Score: 17   End of Session Equipment Utilized During Treatment: Gait belt;Oxygen(4L) Nurse Communication: Mobility status  Activity Tolerance: Patient tolerated treatment well Patient left: in chair;with call bell/phone within reach;with chair alarm set  OT Visit Diagnosis: Unsteadiness on feet (R26.81);Muscle weakness (generalized) (M62.81)                Time: 5361-4431 OT Time Calculation (min): 14 min Charges:  OT General Charges $OT Visit: 1 Visit OT Evaluation $OT Eval Low Complexity: 1 Low G-Codes:     Maurie Boettcher, OT/L  OT Clinical Specialist [phone]   [facility] 02/07/2018, 2:27 PM

## 2018-03-07 ENCOUNTER — Other Ambulatory Visit: Payer: Self-pay

## 2018-03-07 ENCOUNTER — Encounter (HOSPITAL_COMMUNITY): Payer: Self-pay

## 2018-03-07 ENCOUNTER — Emergency Department (HOSPITAL_COMMUNITY): Payer: Medicare PPO

## 2018-03-07 ENCOUNTER — Inpatient Hospital Stay (HOSPITAL_COMMUNITY)
Admission: EM | Admit: 2018-03-07 | Discharge: 2018-03-11 | DRG: 190 | Disposition: A | Discharge status: Home Health | Payer: Medicare PPO | Attending: Family Medicine | Admitting: Family Medicine

## 2018-03-07 DIAGNOSIS — Z9109 Other allergy status, other than to drugs and biological substances: Secondary | ICD-10-CM

## 2018-03-07 DIAGNOSIS — J9601 Acute respiratory failure with hypoxia: Secondary | ICD-10-CM

## 2018-03-07 DIAGNOSIS — J441 Chronic obstructive pulmonary disease with (acute) exacerbation: Principal | ICD-10-CM | POA: Diagnosis present

## 2018-03-07 DIAGNOSIS — R0602 Shortness of breath: Secondary | ICD-10-CM | POA: Diagnosis not present

## 2018-03-07 DIAGNOSIS — Z9841 Cataract extraction status, right eye: Secondary | ICD-10-CM

## 2018-03-07 DIAGNOSIS — Z7951 Long term (current) use of inhaled steroids: Secondary | ICD-10-CM

## 2018-03-07 DIAGNOSIS — J9620 Acute and chronic respiratory failure, unspecified whether with hypoxia or hypercapnia: Secondary | ICD-10-CM | POA: Diagnosis present

## 2018-03-07 DIAGNOSIS — J9602 Acute respiratory failure with hypercapnia: Secondary | ICD-10-CM

## 2018-03-07 DIAGNOSIS — Z79899 Other long term (current) drug therapy: Secondary | ICD-10-CM

## 2018-03-07 DIAGNOSIS — J9622 Acute and chronic respiratory failure with hypercapnia: Secondary | ICD-10-CM | POA: Diagnosis present

## 2018-03-07 DIAGNOSIS — I110 Hypertensive heart disease with heart failure: Secondary | ICD-10-CM | POA: Diagnosis present

## 2018-03-07 DIAGNOSIS — K219 Gastro-esophageal reflux disease without esophagitis: Secondary | ICD-10-CM | POA: Diagnosis present

## 2018-03-07 DIAGNOSIS — Z9981 Dependence on supplemental oxygen: Secondary | ICD-10-CM

## 2018-03-07 DIAGNOSIS — I5032 Chronic diastolic (congestive) heart failure: Secondary | ICD-10-CM | POA: Diagnosis present

## 2018-03-07 DIAGNOSIS — Z8673 Personal history of transient ischemic attack (TIA), and cerebral infarction without residual deficits: Secondary | ICD-10-CM

## 2018-03-07 DIAGNOSIS — E119 Type 2 diabetes mellitus without complications: Secondary | ICD-10-CM

## 2018-03-07 DIAGNOSIS — I10 Essential (primary) hypertension: Secondary | ICD-10-CM | POA: Diagnosis present

## 2018-03-07 DIAGNOSIS — Z7901 Long term (current) use of anticoagulants: Secondary | ICD-10-CM

## 2018-03-07 DIAGNOSIS — T380X5A Adverse effect of glucocorticoids and synthetic analogues, initial encounter: Secondary | ICD-10-CM | POA: Diagnosis present

## 2018-03-07 DIAGNOSIS — Z87891 Personal history of nicotine dependence: Secondary | ICD-10-CM

## 2018-03-07 DIAGNOSIS — D638 Anemia in other chronic diseases classified elsewhere: Secondary | ICD-10-CM | POA: Diagnosis present

## 2018-03-07 DIAGNOSIS — J9621 Acute and chronic respiratory failure with hypoxia: Secondary | ICD-10-CM | POA: Diagnosis present

## 2018-03-07 DIAGNOSIS — Z9842 Cataract extraction status, left eye: Secondary | ICD-10-CM

## 2018-03-07 DIAGNOSIS — E871 Hypo-osmolality and hyponatremia: Secondary | ICD-10-CM | POA: Diagnosis present

## 2018-03-07 DIAGNOSIS — E876 Hypokalemia: Secondary | ICD-10-CM

## 2018-03-07 DIAGNOSIS — I2510 Atherosclerotic heart disease of native coronary artery without angina pectoris: Secondary | ICD-10-CM | POA: Diagnosis present

## 2018-03-07 DIAGNOSIS — I482 Chronic atrial fibrillation: Secondary | ICD-10-CM | POA: Diagnosis present

## 2018-03-07 DIAGNOSIS — E785 Hyperlipidemia, unspecified: Secondary | ICD-10-CM | POA: Diagnosis present

## 2018-03-07 DIAGNOSIS — I4891 Unspecified atrial fibrillation: Secondary | ICD-10-CM | POA: Diagnosis present

## 2018-03-07 DIAGNOSIS — E1165 Type 2 diabetes mellitus with hyperglycemia: Secondary | ICD-10-CM | POA: Diagnosis present

## 2018-03-07 LAB — BLOOD GAS, ARTERIAL
Acid-Base Excess: 22.1 mmol/L — ABNORMAL HIGH (ref 0.0–2.0)
Bicarbonate: 45.3 mmol/L — ABNORMAL HIGH (ref 20.0–28.0)
Drawn by: 382351
FIO2: 32
O2 Content: 3 L/min
O2 SAT: 93.3 %
PH ART: 7.508 — AB (ref 7.350–7.450)
Patient temperature: 37
pCO2 arterial: 60.2 mmHg — ABNORMAL HIGH (ref 32.0–48.0)
pO2, Arterial: 68.9 mmHg — ABNORMAL LOW (ref 83.0–108.0)

## 2018-03-07 LAB — CBC WITH DIFFERENTIAL/PLATELET
Basophils Absolute: 0 10*3/uL (ref 0.0–0.1)
Basophils Relative: 0 %
EOS ABS: 0 10*3/uL (ref 0.0–0.7)
EOS PCT: 0 %
HCT: 33.4 % — ABNORMAL LOW (ref 39.0–52.0)
Hemoglobin: 10.3 g/dL — ABNORMAL LOW (ref 13.0–17.0)
Lymphocytes Relative: 15 %
Lymphs Abs: 2.4 10*3/uL (ref 0.7–4.0)
MCH: 24.3 pg — AB (ref 26.0–34.0)
MCHC: 30.8 g/dL (ref 30.0–36.0)
MCV: 79 fL (ref 78.0–100.0)
MONOS PCT: 8 %
Monocytes Absolute: 1.2 10*3/uL — ABNORMAL HIGH (ref 0.1–1.0)
Neutro Abs: 12.1 10*3/uL — ABNORMAL HIGH (ref 1.7–7.7)
Neutrophils Relative %: 77 %
PLATELETS: 271 10*3/uL (ref 150–400)
RBC: 4.23 MIL/uL (ref 4.22–5.81)
RDW: 17.5 % — ABNORMAL HIGH (ref 11.5–15.5)
WBC: 15.7 10*3/uL — ABNORMAL HIGH (ref 4.0–10.5)

## 2018-03-07 MED ORDER — IPRATROPIUM-ALBUTEROL 0.5-2.5 (3) MG/3ML IN SOLN
3.0000 mL | Freq: Once | RESPIRATORY_TRACT | Status: AC
  Administered 2018-03-07: 3 mL via RESPIRATORY_TRACT
  Filled 2018-03-07: qty 3

## 2018-03-07 MED ORDER — FUROSEMIDE 10 MG/ML IJ SOLN
40.0000 mg | Freq: Once | INTRAMUSCULAR | Status: AC
  Administered 2018-03-07: 40 mg via INTRAVENOUS
  Filled 2018-03-07: qty 4

## 2018-03-07 NOTE — ED Provider Notes (Signed)
[facility] EMERGENCY DEPARTMENT Provider Note   CSN: [id number] Arrival date & time: 03/07/18  2258     History   Chief Complaint Chief Complaint  Patient presents with  . Shortness of Breath    HPI Andrew [name] is a 77 y.o. male.  Level 5 caveat for respiratory distress.  Patient brought in by EMS for respiratory distress and hypoxia in the 70s at home.  Does have history of COPD on home oxygen as well as diastolic CHF.  He is a poor historian.  States his been short of breath for about the past 3 days with a nonproductive cough.  He denies chest pain.  He states his leg swelling and abdominal swelling at baseline.  He received nebulizers and steroids via EMS with some improvement.  States compliance with his medications.  States he normally has a sleep propped up on multiple pillows and this is unchanged.  Denies any chest pain or abdominal pain currently.  He feels improved on arrival on nebulizer.  The history is provided by the patient and the EMS personnel. The history is limited by the condition of the patient.  Shortness of Breath  Associated symptoms include cough and leg swelling. Pertinent negatives include no fever, no headaches, no chest pain, no vomiting, no abdominal pain and no rash.    Past Medical History:  Diagnosis Date  . A-fib ([place])   . Abdominal pain   . Adenomatous colon polyp   . Arthritis   . Asthma   . CAD (coronary artery disease)   . Cancer ([place])    skin, nose  . Cataract    bilateral  . CHF (congestive heart failure) ([place])    patient reports  . Chronic LBP   . COPD (chronic obstructive pulmonary disease) ([place])   . Decreased libido   . Diverticulosis of colon   . Esophageal stricture   . Fatigue   . GERD (gastroesophageal reflux disease)   . History of small bowel obstruction   . Hyperlipidemia   . Hypertension   . Hypertrophy of prostate with urinary obstruction and other lower urinary tract symptoms (LUTS)   . Palpitations   .  Peri-rectal abscess   . Personal history of renal calculi   . Prostatitis, acute   . Stroke [facility])    pt reports  . Tobacco abuse     Patient Active Problem List   Diagnosis Date Noted  . GI bleed 02/03/2018  . Acute respiratory failure ([place]) 12/26/2017  . Aspiration pneumonia ([place]) 12/26/2017  . Dysphagia 12/26/2017  . Acute on chronic diastolic CHF (congestive heart failure) ([place]) 12/26/2017  . Acute on chronic respiratory failure with hypoxia ([place]) 11/17/2017  . COPD with exacerbation ([place]) 11/17/2017  . Hospital acquired PNA 05/14/2017  . COPD exacerbation ([place])   . HCAP (healthcare-associated pneumonia)   . Acute respiratory failure with hypoxia and hypercapnia ([place]) 04/12/2017  . Pneumonia 10/24/2016  . AAA (abdominal aortic aneurysm) ([place]) 01/04/2011  . Dizziness, nonspecific 12/26/2010  . UNSPECIFIED PERIPHERAL VASCULAR DISEASE 09/23/2010  . LEG CRAMPS 09/13/2010  . Diabetes mellitus ([place]) 02/11/2010  . CHRONIC OBSTRUCTIVE PULMONARY DISEASE, ACUTE EXACERBATION 02/11/2010  . OLECRANON BURSITIS, RIGHT 01/26/2010  . PENILE PAIN 12/29/2009  . ABNORMAL EJACULATION 05/05/2009  . LARYNGITIS, ACUTE 04/21/2009  . ATRIAL FIBRILLATION 04/08/2009  . PALPITATIONS 04/08/2009  . FATIGUE 03/31/2009  . LIBIDO, DECREASED 03/31/2009  . HYPERTROPHY PROSTATE W/UR OBST & OTH LUTS 03/25/2009  . ACUTE PROSTATITIS 03/25/2009  . SMALL BOWEL  OBSTRUCTION, HX OF 11/28/2007  . HLD (hyperlipidemia) 11/13/2007  . COPD 10/02/2007  . LOW BACK PAIN, CHRONIC 10/02/2007  . ADENOMATOUS COLONIC POLYP 05/22/2007  . ESOPHAGEAL STRICTURE 05/22/2007  . Diverticulosis of colon 05/22/2007  . TOBACCO ABUSE 05/08/2007  . Essential hypertension 05/08/2007  . CORONARY ARTERY DISEASE 05/08/2007  . ASTHMA 05/08/2007  . GERD 05/08/2007  . RENAL CALCULUS, HX OF 05/08/2007  . ABSCESS, PERIRECTAL, HX OF 05/08/2007  . ARTHRITIS, HX OF 05/08/2007  . PENILE PROSTHESIS 05/08/2007  . LAMINECTOMY, LUMBAR, HX OF  05/08/2007    Past Surgical History:  Procedure Laterality Date  . CATARACT EXTRACTION  04/2010   bilateral  . HERNIA REPAIR    . LUMBAR LAMINECTOMY     with Harrington rods  . PENILE PROSTHESIS PLACEMENT    . TONSILLECTOMY          Home Medications    Prior to Admission medications   Medication Sig Start Date End Date Taking? Authorizing Provider  acetaminophen (TYLENOL) 650 MG CR tablet Take 650 mg by mouth every 8 (eight) hours as needed for pain.    [provider]  albuterol (PROVENTIL) (2.5 MG/3ML) 0.083% nebulizer solution Take 3 mLs (2.5 mg total) by nebulization every 4 (four) hours as needed for wheezing or shortness of breath. 07/03/17   Arrien, Jimmy Picket, MD  ALPRAZolam Duanne Moron) 0.25 MG tablet Take 1 tablet (0.25 mg total) by mouth 3 (three) times daily as needed for anxiety. 07/03/17   Arrien, Jimmy Picket, MD  apixaban (ELIQUIS) 5 MG TABS tablet Take 1 tablet (5 mg total) by mouth 2 (two) times daily. Restart after 5days on 6/9 02/11/18 03/13/18  Domenic Polite, MD  cyclobenzaprine (FLEXERIL) 10 MG tablet Take 5 mg by mouth at bedtime.    [provider]  diltiazem (TIAZAC) 300 MG 24 hr capsule Take 1 capsule (300 mg total) by mouth daily. 07/03/17 02/05/18  Arrien, Jimmy Picket, MD  diphenhydrAMINE (BENADRYL) 25 MG tablet Take 1 tablet by mouth at bedtime.    [provider]  docusate sodium (COLACE) 100 MG capsule Take 100 mg by mouth daily.    [provider]  Fluticasone-Salmeterol (ADVAIR DISKUS) 500-50 MCG/DOSE AEPB Inhale 1 puff into the lungs 2 (two) times daily. Patient taking differently: Inhale 1 puff into the lungs 2 (two) times daily as needed (shortness of breath).  07/03/17 02/05/18  Arrien, Jimmy Picket, MD  furosemide (LASIX) 40 MG tablet Take 1 tablet (40 mg total) by mouth daily. 12/31/17   Johnson, Clanford L, MD  glipiZIDE (GLUCOTROL XL) 5 MG 24 hr tablet Take 1 tablet (5 mg total) by mouth daily. 12/30/17    Johnson, Clanford L, MD  insulin aspart (NOVOLOG) 100 UNIT/ML FlexPen Check CBG's there etimes daily before meals. For CBG less than 150: 0 Units For CBG 150-200: 2 Units SQ For CBG 200-250: 4 Units SQ For CBG 250-300: 6 Units SQ For CBG 300-350: 8 Units SQ and call PCP for further instructions Patient taking differently: Inject 0-8 Units into the skin See admin instructions. Check CBG's there etimes daily before meals. For CBG less than 150: 0 Units For CBG 150-200: 2 Units SQ For CBG 200-250: 4 Units SQ For CBG 250-300: 6 Units SQ For CBG 300-350: 8 Units SQ and call PCP for further instructions 07/03/17   Arrien, Jimmy Picket, MD  insulin glargine (LANTUS) 100 unit/mL SOPN Inject 0.25 mLs (25 Units total) into the skin daily. 12/30/17 02/05/18  Murlean Iba, MD  Melatonin 3 MG TABS Take 1 tablet by mouth at bedtime.    [provider]  metoCLOPramide (REGLAN) 10 MG tablet Take 10 mg by mouth every 6 (six) hours as needed for nausea.    [provider]  montelukast (SINGULAIR) 10 MG tablet Take 10 mg by mouth at bedtime.    [provider]  Morphine Sulfate (MORPHINE CONCENTRATE) 10 mg / 0.5 ml concentrated solution Take 0.25 mLs by mouth every 3 (three) hours as needed for shortness of breath. 01/06/18   [provider]  omeprazole (PRILOSEC) 40 MG capsule Take 40 mg by mouth daily.    [provider]  rosuvastatin (CRESTOR) 20 MG tablet Take 1 tablet (20 mg total) by mouth daily. 07/03/17   Arrien, Jimmy Picket, MD  tiotropium (SPIRIVA HANDIHALER) 18 MCG inhalation capsule Place 1 capsule (18 mcg total) into inhaler and inhale daily. 07/03/17 07/03/18  Arrien, Jimmy Picket, MD  tiZANidine (ZANAFLEX) 4 MG tablet Take 4 mg by mouth 3 (three) times daily.    [provider]  vitamin B-12 (CYANOCOBALAMIN) 500 MCG tablet Take 500 mcg by mouth 2 (two) times daily.    [provider]    Family History Family History    Problem Relation Age of Onset  . Heart disease Brother        CAD  . Diabetes Brother   . COPD Brother   . Emphysema Brother     Social History Social History   Tobacco Use  . Smoking status: Former Smoker    Packs/day: 1.00    Types: Cigarettes    Last attempt to quit: 01/18/2018    Years since quitting: 0.1  . Smokeless tobacco: Never Used  Substance Use Topics  . Alcohol use: No  . Drug use: No     Allergies   Procaine and Procaine hcl   Review of Systems Review of Systems  Constitutional: Negative for activity change, appetite change and fever.  HENT: Negative for congestion.   Eyes: Negative for visual disturbance.  Respiratory: Positive for cough and shortness of breath. Negative for chest tightness.   Cardiovascular: Positive for leg swelling. Negative for chest pain.  Gastrointestinal: Negative for abdominal pain, nausea and vomiting.  Genitourinary: Negative for dysuria, hematuria, testicular pain and urgency.  Musculoskeletal: Negative for arthralgias and myalgias.  Skin: Negative for rash.  Neurological: Negative for dizziness, weakness, light-headedness and headaches.    all other systems are negative except as noted in the HPI and PMH.    Physical Exam Updated Vital Signs BP (!) 145/88   Pulse (!) 56   Temp 98.2 F (36.8 C) (Oral)   Resp 20   Ht 5\' 5"  (1.651 m)   Wt 77.1 kg (170 lb)   SpO2 97%   BMI 28.29 kg/m   Physical Exam  Constitutional: He is oriented to person, place, and time. He appears well-developed and well-nourished. He appears distressed.  Moderate respiratory distress with increased work of breathing  HENT:  Head: Normocephalic and atraumatic.  Mouth/Throat: Oropharynx is clear and moist. No oropharyngeal exudate.  Eyes: Pupils are equal, round, and reactive to light. Conjunctivae and EOM are normal.  Neck: Normal range of motion. Neck supple.  No meningismus.  Cardiovascular: Normal rate, normal heart sounds and intact  distal pulses.  No murmur heard. Irregular tachycardia  Pulmonary/Chest: He is in respiratory distress. He has rales.  Diffuse rhonchi throughout with tachypnea and accessory muscle use.  Abdominal: Soft. He exhibits distension. There is  no tenderness. There is no rebound and no guarding.  Distended abdomen with reducible umbilical hernia.  Musculoskeletal: Normal range of motion. He exhibits edema. He exhibits no tenderness.  +2 edema pretibially  Neurological: He is alert and oriented to person, place, and time. No cranial nerve deficit. He exhibits normal muscle tone. Coordination normal.  No ataxia on finger to nose bilaterally. No pronator drift. 5/5 strength throughout. CN 2-12 intact.Equal grip strength. Sensation intact.   Skin: Skin is warm.  Psychiatric: He has a normal mood and affect. His behavior is normal.  Nursing note and vitals reviewed.    ED Treatments / Results  Labs (all labs ordered are listed, but only abnormal results are displayed) Labs Reviewed  CBC WITH DIFFERENTIAL/PLATELET - Abnormal; Notable for the following components:      Result Value   WBC 15.7 (*)    Hemoglobin 10.3 (*)    HCT 33.4 (*)    MCH 24.3 (*)    RDW 17.5 (*)    Neutro Abs 12.1 (*)    Monocytes Absolute 1.2 (*)    All other components within normal limits  BASIC METABOLIC PANEL - Abnormal; Notable for the following components:   Sodium 127 (*)    Potassium 2.1 (*)    Chloride 68 (*)    CO2 43 (*)    Glucose, Bld 202 (*)    BUN 26 (*)    Creatinine, Ser 1.45 (*)    Calcium 8.4 (*)    GFR calc non Af Amer 44 (*)    GFR calc Af Amer 51 (*)    Anion gap 16 (*)    All other components within normal limits  BRAIN NATRIURETIC PEPTIDE - Abnormal; Notable for the following components:   B Natriuretic Peptide 119.0 (*)    All other components within normal limits  BLOOD GAS, ARTERIAL - Abnormal; Notable for the following components:   pH, Arterial 7.508 (*)    pCO2 arterial 60.2 (*)      pO2, Arterial 68.9 (*)    Bicarbonate 45.3 (*)    Acid-Base Excess 22.1 (*)    All other components within normal limits  MAGNESIUM - Abnormal; Notable for the following components:   Magnesium 2.6 (*)    All other components within normal limits  BASIC METABOLIC PANEL - Abnormal; Notable for the following components:   Sodium 128 (*)    Potassium 2.3 (*)    Chloride 67 (*)    CO2 45 (*)    Glucose, Bld 208 (*)    BUN 27 (*)    Creatinine, Ser 1.38 (*)    Calcium 8.7 (*)    GFR calc non Af Amer 47 (*)    GFR calc Af Amer 55 (*)    Anion gap 16 (*)    All other components within normal limits  MRSA PCR SCREENING  TROPONIN I  PHOSPHORUS  MAGNESIUM  BASIC METABOLIC PANEL  CBC    EKG EKG Interpretation  Date/Time:  Wednesday March 07 2018 23:14:04 EDT Ventricular Rate:  94 PR Interval:    QRS Duration: 112 QT Interval:  409 QTC Calculation: 435 R Axis:   90 Text Interpretation:  Sinus rhythm Borderline intraventricular conduction delay Repol abnrm suggests ischemia, diffuse leads lateral ST depressions  Confirmed by Ezequiel Essex [phone]) on 03/07/2018 11:17:16 PM   Radiology Dg Chest Portable 1 View  Result Date: 03/07/2018 CLINICAL DATA:  Shortness of breath and cough for 3 days. EXAM: PORTABLE CHEST 1  VIEW COMPARISON:  02/03/2018 FINDINGS: Borderline heart size and pulmonary vascularity are likely normal for technique. Probable atelectasis in the left lung base similar to previous study. No focal consolidation. No blunting of costophrenic angles. No pneumothorax. Mediastinal contours appear intact. IMPRESSION: Atelectasis in the left lung base similar to previous study. No focal consolidation. Electronically Signed   By: Lucienne Capers M.D.   On: 03/07/2018 23:41    Procedures Procedures (including critical care time)  Medications Ordered in ED Medications  ipratropium-albuterol (DUONEB) 0.5-2.5 (3) MG/3ML nebulizer solution 3 mL (has no administration in time  range)  furosemide (LASIX) injection 40 mg (has no administration in time range)     Initial Impression / Assessment and Plan / ED Course  I have reviewed the triage vital signs and the nursing notes.  Pertinent labs & imaging results that were available during my care of the patient were reviewed by me and considered in my medical decision making (see chart for details).    Patient presents with respiratory distress from EMS with hypoxia at home.  Work of breathing has improved after receiving nebulizer and Solu-Medrol from EMS.  Wears 3 L of oxygen.  Denies chest pain.  Patient with coarse rhonchi on exam.  He will be given some IV Lasix given his peripheral edema and crackles. ABG shows mixed alkalosis with some CO2 retention not far from patient's baseline.  Mental status is normal and will hold on BiPAP at this time as do not want to make alkalosis worse.  Labs show severe hypokalemia which is confirmed on repeat.  Patient is given IV magnesium and IV potassium supplementation.  IV Lasix given with some improvement in breathing.  Patient appears more comfortable and accessory muscle use is improved. ST depressions on EKG noted and likely secondary to hypokalemia.  Troponin negative and patient denies chest pain.  Pulmonary embolism considered less likely as patient is on Eliquis.  Patient appears stable on nasal cannula.  Plan admission to the hospital for likely multifactorial respiratory failure with COPD and CHF and severe hypokalemia.  Discussed with Dr. Olevia Bowens.  CRITICAL CARE Performed by: Ezequiel Essex Total critical care time: 45 minutes Critical care time was exclusive of separately billable procedures and treating other patients. Critical care was necessary to treat or prevent imminent or life-threatening deterioration. Critical care was time spent personally by me on the following activities: development of treatment plan with patient and/or surrogate as well as nursing,  discussions with consultants, evaluation of patient's response to treatment, examination of patient, obtaining history from patient or surrogate, ordering and performing treatments and interventions, ordering and review of laboratory studies, ordering and review of radiographic studies, pulse oximetry and re-evaluation of patient's condition.  Final Clinical Impressions(s) / ED Diagnoses   Final diagnoses:  Acute respiratory failure with hypoxia and hypercapnia ([place])  Hypokalemia    ED Discharge Orders    None       Ezequiel Essex, MD 03/08/18 [phone]

## 2018-03-07 NOTE — ED Triage Notes (Signed)
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  EMS gave 2 nebs and 125 solumedrol im.  Pt continues to smoke.  Pt uses 3 liters [place] oxygen at home

## 2018-03-07 NOTE — ED Triage Notes (Signed)
Pt arrived via [facility] from home c/o SOB X3 days, non-productive cough, denies chest pain and has bilateral peripheral swelling in legs.

## 2018-03-08 DIAGNOSIS — E876 Hypokalemia: Secondary | ICD-10-CM | POA: Diagnosis present

## 2018-03-08 DIAGNOSIS — Z87891 Personal history of nicotine dependence: Secondary | ICD-10-CM | POA: Diagnosis not present

## 2018-03-08 DIAGNOSIS — Z79899 Other long term (current) drug therapy: Secondary | ICD-10-CM | POA: Diagnosis not present

## 2018-03-08 DIAGNOSIS — I5032 Chronic diastolic (congestive) heart failure: Secondary | ICD-10-CM | POA: Diagnosis present

## 2018-03-08 DIAGNOSIS — Z8673 Personal history of transient ischemic attack (TIA), and cerebral infarction without residual deficits: Secondary | ICD-10-CM | POA: Diagnosis not present

## 2018-03-08 DIAGNOSIS — J9620 Acute and chronic respiratory failure, unspecified whether with hypoxia or hypercapnia: Secondary | ICD-10-CM | POA: Diagnosis present

## 2018-03-08 DIAGNOSIS — I10 Essential (primary) hypertension: Secondary | ICD-10-CM | POA: Diagnosis not present

## 2018-03-08 DIAGNOSIS — K219 Gastro-esophageal reflux disease without esophagitis: Secondary | ICD-10-CM | POA: Diagnosis present

## 2018-03-08 DIAGNOSIS — I2510 Atherosclerotic heart disease of native coronary artery without angina pectoris: Secondary | ICD-10-CM | POA: Diagnosis present

## 2018-03-08 DIAGNOSIS — Z794 Long term (current) use of insulin: Secondary | ICD-10-CM | POA: Diagnosis not present

## 2018-03-08 DIAGNOSIS — E871 Hypo-osmolality and hyponatremia: Secondary | ICD-10-CM | POA: Diagnosis present

## 2018-03-08 DIAGNOSIS — J9621 Acute and chronic respiratory failure with hypoxia: Secondary | ICD-10-CM | POA: Diagnosis not present

## 2018-03-08 DIAGNOSIS — Z9109 Other allergy status, other than to drugs and biological substances: Secondary | ICD-10-CM | POA: Diagnosis not present

## 2018-03-08 DIAGNOSIS — J9622 Acute and chronic respiratory failure with hypercapnia: Secondary | ICD-10-CM | POA: Diagnosis not present

## 2018-03-08 DIAGNOSIS — T380X5A Adverse effect of glucocorticoids and synthetic analogues, initial encounter: Secondary | ICD-10-CM | POA: Diagnosis present

## 2018-03-08 DIAGNOSIS — Z9841 Cataract extraction status, right eye: Secondary | ICD-10-CM | POA: Diagnosis not present

## 2018-03-08 DIAGNOSIS — Z9842 Cataract extraction status, left eye: Secondary | ICD-10-CM | POA: Diagnosis not present

## 2018-03-08 DIAGNOSIS — Z7951 Long term (current) use of inhaled steroids: Secondary | ICD-10-CM | POA: Diagnosis not present

## 2018-03-08 DIAGNOSIS — Z9981 Dependence on supplemental oxygen: Secondary | ICD-10-CM | POA: Diagnosis not present

## 2018-03-08 DIAGNOSIS — Z7901 Long term (current) use of anticoagulants: Secondary | ICD-10-CM | POA: Diagnosis not present

## 2018-03-08 DIAGNOSIS — D638 Anemia in other chronic diseases classified elsewhere: Secondary | ICD-10-CM | POA: Diagnosis present

## 2018-03-08 DIAGNOSIS — I480 Paroxysmal atrial fibrillation: Secondary | ICD-10-CM | POA: Diagnosis not present

## 2018-03-08 DIAGNOSIS — J441 Chronic obstructive pulmonary disease with (acute) exacerbation: Secondary | ICD-10-CM | POA: Diagnosis present

## 2018-03-08 DIAGNOSIS — E1165 Type 2 diabetes mellitus with hyperglycemia: Secondary | ICD-10-CM | POA: Diagnosis present

## 2018-03-08 DIAGNOSIS — J9601 Acute respiratory failure with hypoxia: Secondary | ICD-10-CM | POA: Diagnosis not present

## 2018-03-08 DIAGNOSIS — E785 Hyperlipidemia, unspecified: Secondary | ICD-10-CM | POA: Diagnosis not present

## 2018-03-08 DIAGNOSIS — R0602 Shortness of breath: Secondary | ICD-10-CM | POA: Diagnosis present

## 2018-03-08 DIAGNOSIS — I482 Chronic atrial fibrillation: Secondary | ICD-10-CM | POA: Diagnosis present

## 2018-03-08 DIAGNOSIS — I110 Hypertensive heart disease with heart failure: Secondary | ICD-10-CM | POA: Diagnosis present

## 2018-03-08 LAB — BASIC METABOLIC PANEL
ANION GAP: 16 — AB (ref 5–15)
ANION GAP: 16 — AB (ref 5–15)
ANION GAP: 15 (ref 5–15)
BUN: 26 mg/dL — AB (ref 8–23)
BUN: 27 mg/dL — ABNORMAL HIGH (ref 8–23)
BUN: 27 mg/dL — ABNORMAL HIGH (ref 8–23)
CHLORIDE: 68 mmol/L — AB (ref 98–111)
CHLORIDE: 69 mmol/L — AB (ref 98–111)
CO2: 43 mmol/L — ABNORMAL HIGH (ref 22–32)
CO2: 45 mmol/L — AB (ref 22–32)
CO2: 46 mmol/L — AB (ref 22–32)
Calcium: 8.4 mg/dL — ABNORMAL LOW (ref 8.9–10.3)
Calcium: 8.7 mg/dL — ABNORMAL LOW (ref 8.9–10.3)
Calcium: 8.8 mg/dL — ABNORMAL LOW (ref 8.9–10.3)
Chloride: 67 mmol/L — ABNORMAL LOW (ref 98–111)
Creatinine, Ser: 1.45 mg/dL — ABNORMAL HIGH (ref 0.61–1.24)
Creatinine, Ser: 1.38 mg/dL — ABNORMAL HIGH (ref 0.61–1.24)
Creatinine, Ser: 1.3 mg/dL — ABNORMAL HIGH (ref 0.61–1.24)
GFR calc non Af Amer: 51 mL/min — ABNORMAL LOW (ref >60)
GFR, EST AFRICAN AMERICAN: 51 mL/min — AB (ref >60)
GFR, EST AFRICAN AMERICAN: 55 mL/min — AB (ref >60)
GFR, EST AFRICAN AMERICAN: 59 mL/min — AB (ref >60)
GFR, EST NON AFRICAN AMERICAN: 44 mL/min — AB (ref >60)
GFR, EST NON AFRICAN AMERICAN: 47 mL/min — AB (ref >60)
Glucose, Bld: 202 mg/dL — ABNORMAL HIGH (ref 70–99)
Glucose, Bld: 208 mg/dL — ABNORMAL HIGH (ref 70–99)
Glucose, Bld: 263 mg/dL — ABNORMAL HIGH (ref 70–99)
POTASSIUM: 2.1 mmol/L — AB (ref 3.5–5.1)
POTASSIUM: 2.3 mmol/L — AB (ref 3.5–5.1)
Potassium: 3 mmol/L — ABNORMAL LOW (ref 3.5–5.1)
SODIUM: 127 mmol/L — AB (ref 135–145)
Sodium: 128 mmol/L — ABNORMAL LOW (ref 135–145)
Sodium: 130 mmol/L — ABNORMAL LOW (ref 135–145)

## 2018-03-08 LAB — CBC
HCT: 34.6 % — ABNORMAL LOW (ref 39.0–52.0)
Hemoglobin: 10.8 g/dL — ABNORMAL LOW (ref 13.0–17.0)
MCH: 25.3 pg — AB (ref 26.0–34.0)
MCHC: 31.2 g/dL (ref 30.0–36.0)
MCV: 81 fL (ref 78.0–100.0)
Platelets: 294 10*3/uL (ref 150–400)
RBC: 4.27 MIL/uL (ref 4.22–5.81)
RDW: 17.3 % — ABNORMAL HIGH (ref 11.5–15.5)
WBC: 10.8 10*3/uL — AB (ref 4.0–10.5)

## 2018-03-08 LAB — GLUCOSE, CAPILLARY
GLUCOSE-CAPILLARY: 273 mg/dL — AB (ref 70–99)
GLUCOSE-CAPILLARY: 334 mg/dL — AB (ref 70–99)
Glucose-Capillary: 212 mg/dL — ABNORMAL HIGH (ref 70–99)

## 2018-03-08 LAB — MAGNESIUM
MAGNESIUM: 2.6 mg/dL — AB (ref 1.7–2.4)
Magnesium: 1.7 mg/dL (ref 1.7–2.4)

## 2018-03-08 LAB — MRSA PCR SCREENING: MRSA by PCR: NEGATIVE

## 2018-03-08 LAB — PHOSPHORUS: Phosphorus: 4.2 mg/dL (ref 2.5–4.6)

## 2018-03-08 LAB — BRAIN NATRIURETIC PEPTIDE: B NATRIURETIC PEPTIDE 5: 119 pg/mL — AB (ref 0.0–100.0)

## 2018-03-08 LAB — TROPONIN I: Troponin I: <0.03 ng/mL (ref <0.03)

## 2018-03-08 MED ORDER — ONDANSETRON HCL 4 MG/2ML IJ SOLN: 4.0000 mg | Freq: Four times a day (QID) | INTRAMUSCULAR | Status: DC | PRN

## 2018-03-08 MED ORDER — ORAL CARE MOUTH RINSE
15.0000 mL | Freq: Two times a day (BID) | OROMUCOSAL | Status: DC
  Administered 2018-03-08 – 2018-03-10 (×6): 15 mL via OROMUCOSAL

## 2018-03-08 MED ORDER — ALPRAZOLAM 0.25 MG PO TABS
0.2500 mg | ORAL_TABLET | Freq: Three times a day (TID) | ORAL | Status: DC | PRN
  Administered 2018-03-09 – 2018-03-11 (×2): 0.25 mg via ORAL
  Filled 2018-03-08 (×2): qty 1

## 2018-03-08 MED ORDER — POTASSIUM CHLORIDE IN NACL 40-0.9 MEQ/L-% IV SOLN
INTRAVENOUS | Status: DC
Start: 2018-03-08 — End: 2018-03-09
  Administered 2018-03-08 (×2): 60 mL/h via INTRAVENOUS

## 2018-03-08 MED ORDER — POTASSIUM CHLORIDE CRYS ER 20 MEQ PO TBCR
40.0000 meq | EXTENDED_RELEASE_TABLET | Freq: Once | ORAL | Status: AC
  Administered 2018-03-08: 40 meq via ORAL
  Filled 2018-03-08: qty 2

## 2018-03-08 MED ORDER — MAGNESIUM SULFATE 2 GM/50ML IV SOLN
2.0000 g | Freq: Once | INTRAVENOUS | Status: AC
  Administered 2018-03-08: 2 g via INTRAVENOUS
  Filled 2018-03-08: qty 50

## 2018-03-08 MED ORDER — POTASSIUM CHLORIDE 10 MEQ/100ML IV SOLN
10.0000 meq | INTRAVENOUS | Status: AC
  Administered 2018-03-08 (×3): 10 meq via INTRAVENOUS
  Filled 2018-03-08 (×3): qty 100

## 2018-03-08 MED ORDER — FUROSEMIDE 40 MG PO TABS
40.0000 mg | ORAL_TABLET | Freq: Every day | ORAL | Status: DC
  Administered 2018-03-08 – 2018-03-11 (×4): 40 mg via ORAL
  Filled 2018-03-08 (×4): qty 1

## 2018-03-08 MED ORDER — INSULIN ASPART 100 UNIT/ML ~~LOC~~ SOLN
6.0000 [IU] | Freq: Three times a day (TID) | SUBCUTANEOUS | Status: DC
Start: 2018-03-08 — End: 2018-03-09
  Administered 2018-03-08: 6 [IU] via SUBCUTANEOUS

## 2018-03-08 MED ORDER — ONDANSETRON HCL 4 MG PO TABS: 4.0000 mg | ORAL_TABLET | Freq: Four times a day (QID) | ORAL | Status: DC | PRN

## 2018-03-08 MED ORDER — GLIPIZIDE ER 5 MG PO TB24: 5.0000 mg | ORAL_TABLET | Freq: Every day | ORAL | Status: DC

## 2018-03-08 MED ORDER — IPRATROPIUM-ALBUTEROL 0.5-2.5 (3) MG/3ML IN SOLN
3.0000 mL | Freq: Four times a day (QID) | RESPIRATORY_TRACT | Status: DC
  Administered 2018-03-08 – 2018-03-09 (×5): 3 mL via RESPIRATORY_TRACT
  Filled 2018-03-08 (×5): qty 3

## 2018-03-08 MED ORDER — INSULIN ASPART 100 UNIT/ML ~~LOC~~ SOLN
0.0000 [IU] | Freq: Three times a day (TID) | SUBCUTANEOUS | Status: DC
  Administered 2018-03-08 (×2): 15 [IU] via SUBCUTANEOUS
  Administered 2018-03-08: 11 [IU] via SUBCUTANEOUS
  Administered 2018-03-09: 7 [IU] via SUBCUTANEOUS
  Administered 2018-03-09: 11 [IU] via SUBCUTANEOUS
  Administered 2018-03-09: 7 [IU] via SUBCUTANEOUS
  Administered 2018-03-10 (×2): 11 [IU] via SUBCUTANEOUS
  Administered 2018-03-10: 7 [IU] via SUBCUTANEOUS
  Administered 2018-03-11: 15 [IU] via SUBCUTANEOUS
  Administered 2018-03-11: 11 [IU] via SUBCUTANEOUS

## 2018-03-08 MED ORDER — ACETAMINOPHEN 325 MG PO TABS: 650.0000 mg | ORAL_TABLET | Freq: Four times a day (QID) | ORAL | Status: DC | PRN

## 2018-03-08 MED ORDER — ACETAMINOPHEN 650 MG RE SUPP
650.0000 mg | Freq: Four times a day (QID) | RECTAL | Status: DC | PRN
Start: 2018-03-08 — End: 2018-03-11

## 2018-03-08 MED ORDER — DILTIAZEM HCL ER BEADS 300 MG PO CP24
300.0000 mg | ORAL_CAPSULE | Freq: Every day | ORAL | Status: DC
  Administered 2018-03-08 – 2018-03-11 (×4): 300 mg via ORAL
  Filled 2018-03-08 (×4): qty 1

## 2018-03-08 MED ORDER — METHYLPREDNISOLONE SODIUM SUCC 125 MG IJ SOLR
125.0000 mg | Freq: Once | INTRAMUSCULAR | Status: AC
  Administered 2018-03-08: 125 mg via INTRAVENOUS
  Filled 2018-03-08: qty 2

## 2018-03-08 MED ORDER — PANTOPRAZOLE SODIUM 40 MG PO TBEC
40.0000 mg | DELAYED_RELEASE_TABLET | Freq: Every day | ORAL | Status: DC
  Administered 2018-03-08 – 2018-03-11 (×4): 40 mg via ORAL
  Filled 2018-03-08 (×4): qty 1

## 2018-03-08 MED ORDER — MONTELUKAST SODIUM 10 MG PO TABS
10.0000 mg | ORAL_TABLET | Freq: Every day | ORAL | Status: DC
  Administered 2018-03-08 – 2018-03-10 (×3): 10 mg via ORAL
  Filled 2018-03-08 (×3): qty 1

## 2018-03-08 MED ORDER — METHYLPREDNISOLONE SODIUM SUCC 40 MG IJ SOLR
40.0000 mg | Freq: Four times a day (QID) | INTRAMUSCULAR | Status: AC
  Administered 2018-03-08 – 2018-03-09 (×4): 40 mg via INTRAVENOUS
  Filled 2018-03-08 (×4): qty 1

## 2018-03-08 MED ORDER — ROSUVASTATIN CALCIUM 20 MG PO TABS
20.0000 mg | ORAL_TABLET | Freq: Every day | ORAL | Status: DC
  Administered 2018-03-08 – 2018-03-11 (×4): 20 mg via ORAL
  Filled 2018-03-08 (×4): qty 1

## 2018-03-08 MED ORDER — ALBUTEROL SULFATE (2.5 MG/3ML) 0.083% IN NEBU
2.5000 mg | INHALATION_SOLUTION | RESPIRATORY_TRACT | Status: DC | PRN
  Administered 2018-03-08 – 2018-03-11 (×3): 2.5 mg via RESPIRATORY_TRACT
  Filled 2018-03-08 (×3): qty 3

## 2018-03-08 MED ORDER — INSULIN GLARGINE 100 UNIT/ML ~~LOC~~ SOLN
20.0000 [IU] | Freq: Every day | SUBCUTANEOUS | Status: DC
Start: 2018-03-08 — End: 2018-03-09
  Administered 2018-03-08: 20 [IU] via SUBCUTANEOUS
  Filled 2018-03-08 (×3): qty 0.2

## 2018-03-08 NOTE — Progress Notes (Signed)
Initial Nutrition Assessment  DOCUMENTATION CODES:      INTERVENTION:  Ensure Enlive po BID, each supplement provides 350 kcal and 20 grams of protein   Nutrition education completed  Recommend if pt is discharged -he continue Ensure BID and MVI daily for at least 1 month    NUTRITION DIAGNOSIS:   Inadequate oral intake related to chronic illness(COPD- and habit of eating 1 meal daily and appeasing hunger with non-nutritrious foods the remainder of the day) as evidenced by percent weight loss, per patient/family report.   GOAL:   Patient will meet greater than or equal to 90% of their needs MONITOR:   PO intake, Supplement acceptance, Labs, Weight trends  REASON FOR ASSESSMENT:   Consult Assessment of nutrition requirement/status, COPD Protocol  ASSESSMENT: Patient is a 77 yo male with hx that includes COPD, CAD, GERD, CHF and esophageal stricture. He presents with shortness of breath-acute on chronic respiratory failure. Patient is up in chair and unhappy about it. He is wanting to go back to bed. We talked about his home nutrition regimen. He says I'm on a "see food" diet. Actually he is eating eggs, oatmeal and breakfast meat in the morning (prepared by his daughter, the remainder of day is snack foods. Examples he gave were ice cream, cookies, nabs. Basically he is eating only 1 meal per day. Talked with him about his poor intake and challenges of eating well with breathing difficulty. Offered ideas for him to puree his foods, utilize Ensure or other high protein supplement on days when it is diffiuclt for him to eat.    Patient says his daughter had been trying to get him to drink Ensure but he has been resistant. He is now more amiable to the suggestion. Recommend he drink at least 2 per day based on his current meal pattern.  Fluctuation in his weight down from 175 lb to current 156.8 lb in  1 month. Severe loss of 10% during timeframe. Patient is on a diuretic medication  and has a hx of CHF. Expect his weight loss is a combination of inadequate oral intake and fluid changes.He is at increased risk for malnutrition given his Chronic COPD (O2 dependent 3L at home), and limited nutrition intake.   Meds reviewed: lasix, insulin, prednisone, protonix  Labs reviewed: BMP Latest Ref Rng & Units 03/09/2018 03/08/2018 03/08/2018  Glucose 70 - 99 mg/dL 283(H) 263(H) 208(H)  BUN 8 - 23 mg/dL 31(H) 27(H) 27(H)  Creatinine 0.61 - 1.24 mg/dL 1.18 1.30(H) 1.38(H)  Sodium 135 - 145 mmol/L 135 130(L) 128(L)  Potassium 3.5 - 5.1 mmol/L 2.8(L) 3.0(L) 2.3(LL)  Chloride 98 - 111 mmol/L 77(L) 69(L) 67(L)  CO2 22 - 32 mmol/L 47(H) 46(H) 45(H)  Calcium 8.9 - 10.3 mg/dL 8.8(L) 8.8(L) 8.7(L)    Diet Order:   Diet Order           Diet heart healthy/carb modified Room service appropriate? Yes; Fluid consistency: Thin  Diet effective now          EDUCATION NEEDS:   Education needs have been addressedSkin:  Skin Assessment: Reviewed RN Assessment. Deep pitting edema BLE.   Last BM:  03/07/18  Height:   Ht Readings from Last 1 Encounters:  03/08/18 5\' 5"  (1.651 m)    Weight:   Wt Readings from Last 1 Encounters:  03/08/18 156 lb 8.4 oz (71 kg)    Ideal Body Weight:  62 kg  BMI:  Body mass index is 26.05 kg/m.  Estimated  Nutritional Needs:   Kcal:  2000-2200  Protein:  85-90 gr  Fluid:  < 2 liters daily   Colman Cater MS,RD,CSG,LDN Office: [phone] Pager: [phone]

## 2018-03-08 NOTE — ED Notes (Signed)
Date and time results received: 03/08/18 0030   Test: Potassium Critical Value: 2.1  Name of Provider Notified: Rancour, MD

## 2018-03-08 NOTE — Progress Notes (Signed)
Nutrition Brief Note  Consult received related to COPD protocol is in process and will be completed on Friday.  Colman Cater MS,RD,CSG,LDN Office: [phone] Pager: [phone]

## 2018-03-08 NOTE — H&P (Signed)
History and Physical    Andrew [name] PPJ:[id number] DOB: 04-30-1941 DOA: 03/07/2018  PCP: [facility]   Patient coming from: Home.  I have personally briefly reviewed patient's old medical records in [place]  Chief Complaint: Shortness of breath.  HPI: Andrew [name] is a 77 y.o. male with below past medical history chronic atrial fibrillation, abdominal pain, colon polyps, osteoarthritis, asthma/COPD, CAD, history of skin cancer of the nose, bilateral cataracts, unspecified CHF, chronic lower back pain, esophageal stricture, diverticulosis of colon, GERD, history of SBO, hyperlipidemia, hypertension, BPH, perirectal abscess, urolithiasis, history of stroke who is coming to the emergency department due to shortness of breath s associated with productive cough for the past 3 days.  He was found at home with O2 sats in the 70s per EMS report.  He was given bronchodilators, supplemental oxygen and Solu-Medrol in route to the hospital.  The patient is a poor historian, is mildly confused.  However, he denies fever, sore throat, chest pain, palpitations, dizziness, diaphoresis, but complains of worsening lower extremity edema and abdominal wall edema.  ED Course: Initial vital signs temperature 98.2 F, pulse 56, respirations 20, blood pressure 145/88 mmHg O2 sat 97% on room air.  Patient was given supplemental oxygen and bronchodilators.  Lab work shows an ABG with a pH of 7.508, PCO2 of 60.2 and PO2 of 68.9 mmHg.  Bicarbonate was 45.3 and acid base excess was 22.1 mmol/L.  White count is 15.7, hemoglobin 10.3 g/dL and platelets 271.  Sodium 128, potassium 2.3, chloride 67, CO2 45 mmol/L.  Glucose 208, BUN 27, creatinine 1.38 and calcium 8.7 mg/dL.  Magnesium was 1.7 and phosphorus 4.2 mg/dL.  A 1 view chest radiograph show atelectasis of the left lung base similar to previous study.  Review of Systems: Unable to fully obtain.    Past Medical History:  Diagnosis Date  . A-fib  ([place])   . Abdominal pain   . Adenomatous colon polyp   . Arthritis   . Asthma   . CAD (coronary artery disease)   . Cancer ([place])    skin, nose  . Cataract    bilateral  . CHF (congestive heart failure) ([place])    patient reports  . Chronic LBP   . COPD (chronic obstructive pulmonary disease) ([place])   . Decreased libido   . Diverticulosis of colon   . Esophageal stricture   . Fatigue   . GERD (gastroesophageal reflux disease)   . History of small bowel obstruction   . Hyperlipidemia   . Hypertension   . Hypertrophy of prostate with urinary obstruction and other lower urinary tract symptoms (LUTS)   . Palpitations   . Peri-rectal abscess   . Personal history of renal calculi   . Prostatitis, acute   . Stroke [facility])    pt reports  . Tobacco abuse     Past Surgical History:  Procedure Laterality Date  . CATARACT EXTRACTION  04/2010   bilateral  . HERNIA REPAIR    . LUMBAR LAMINECTOMY     with Harrington rods  . PENILE PROSTHESIS PLACEMENT    . TONSILLECTOMY       reports that he quit smoking about 7 weeks ago. His smoking use included cigarettes. He smoked 1.00 pack per day. He has never used smokeless tobacco. He reports that he does not drink alcohol or use drugs.  Allergies  Allergen Reactions  . Procaine Other (See Comments) and Shortness Of Breath    Syncope  .  Procaine Hcl Anaphylaxis and Swelling    Family History  Problem Relation Age of Onset  . Heart disease Brother        CAD  . Diabetes Brother   . COPD Brother   . Emphysema Brother     Prior to Admission medications   Medication Sig Start Date End Date Taking? Authorizing Provider  acetaminophen (TYLENOL) 650 MG CR tablet Take 650 mg by mouth every 8 (eight) hours as needed for pain.    [provider]  albuterol (PROVENTIL) (2.5 MG/3ML) 0.083% nebulizer solution Take 3 mLs (2.5 mg total) by nebulization every 4 (four) hours as needed for wheezing or shortness of breath. 07/03/17   Arrien,  Jimmy Picket, MD  ALPRAZolam Duanne Moron) 0.25 MG tablet Take 1 tablet (0.25 mg total) by mouth 3 (three) times daily as needed for anxiety. 07/03/17   Arrien, Jimmy Picket, MD  apixaban (ELIQUIS) 5 MG TABS tablet Take 1 tablet (5 mg total) by mouth 2 (two) times daily. Restart after 5days on 6/9 02/11/18 03/13/18  Domenic Polite, MD  cyclobenzaprine (FLEXERIL) 10 MG tablet Take 5 mg by mouth at bedtime.    [provider]  diltiazem (TIAZAC) 300 MG 24 hr capsule Take 1 capsule (300 mg total) by mouth daily. 07/03/17 02/05/18  Arrien, Jimmy Picket, MD  diphenhydrAMINE (BENADRYL) 25 MG tablet Take 1 tablet by mouth at bedtime.    [provider]  docusate sodium (COLACE) 100 MG capsule Take 100 mg by mouth daily.    [provider]  Fluticasone-Salmeterol (ADVAIR DISKUS) 500-50 MCG/DOSE AEPB Inhale 1 puff into the lungs 2 (two) times daily. Patient taking differently: Inhale 1 puff into the lungs 2 (two) times daily as needed (shortness of breath).  07/03/17 02/05/18  Arrien, Jimmy Picket, MD  furosemide (LASIX) 40 MG tablet Take 1 tablet (40 mg total) by mouth daily. 12/31/17   Johnson, Clanford L, MD  glipiZIDE (GLUCOTROL XL) 5 MG 24 hr tablet Take 1 tablet (5 mg total) by mouth daily. 12/30/17   Johnson, Clanford L, MD  insulin aspart (NOVOLOG) 100 UNIT/ML FlexPen Check CBG's there etimes daily before meals. For CBG less than 150: 0 Units For CBG 150-200: 2 Units SQ For CBG 200-250: 4 Units SQ For CBG 250-300: 6 Units SQ For CBG 300-350: 8 Units SQ and call PCP for further instructions Patient taking differently: Inject 0-8 Units into the skin See admin instructions. Check CBG's there etimes daily before meals. For CBG less than 150: 0 Units For CBG 150-200: 2 Units SQ For CBG 200-250: 4 Units SQ For CBG 250-300: 6 Units SQ For CBG 300-350: 8 Units SQ and call PCP for further instructions 07/03/17   Arrien, Jimmy Picket, MD  insulin glargine (LANTUS) 100 unit/mL  SOPN Inject 0.25 mLs (25 Units total) into the skin daily. 12/30/17 02/05/18  Johnson, Clanford L, MD  Melatonin 3 MG TABS Take 1 tablet by mouth at bedtime.    [provider]  metoCLOPramide (REGLAN) 10 MG tablet Take 10 mg by mouth every 6 (six) hours as needed for nausea.    [provider]  montelukast (SINGULAIR) 10 MG tablet Take 10 mg by mouth at bedtime.    [provider]  Morphine Sulfate (MORPHINE CONCENTRATE) 10 mg / 0.5 ml concentrated solution Take 0.25 mLs by mouth every 3 (three) hours as needed for shortness of breath. 01/06/18   [provider]  omeprazole (PRILOSEC) 40 MG capsule Take 40 mg by mouth daily.  [provider]  rosuvastatin (CRESTOR) 20 MG tablet Take 1 tablet (20 mg total) by mouth daily. 07/03/17   Arrien, Jimmy Picket, MD  tiotropium (SPIRIVA HANDIHALER) 18 MCG inhalation capsule Place 1 capsule (18 mcg total) into inhaler and inhale daily. 07/03/17 07/03/18  Arrien, Jimmy Picket, MD  tiZANidine (ZANAFLEX) 4 MG tablet Take 4 mg by mouth 3 (three) times daily.    [provider]  vitamin B-12 (CYANOCOBALAMIN) 500 MCG tablet Take 500 mcg by mouth 2 (two) times daily.    [provider]    Physical Exam: Vitals:   03/08/18 0300 03/08/18 0327 03/08/18 0338 03/08/18 0400  BP: 136/63   121/64  Pulse: 90  84 99  Resp: 14   (!) 22  Temp:      TempSrc:      SpO2: 100% 100% 94% 93%  Weight:      Height:        Constitutional: NAD, calm, comfortable Eyes: PERRL, lids and conjunctivae normal ENMT: Mucous membranes are moist. Posterior pharynx clear of any exudate or lesions. Neck: normal, supple, no masses, no thyromegaly Respiratory: Decreased breath sounds bilaterally with mild wheezing, no crackles. Normal respiratory effort. No accessory muscle use.  Cardiovascular: Irregularly irregular, no murmurs / rubs / gallops. No extremity edema. 2+ pedal pulses. No carotid bruits.  Abdomen: Soft, no  tenderness, no masses palpated. No hepatosplenomegaly. Bowel sounds positive.  Musculoskeletal: no clubbing / cyanosis.  Good ROM, no contractures. Normal muscle tone.  Skin: Multiple areas of ecchymosis on extremities. Neurologic: CN 2-12 grossly intact. Sensation intact, DTR normal. Strength 5/5 in all 4.  Psychiatric: Alert and oriented x 2, partially oriented to time.. Normal mood.    Labs on Admission: I have personally reviewed following labs and imaging studies  CBC: Recent Labs  Lab 03/07/18 2326  WBC 15.7*  NEUTROABS 12.1*  HGB 10.3*  HCT 33.4*  MCV 79.0  PLT 427   Basic Metabolic Panel: Recent Labs  Lab 03/07/18 2326 03/08/18 0123  NA 127* 128*  K 2.1* 2.3*  CL 68* 67*  CO2 43* 45*  GLUCOSE 202* 208*  BUN 26* 27*  CREATININE 1.45* 1.38*  CALCIUM 8.4* 8.7*  MG  --  1.7  2.6*  PHOS  --  4.2   GFR: Estimated Creatinine Clearance: 37.8 mL/min (A) (by C-G formula based on SCr of 1.38 mg/dL (H)). Liver Function Tests: No results for input(s): AST, ALT, ALKPHOS, BILITOT, PROT, ALBUMIN in the last 168 hours. No results for input(s): LIPASE, AMYLASE in the last 168 hours. No results for input(s): AMMONIA in the last 168 hours. Coagulation Profile: No results for input(s): INR, PROTIME in the last 168 hours. Cardiac Enzymes: Recent Labs  Lab 03/07/18 2326  TROPONINI <0.03   BNP (last 3 results) No results for input(s): PROBNP in the last 8760 hours. HbA1C: No results for input(s): HGBA1C in the last 72 hours. CBG: No results for input(s): GLUCAP in the last 168 hours. Lipid Profile: No results for input(s): CHOL, HDL, LDLCALC, TRIG, CHOLHDL, LDLDIRECT in the last 72 hours. Thyroid Function Tests: No results for input(s): TSH, T4TOTAL, FREET4, T3FREE, THYROIDAB in the last 72 hours. Anemia Panel: No results for input(s): VITAMINB12, FOLATE, FERRITIN, TIBC, IRON, RETICCTPCT in the last 72 hours. Urine analysis:    Component Value Date/Time   COLORURINE  AMBER (A) 05/05/2013 1621   APPEARANCEUR CLEAR 05/05/2013 1621   LABSPEC 1.025 05/05/2013 1621   PHURINE 5.5 05/05/2013 1621   GLUCOSEU NEGATIVE  05/05/2013 1621   HGBUR NEGATIVE 05/05/2013 1621   HGBUR negative 03/31/2009 1514   BILIRUBINUR SMALL (A) 05/05/2013 1621   KETONESUR NEGATIVE 05/05/2013 1621   PROTEINUR NEGATIVE 05/05/2013 1621   UROBILINOGEN 1.0 05/05/2013 1621   NITRITE NEGATIVE 05/05/2013 1621   LEUKOCYTESUR NEGATIVE 05/05/2013 1621    Radiological Exams on Admission: Dg Chest Portable 1 View  Result Date: 03/07/2018 CLINICAL DATA:  Shortness of breath and cough for 3 days. EXAM: PORTABLE CHEST 1 VIEW COMPARISON:  02/03/2018 FINDINGS: Borderline heart size and pulmonary vascularity are likely normal for technique. Probable atelectasis in the left lung base similar to previous study. No focal consolidation. No blunting of costophrenic angles. No pneumothorax. Mediastinal contours appear intact. IMPRESSION: Atelectasis in the left lung base similar to previous study. No focal consolidation. Electronically Signed   By: Lucienne Capers M.D.   On: 03/07/2018 23:41    EKG: Independently reviewed.  Vent. rate 94 BPM PR interval * ms QRS duration 112 ms QT/QTc 409/435 ms P-R-T axes -5 90 33 Sinus rhythm Borderline intraventricular conduction delay Repol abnrm suggests ischemia, diffuse leads  Assessment/Plan Principal Problem:   Acute on chronic respiratory failure (HCC)   CHRONIC OBSTRUCTIVE PULMONARY DISEASE, ACUTE EXACERBATION Admit to SDU. Continue supplemental oxygen. Bronchodilators as needed. Solu-Medrol 40 mg IVP every 6 hours.  Active Problems:   Diabetes mellitus (HCC) Carb modified diet. Continue glipizide 5 mg po daily. CBG monitoring with RI SS. Will need pharmacy to double check Lantus dosing in AM.    HLD (hyperlipidemia) On crestor. Monitor LFTs as needed.    Essential hypertension Continue Tiazac 300 mg po daily. Monitor BP and HR.     Coronary atherosclerosis Continue Crestor and eliquis    ATRIAL FIBRILLATION CHA?DS?-VASc Score of at least 6. Continue Eliquis.    Hypokalemia Replacing. Follow up potassium level.    Hyponatremia Replacing. Check serum osmolality. Check urine sodium and osmolality. Follow up sodium level.    DVT prophylaxis:  Code Status: Full code. Family Communication:  Disposition Plan: Observatiobn for 24-48 hours for COPD exacerbation. Consults called: Admission status: Observation/SDU   Reubin Milan MD [facility] Pager [phone].  If 7PM-7AM, please contact night-coverage [email or web address] Password [facility]  03/08/2018, 6:14 AM

## 2018-03-08 NOTE — Progress Notes (Signed)
03/07/2018 10:58 PM  03/08/2018 8:42 AM  Andrew [name] was seen and examined.  The H&P by the admitting provider, orders, imaging was reviewed.  Please see new orders.  Will continue to follow.   Vitals:   03/08/18 0753 03/08/18 0800  BP:    Pulse:  88  Resp:  16  Temp:    SpO2: 94% 94%    Results for orders placed or performed during the hospital encounter of 03/07/18  MRSA PCR Screening  Result Value Ref Range   MRSA by PCR NEGATIVE NEGATIVE  CBC with Differential/Platelet  Result Value Ref Range   WBC 15.7 (H) 4.0 - 10.5 K/uL   RBC 4.23 4.22 - 5.81 MIL/uL   Hemoglobin 10.3 (L) 13.0 - 17.0 g/dL   HCT 33.4 (L) 39.0 - 52.0 %   MCV 79.0 78.0 - 100.0 fL   MCH 24.3 (L) 26.0 - 34.0 pg   MCHC 30.8 30.0 - 36.0 g/dL   RDW 17.5 (H) 11.5 - 15.5 %   Platelets 271 150 - 400 K/uL   Neutrophils Relative % 77 %   Neutro Abs 12.1 (H) 1.7 - 7.7 K/uL   Lymphocytes Relative 15 %   Lymphs Abs 2.4 0.7 - 4.0 K/uL   Monocytes Relative 8 %   Monocytes Absolute 1.2 (H) 0.1 - 1.0 K/uL   Eosinophils Relative 0 %   Eosinophils Absolute 0.0 0.0 - 0.7 K/uL   Basophils Relative 0 %   Basophils Absolute 0.0 0.0 - 0.1 K/uL  Basic metabolic panel  Result Value Ref Range   Sodium 127 (L) 135 - 145 mmol/L   Potassium 2.1 (LL) 3.5 - 5.1 mmol/L   Chloride 68 (L) 98 - 111 mmol/L   CO2 43 (H) 22 - 32 mmol/L   Glucose, Bld 202 (H) 70 - 99 mg/dL   BUN 26 (H) 8 - 23 mg/dL   Creatinine, Ser 1.45 (H) 0.61 - 1.24 mg/dL   Calcium 8.4 (L) 8.9 - 10.3 mg/dL   GFR calc non Af Amer 44 (L) >60 mL/min   GFR calc Af Amer 51 (L) >60 mL/min   Anion gap 16 (H) 5 - 15  Troponin I  Result Value Ref Range   Troponin I <0.03 <0.03 ng/mL  Brain natriuretic peptide  Result Value Ref Range   B Natriuretic Peptide 119.0 (H) 0.0 - 100.0 pg/mL  Blood gas, arterial ([facility] & [facility] ONLY)  Result Value Ref Range   FIO2 32.00    O2 Content 3.0 L/min   Delivery systems NASAL CANNULA    pH, Arterial 7.508 (H) 7.350 - 7.450   pCO2  arterial 60.2 (H) 32.0 - 48.0 mmHg   pO2, Arterial 68.9 (L) 83.0 - 108.0 mmHg   Bicarbonate 45.3 (H) 20.0 - 28.0 mmol/L   Acid-Base Excess 22.1 (H) 0.0 - 2.0 mmol/L   O2 Saturation 93.3 %   Patient temperature 37.0    Collection site BRACHIAL ARTERY    Drawn by [id number]    Sample type ARTERIAL DRAW    Allens test (pass/fail) PASS PASS  Magnesium  Result Value Ref Range   Magnesium 2.6 (H) 1.7 - 2.4 mg/dL  Basic metabolic panel  Result Value Ref Range   Sodium 128 (L) 135 - 145 mmol/L   Potassium 2.3 (LL) 3.5 - 5.1 mmol/L   Chloride 67 (L) 98 - 111 mmol/L   CO2 45 (H) 22 - 32 mmol/L   Glucose, Bld 208 (H) 70 - 99 mg/dL  BUN 27 (H) 8 - 23 mg/dL   Creatinine, Ser 1.38 (H) 0.61 - 1.24 mg/dL   Calcium 8.7 (L) 8.9 - 10.3 mg/dL   GFR calc non Af Amer 47 (L) >60 mL/min   GFR calc Af Amer 55 (L) >60 mL/min   Anion gap 16 (H) 5 - 15  Phosphorus  Result Value Ref Range   Phosphorus 4.2 2.5 - 4.6 mg/dL  Magnesium  Result Value Ref Range   Magnesium 1.7 1.7 - 2.4 mg/dL  Basic metabolic panel  Result Value Ref Range   Sodium 130 (L) 135 - 145 mmol/L   Potassium 3.0 (L) 3.5 - 5.1 mmol/L   Chloride 69 (L) 98 - 111 mmol/L   CO2 46 (H) 22 - 32 mmol/L   Glucose, Bld 263 (H) 70 - 99 mg/dL   BUN 27 (H) 8 - 23 mg/dL   Creatinine, Ser 1.30 (H) 0.61 - 1.24 mg/dL   Calcium 8.8 (L) 8.9 - 10.3 mg/dL   GFR calc non Af Amer 51 (L) >60 mL/min   GFR calc Af Amer 59 (L) >60 mL/min   Anion gap 15 5 - 15  Glucose, capillary  Result Value Ref Range   Glucose-Capillary 273 (H) 70 - 99 mg/dL    Murvin Natal, MD [facility]

## 2018-03-08 NOTE — Progress Notes (Signed)
Per MD, do not placed patient on BIPAP yet. Will continue to monitor and put patient on if needed.

## 2018-03-09 ENCOUNTER — Encounter (HOSPITAL_COMMUNITY): Payer: Self-pay

## 2018-03-09 DIAGNOSIS — E871 Hypo-osmolality and hyponatremia: Secondary | ICD-10-CM

## 2018-03-09 DIAGNOSIS — I480 Paroxysmal atrial fibrillation: Secondary | ICD-10-CM

## 2018-03-09 DIAGNOSIS — E785 Hyperlipidemia, unspecified: Secondary | ICD-10-CM

## 2018-03-09 DIAGNOSIS — Z794 Long term (current) use of insulin: Secondary | ICD-10-CM

## 2018-03-09 DIAGNOSIS — E1165 Type 2 diabetes mellitus with hyperglycemia: Secondary | ICD-10-CM

## 2018-03-09 DIAGNOSIS — E876 Hypokalemia: Secondary | ICD-10-CM

## 2018-03-09 DIAGNOSIS — I10 Essential (primary) hypertension: Secondary | ICD-10-CM

## 2018-03-09 DIAGNOSIS — J441 Chronic obstructive pulmonary disease with (acute) exacerbation: Principal | ICD-10-CM

## 2018-03-09 LAB — GLUCOSE, CAPILLARY
GLUCOSE-CAPILLARY: 239 mg/dL — AB (ref 70–99)
GLUCOSE-CAPILLARY: 268 mg/dL — AB (ref 70–99)
GLUCOSE-CAPILLARY: 156 mg/dL — AB (ref 70–99)
Glucose-Capillary: 310 mg/dL — ABNORMAL HIGH (ref 70–99)
Glucose-Capillary: 243 mg/dL — ABNORMAL HIGH (ref 70–99)

## 2018-03-09 LAB — CBC WITH DIFFERENTIAL/PLATELET
BASOS ABS: 0 10*3/uL (ref 0.0–0.1)
BASOS PCT: 0 %
EOS ABS: 0 10*3/uL (ref 0.0–0.7)
EOS PCT: 0 %
HCT: 32.3 % — ABNORMAL LOW (ref 39.0–52.0)
Hemoglobin: 9.7 g/dL — ABNORMAL LOW (ref 13.0–17.0)
Lymphocytes Relative: 3 %
Lymphs Abs: 0.3 10*3/uL — ABNORMAL LOW (ref 0.7–4.0)
MCH: 24.3 pg — ABNORMAL LOW (ref 26.0–34.0)
MCHC: 30 g/dL (ref 30.0–36.0)
MCV: 80.8 fL (ref 78.0–100.0)
MONO ABS: 0.6 10*3/uL (ref 0.1–1.0)
MONOS PCT: 5 %
Neutro Abs: 12.1 10*3/uL — ABNORMAL HIGH (ref 1.7–7.7)
Neutrophils Relative %: 92 %
PLATELETS: 262 10*3/uL (ref 150–400)
RBC: 4 MIL/uL — ABNORMAL LOW (ref 4.22–5.81)
RDW: 16.9 % — AB (ref 11.5–15.5)
WBC: 13.1 10*3/uL — ABNORMAL HIGH (ref 4.0–10.5)

## 2018-03-09 LAB — MAGNESIUM: MAGNESIUM: 2.2 mg/dL (ref 1.7–2.4)

## 2018-03-09 LAB — BASIC METABOLIC PANEL
ANION GAP: 11 (ref 5–15)
BUN: 31 mg/dL — ABNORMAL HIGH (ref 8–23)
CALCIUM: 8.8 mg/dL — AB (ref 8.9–10.3)
CO2: 47 mmol/L — AB (ref 22–32)
CREATININE: 1.18 mg/dL (ref 0.61–1.24)
Chloride: 77 mmol/L — ABNORMAL LOW (ref 98–111)
GFR calc Af Amer: >60 mL/min (ref >60)
GFR, EST NON AFRICAN AMERICAN: 57 mL/min — AB (ref >60)
GLUCOSE: 283 mg/dL — AB (ref 70–99)
Potassium: 2.8 mmol/L — ABNORMAL LOW (ref 3.5–5.1)
Sodium: 135 mmol/L (ref 135–145)

## 2018-03-09 MED ORDER — POTASSIUM CHLORIDE CRYS ER 20 MEQ PO TBCR
60.0000 meq | EXTENDED_RELEASE_TABLET | Freq: Once | ORAL | Status: AC
  Administered 2018-03-09: 60 meq via ORAL
  Filled 2018-03-09: qty 3

## 2018-03-09 MED ORDER — IPRATROPIUM-ALBUTEROL 0.5-2.5 (3) MG/3ML IN SOLN
3.0000 mL | RESPIRATORY_TRACT | Status: DC
  Administered 2018-03-09 – 2018-03-11 (×14): 3 mL via RESPIRATORY_TRACT
  Filled 2018-03-09 (×14): qty 3

## 2018-03-09 MED ORDER — APIXABAN 5 MG PO TABS
5.0000 mg | ORAL_TABLET | Freq: Two times a day (BID) | ORAL | Status: DC
  Administered 2018-03-09 – 2018-03-11 (×5): 5 mg via ORAL
  Filled 2018-03-09 (×5): qty 1

## 2018-03-09 MED ORDER — METHYLPREDNISOLONE SODIUM SUCC 40 MG IJ SOLR
40.0000 mg | Freq: Four times a day (QID) | INTRAMUSCULAR | Status: DC
  Administered 2018-03-09 – 2018-03-10 (×5): 40 mg via INTRAVENOUS
  Filled 2018-03-09 (×4): qty 1

## 2018-03-09 MED ORDER — INSULIN ASPART 100 UNIT/ML ~~LOC~~ SOLN
12.0000 [IU] | Freq: Three times a day (TID) | SUBCUTANEOUS | Status: DC
  Administered 2018-03-10 – 2018-03-11 (×5): 12 [IU] via SUBCUTANEOUS

## 2018-03-09 MED ORDER — INSULIN GLARGINE 100 UNIT/ML ~~LOC~~ SOLN
24.0000 [IU] | Freq: Every day | SUBCUTANEOUS | Status: DC
  Administered 2018-03-09: 24 [IU] via SUBCUTANEOUS
  Filled 2018-03-09 (×4): qty 0.24

## 2018-03-09 MED ORDER — SODIUM CHLORIDE 0.9 % IV SOLN
100.0000 mg | Freq: Two times a day (BID) | INTRAVENOUS | Status: DC
  Administered 2018-03-09 – 2018-03-11 (×4): 100 mg via INTRAVENOUS
  Filled 2018-03-09 (×7): qty 100

## 2018-03-09 MED ORDER — NICOTINE 21 MG/24HR TD PT24
21.0000 mg | MEDICATED_PATCH | Freq: Every day | TRANSDERMAL | Status: DC
  Administered 2018-03-09 – 2018-03-11 (×3): 21 mg via TRANSDERMAL
  Filled 2018-03-09 (×3): qty 1

## 2018-03-09 MED ORDER — INSULIN ASPART 100 UNIT/ML ~~LOC~~ SOLN
10.0000 [IU] | Freq: Three times a day (TID) | SUBCUTANEOUS | Status: DC
  Administered 2018-03-09 (×2): 10 [IU] via SUBCUTANEOUS

## 2018-03-09 MED ORDER — MORPHINE SULFATE (CONCENTRATE) 10 MG/0.5ML PO SOLN
5.0000 mg | ORAL | Status: DC | PRN
  Administered 2018-03-09 – 2018-03-11 (×2): 5 mg via ORAL
  Filled 2018-03-09 (×2): qty 0.5

## 2018-03-09 MED ORDER — INSULIN GLARGINE 100 UNITS/ML SOLOSTAR PEN
25.0000 [IU] | PEN_INJECTOR | Freq: Every day | SUBCUTANEOUS | Status: DC
  Filled 2018-03-09: qty 3

## 2018-03-09 MED ORDER — INSULIN ASPART 100 UNIT/ML ~~LOC~~ SOLN
8.0000 [IU] | Freq: Three times a day (TID) | SUBCUTANEOUS | Status: DC
  Administered 2018-03-09: 8 [IU] via SUBCUTANEOUS

## 2018-03-09 MED ORDER — POTASSIUM CHLORIDE CRYS ER 20 MEQ PO TBCR
40.0000 meq | EXTENDED_RELEASE_TABLET | Freq: Two times a day (BID) | ORAL | Status: DC
Start: 2018-03-09 — End: 2018-03-11
  Administered 2018-03-09 – 2018-03-11 (×4): 40 meq via ORAL
  Filled 2018-03-09 (×4): qty 2

## 2018-03-09 MED ORDER — ADULT MULTIVITAMIN W/MINERALS CH
1.0000 | ORAL_TABLET | Freq: Every day | ORAL | Status: DC
  Administered 2018-03-09 – 2018-03-11 (×3): 1 via ORAL
  Filled 2018-03-09 (×3): qty 1

## 2018-03-09 MED ORDER — INSULIN GLARGINE 100 UNITS/ML SOLOSTAR PEN
36.0000 [IU] | PEN_INJECTOR | Freq: Every day | SUBCUTANEOUS | Status: DC
  Filled 2018-03-09: qty 3

## 2018-03-09 MED ORDER — ENSURE ENLIVE PO LIQD
237.0000 mL | Freq: Two times a day (BID) | ORAL | Status: DC
  Administered 2018-03-09 – 2018-03-10 (×3): 237 mL via ORAL

## 2018-03-09 NOTE — Care Management (Signed)
Patient is currently active with [facility] for RN and PT services.

## 2018-03-09 NOTE — Progress Notes (Addendum)
PROGRESS NOTE    Andrew [name]  ZDG:[id number]  DOB: Dec 31, 1940  DOA: 03/07/2018 PCP: [facility]   Brief Admission Hx: ATTICUS WEDIN is a 77 y.o. male with below past medical history chronic atrial fibrillation, abdominal pain, colon polyps, osteoarthritis, asthma/COPD, CAD, history of skin cancer of the nose, bilateral cataracts, unspecified CHF, chronic lower back pain, esophageal stricture, diverticulosis of colon, GERD, history of SBO, hyperlipidemia, hypertension, BPH, perirectal abscess, urolithiasis, history of stroke who is coming to the emergency department due to shortness of breath s associated with productive cough for the past 3 days.   MDM/Assessment & Plan: 1. Acute on chronic respiratory failure with hypoxia - secondary to COPD acute exacerbation - Continue IV solumedrol, scheduled duonebs, supplemental oxygen and antibiotics.  He is very slow to improve and his COPD is severe.  Continue current therapy.  2. Type 2 DM with hyperglycemia - steroid induced - titrated up the insulin doses this morning.  Continue to monitor.  3. HLD - continue crestor 20 mg daily.  4. HTN - continue tiazac 300 mg daily and monitor.  5. Chronic Atrial fibrillation - continue eliquis and Tiazac.  6. CAD - stable.  7. Hypokalemia - Additional potassium ordered.  Follow Mg.  Follow.  8. Hyponatremia - improved.    DVT prophylaxis: eliquis Code Status: Full  Family Communication: none present, attempted to call daughter/son, no answer, pt says they are working Disposition Plan: Stepdown unit   Subjective: Pt says that he continues to feel ill.  He is SOB.  He is coughing a lot.    Objective: Vitals:   03/09/18 0300 03/09/18 0400 03/09/18 0500 03/09/18 0600  BP: 119/60 128/64 118/61 131/62  Pulse: 80 81 84 84  Resp: 20 17    Temp:      TempSrc:      SpO2: 99% 99% 92% 95%  Weight:      Height:        Intake/Output Summary (Last 24 hours) at 03/09/2018 0658 Last data filed at  03/09/2018 0600 Gross per 24 hour  Intake 1338 ml  Output 1850 ml  Net -512 ml   Filed Weights   03/07/18 2309 03/08/18 0209 03/08/18 0500  Weight: 77.1 kg (170 lb) 71 kg (156 lb 8.4 oz) 71 kg (156 lb 8.4 oz)     REVIEW OF SYSTEMS  As per history otherwise all reviewed and reported negative  Exam:  General exam: awake, alert, NAD. Cooperative.  Respiratory system: diffuse rales, expiratory wheezes and upper chest congestion. No increased work of breathing. Cardiovascular system: S1 & S2 heard, irregular. No pedal edema.  Gastrointestinal system: Abdomen is nondistended, soft and nontender. Normal bowel sounds heard. Central nervous system: Alert and oriented. No focal neurological deficits. Extremities: no CCE.  Data Reviewed: Basic Metabolic Panel: Recent Labs  Lab 03/07/18 2326 03/08/18 0123 03/08/18 0756  NA 127* 128* 130*  K 2.1* 2.3* 3.0*  CL 68* 67* 69*  CO2 43* 45* 46*  GLUCOSE 202* 208* 263*  BUN 26* 27* 27*  CREATININE 1.45* 1.38* 1.30*  CALCIUM 8.4* 8.7* 8.8*  MG  --  1.7  2.6*  --   PHOS  --  4.2  --    Liver Function Tests: No results for input(s): AST, ALT, ALKPHOS, BILITOT, PROT, ALBUMIN in the last 168 hours. No results for input(s): LIPASE, AMYLASE in the last 168 hours. No results for input(s): AMMONIA in the last 168 hours. CBC: Recent Labs  Lab 03/07/18  2326 03/08/18 0756  WBC 15.7* 10.8*  NEUTROABS 12.1*  --   HGB 10.3* 10.8*  HCT 33.4* 34.6*  MCV 79.0 81.0  PLT 271 294   Cardiac Enzymes: Recent Labs  Lab 03/07/18 2326  TROPONINI <0.03   CBG (last 3)  Recent Labs    03/08/18 0727 03/08/18 1111 03/08/18 2134  GLUCAP 273* 334* 212*   Recent Results (from the past 240 hour(s))  MRSA PCR Screening     Status: None   Collection Time: 03/08/18  1:59 AM  Result Value Ref Range Status   MRSA by PCR NEGATIVE NEGATIVE Final    Comment:        The GeneXpert MRSA Assay (FDA approved for NASAL specimens only), is one component of  a comprehensive MRSA colonization surveillance program. It is not intended to diagnose MRSA infection nor to guide or monitor treatment for MRSA infections. Performed at [facility], [address], [place], [place] [address]      Studies: Dg Chest Portable 1 View  Result Date: 03/07/2018 CLINICAL DATA:  Shortness of breath and cough for 3 days. EXAM: PORTABLE CHEST 1 VIEW COMPARISON:  02/03/2018 FINDINGS: Borderline heart size and pulmonary vascularity are likely normal for technique. Probable atelectasis in the left lung base similar to previous study. No focal consolidation. No blunting of costophrenic angles. No pneumothorax. Mediastinal contours appear intact. IMPRESSION: Atelectasis in the left lung base similar to previous study. No focal consolidation. Electronically Signed   By: Lucienne Capers M.D.   On: 03/07/2018 23:41   Scheduled Meds: . diltiazem  300 mg Oral Daily  . furosemide  40 mg Oral Daily  . insulin aspart  0-20 Units Subcutaneous TID WC  . insulin aspart  6 Units Subcutaneous TID WC  . insulin glargine  20 Units Subcutaneous Daily  . ipratropium-albuterol  3 mL Nebulization Q6H  . mouth rinse  15 mL Mouth Rinse BID  . montelukast  10 mg Oral QHS  . pantoprazole  40 mg Oral Daily  . rosuvastatin  20 mg Oral Daily   Continuous Infusions: . 0.9 % NaCl with KCl 40 mEq / L 60 mL/hr (03/08/18 2135)    Principal Problem:   Acute on chronic respiratory failure (HCC) Active Problems:   Diabetes mellitus ([place])   HLD (hyperlipidemia)   Essential hypertension   Coronary atherosclerosis   ATRIAL FIBRILLATION   CHRONIC OBSTRUCTIVE PULMONARY DISEASE, ACUTE EXACERBATION   Hypokalemia   Hyponatremia   Acute respiratory failure with hypoxia [facility])  Critical care Time spent: 32 mins  Irwin Brakeman, MD, FAAFP [facility] Pager [phone]  If 7PM-7AM, please contact night-coverage [email or web address] Password [email or web address] 03/09/2018, 6:58 AM    LOS: 1 day

## 2018-03-10 DIAGNOSIS — J9601 Acute respiratory failure with hypoxia: Secondary | ICD-10-CM

## 2018-03-10 LAB — BASIC METABOLIC PANEL
Anion gap: 11 (ref 5–15)
BUN: 30 mg/dL — AB (ref 8–23)
CHLORIDE: 78 mmol/L — AB (ref 98–111)
CO2: 48 mmol/L — ABNORMAL HIGH (ref 22–32)
Calcium: 9.1 mg/dL (ref 8.9–10.3)
Creatinine, Ser: 0.97 mg/dL (ref 0.61–1.24)
GFR calc Af Amer: >60 mL/min (ref >60)
GLUCOSE: 245 mg/dL — AB (ref 70–99)
POTASSIUM: 2.8 mmol/L — AB (ref 3.5–5.1)
Sodium: 137 mmol/L (ref 135–145)

## 2018-03-10 LAB — CBC WITH DIFFERENTIAL/PLATELET
Basophils Absolute: 0 10*3/uL (ref 0.0–0.1)
Basophils Relative: 0 %
EOS PCT: 0 %
Eosinophils Absolute: 0 10*3/uL (ref 0.0–0.7)
HCT: 32.3 % — ABNORMAL LOW (ref 39.0–52.0)
Hemoglobin: 9.9 g/dL — ABNORMAL LOW (ref 13.0–17.0)
LYMPHS ABS: 0.3 10*3/uL — AB (ref 0.7–4.0)
LYMPHS PCT: 3 %
MCH: 25.1 pg — AB (ref 26.0–34.0)
MCHC: 30.7 g/dL (ref 30.0–36.0)
MCV: 81.8 fL (ref 78.0–100.0)
MONO ABS: 0.6 10*3/uL (ref 0.1–1.0)
MONOS PCT: 6 %
Neutro Abs: 9.5 10*3/uL — ABNORMAL HIGH (ref 1.7–7.7)
Neutrophils Relative %: 91 %
PLATELETS: 270 10*3/uL (ref 150–400)
RBC: 3.95 MIL/uL — ABNORMAL LOW (ref 4.22–5.81)
RDW: 16.8 % — AB (ref 11.5–15.5)
WBC: 10.4 10*3/uL (ref 4.0–10.5)

## 2018-03-10 LAB — GLUCOSE, CAPILLARY
GLUCOSE-CAPILLARY: 303 mg/dL — AB (ref 70–99)
Glucose-Capillary: 251 mg/dL — ABNORMAL HIGH (ref 70–99)
Glucose-Capillary: 261 mg/dL — ABNORMAL HIGH (ref 70–99)

## 2018-03-10 LAB — MAGNESIUM: MAGNESIUM: 1.9 mg/dL (ref 1.7–2.4)

## 2018-03-10 MED ORDER — METHYLPREDNISOLONE SODIUM SUCC 40 MG IJ SOLR
30.0000 mg | Freq: Four times a day (QID) | INTRAMUSCULAR | Status: DC
  Administered 2018-03-10 – 2018-03-11 (×4): 30 mg via INTRAVENOUS
  Filled 2018-03-10 (×3): qty 1

## 2018-03-10 MED ORDER — SODIUM CHLORIDE 0.9 % IV SOLN
INTRAVENOUS | Status: DC | PRN
  Administered 2018-03-10: 09:00:00 via INTRAVENOUS

## 2018-03-10 MED ORDER — INSULIN GLARGINE 100 UNIT/ML ~~LOC~~ SOLN
36.0000 [IU] | Freq: Every day | SUBCUTANEOUS | Status: DC
  Administered 2018-03-10 – 2018-03-11 (×2): 36 [IU] via SUBCUTANEOUS
  Filled 2018-03-10 (×3): qty 0.36

## 2018-03-10 MED ORDER — MAGNESIUM SULFATE 2 GM/50ML IV SOLN
2.0000 g | Freq: Once | INTRAVENOUS | Status: AC
  Administered 2018-03-10: 2 g via INTRAVENOUS
  Filled 2018-03-10: qty 50

## 2018-03-10 MED ORDER — SODIUM CHLORIDE 0.9 % IV SOLN
INTRAVENOUS | Status: DC | PRN
  Administered 2018-03-10: 12:00:00 via INTRAVENOUS

## 2018-03-10 MED ORDER — POTASSIUM CHLORIDE CRYS ER 20 MEQ PO TBCR
40.0000 meq | EXTENDED_RELEASE_TABLET | Freq: Once | ORAL | Status: AC
  Administered 2018-03-10: 40 meq via ORAL
  Filled 2018-03-10: qty 2

## 2018-03-10 NOTE — Progress Notes (Addendum)
PROGRESS NOTE    BOLDEN HAGERMAN  OZD:[id number]  DOB: 1941/08/01  DOA: 03/07/2018 PCP: [facility]   Brief Admission Hx: Andrew [name] is a 77 y.o. male with below past medical history chronic atrial fibrillation, abdominal pain, colon polyps, osteoarthritis, asthma/COPD, CAD, history of skin cancer of the nose, bilateral cataracts, unspecified CHF, chronic lower back pain, esophageal stricture, diverticulosis of colon, GERD, history of SBO, hyperlipidemia, hypertension, BPH, perirectal abscess, urolithiasis, history of stroke who is coming to the emergency department due to shortness of breath s associated with productive cough for the past 3 days.   MDM/Assessment & Plan: 1. Acute on chronic respiratory failure with hypoxia - secondary to COPD acute exacerbation - Continue IV solumedrol, scheduled duonebs, supplemental oxygen and antibiotics.  He is very slow to improve and his COPD is severe.  Continue current therapy.  2. Type 2 DM with hyperglycemia - steroid induced - titrated up the insulin doses this morning.  Continue to monitor.  3. HLD - continue crestor 20 mg daily.   4. HTN - continue tiazac 300 mg daily and monitor.  5. Chronic Atrial fibrillation - continue eliquis and Tiazac.  6. CAD - stable.  7. Hypokalemia - Additional potassium ordered.  Additional Mg ordered.  Follow.  8. Hyponatremia - improved.   9. Anemia of chronic disease - hg holding at around 10.    DVT prophylaxis: eliquis Code Status: Full  Family Communication: son came to visit, called to speak to him in room Disposition Plan: home in 1-2 days if continues to improve  Subjective: Pt says he is feeling a little better today, has been less agitated.     Objective: Vitals:   03/10/18 0033 03/10/18 0434 03/10/18 0645 03/10/18 0742  BP:   (!) 121/55   Pulse: 76  61   Resp: 20  16   Temp:   98.7 F (37.1 C)   TempSrc:   Oral   SpO2: 93% 90% 99% 97%  Weight:      Height:         Intake/Output Summary (Last 24 hours) at 03/10/2018 1034 Last data filed at 03/10/2018 0800 Gross per 24 hour  Intake 1216.25 ml  Output 2100 ml  Net -883.75 ml   Filed Weights   03/07/18 2309 03/08/18 0209 03/08/18 0500  Weight: 77.1 kg (170 lb) 71 kg (156 lb 8.4 oz) 71 kg (156 lb 8.4 oz)     REVIEW OF SYSTEMS  As per history otherwise all reviewed and reported negative  Exam:  General exam: awake, alert, NAD. Cooperative.  Respiratory system: much better air movement, rare rales, fewer expiratory wheezes and upper chest congestion. No increased work of breathing. Cardiovascular system: S1 & S2 heard, irregular. No pedal edema.  Gastrointestinal system: Abdomen is nondistended, soft and nontender. Normal bowel sounds heard. Central nervous system: Alert and oriented. No focal neurological deficits. Extremities: no CCE.  Data Reviewed: Basic Metabolic Panel: Recent Labs  Lab 03/07/18 2326 03/08/18 0123 03/08/18 0756 03/09/18 0644 03/10/18 0621  NA 127* 128* 130* 135 137  K 2.1* 2.3* 3.0* 2.8* 2.8*  CL 68* 67* 69* 77* 78*  CO2 43* 45* 46* 47* 48*  GLUCOSE 202* 208* 263* 283* 245*  BUN 26* 27* 27* 31* 30*  CREATININE 1.45* 1.38* 1.30* 1.18 0.97  CALCIUM 8.4* 8.7* 8.8* 8.8* 9.1  MG  --  1.7  2.6*  --  2.2 1.9  PHOS  --  4.2  --   --   --  Liver Function Tests: No results for input(s): AST, ALT, ALKPHOS, BILITOT, PROT, ALBUMIN in the last 168 hours. No results for input(s): LIPASE, AMYLASE in the last 168 hours. No results for input(s): AMMONIA in the last 168 hours. CBC: Recent Labs  Lab 03/07/18 2326 03/08/18 0756 03/09/18 0644 03/10/18 0621  WBC 15.7* 10.8* 13.1* 10.4  NEUTROABS 12.1*  --  12.1* 9.5*  HGB 10.3* 10.8* 9.7* 9.9*  HCT 33.4* 34.6* 32.3* 32.3*  MCV 79.0 81.0 80.8 81.8  PLT 271 294 262 270   Cardiac Enzymes: Recent Labs  Lab 03/07/18 2326  TROPONINI <0.03   CBG (last 3)  Recent Labs    03/09/18 1558 03/09/18 2116 03/10/18 0734   GLUCAP 268* 156* 251*   Recent Results (from the past 240 hour(s))  MRSA PCR Screening     Status: None   Collection Time: 03/08/18  1:59 AM  Result Value Ref Range Status   MRSA by PCR NEGATIVE NEGATIVE Final    Comment:        The GeneXpert MRSA Assay (FDA approved for NASAL specimens only), is one component of a comprehensive MRSA colonization surveillance program. It is not intended to diagnose MRSA infection nor to guide or monitor treatment for MRSA infections. Performed at [facility], [address], [place], [place] [address]      Studies: No results found. Scheduled Meds: . apixaban  5 mg Oral BID  . diltiazem  300 mg Oral Daily  . feeding supplement (ENSURE ENLIVE)  237 mL Oral BID BM  . furosemide  40 mg Oral Daily  . insulin aspart  0-20 Units Subcutaneous TID WC  . insulin aspart  12 Units Subcutaneous TID WC  . insulin glargine  36 Units Subcutaneous Daily  . ipratropium-albuterol  3 mL Nebulization Q4H  . mouth rinse  15 mL Mouth Rinse BID  . methylPREDNISolone (SOLU-MEDROL) injection  40 mg Intravenous Q6H  . montelukast  10 mg Oral QHS  . multivitamin with minerals  1 tablet Oral Daily  . nicotine  21 mg Transdermal Daily  . pantoprazole  40 mg Oral Daily  . potassium chloride  40 mEq Oral BID  . rosuvastatin  20 mg Oral Daily   Continuous Infusions: . sodium chloride 10 mL/hr at 03/10/18 0835  . doxycycline (VIBRAMYCIN) IV 100 mg (03/10/18 0831)    Principal Problem:   Acute on chronic respiratory failure (HCC) Active Problems:   Diabetes mellitus (HCC)   HLD (hyperlipidemia)   Essential hypertension   Coronary atherosclerosis   ATRIAL FIBRILLATION   CHRONIC OBSTRUCTIVE PULMONARY DISEASE, ACUTE EXACERBATION   Hypokalemia   Hyponatremia   Acute respiratory failure with hypoxia (HCC)  Irwin Brakeman, MD, FAAFP [facility] Pager [phone]  If 7PM-7AM, please contact night-coverage [email or web address] Password [email or web address] 03/10/2018,  10:34 AM    LOS: 2 days

## 2018-03-11 ENCOUNTER — Encounter (HOSPITAL_COMMUNITY): Payer: Self-pay | Admitting: Family Medicine

## 2018-03-11 LAB — GLUCOSE, CAPILLARY
GLUCOSE-CAPILLARY: 291 mg/dL — AB (ref 70–99)
Glucose-Capillary: 338 mg/dL — ABNORMAL HIGH (ref 70–99)

## 2018-03-11 LAB — BASIC METABOLIC PANEL
ANION GAP: 11 (ref 5–15)
BUN: 23 mg/dL (ref 8–23)
CALCIUM: 9.2 mg/dL (ref 8.9–10.3)
CO2: 45 mmol/L — ABNORMAL HIGH (ref 22–32)
Chloride: 82 mmol/L — ABNORMAL LOW (ref 98–111)
Creatinine, Ser: 0.86 mg/dL (ref 0.61–1.24)
GFR calc Af Amer: >60 mL/min (ref >60)
Glucose, Bld: 279 mg/dL — ABNORMAL HIGH (ref 70–99)
POTASSIUM: 3.2 mmol/L — AB (ref 3.5–5.1)
SODIUM: 138 mmol/L (ref 135–145)

## 2018-03-11 LAB — MAGNESIUM: MAGNESIUM: 2.3 mg/dL (ref 1.7–2.4)

## 2018-03-11 MED ORDER — PREDNISONE 20 MG PO TABS: ORAL_TABLET | ORAL | 0 refills | Status: DC

## 2018-03-11 MED ORDER — POTASSIUM CHLORIDE CRYS ER 20 MEQ PO TBCR: 20.0000 meq | EXTENDED_RELEASE_TABLET | Freq: Every day | ORAL | 0 refills | Status: AC

## 2018-03-11 MED ORDER — ALBUTEROL SULFATE (2.5 MG/3ML) 0.083% IN NEBU
INHALATION_SOLUTION | RESPIRATORY_TRACT | Status: AC
  Administered 2018-03-11: 5 mg
  Filled 2018-03-11: qty 6

## 2018-03-11 MED ORDER — DOXYCYCLINE HYCLATE 100 MG PO CAPS: 100.0000 mg | ORAL_CAPSULE | Freq: Two times a day (BID) | ORAL | 0 refills | Status: AC

## 2018-03-11 MED ORDER — FLUTICASONE-SALMETEROL 500-50 MCG/DOSE IN AEPB: 1.0000 | INHALATION_SPRAY | Freq: Two times a day (BID) | RESPIRATORY_TRACT | 0 refills | Status: DC

## 2018-03-11 NOTE — Care Management Note (Signed)
Case Management Note  Patient Details  Name: Andrew [name] MRN: [id number] Date of Birth: Mar 09, 1941  Subjective/Objective:                 Noted patient active w [facility] for [facility] services pta, will resume, Jermaine notified of DC.    Action/Plan:   Expected Discharge Date:  03/11/18               Expected Discharge Plan:  [place]  In-House Referral:     Discharge planning Services  CM Consult  Post Acute Care Choice:  Home Health, Resumption of Svcs/PTA Provider Choice offered to:     DME Arranged:    DME Agency:     HH Arranged:  RN, PT [place] Agency:  [place]  Status of Service:  Completed, signed off  If discussed at [place] of Stay Meetings, dates discussed:    Additional Comments:  Carles Collet, RN 03/11/2018, 1:06 PM

## 2018-03-11 NOTE — Progress Notes (Signed)
Patient has had a difficult time sleeping tonight. Removed BiPAP and placed back on oxygen

## 2018-03-11 NOTE — Progress Notes (Signed)
Patient discharged home with personal belonging and prescriptions.  IV removed and site intact. Patient daughter Andrew [name], to return oxygen tank to [facility] tomorrow 03/12/18. Copy of her driver's license is at 014 nurse station by Network engineer.

## 2018-03-11 NOTE — Progress Notes (Signed)
Patient became very short of  Breath while taking bath. Given extra neb treatments. And placed back on BiPAP.

## 2018-03-11 NOTE — Discharge Summary (Addendum)
Physician Discharge Summary  Andrew [name]:[id number] DOB: 1941-01-23 DOA: 03/07/2018  PCP: [facility], [facility]  Admit date: 03/07/2018 Discharge date: 03/11/2018  Admitted From: Home  Disposition: Home   Recommendations for Outpatient Follow-up:  1. Follow up with PCP in 3-5 days for recheck 2. Please obtain BMP/CBC on outpatient follow up  3. Please follow up on the following pending results:final culture results  Discharge Condition: Guarded  CODE STATUS: FULL    Brief Hospitalization Summary: Please see all hospital notes, images, labs for full details of the hospitalization.  Brief Admission Hx: Andrew [name] a 77 y.o.malewith below past medical history chronic atrial fibrillation, abdominal pain, colon polyps, osteoarthritis, asthma/COPD, CAD, history of skin cancer of the nose, bilateral cataracts, unspecified CHF, chronic lower back pain, esophageal stricture, diverticulosis of colon, GERD, history of SBO, hyperlipidemia, hypertension, BPH, perirectal abscess, urolithiasis, history of stroke who is coming to the emergency department due to shortness of breath s associated with productive cough for the past 3 days.   MDM/Assessment & Plan: 1. Acute on chronic respiratory failure with hypoxia - secondary to COPD acute exacerbation - Treated with IV solumedrol, scheduled duonebs, supplemental oxygen and antibiotics.  He was very slow to improve and his COPD is severe.  Continue current therapy.  Resumed palliative morphine for SOB symptoms as he takes at home.  I wanted patient to stay another day but he refused saying that he was going home today and would not stay another night in hospital.  I asked him to follow up with PCP at [facility] as soon as possible. He verbalized understanding of the risks.  He is high risk for readmission. PCP.  2. Type 2 DM with hyperglycemia - steroid induced - Resume home treatment plan and home sliding scale insulin coverage.  Follow up with PCP in next  3-5 days.  3. HLD - continue crestor 20 mg daily.   4. HTN - continue tiazac 300 mg daily and monitor.  5. Chronic Atrial fibrillation - continue eliquis and Tiazac.  6. CAD - stable.  7. Hypokalemia - Repleted. Additional potassium ordered.  Additional Mg ordered.  Follow.  8. Hyponatremia - improved.   9. Anemia of chronic disease - hg holding at around 10.    DVT prophylaxis: eliquis Code Status: Full  Family Communication: son came to visit Disposition Plan: home   Discharge Diagnoses:  Principal Problem:   Acute on chronic respiratory failure ([place]) Active Problems:   Diabetes mellitus ([place])   HLD (hyperlipidemia)   Essential hypertension   Coronary atherosclerosis   ATRIAL FIBRILLATION   CHRONIC OBSTRUCTIVE PULMONARY DISEASE, ACUTE EXACERBATION   Hypokalemia   Hyponatremia   Acute respiratory failure with hypoxia [facility]  Discharge Instructions: Discharge Instructions    Call MD for:  extreme fatigue   Complete by:  As directed    Call MD for:  persistant dizziness or light-headedness   Complete by:  As directed    Increase activity slowly   Complete by:  As directed      Allergies as of 03/11/2018      Reactions   Procaine Shortness Of Breath, Other (See Comments)   Syncope.    Procaine Hcl Anaphylaxis, Swelling      Medication List    TAKE these medications   acetaminophen 650 MG CR tablet Commonly known as:  TYLENOL Take 650 mg by mouth every 8 (eight) hours as needed for pain.   PROAIR RESPICLICK 378 (90 Base) MCG/ACT Aepb Generic drug:  Albuterol Sulfate Inhale 2 puffs into the lungs daily.   albuterol (2.5 MG/3ML) 0.083% nebulizer solution Commonly known as:  PROVENTIL Take 3 mLs (2.5 mg total) by nebulization every 4 (four) hours as needed for wheezing or shortness of breath.   ALPRAZolam 0.25 MG tablet Commonly known as:  XANAX Take 1 tablet (0.25 mg total) by mouth 3 (three) times daily as needed for anxiety.   apixaban 5 MG Tabs  tablet Commonly known as:  ELIQUIS Take 1 tablet (5 mg total) by mouth 2 (two) times daily. Restart after 5days on 6/9   cyclobenzaprine 10 MG tablet Commonly known as:  FLEXERIL Take 5 mg by mouth at bedtime.   diltiazem 300 MG 24 hr capsule Commonly known as:  TIAZAC Take 1 capsule (300 mg total) by mouth daily.   diphenhydrAMINE 25 MG tablet Commonly known as:  BENADRYL Take 1 tablet by mouth at bedtime.   docusate sodium 100 MG capsule Commonly known as:  COLACE Take 100 mg by mouth daily.   doxycycline 100 MG capsule Commonly known as:  VIBRAMYCIN Take 1 capsule (100 mg total) by mouth 2 (two) times daily for 7 days.   Fluticasone-Salmeterol 500-50 MCG/DOSE Aepb Commonly known as:  ADVAIR DISKUS Inhale 1 puff into the lungs 2 (two) times daily. What changed:    when to take this  reasons to take this   furosemide 40 MG tablet Commonly known as:  LASIX Take 1 tablet (40 mg total) by mouth daily.   glipiZIDE 5 MG 24 hr tablet Commonly known as:  GLUCOTROL XL Take 1 tablet (5 mg total) by mouth daily.   insulin aspart 100 UNIT/ML FlexPen Commonly known as:  NOVOLOG Check CBG's there etimes daily before meals. For CBG less than 150: 0 Units For CBG 150-200: 2 Units SQ For CBG 200-250: 4 Units SQ For CBG 250-300: 6 Units SQ For CBG 300-350: 8 Units SQ and call PCP for further instructions What changed:    how much to take  how to take this  when to take this  additional instructions   insulin glargine 100 unit/mL Sopn Commonly known as:  LANTUS Inject 0.25 mLs (25 Units total) into the skin daily. What changed:  when to take this   Melatonin 3 MG Tabs Take 1 tablet by mouth at bedtime.   metoCLOPramide 10 MG tablet Commonly known as:  REGLAN Take 10 mg by mouth every 6 (six) hours as needed for nausea.   montelukast 10 MG tablet Commonly known as:  SINGULAIR Take 10 mg by mouth at bedtime.   morphine CONCENTRATE 10 mg / 0.5 ml concentrated  solution Take 0.25 mLs by mouth every 3 (three) hours as needed for shortness of breath.   omeprazole 40 MG capsule Commonly known as:  PRILOSEC Take 40 mg by mouth daily.   potassium chloride SA 20 MEQ tablet Commonly known as:  K-DUR,KLOR-CON Take 1 tablet (20 mEq total) by mouth daily.   predniSONE 20 MG tablet Commonly known as:  DELTASONE Take 3 PO QAM x3days, 2 PO QAM x3days, 1 PO QAM x3days Start taking on:  03/12/2018   rosuvastatin 20 MG tablet Commonly known as:  CRESTOR Take 1 tablet (20 mg total) by mouth daily.   tiotropium 18 MCG inhalation capsule Commonly known as:  SPIRIVA HANDIHALER Place 1 capsule (18 mcg total) into inhaler and inhale daily.   tiZANidine 4 MG tablet Commonly known as:  ZANAFLEX Take 4 mg by mouth 3 (three) times  daily.   vitamin B-12 500 MCG tablet Commonly known as:  CYANOCOBALAMIN Take 500 mcg by mouth 2 (two) times daily.      Follow-up Information    Clinic, [place]. Schedule an appointment as soon as possible for a visit in 1 week(s).   Contact information: [place] [address] [phone]          Allergies  Allergen Reactions  . Procaine Shortness Of Breath and Other (See Comments)    Syncope.   . Procaine Hcl Anaphylaxis and Swelling   Allergies as of 03/11/2018      Reactions   Procaine Shortness Of Breath, Other (See Comments)   Syncope.    Procaine Hcl Anaphylaxis, Swelling      Medication List    TAKE these medications   acetaminophen 650 MG CR tablet Commonly known as:  TYLENOL Take 650 mg by mouth every 8 (eight) hours as needed for pain.   PROAIR RESPICLICK 831 (90 Base) MCG/ACT Aepb Generic drug:  Albuterol Sulfate Inhale 2 puffs into the lungs daily.   albuterol (2.5 MG/3ML) 0.083% nebulizer solution Commonly known as:  PROVENTIL Take 3 mLs (2.5 mg total) by nebulization every 4 (four) hours as needed for wheezing or shortness of breath.   ALPRAZolam  0.25 MG tablet Commonly known as:  XANAX Take 1 tablet (0.25 mg total) by mouth 3 (three) times daily as needed for anxiety.   apixaban 5 MG Tabs tablet Commonly known as:  ELIQUIS Take 1 tablet (5 mg total) by mouth 2 (two) times daily. Restart after 5days on 6/9   cyclobenzaprine 10 MG tablet Commonly known as:  FLEXERIL Take 5 mg by mouth at bedtime.   diltiazem 300 MG 24 hr capsule Commonly known as:  TIAZAC Take 1 capsule (300 mg total) by mouth daily.   diphenhydrAMINE 25 MG tablet Commonly known as:  BENADRYL Take 1 tablet by mouth at bedtime.   docusate sodium 100 MG capsule Commonly known as:  COLACE Take 100 mg by mouth daily.   doxycycline 100 MG capsule Commonly known as:  VIBRAMYCIN Take 1 capsule (100 mg total) by mouth 2 (two) times daily for 7 days.   Fluticasone-Salmeterol 500-50 MCG/DOSE Aepb Commonly known as:  ADVAIR DISKUS Inhale 1 puff into the lungs 2 (two) times daily. What changed:    when to take this  reasons to take this   furosemide 40 MG tablet Commonly known as:  LASIX Take 1 tablet (40 mg total) by mouth daily.   glipiZIDE 5 MG 24 hr tablet Commonly known as:  GLUCOTROL XL Take 1 tablet (5 mg total) by mouth daily.   insulin aspart 100 UNIT/ML FlexPen Commonly known as:  NOVOLOG Check CBG's there etimes daily before meals. For CBG less than 150: 0 Units For CBG 150-200: 2 Units SQ For CBG 200-250: 4 Units SQ For CBG 250-300: 6 Units SQ For CBG 300-350: 8 Units SQ and call PCP for further instructions What changed:    how much to take  how to take this  when to take this  additional instructions   insulin glargine 100 unit/mL Sopn Commonly known as:  LANTUS Inject 0.25 mLs (25 Units total) into the skin daily. What changed:  when to take this   Melatonin 3 MG Tabs Take 1 tablet by mouth at bedtime.   metoCLOPramide 10 MG tablet Commonly known as:  REGLAN Take 10 mg by mouth every 6 (six) hours as needed for  nausea.   montelukast 10 MG tablet Commonly known as:  SINGULAIR Take 10 mg by mouth at bedtime.   morphine CONCENTRATE 10 mg / 0.5 ml concentrated solution Take 0.25 mLs by mouth every 3 (three) hours as needed for shortness of breath.   omeprazole 40 MG capsule Commonly known as:  PRILOSEC Take 40 mg by mouth daily.   potassium chloride SA 20 MEQ tablet Commonly known as:  K-DUR,KLOR-CON Take 1 tablet (20 mEq total) by mouth daily.   predniSONE 20 MG tablet Commonly known as:  DELTASONE Take 3 PO QAM x3days, 2 PO QAM x3days, 1 PO QAM x3days Start taking on:  03/12/2018   rosuvastatin 20 MG tablet Commonly known as:  CRESTOR Take 1 tablet (20 mg total) by mouth daily.   tiotropium 18 MCG inhalation capsule Commonly known as:  SPIRIVA HANDIHALER Place 1 capsule (18 mcg total) into inhaler and inhale daily.   tiZANidine 4 MG tablet Commonly known as:  ZANAFLEX Take 4 mg by mouth 3 (three) times daily.   vitamin B-12 500 MCG tablet Commonly known as:  CYANOCOBALAMIN Take 500 mcg by mouth 2 (two) times daily.       Procedures/Studies: Dg Chest Portable 1 View  Result Date: 03/07/2018 CLINICAL DATA:  Shortness of breath and cough for 3 days. EXAM: PORTABLE CHEST 1 VIEW COMPARISON:  02/03/2018 FINDINGS: Borderline heart size and pulmonary vascularity are likely normal for technique. Probable atelectasis in the left lung base similar to previous study. No focal consolidation. No blunting of costophrenic angles. No pneumothorax. Mediastinal contours appear intact. IMPRESSION: Atelectasis in the left lung base similar to previous study. No focal consolidation. Electronically Signed   By: Lucienne Capers M.D.   On: 03/07/2018 23:41     Subjective: Pt says that he wants to go home today, not willing to stay another day.    Discharge Exam: Vitals:   03/11/18 0550 03/11/18 0751  BP: (!) 148/79   Pulse: 82   Resp: 12   Temp: (!) 97.5 F (36.4 C)   SpO2: 96% 96%   Vitals:    03/11/18 0427 03/11/18 0437 03/11/18 0550 03/11/18 0751  BP:   (!) 148/79   Pulse:  65 82   Resp:  20 12   Temp:   (!) 97.5 F (36.4 C)   TempSrc:   Oral   SpO2: 98% 99% 96% 96%  Weight:      Height:       General exam: awake, alert, NAD. Cooperative.  Respiratory system: much better air movement, rare rales, fewer expiratory wheezes and upper chest congestion. No increased work of breathing. Cardiovascular system: S1 & S2 heard, irregular. No pedal edema.  Gastrointestinal system: Abdomen is nondistended, soft and nontender. Normal bowel sounds heard. Central nervous system: Alert and oriented. No focal neurological deficits. Extremities: no CCE.  The results of significant diagnostics from this hospitalization (including imaging, microbiology, ancillary and laboratory) are listed below for reference.    Microbiology: Recent Results (from the past 240 hour(s))  MRSA PCR Screening     Status: None   Collection Time: 03/08/18  1:59 AM  Result Value Ref Range Status   MRSA by PCR NEGATIVE NEGATIVE Final    Comment:        The GeneXpert MRSA Assay (FDA approved for NASAL specimens only), is one component of a comprehensive MRSA colonization surveillance program. It is not intended to diagnose MRSA infection nor to guide or monitor treatment for MRSA infections. Performed at  [address] [place], [place] [address]      Labs: BNP (last 3 results) Recent Labs    11/17/17 1231 12/26/17 1335 03/07/18 2326  BNP 87.0 119.0* 209.4*   Basic Metabolic Panel: Recent Labs  Lab 03/08/18 0123 03/08/18 0756 03/09/18 0644 03/10/18 0621 03/11/18 0639  NA 128* 130* 135 137 138  K 2.3* 3.0* 2.8* 2.8* 3.2*  CL 67* 69* 77* 78* 82*  CO2 45* 46* 47* 48* 45*  GLUCOSE 208* 263* 283* 245* 279*  BUN 27* 27* 31* 30* 23  CREATININE 1.38* 1.30* 1.18 0.97 0.86  CALCIUM 8.7* 8.8* 8.8* 9.1 9.2  MG 1.7  2.6*  --  2.2 1.9 2.3  PHOS 4.2  --   --   --   --    Liver  Function Tests: No results for input(s): AST, ALT, ALKPHOS, BILITOT, PROT, ALBUMIN in the last 168 hours. No results for input(s): LIPASE, AMYLASE in the last 168 hours. No results for input(s): AMMONIA in the last 168 hours. CBC: Recent Labs  Lab 03/07/18 2326 03/08/18 0756 03/09/18 0644 03/10/18 0621  WBC 15.7* 10.8* 13.1* 10.4  NEUTROABS 12.1*  --  12.1* 9.5*  HGB 10.3* 10.8* 9.7* 9.9*  HCT 33.4* 34.6* 32.3* 32.3*  MCV 79.0 81.0 80.8 81.8  PLT 271 294 262 270   Cardiac Enzymes: Recent Labs  Lab 03/07/18 2326  TROPONINI <0.03   BNP: Invalid input(s): POCBNP CBG: Recent Labs  Lab 03/10/18 0734 03/10/18 1130 03/10/18 2056 03/11/18 0716 03/11/18 1112  GLUCAP 251* 261* 303* 338* 291*   D-Dimer No results for input(s): DDIMER in the last 72 hours. Hgb A1c No results for input(s): HGBA1C in the last 72 hours. Lipid Profile No results for input(s): CHOL, HDL, LDLCALC, TRIG, CHOLHDL, LDLDIRECT in the last 72 hours. Thyroid function studies No results for input(s): TSH, T4TOTAL, T3FREE, THYROIDAB in the last 72 hours.  Invalid input(s): FREET3 Anemia work up No results for input(s): VITAMINB12, FOLATE, FERRITIN, TIBC, IRON, RETICCTPCT in the last 72 hours. Urinalysis    Component Value Date/Time   COLORURINE AMBER (A) 05/05/2013 1621   APPEARANCEUR CLEAR 05/05/2013 1621   LABSPEC 1.025 05/05/2013 1621   PHURINE 5.5 05/05/2013 1621   GLUCOSEU NEGATIVE 05/05/2013 1621   HGBUR NEGATIVE 05/05/2013 1621   HGBUR negative 03/31/2009 1514   BILIRUBINUR SMALL (A) 05/05/2013 1621   KETONESUR NEGATIVE 05/05/2013 1621   PROTEINUR NEGATIVE 05/05/2013 1621   UROBILINOGEN 1.0 05/05/2013 1621   NITRITE NEGATIVE 05/05/2013 1621   LEUKOCYTESUR NEGATIVE 05/05/2013 1621   Sepsis Labs Invalid input(s): PROCALCITONIN,  WBC,  LACTICIDVEN Microbiology Recent Results (from the past 240 hour(s))  MRSA PCR Screening     Status: None   Collection Time: 03/08/18  1:59 AM  Result  Value Ref Range Status   MRSA by PCR NEGATIVE NEGATIVE Final    Comment:        The GeneXpert MRSA Assay (FDA approved for NASAL specimens only), is one component of a comprehensive MRSA colonization surveillance program. It is not intended to diagnose MRSA infection nor to guide or monitor treatment for MRSA infections. Performed at [facility], [address] [place], [place] [address]    Time coordinating discharge: 32 minutes  SIGNED:  Irwin Brakeman, MD  [facility] 03/11/2018, 1:05 PM Pager [phone]  If 7PM-7AM, please contact night-coverage [email or web address] Password [email or web address]

## 2018-03-11 NOTE — Progress Notes (Signed)
Patient wears some form of CPAP/ BiPAP at home. Suspect it is BiPAP the way he describes pressures. Have changed from CPAP mode to BiPAP 14/6 with 4 liters oxygen mixed in. Will continue to monitor.

## 2018-03-11 NOTE — Discharge Instructions (Signed)
Seek medical care or return to ER if symptoms worsen or new problem develops.    Follow with Primary MD  Cl[facility], [facility]  and other consultant's as instructed your Hospitalist MD  Please get a complete blood count and chemistry panel checked by your Primary MD at your next visit, and again as instructed by your Primary MD.  Get Medicines reviewed and adjusted: Please take all your medications with you for your next visit with your Primary MD  Laboratory/radiological data: Please request your Primary MD to go over all hospital tests and procedure/radiological results at the follow up, please ask your Primary MD to get all Hospital records sent to his/her office.  In some cases, they will be blood work, cultures and biopsy results pending at the time of your discharge. Please request that your primary care M.D. follows up on these results.  Also Note the following: If you experience worsening of your admission symptoms, develop shortness of breath, life threatening emergency, suicidal or homicidal thoughts you must seek medical attention immediately by calling 911 or calling your MD immediately  if symptoms less severe.  You must read complete instructions/literature along with all the possible adverse reactions/side effects for all the Medicines you take and that have been prescribed to you. Take any new Medicines after you have completely understood and accpet all the possible adverse reactions/side effects.   Do not drive when taking Pain medications or sleeping medications (Benzodaizepines)  Do not take more than prescribed Pain, Sleep and Anxiety Medications. It is not advisable to combine anxiety,sleep and pain medications without talking with your primary care practitioner  Special Instructions: If you have smoked or chewed Tobacco  in the last 2 yrs please stop smoking, stop any regular Alcohol  and or any Recreational drug use.  Wear Seat belts while driving.  Please  note: You were cared for by a hospitalist during your hospital stay. Once you are discharged, your primary care physician will handle any further medical issues. Please note that NO REFILLS for any discharge medications will be authorized once you are discharged, as it is imperative that you return to your primary care physician (or establish a relationship with a primary care physician if you do not have one) for your post hospital discharge needs so that they can reassess your need for medications and monitor your lab values.   Acute Respiratory Distress Syndrome, Adult Acute respiratory distress syndrome is a life-threatening condition in which fluid collects in the lungs. This prevents the lungs from filling with air and passing oxygen into the blood. This can cause the lungs and other vital organs to fail. The condition usually develops following an infection, illness, surgery, or injury. What are the causes? This condition may be caused by:  An infection, such as sepsis or pneumonia.  A serious injury to the head or chest.  Severe bleeding from an injury.  A major surgery.  Breathing in harmful chemicals or smoke.  Blood transfusions.  A blood clot in the lungs.  Breathing in vomit (aspiration).  Near-drowning.  Inflammation of the pancreas (pancreatitis).  A drug overdose.  What are the signs or symptoms? Sudden shortness of breath and rapid breathing are the main symptoms of this condition. Other symptoms may include:  A fast or irregular heartbeat.  Skin, lips, or fingernails that look blue (cyanosis).  Confusion.  Tiredness or loss of energy.  Chest pain, particularly while taking a breath.  Coughing.  Restlessness or anxiety.  Fever. This  is usually present if there is an underlying infection, such as pneumonia.  How is this diagnosed? This condition is diagnosed based on:  Your symptoms.  Medical history.  A physical exam. During the exam, your  health care provider will listen to your heart and check for crackling or wheezing sounds in your lungs.  You may also have other tests to confirm the diagnosis and measure how well your lungs are working. These may include:  Measuring the amount of oxygen in your blood. Your health care provider will use two methods to do this procedure: ? A small device (pulse oximeter) that is placed on your finger, earlobe, or toe. ? An arterial blood gas test. A sample of blood is taken from an artery and tested for oxygen levels.  Blood tests.  Chest X-rays or CT scans to look for fluid in the lungs.  Taking a sample of your sputum to test for infection.  Heart test, such as an echocardiogram or electrocardiogram. This is done to rule out any heart problems (such as heart failure) that may be causing your symptoms.  Bronchoscopy. During this test, a thin, flexible tube with a light is passed into the mouth or nose, down the windpipe, and into the lungs.  How is this treated? Treatment depends on the cause of your condition. The goal is to support you while your lungs heal and the underlying cause is treated. Treatment may include:  Oxygen therapy. This may be done through: ? A tube in your nose or a face mask. ? A ventilator. This device helps move air into and out of your lungs through a breathing tube that is inserted into your mouth or nose.  Continuous positive airway pressure (CPAP). This treatment uses mild air pressure to keep the airways open. A mask or other device will be placed over your nose or mouth.  Tracheostomy. During this procedure, a small cut is made in your neck to create an opening to your windpipe. A breathing tube is placed directly into your windpipe. The breathing tube is connected to a ventilator. This is done if you have problems with your airway or if you need a ventilator for a long period of time.  Positioning you to lie on your stomach (prone position).  Medicines,  such as: ? Sedatives to help you relax. ? Blood pressure medicines. ? Antibiotics to treat infection. ? Blood thinners to prevent blood clots. ? Diuretics to help prevent excess fluid.  Fluids and nutrients given through an IV tube.  Wearing compression stockings on your legs to prevent blood clots.  Extra corporeal membrane oxygenation (ECMO). This treatment takes blood outside your body, adds oxygen, and removes carbon dioxide. The blood is then returned to your body. This treatment is only used in severe cases.  Follow these instructions at home:  Take over-the-counter and prescription medicines only as told by your health care provider.  Do not use any products that contain nicotine or tobacco, such as cigarettes and e-cigarettes. If you need help quitting, ask your health care provider.  Limit alcohol intake to no more than 1 drink per day for nonpregnant women and 2 drinks per day for men. One drink equals 12 oz of beer, 5 oz of wine, or 1 oz of hard liquor.  Ask friends and family to help you if daily activities make you tired.  Attend any pulmonary rehabilitation as told by your health care provider. This may include: ? Education about your condition. ? Exercises. ?  Breathing training. ? Counseling. ? Learning techniques to conserve energy. ? Nutrition counseling.  Keep all follow-up visits as told by your health care provider. This is important. Contact a health care provider if:  You become short of breath during activity or while resting.  You develop a cough that does not go away.  You have a fever.  Your symptoms do not get better or they get worse.  You become anxious or depressed. Get help right away if:  You have sudden shortness of breath.  You develop sudden chest pain that does not go away.  You develop a rapid heart rate.  You develop swelling or pain in one of your legs.  You cough up blood.  You have trouble breathing.  Your skin, lips,  or fingernails turn blue. These symptoms may represent a serious problem that is an emergency. Do not wait to see if the symptoms will go away. Get medical help right away. Call your local emergency services (911 in the [place] Do not drive yourself to the hospital. Summary  Acute respiratory distress syndrome is a life-threatening condition in which fluid collects in the lungs, which leads the lungs and other vital organs to fail.  This condition usually develops following an infection, illness, surgery, or injury.  Sudden shortness of breath and rapid breathing are the main symptoms of acute respiratory distress syndrome.  Treatment may include oxygen therapy, continuous positive airway pressure (CPAP), tracheostomy, lying on your stomach (prone position), medicines, fluids and nutrients given through an IV tube, compression stockings, and extra corporeal membrane oxygenation (ECMO). This information is not intended to replace advice given to you by your health care provider. Make sure you discuss any questions you have with your health care provider. Document Released: 08/22/2005 Document Revised: 08/08/2016 Document Reviewed: 08/08/2016 Elsevier Interactive Patient Education  2017 [employer].

## 2018-03-12 LAB — GLUCOSE, CAPILLARY: Glucose-Capillary: 235 mg/dL — ABNORMAL HIGH (ref 70–99)

## 2018-04-11 ENCOUNTER — Inpatient Hospital Stay (HOSPITAL_COMMUNITY)
Admission: EM | Admit: 2018-04-11 | Discharge: 2018-04-14 | DRG: 190 | Disposition: A | Discharge status: Hospice | Payer: Medicare Other | Attending: Internal Medicine | Admitting: Internal Medicine

## 2018-04-11 ENCOUNTER — Emergency Department (HOSPITAL_COMMUNITY): Payer: Medicare Other

## 2018-04-11 ENCOUNTER — Encounter (HOSPITAL_COMMUNITY): Payer: Self-pay | Admitting: Emergency Medicine

## 2018-04-11 ENCOUNTER — Other Ambulatory Visit: Payer: Self-pay

## 2018-04-11 DIAGNOSIS — E119 Type 2 diabetes mellitus without complications: Secondary | ICD-10-CM

## 2018-04-11 DIAGNOSIS — F17210 Nicotine dependence, cigarettes, uncomplicated: Secondary | ICD-10-CM | POA: Diagnosis present

## 2018-04-11 DIAGNOSIS — I4891 Unspecified atrial fibrillation: Secondary | ICD-10-CM | POA: Diagnosis present

## 2018-04-11 DIAGNOSIS — J441 Chronic obstructive pulmonary disease with (acute) exacerbation: Principal | ICD-10-CM | POA: Diagnosis present

## 2018-04-11 DIAGNOSIS — Z8673 Personal history of transient ischemic attack (TIA), and cerebral infarction without residual deficits: Secondary | ICD-10-CM | POA: Diagnosis not present

## 2018-04-11 DIAGNOSIS — M79652 Pain in left thigh: Secondary | ICD-10-CM | POA: Diagnosis not present

## 2018-04-11 DIAGNOSIS — Z7951 Long term (current) use of inhaled steroids: Secondary | ICD-10-CM | POA: Diagnosis not present

## 2018-04-11 DIAGNOSIS — Z79890 Hormone replacement therapy (postmenopausal): Secondary | ICD-10-CM | POA: Diagnosis not present

## 2018-04-11 DIAGNOSIS — I10 Essential (primary) hypertension: Secondary | ICD-10-CM | POA: Diagnosis present

## 2018-04-11 DIAGNOSIS — F419 Anxiety disorder, unspecified: Secondary | ICD-10-CM | POA: Diagnosis not present

## 2018-04-11 DIAGNOSIS — E1165 Type 2 diabetes mellitus with hyperglycemia: Secondary | ICD-10-CM | POA: Diagnosis not present

## 2018-04-11 DIAGNOSIS — Z8249 Family history of ischemic heart disease and other diseases of the circulatory system: Secondary | ICD-10-CM | POA: Diagnosis not present

## 2018-04-11 DIAGNOSIS — I714 Abdominal aortic aneurysm, without rupture, unspecified: Secondary | ICD-10-CM | POA: Diagnosis present

## 2018-04-11 DIAGNOSIS — I110 Hypertensive heart disease with heart failure: Secondary | ICD-10-CM | POA: Diagnosis present

## 2018-04-11 DIAGNOSIS — K219 Gastro-esophageal reflux disease without esophagitis: Secondary | ICD-10-CM | POA: Diagnosis present

## 2018-04-11 DIAGNOSIS — I5032 Chronic diastolic (congestive) heart failure: Secondary | ICD-10-CM | POA: Diagnosis not present

## 2018-04-11 DIAGNOSIS — Z794 Long term (current) use of insulin: Secondary | ICD-10-CM

## 2018-04-11 DIAGNOSIS — Z9981 Dependence on supplemental oxygen: Secondary | ICD-10-CM | POA: Diagnosis not present

## 2018-04-11 DIAGNOSIS — E785 Hyperlipidemia, unspecified: Secondary | ICD-10-CM | POA: Diagnosis not present

## 2018-04-11 DIAGNOSIS — G8929 Other chronic pain: Secondary | ICD-10-CM | POA: Diagnosis present

## 2018-04-11 DIAGNOSIS — J9621 Acute and chronic respiratory failure with hypoxia: Secondary | ICD-10-CM | POA: Diagnosis present

## 2018-04-11 DIAGNOSIS — Z7952 Long term (current) use of systemic steroids: Secondary | ICD-10-CM

## 2018-04-11 DIAGNOSIS — F17200 Nicotine dependence, unspecified, uncomplicated: Secondary | ICD-10-CM | POA: Diagnosis present

## 2018-04-11 DIAGNOSIS — Z833 Family history of diabetes mellitus: Secondary | ICD-10-CM | POA: Diagnosis not present

## 2018-04-11 DIAGNOSIS — Z825 Family history of asthma and other chronic lower respiratory diseases: Secondary | ICD-10-CM

## 2018-04-11 DIAGNOSIS — I480 Paroxysmal atrial fibrillation: Secondary | ICD-10-CM | POA: Diagnosis not present

## 2018-04-11 DIAGNOSIS — Z7901 Long term (current) use of anticoagulants: Secondary | ICD-10-CM | POA: Diagnosis not present

## 2018-04-11 DIAGNOSIS — M545 Low back pain: Secondary | ICD-10-CM | POA: Diagnosis not present

## 2018-04-11 LAB — CBC
HEMATOCRIT: 33.7 % — AB (ref 39.0–52.0)
Hemoglobin: 9.8 g/dL — ABNORMAL LOW (ref 13.0–17.0)
MCH: 22.9 pg — ABNORMAL LOW (ref 26.0–34.0)
MCHC: 29.1 g/dL — ABNORMAL LOW (ref 30.0–36.0)
MCV: 78.7 fL (ref 78.0–100.0)
PLATELETS: 252 10*3/uL (ref 150–400)
RBC: 4.28 MIL/uL (ref 4.22–5.81)
RDW: 19.5 % — ABNORMAL HIGH (ref 11.5–15.5)
WBC: 10.6 10*3/uL — AB (ref 4.0–10.5)

## 2018-04-11 LAB — BASIC METABOLIC PANEL
Anion gap: 9 (ref 5–15)
BUN: 17 mg/dL (ref 8–23)
CHLORIDE: 94 mmol/L — AB (ref 98–111)
CO2: 35 mmol/L — ABNORMAL HIGH (ref 22–32)
Calcium: 8.9 mg/dL (ref 8.9–10.3)
Creatinine, Ser: 0.99 mg/dL (ref 0.61–1.24)
GFR calc non Af Amer: >60 mL/min (ref >60)
Glucose, Bld: 323 mg/dL — ABNORMAL HIGH (ref 70–99)
POTASSIUM: 4.4 mmol/L (ref 3.5–5.1)
SODIUM: 138 mmol/L (ref 135–145)

## 2018-04-11 LAB — BRAIN NATRIURETIC PEPTIDE: B Natriuretic Peptide: 82 pg/mL (ref 0.0–100.0)

## 2018-04-11 LAB — TROPONIN I

## 2018-04-11 MED ORDER — ALBUTEROL SULFATE (2.5 MG/3ML) 0.083% IN NEBU
5.0000 mg | INHALATION_SOLUTION | Freq: Once | RESPIRATORY_TRACT | Status: AC
  Administered 2018-04-11: 5 mg via RESPIRATORY_TRACT
  Filled 2018-04-11: qty 6

## 2018-04-11 MED ORDER — ALPRAZOLAM 0.25 MG PO TABS
0.2500 mg | ORAL_TABLET | Freq: Three times a day (TID) | ORAL | Status: DC | PRN
  Administered 2018-04-12 – 2018-04-14 (×6): 0.25 mg via ORAL
  Filled 2018-04-11 (×6): qty 1

## 2018-04-11 MED ORDER — ALBUTEROL SULFATE (2.5 MG/3ML) 0.083% IN NEBU
2.5000 mg | INHALATION_SOLUTION | RESPIRATORY_TRACT | Status: DC | PRN
  Administered 2018-04-12 (×2): 2.5 mg via RESPIRATORY_TRACT
  Filled 2018-04-11: qty 3

## 2018-04-11 MED ORDER — IPRATROPIUM BROMIDE 0.02 % IN SOLN: 0.5000 mg | Freq: Four times a day (QID) | RESPIRATORY_TRACT | Status: DC

## 2018-04-11 MED ORDER — SODIUM CHLORIDE 0.9 % IV SOLN: 250.0000 mL | INTRAVENOUS | Status: DC | PRN

## 2018-04-11 MED ORDER — APIXABAN 5 MG PO TABS
5.0000 mg | ORAL_TABLET | Freq: Two times a day (BID) | ORAL | Status: DC
  Administered 2018-04-11 – 2018-04-14 (×6): 5 mg via ORAL
  Filled 2018-04-11 (×6): qty 1

## 2018-04-11 MED ORDER — MORPHINE SULFATE (CONCENTRATE) 10 MG/0.5ML PO SOLN
5.0000 mg | ORAL | Status: DC | PRN
  Administered 2018-04-13 (×2): 5 mg via ORAL
  Filled 2018-04-11: qty 4
  Filled 2018-04-11 (×2): qty 0.5

## 2018-04-11 MED ORDER — DILTIAZEM HCL ER BEADS 300 MG PO CP24
300.0000 mg | ORAL_CAPSULE | Freq: Every day | ORAL | Status: DC
  Administered 2018-04-11 – 2018-04-14 (×4): 300 mg via ORAL
  Filled 2018-04-11 (×4): qty 1

## 2018-04-11 MED ORDER — METHYLPREDNISOLONE SODIUM SUCC 125 MG IJ SOLR
80.0000 mg | Freq: Two times a day (BID) | INTRAMUSCULAR | Status: DC
  Administered 2018-04-11 – 2018-04-14 (×6): 80 mg via INTRAVENOUS
  Filled 2018-04-11 (×6): qty 2

## 2018-04-11 MED ORDER — ORAL CARE MOUTH RINSE: 15.0000 mL | Freq: Two times a day (BID) | OROMUCOSAL | Status: DC

## 2018-04-11 MED ORDER — ALBUTEROL (5 MG/ML) CONTINUOUS INHALATION SOLN
10.0000 mg/h | INHALATION_SOLUTION | Freq: Once | RESPIRATORY_TRACT | Status: AC
  Administered 2018-04-11: 10 mg/h via RESPIRATORY_TRACT
  Filled 2018-04-11: qty 20

## 2018-04-11 MED ORDER — IPRATROPIUM BROMIDE 0.02 % IN SOLN
0.5000 mg | Freq: Once | RESPIRATORY_TRACT | Status: AC
  Administered 2018-04-11: 0.5 mg via RESPIRATORY_TRACT
  Filled 2018-04-11: qty 2.5

## 2018-04-11 MED ORDER — IPRATROPIUM-ALBUTEROL 0.5-2.5 (3) MG/3ML IN SOLN
3.0000 mL | Freq: Four times a day (QID) | RESPIRATORY_TRACT | Status: DC
  Administered 2018-04-11 – 2018-04-12 (×4): 3 mL via RESPIRATORY_TRACT
  Filled 2018-04-11 (×4): qty 3

## 2018-04-11 MED ORDER — METHYLPREDNISOLONE SODIUM SUCC 125 MG IJ SOLR
125.0000 mg | Freq: Once | INTRAMUSCULAR | Status: AC
  Administered 2018-04-11: 125 mg via INTRAVENOUS
  Filled 2018-04-11: qty 2

## 2018-04-11 MED ORDER — SODIUM CHLORIDE 0.9% FLUSH: 3.0000 mL | INTRAVENOUS | Status: DC | PRN

## 2018-04-11 MED ORDER — ALBUTEROL SULFATE (2.5 MG/3ML) 0.083% IN NEBU: 2.5000 mg | INHALATION_SOLUTION | Freq: Four times a day (QID) | RESPIRATORY_TRACT | Status: DC

## 2018-04-11 MED ORDER — SODIUM CHLORIDE 0.9% FLUSH
3.0000 mL | Freq: Two times a day (BID) | INTRAVENOUS | Status: DC
  Administered 2018-04-11 – 2018-04-14 (×5): 3 mL via INTRAVENOUS

## 2018-04-11 NOTE — ED Provider Notes (Signed)
[facility] EMERGENCY DEPARTMENT Provider Note   CSN: [id number] Arrival date & time: 04/11/18  1544     History   Chief Complaint Chief Complaint  Patient presents with  . Shortness of Breath    HPI Andrew [name] is a 77 y.o. male.  HPI Patient presents to the emergency room for shortness of breath.  Patient has a history of COPD.  He continues to smoke.  Patient has a history of home oxygen use and uses inhalers.  Patient states of the last couple weeks he has had increasing shortness of breath.  He has been wheezing and has used his inhalers without any significant improvement.  Patient dates his symptoms were worse today and he had to call EMS.  He has not seen anyone for this recent bout of shortness of breath prior to this visit to the ER today.  When EMS arrived they found the patient was not wearing his oxygen and he was outside smoking a cigarette.  The placed him on his home oxygen regimen. Past Medical History:  Diagnosis Date  . A-fib ([place])   . Abdominal pain   . Adenomatous colon polyp   . Arthritis   . Asthma   . CAD (coronary artery disease)   . Cancer ([place])    skin, nose  . Cataract    bilateral  . CHF (congestive heart failure) ([place])    patient reports  . Chronic LBP   . COPD (chronic obstructive pulmonary disease) ([place])   . Decreased libido   . Diverticulosis of colon   . Esophageal stricture   . Fatigue   . GERD (gastroesophageal reflux disease)   . History of small bowel obstruction   . Hyperlipidemia   . Hypertension   . Hypertrophy of prostate with urinary obstruction and other lower urinary tract symptoms (LUTS)   . Palpitations   . Peri-rectal abscess   . Personal history of renal calculi   . Prostatitis, acute   . Stroke [facility])    pt reports  . Tobacco abuse     Patient Active Problem List   Diagnosis Date Noted  . Acute on chronic respiratory failure ([place]) 03/08/2018  . Hypokalemia 03/08/2018  . Hyponatremia 03/08/2018  . Acute  respiratory failure with hypoxia ([place]) 03/08/2018  . GI bleed 02/03/2018  . Acute respiratory failure ([place]) 12/26/2017  . Aspiration pneumonia ([place]) 12/26/2017  . Dysphagia 12/26/2017  . Acute on chronic diastolic CHF (congestive heart failure) ([place]) 12/26/2017  . Acute on chronic respiratory failure with hypoxia ([place]) 11/17/2017  . COPD with exacerbation ([place]) 11/17/2017  . Hospital acquired PNA 05/14/2017  . COPD exacerbation ([place])   . HCAP (healthcare-associated pneumonia)   . Acute respiratory failure with hypoxia and hypercapnia ([place]) 04/12/2017  . Pneumonia 10/24/2016  . AAA (abdominal aortic aneurysm) ([place]) 01/04/2011  . Dizziness, nonspecific 12/26/2010  . UNSPECIFIED PERIPHERAL VASCULAR DISEASE 09/23/2010  . LEG CRAMPS 09/13/2010  . Diabetes mellitus ([place]) 02/11/2010  . CHRONIC OBSTRUCTIVE PULMONARY DISEASE, ACUTE EXACERBATION 02/11/2010  . OLECRANON BURSITIS, RIGHT 01/26/2010  . PENILE PAIN 12/29/2009  . ABNORMAL EJACULATION 05/05/2009  . LARYNGITIS, ACUTE 04/21/2009  . ATRIAL FIBRILLATION 04/08/2009  . PALPITATIONS 04/08/2009  . FATIGUE 03/31/2009  . LIBIDO, DECREASED 03/31/2009  . HYPERTROPHY PROSTATE W/UR OBST & OTH LUTS 03/25/2009  . ACUTE PROSTATITIS 03/25/2009  . SMALL BOWEL OBSTRUCTION, HX OF 11/28/2007  . HLD (hyperlipidemia) 11/13/2007  . COPD 10/02/2007  . LOW BACK PAIN, CHRONIC 10/02/2007  . ADENOMATOUS COLONIC POLYP  05/22/2007  . ESOPHAGEAL STRICTURE 05/22/2007  . Diverticulosis of colon 05/22/2007  . TOBACCO ABUSE 05/08/2007  . Essential hypertension 05/08/2007  . Coronary atherosclerosis 05/08/2007  . ASTHMA 05/08/2007  . GERD 05/08/2007  . RENAL CALCULUS, HX OF 05/08/2007  . ABSCESS, PERIRECTAL, HX OF 05/08/2007  . ARTHRITIS, HX OF 05/08/2007  . PENILE PROSTHESIS 05/08/2007  . LAMINECTOMY, LUMBAR, HX OF 05/08/2007    Past Surgical History:  Procedure Laterality Date  . CATARACT EXTRACTION  04/2010   bilateral  . HERNIA REPAIR    . LUMBAR  LAMINECTOMY     with Harrington rods  . PENILE PROSTHESIS PLACEMENT    . TONSILLECTOMY          Home Medications    Prior to Admission medications   Medication Sig Start Date End Date Taking? Authorizing Provider  acetaminophen (TYLENOL) 650 MG CR tablet Take 650 mg by mouth every 8 (eight) hours as needed for pain.    [provider]  albuterol (PROVENTIL) (2.5 MG/3ML) 0.083% nebulizer solution Take 3 mLs (2.5 mg total) by nebulization every 4 (four) hours as needed for wheezing or shortness of breath. 07/03/17   Arrien, Jimmy Picket, MD  Albuterol Sulfate (PROAIR RESPICLICK) 867 (90 Base) MCG/ACT AEPB Inhale 2 puffs into the lungs daily.    [provider]  ALPRAZolam Duanne Moron) 0.25 MG tablet Take 1 tablet (0.25 mg total) by mouth 3 (three) times daily as needed for anxiety. 07/03/17   Arrien, Jimmy Picket, MD  apixaban (ELIQUIS) 5 MG TABS tablet Take 1 tablet (5 mg total) by mouth 2 (two) times daily. Restart after 5days on 6/9 02/11/18 03/13/18  Domenic Polite, MD  cyclobenzaprine (FLEXERIL) 10 MG tablet Take 5 mg by mouth at bedtime.    [provider]  diltiazem (TIAZAC) 300 MG 24 hr capsule Take 1 capsule (300 mg total) by mouth daily. 07/03/17 02/05/18  Arrien, Jimmy Picket, MD  diphenhydrAMINE (BENADRYL) 25 MG tablet Take 1 tablet by mouth at bedtime.    [provider]  docusate sodium (COLACE) 100 MG capsule Take 100 mg by mouth daily.    [provider]  Fluticasone-Salmeterol (ADVAIR DISKUS) 500-50 MCG/DOSE AEPB Inhale 1 puff into the lungs 2 (two) times daily. 03/11/18 04/10/18  Johnson, Clanford L, MD  furosemide (LASIX) 40 MG tablet Take 1 tablet (40 mg total) by mouth daily. 12/31/17   Johnson, Clanford L, MD  glipiZIDE (GLUCOTROL XL) 5 MG 24 hr tablet Take 1 tablet (5 mg total) by mouth daily. 12/30/17   Johnson, Clanford L, MD  insulin aspart (NOVOLOG) 100 UNIT/ML FlexPen Check CBG's there etimes daily before meals. For CBG less  than 150: 0 Units For CBG 150-200: 2 Units SQ For CBG 200-250: 4 Units SQ For CBG 250-300: 6 Units SQ For CBG 300-350: 8 Units SQ and call PCP for further instructions Patient taking differently: Inject 0-8 Units into the skin See admin instructions. Check CBG's there etimes daily before meals. For CBG less than 150: 0 Units For CBG 150-200: 2 Units SQ For CBG 200-250: 4 Units SQ For CBG 250-300: 6 Units SQ For CBG 300-350: 8 Units SQ and call PCP for further instructions 07/03/17   Arrien, Jimmy Picket, MD  insulin glargine (LANTUS) 100 unit/mL SOPN Inject 0.25 mLs (25 Units total) into the skin daily. Patient taking differently: Inject 25 Units into the skin Nightly.  12/30/17 02/05/18  Johnson, Clanford L, MD  Melatonin 3 MG TABS Take 1 tablet by mouth at bedtime.  [provider]  metoCLOPramide (REGLAN) 10 MG tablet Take 10 mg by mouth every 6 (six) hours as needed for nausea.    [provider]  montelukast (SINGULAIR) 10 MG tablet Take 10 mg by mouth at bedtime.    [provider]  Morphine Sulfate (MORPHINE CONCENTRATE) 10 mg / 0.5 ml concentrated solution Take 0.25 mLs by mouth every 3 (three) hours as needed for shortness of breath. 01/06/18   [provider]  omeprazole (PRILOSEC) 40 MG capsule Take 40 mg by mouth daily.    [provider]  potassium chloride SA (K-DUR,KLOR-CON) 20 MEQ tablet Take 1 tablet (20 mEq total) by mouth daily. 03/11/18 04/10/18  Wynetta Emery, Clanford L, MD  predniSONE (DELTASONE) 20 MG tablet Take 3 PO QAM x3days, 2 PO QAM x3days, 1 PO QAM x3days 03/12/18   Johnson, Clanford L, MD  rosuvastatin (CRESTOR) 20 MG tablet Take 1 tablet (20 mg total) by mouth daily. 07/03/17   Arrien, Jimmy Picket, MD  tiotropium (SPIRIVA HANDIHALER) 18 MCG inhalation capsule Place 1 capsule (18 mcg total) into inhaler and inhale daily. 07/03/17 07/03/18  Arrien, Jimmy Picket, MD  tiZANidine (ZANAFLEX) 4 MG tablet Take 4 mg by mouth 3  (three) times daily.    [provider]  vitamin B-12 (CYANOCOBALAMIN) 500 MCG tablet Take 500 mcg by mouth 2 (two) times daily.    [provider]    Family History Family History  Problem Relation Age of Onset  . Heart disease Brother        CAD  . Diabetes Brother   . COPD Brother   . Emphysema Brother     Social History Social History   Tobacco Use  . Smoking status: Former Smoker    Packs/day: 1.00    Types: Cigarettes    Last attempt to quit: 01/18/2018    Years since quitting: 0.2  . Smokeless tobacco: Never Used  Substance Use Topics  . Alcohol use: No  . Drug use: No     Allergies   Procaine and Procaine hcl   Review of Systems Review of Systems  All other systems reviewed and are negative.    Physical Exam Updated Vital Signs BP 114/62   Pulse 69   Temp 99.3 F (37.4 C) (Oral)   Resp 19   SpO2 100%   Physical Exam  Constitutional:  Non-toxic appearance. No distress.  HENT:  Head: Normocephalic and atraumatic.  Right Ear: External ear normal.  Left Ear: External ear normal.  Eyes: Conjunctivae are normal. Right eye exhibits no discharge. Left eye exhibits no discharge. No scleral icterus.  Neck: Neck supple. No tracheal deviation present.  Cardiovascular: Normal rate, regular rhythm and intact distal pulses.  Pulmonary/Chest: Effort normal. No stridor. No respiratory distress. He has decreased breath sounds. He has wheezes. He has no rales.  Abdominal: Soft. Bowel sounds are normal. He exhibits no distension. There is no tenderness. There is no rebound and no guarding.  Musculoskeletal: He exhibits no tenderness.       Right lower leg: He exhibits edema. He exhibits no tenderness.       Left lower leg: He exhibits edema. He exhibits no tenderness.  Neurological: He is alert. He has normal strength. No cranial nerve deficit (no facial droop, extraocular movements intact, no slurred speech) or sensory deficit. He exhibits normal  muscle tone. He displays no seizure activity. Coordination normal.  Skin: Skin is warm and dry. No rash noted.  Psychiatric: He has a  normal mood and affect.  Nursing note and vitals reviewed.    ED Treatments / Results  Labs (all labs ordered are listed, but only abnormal results are displayed) Labs Reviewed  BASIC METABOLIC PANEL - Abnormal; Notable for the following components:      Result Value   Chloride 94 (*)    CO2 35 (*)    Glucose, Bld 323 (*)    All other components within normal limits  CBC - Abnormal; Notable for the following components:   WBC 10.6 (*)    Hemoglobin 9.8 (*)    HCT 33.7 (*)    MCH 22.9 (*)    MCHC 29.1 (*)    RDW 19.5 (*)    All other components within normal limits  BRAIN NATRIURETIC PEPTIDE  TROPONIN I    EKG EKG Interpretation  Date/Time:  Wednesday April 11 2018 15:51:50 EDT Ventricular Rate:  79 PR Interval:    QRS Duration: 95 QT Interval:  392 QTC Calculation: 450 R Axis:   96 Text Interpretation:  Sinus rhythm Atrial premature complex Right axis deviation Borderline T abnormalities, lateral leads Baseline wander in lead(s) II III aVF No significant change since last tracing Confirmed by Dorie Rank [phone]) on 04/11/2018 4:08:49 PM   Radiology Dg Chest Portable 1 View  Result Date: 04/11/2018 CLINICAL DATA:  Shortness of breath EXAM: PORTABLE CHEST 1 VIEW COMPARISON:  March 07, 2018 FINDINGS: The heart size and mediastinal contours are stable. The lungs are hyperinflated. There is central pulmonary vascular congestion unchanged. No frank pulmonary edema is noted. There is no focal pneumonia or pleural effusion. The visualized skeletal structures are unremarkable. IMPRESSION: COPD. Central pulmonary vascular congestion unchanged compared prior exam. Electronically Signed   By: Abelardo Diesel M.D.   On: 04/11/2018 16:11    Procedures .Critical Care Performed by: Dorie Rank, MD Authorized by: Dorie Rank, MD   Critical care provider  statement:    Critical care time (minutes):  35   Critical care was time spent personally by me on the following activities:  Discussions with consultants, evaluation of patient's response to treatment, examination of patient, ordering and performing treatments and interventions, ordering and review of laboratory studies, ordering and review of radiographic studies, pulse oximetry, re-evaluation of patient's condition, obtaining history from patient or surrogate and review of old charts   (including critical care time)  Medications Ordered in ED Medications  albuterol (PROVENTIL,VENTOLIN) solution continuous neb (10 mg/hr Nebulization Given 04/11/18 1625)  albuterol (PROVENTIL) (2.5 MG/3ML) 0.083% nebulizer solution 5 mg (5 mg Nebulization Given 04/11/18 1607)  ipratropium (ATROVENT) nebulizer solution 0.5 mg (0.5 mg Nebulization Given 04/11/18 1625)  methylPREDNISolone sodium succinate (SOLU-MEDROL) 125 mg/2 mL injection 125 mg (125 mg Intravenous Given 04/11/18 1619)     Initial Impression / Assessment and Plan / ED Course  I have reviewed the triage vital signs and the nursing notes.  Pertinent labs & imaging results that were available during my care of the patient were reviewed by me and considered in my medical decision making (see chart for details).  Clinical Course as of Apr 11 1721  Wed Apr 11, 2018  1715 Pt states he is feeling better but still wheezing.  Does not feel like he needs bipap which he has had in the past   [JK]  1716 Labs reviewed.  Hyperglycemia noted and chronic CO2 elevation.   [JK]  1716   Stable anemia.  Chest x-ray without pneumonia   [JK]    Clinical Course  User Index [JK] Dorie Rank, MD    Patient presented to the emergency room for evaluation of shortness of breath.  Patient has a history of COPD and appears to have frequent visits and hospitalizations.  Patient continues to smoke and does not seem to be using his oxygen the medications appropriately.   Patient's ED work-up is consistent with a recurrent COPD exacerbation.  No evidence of pneumonia or heart failure.  He seems to be improving but still has significant wheezing.  He is alert does not appear to require BiPAP or intubation at this time.  I will consult with the medical service for admission and further treatment.  Final Clinical Impressions(s) / ED Diagnoses   Final diagnoses:  COPD exacerbation ([place])      Dorie Rank, MD 04/11/18 1724

## 2018-04-11 NOTE — ED Notes (Signed)
Pt 02 up to 3L [place]. Pt sleeping

## 2018-04-11 NOTE — ED Triage Notes (Signed)
Pt called ems due to sob. Ems states pt was outside smoking cigarette. 02 ra 86. Pt non compliant with home 02. Ems put pt on 4L and up to 96%. Pt arrived a/o. Sob noted. cbg 286. ble noted. Pt c/o tailbone pain

## 2018-04-11 NOTE — Plan of Care (Signed)
Patient denies pain, will continue to monitor.

## 2018-04-11 NOTE — H&P (Signed)
History and Physical    Andrew [name] QVZ:[id number] DOB: 02-Jul-1941 DOA: 04/11/2018  PCP: [facility]  Patient coming from: Home  Chief Complaint: Shortness of breath  HPI: Andrew [name] is a 77 y.o. male with medical history significant of COPD, chronic respiratory failure, A. fib, hypertension, CHF brought in by EMS for wheezing and shortness of breath.  EMS arrived to his house and he was smoking on his front porch wheezing without his oxygen.  He is not compliant with his home oxygen.  But at least is not smoking with his oxygen.  He denies any fevers.  He says he has been wheezing a whole lot.  He denies any swelling in his legs.  Patient referred for admission for COPD exacerbation.  Review of Systems: As per HPI otherwise 10 point review of systems negative.   Past Medical History:  Diagnosis Date  . A-fib ([place])   . Abdominal pain   . Adenomatous colon polyp   . Arthritis   . Asthma   . CAD (coronary artery disease)   . Cancer ([place])    skin, nose  . Cataract    bilateral  . CHF (congestive heart failure) ([place])    patient reports  . Chronic LBP   . COPD (chronic obstructive pulmonary disease) ([place])   . Decreased libido   . Diverticulosis of colon   . Esophageal stricture   . Fatigue   . GERD (gastroesophageal reflux disease)   . History of small bowel obstruction   . Hyperlipidemia   . Hypertension   . Hypertrophy of prostate with urinary obstruction and other lower urinary tract symptoms (LUTS)   . Palpitations   . Peri-rectal abscess   . Personal history of renal calculi   . Prostatitis, acute   . Stroke [facility])    pt reports  . Tobacco abuse     Past Surgical History:  Procedure Laterality Date  . CATARACT EXTRACTION  04/2010   bilateral  . HERNIA REPAIR    . LUMBAR LAMINECTOMY     with Harrington rods  . PENILE PROSTHESIS PLACEMENT    . TONSILLECTOMY       reports that he quit smoking about 2 months ago. His smoking use included cigarettes.  He smoked 1.00 pack per day. He has never used smokeless tobacco. He reports that he does not drink alcohol or use drugs.  Allergies  Allergen Reactions  . Procaine Shortness Of Breath and Other (See Comments)    Syncope.   . Procaine Hcl Anaphylaxis and Swelling    Family History  Problem Relation Age of Onset  . Heart disease Brother        CAD  . Diabetes Brother   . COPD Brother   . Emphysema Brother     Prior to Admission medications   Medication Sig Start Date End Date Taking? Authorizing Provider  acetaminophen (TYLENOL) 650 MG CR tablet Take 650 mg by mouth every 8 (eight) hours as needed for pain.    [provider]  albuterol (PROVENTIL) (2.5 MG/3ML) 0.083% nebulizer solution Take 3 mLs (2.5 mg total) by nebulization every 4 (four) hours as needed for wheezing or shortness of breath. 07/03/17   Arrien, Jimmy Picket, MD  Albuterol Sulfate (PROAIR RESPICLICK) 329 (90 Base) MCG/ACT AEPB Inhale 2 puffs into the lungs daily.    [provider]  ALPRAZolam Duanne Moron) 0.25 MG tablet Take 1 tablet (0.25 mg total) by mouth 3 (three) times daily as needed for  anxiety. 07/03/17   Arrien, Jimmy Picket, MD  apixaban (ELIQUIS) 5 MG TABS tablet Take 1 tablet (5 mg total) by mouth 2 (two) times daily. Restart after 5days on 6/9 02/11/18 03/13/18  Domenic Polite, MD  cyclobenzaprine (FLEXERIL) 10 MG tablet Take 5 mg by mouth at bedtime.    [provider]  diltiazem (TIAZAC) 300 MG 24 hr capsule Take 1 capsule (300 mg total) by mouth daily. 07/03/17 02/05/18  Arrien, Jimmy Picket, MD  diphenhydrAMINE (BENADRYL) 25 MG tablet Take 1 tablet by mouth at bedtime.    [provider]  docusate sodium (COLACE) 100 MG capsule Take 100 mg by mouth daily.    [provider]  Fluticasone-Salmeterol (ADVAIR DISKUS) 500-50 MCG/DOSE AEPB Inhale 1 puff into the lungs 2 (two) times daily. 03/11/18 04/10/18  Johnson, Clanford L, MD  furosemide (LASIX) 40 MG tablet Take  1 tablet (40 mg total) by mouth daily. 12/31/17   Johnson, Clanford L, MD  glipiZIDE (GLUCOTROL XL) 5 MG 24 hr tablet Take 1 tablet (5 mg total) by mouth daily. 12/30/17   Johnson, Clanford L, MD  insulin aspart (NOVOLOG) 100 UNIT/ML FlexPen Check CBG's there etimes daily before meals. For CBG less than 150: 0 Units For CBG 150-200: 2 Units SQ For CBG 200-250: 4 Units SQ For CBG 250-300: 6 Units SQ For CBG 300-350: 8 Units SQ and call PCP for further instructions Patient taking differently: Inject 0-8 Units into the skin See admin instructions. Check CBG's there etimes daily before meals. For CBG less than 150: 0 Units For CBG 150-200: 2 Units SQ For CBG 200-250: 4 Units SQ For CBG 250-300: 6 Units SQ For CBG 300-350: 8 Units SQ and call PCP for further instructions 07/03/17   Arrien, Jimmy Picket, MD  insulin glargine (LANTUS) 100 unit/mL SOPN Inject 0.25 mLs (25 Units total) into the skin daily. Patient taking differently: Inject 25 Units into the skin Nightly.  12/30/17 02/05/18  Johnson, Clanford L, MD  Melatonin 3 MG TABS Take 1 tablet by mouth at bedtime.    [provider]  metoCLOPramide (REGLAN) 10 MG tablet Take 10 mg by mouth every 6 (six) hours as needed for nausea.    [provider]  montelukast (SINGULAIR) 10 MG tablet Take 10 mg by mouth at bedtime.    [provider]  Morphine Sulfate (MORPHINE CONCENTRATE) 10 mg / 0.5 ml concentrated solution Take 0.25 mLs by mouth every 3 (three) hours as needed for shortness of breath. 01/06/18   [provider]  omeprazole (PRILOSEC) 40 MG capsule Take 40 mg by mouth daily.    [provider]  potassium chloride SA (K-DUR,KLOR-CON) 20 MEQ tablet Take 1 tablet (20 mEq total) by mouth daily. 03/11/18 04/10/18  Wynetta Emery, Clanford L, MD  predniSONE (DELTASONE) 20 MG tablet Take 3 PO QAM x3days, 2 PO QAM x3days, 1 PO QAM x3days 03/12/18   Johnson, Clanford L, MD  rosuvastatin (CRESTOR) 20 MG tablet Take 1  tablet (20 mg total) by mouth daily. 07/03/17   Arrien, Jimmy Picket, MD  tiotropium (SPIRIVA HANDIHALER) 18 MCG inhalation capsule Place 1 capsule (18 mcg total) into inhaler and inhale daily. 07/03/17 07/03/18  Arrien, Jimmy Picket, MD  tiZANidine (ZANAFLEX) 4 MG tablet Take 4 mg by mouth 3 (three) times daily.    [provider]  vitamin B-12 (CYANOCOBALAMIN) 500 MCG tablet Take 500 mcg by mouth 2 (two) times daily.    [provider]    Physical Exam:  Vitals:   04/11/18 1730 04/11/18 1800 04/11/18 1826 04/11/18 1842  BP: (!) 126/55 119/63  136/61  Pulse: 71 (!) 40 85 82  Resp: 19 18 (!) 28 18  Temp:      TempSrc:      SpO2: 100% (!) 87% 95% 95%  Weight:    74 kg (163 lb 2.3 oz)  Height:    5\' 6"  (1.676 m)      Constitutional: NAD, calm, comfortable Vitals:   04/11/18 1730 04/11/18 1800 04/11/18 1826 04/11/18 1842  BP: (!) 126/55 119/63  136/61  Pulse: 71 (!) 40 85 82  Resp: 19 18 (!) 28 18  Temp:      TempSrc:      SpO2: 100% (!) 87% 95% 95%  Weight:    74 kg (163 lb 2.3 oz)  Height:    5\' 6"  (1.676 m)   Eyes: PERRL, lids and conjunctivae normal ENMT: Mucous membranes are moist. Posterior pharynx clear of any exudate or lesions.Normal dentition.  Neck: normal, supple, no masses, no thyromegaly Respiratory: clear to auscultation bilaterally, mild wheezing, no crackles. Normal respiratory effort. No accessory muscle use.  Cardiovascular: Regular rate and rhythm, no murmurs / rubs / gallops. No extremity edema. 2+ pedal pulses. No carotid bruits.  Abdomen: no tenderness, no masses palpated. No hepatosplenomegaly. Bowel sounds positive.  Musculoskeletal: no clubbing / cyanosis. No joint deformity upper and lower extremities. Good ROM, no contractures. Normal muscle tone.  Skin: no rashes, lesions, ulcers. No induration Neurologic: CN 2-12 grossly intact. Sensation intact, DTR normal. Strength 5/5 in all 4.  Psychiatric: Normal judgment and insight.  Alert and oriented x 3. Normal mood.    Labs on Admission: I have personally reviewed following labs and imaging studies  CBC: Recent Labs  Lab 04/11/18 1607  WBC 10.6*  HGB 9.8*  HCT 33.7*  MCV 78.7  PLT 756   Basic Metabolic Panel: Recent Labs  Lab 04/11/18 1607  NA 138  K 4.4  CL 94*  CO2 35*  GLUCOSE 323*  BUN 17  CREATININE 0.99  CALCIUM 8.9   GFR: Estimated Creatinine Clearance: 54.6 mL/min (by C-G formula based on SCr of 0.99 mg/dL). Liver Function Tests: No results for input(s): AST, ALT, ALKPHOS, BILITOT, PROT, ALBUMIN in the last 168 hours. No results for input(s): LIPASE, AMYLASE in the last 168 hours. No results for input(s): AMMONIA in the last 168 hours. Coagulation Profile: No results for input(s): INR, PROTIME in the last 168 hours. Cardiac Enzymes: Recent Labs  Lab 04/11/18 1612  TROPONINI <0.03   BNP (last 3 results) No results for input(s): PROBNP in the last 8760 hours. HbA1C: No results for input(s): HGBA1C in the last 72 hours. CBG: No results for input(s): GLUCAP in the last 168 hours. Lipid Profile: No results for input(s): CHOL, HDL, LDLCALC, TRIG, CHOLHDL, LDLDIRECT in the last 72 hours. Thyroid Function Tests: No results for input(s): TSH, T4TOTAL, FREET4, T3FREE, THYROIDAB in the last 72 hours. Anemia Panel: No results for input(s): VITAMINB12, FOLATE, FERRITIN, TIBC, IRON, RETICCTPCT in the last 72 hours. Urine analysis:    Component Value Date/Time   COLORURINE AMBER (A) 05/05/2013 1621   APPEARANCEUR CLEAR 05/05/2013 1621   LABSPEC 1.025 05/05/2013 1621   PHURINE 5.5 05/05/2013 1621   GLUCOSEU NEGATIVE 05/05/2013 1621   HGBUR NEGATIVE 05/05/2013 1621   HGBUR negative 03/31/2009 1514   BILIRUBINUR SMALL (A) 05/05/2013 1621   KETONESUR NEGATIVE 05/05/2013 1621   PROTEINUR NEGATIVE 05/05/2013 1621  UROBILINOGEN 1.0 05/05/2013 1621   NITRITE NEGATIVE 05/05/2013 1621   LEUKOCYTESUR NEGATIVE 05/05/2013 1621   Sepsis  Labs: !!!!!!!!!!!!!!!!!!!!!!!!!!!!!!!!!!!!!!!!!!!! @LABRCNTIP (procalcitonin:4,lacticidven:4) )No results found for this or any previous visit (from the past 240 hour(s)).   Radiological Exams on Admission: Dg Chest Portable 1 View  Result Date: 04/11/2018 CLINICAL DATA:  Shortness of breath EXAM: PORTABLE CHEST 1 VIEW COMPARISON:  March 07, 2018 FINDINGS: The heart size and mediastinal contours are stable. The lungs are hyperinflated. There is central pulmonary vascular congestion unchanged. No frank pulmonary edema is noted. There is no focal pneumonia or pleural effusion. The visualized skeletal structures are unremarkable. IMPRESSION: COPD. Central pulmonary vascular congestion unchanged compared prior exam. Electronically Signed   By: Abelardo Diesel M.D.   On: 04/11/2018 16:11    Old chart reviewed Chest x-ray reviewed no significant edema or infiltrate Case discussed with Dr. Marye Round in the ED  Assessment/Plan 77 year old male with acute on chronic respiratory failure secondary to COPD exacerbation Principal Problem:   COPD exacerbation (HCC)-frequent nebs.  Supplemental oxygen.  IV Solu-Medrol.  Stop smoking.  Observe overnight on medical bed.  Active Problems:   Diabetes mellitus (HCC)-stable at this time    TOBACCO ABUSE-education prior to discharge    Essential hypertension-continue home meds    ATRIAL FIBRILLATION-currently rate controlled    GERD-stable    AAA (abdominal aortic aneurysm) (HCC)-stable     DVT prophylaxis: SCDs Code Status: Full Family Communication: None Disposition Plan: Likely tomorrow Consults called: None Admission status: Observation   Franshesca Chipman A MD [facility]  If 7PM-7AM, please contact night-coverage [email or web address] Password [email or web address]  04/11/2018, 7:30 PM

## 2018-04-12 DIAGNOSIS — Z9981 Dependence on supplemental oxygen: Secondary | ICD-10-CM | POA: Diagnosis not present

## 2018-04-12 DIAGNOSIS — F419 Anxiety disorder, unspecified: Secondary | ICD-10-CM | POA: Diagnosis not present

## 2018-04-12 DIAGNOSIS — I110 Hypertensive heart disease with heart failure: Secondary | ICD-10-CM | POA: Diagnosis not present

## 2018-04-12 DIAGNOSIS — K219 Gastro-esophageal reflux disease without esophagitis: Secondary | ICD-10-CM | POA: Diagnosis not present

## 2018-04-12 DIAGNOSIS — J9621 Acute and chronic respiratory failure with hypoxia: Secondary | ICD-10-CM

## 2018-04-12 DIAGNOSIS — Z794 Long term (current) use of insulin: Secondary | ICD-10-CM | POA: Diagnosis not present

## 2018-04-12 DIAGNOSIS — E785 Hyperlipidemia, unspecified: Secondary | ICD-10-CM | POA: Diagnosis not present

## 2018-04-12 DIAGNOSIS — Z825 Family history of asthma and other chronic lower respiratory diseases: Secondary | ICD-10-CM | POA: Diagnosis not present

## 2018-04-12 DIAGNOSIS — J441 Chronic obstructive pulmonary disease with (acute) exacerbation: Secondary | ICD-10-CM | POA: Diagnosis present

## 2018-04-12 DIAGNOSIS — I10 Essential (primary) hypertension: Secondary | ICD-10-CM

## 2018-04-12 DIAGNOSIS — Z7901 Long term (current) use of anticoagulants: Secondary | ICD-10-CM | POA: Diagnosis not present

## 2018-04-12 DIAGNOSIS — I5032 Chronic diastolic (congestive) heart failure: Secondary | ICD-10-CM | POA: Diagnosis not present

## 2018-04-12 DIAGNOSIS — Z79890 Hormone replacement therapy (postmenopausal): Secondary | ICD-10-CM | POA: Diagnosis not present

## 2018-04-12 DIAGNOSIS — I480 Paroxysmal atrial fibrillation: Secondary | ICD-10-CM

## 2018-04-12 DIAGNOSIS — G8929 Other chronic pain: Secondary | ICD-10-CM | POA: Diagnosis not present

## 2018-04-12 DIAGNOSIS — F17200 Nicotine dependence, unspecified, uncomplicated: Secondary | ICD-10-CM

## 2018-04-12 DIAGNOSIS — Z7952 Long term (current) use of systemic steroids: Secondary | ICD-10-CM | POA: Diagnosis not present

## 2018-04-12 DIAGNOSIS — M79652 Pain in left thigh: Secondary | ICD-10-CM | POA: Diagnosis not present

## 2018-04-12 DIAGNOSIS — I714 Abdominal aortic aneurysm, without rupture: Secondary | ICD-10-CM | POA: Diagnosis not present

## 2018-04-12 DIAGNOSIS — E1165 Type 2 diabetes mellitus with hyperglycemia: Secondary | ICD-10-CM | POA: Diagnosis not present

## 2018-04-12 DIAGNOSIS — Z7951 Long term (current) use of inhaled steroids: Secondary | ICD-10-CM | POA: Diagnosis not present

## 2018-04-12 DIAGNOSIS — Z8673 Personal history of transient ischemic attack (TIA), and cerebral infarction without residual deficits: Secondary | ICD-10-CM | POA: Diagnosis not present

## 2018-04-12 DIAGNOSIS — M545 Low back pain: Secondary | ICD-10-CM | POA: Diagnosis not present

## 2018-04-12 DIAGNOSIS — F17210 Nicotine dependence, cigarettes, uncomplicated: Secondary | ICD-10-CM | POA: Diagnosis not present

## 2018-04-12 DIAGNOSIS — Z8249 Family history of ischemic heart disease and other diseases of the circulatory system: Secondary | ICD-10-CM | POA: Diagnosis not present

## 2018-04-12 DIAGNOSIS — Z833 Family history of diabetes mellitus: Secondary | ICD-10-CM | POA: Diagnosis not present

## 2018-04-12 LAB — GLUCOSE, CAPILLARY: Glucose-Capillary: 380 mg/dL — ABNORMAL HIGH (ref 70–99)

## 2018-04-12 MED ORDER — INSULIN ASPART 100 UNIT/ML ~~LOC~~ SOLN
0.0000 [IU] | Freq: Three times a day (TID) | SUBCUTANEOUS | Status: DC
  Administered 2018-04-13: 8 [IU] via SUBCUTANEOUS
  Administered 2018-04-13: 11 [IU] via SUBCUTANEOUS
  Administered 2018-04-13: 3 [IU] via SUBCUTANEOUS
  Administered 2018-04-14: 8 [IU] via SUBCUTANEOUS
  Administered 2018-04-14: 11 [IU] via SUBCUTANEOUS

## 2018-04-12 MED ORDER — PANTOPRAZOLE SODIUM 40 MG PO TBEC
40.0000 mg | DELAYED_RELEASE_TABLET | Freq: Every day | ORAL | Status: DC
  Administered 2018-04-13 – 2018-04-14 (×2): 40 mg via ORAL
  Filled 2018-04-12 (×2): qty 1

## 2018-04-12 MED ORDER — NICOTINE 21 MG/24HR TD PT24
21.0000 mg | MEDICATED_PATCH | Freq: Every day | TRANSDERMAL | Status: DC
  Administered 2018-04-12: 21 mg via TRANSDERMAL
  Filled 2018-04-12 (×2): qty 1

## 2018-04-12 MED ORDER — MONTELUKAST SODIUM 10 MG PO TABS
10.0000 mg | ORAL_TABLET | Freq: Every day | ORAL | Status: DC
  Administered 2018-04-12 – 2018-04-13 (×2): 10 mg via ORAL
  Filled 2018-04-12 (×2): qty 1

## 2018-04-12 MED ORDER — DOXYCYCLINE HYCLATE 100 MG PO TABS
100.0000 mg | ORAL_TABLET | Freq: Two times a day (BID) | ORAL | Status: DC
  Administered 2018-04-12 – 2018-04-14 (×4): 100 mg via ORAL
  Filled 2018-04-12 (×4): qty 1

## 2018-04-12 MED ORDER — IPRATROPIUM-ALBUTEROL 0.5-2.5 (3) MG/3ML IN SOLN
3.0000 mL | RESPIRATORY_TRACT | Status: DC
  Administered 2018-04-12 – 2018-04-14 (×12): 3 mL via RESPIRATORY_TRACT
  Filled 2018-04-12 (×12): qty 3

## 2018-04-12 MED ORDER — INSULIN GLARGINE 100 UNIT/ML ~~LOC~~ SOLN
15.0000 [IU] | Freq: Every day | SUBCUTANEOUS | Status: DC
  Administered 2018-04-12 – 2018-04-13 (×2): 15 [IU] via SUBCUTANEOUS
  Filled 2018-04-12 (×4): qty 0.15

## 2018-04-12 MED ORDER — BUDESONIDE 0.5 MG/2ML IN SUSP
0.5000 mg | Freq: Two times a day (BID) | RESPIRATORY_TRACT | Status: DC
  Administered 2018-04-13 – 2018-04-14 (×3): 0.5 mg via RESPIRATORY_TRACT
  Filled 2018-04-12 (×3): qty 2

## 2018-04-12 MED ORDER — ROSUVASTATIN CALCIUM 20 MG PO TABS
20.0000 mg | ORAL_TABLET | Freq: Every day | ORAL | Status: DC
  Administered 2018-04-13: 20 mg via ORAL
  Filled 2018-04-12: qty 1

## 2018-04-12 MED ORDER — INSULIN ASPART 100 UNIT/ML ~~LOC~~ SOLN
0.0000 [IU] | Freq: Every day | SUBCUTANEOUS | Status: DC
  Administered 2018-04-12: 5 [IU] via SUBCUTANEOUS
  Administered 2018-04-13: 3 [IU] via SUBCUTANEOUS

## 2018-04-12 NOTE — Progress Notes (Signed)
Patient sleeping supine, no distress noted. Will continue to monitor.

## 2018-04-12 NOTE — Progress Notes (Signed)
Patient's dtr [facility]) called for update. Given. Satisfied all of her questions.

## 2018-04-12 NOTE — Progress Notes (Signed)
Inpatient Diabetes Program Recommendations  AACE/ADA: New Consensus Statement on Inpatient Glycemic Control (2015)  Target Ranges:  Prepandial:   less than 140 mg/dL      Peak postprandial:   less than 180 mg/dL (1-2 hours)      Critically ill patients:  140 - 180 mg/dL   Lab Results  Component Value Date   GLUCAP 291 (H) 03/11/2018   HGBA1C 8.3 (H) 02/04/2018    Review of Glycemic Control  Inpatient Diabetes Program Recommendations:   Glycemic control order set with Novolog sensitive correction while in the hospital.  Thank you, Nani Gasser. Damesha Lawler, RN, MSN, CDE  Diabetes Coordinator Inpatient Glycemic Control Team Team Pager [phone] (8am-5pm) 04/12/2018 11:13 AM

## 2018-04-12 NOTE — Progress Notes (Signed)
PROGRESS NOTE    Andrew [name]  [id number] DOB: 1941/05/07 DOA: 04/11/2018 [facility]: [facility]     Brief Narrative:  77 y/o with hx of tobacco abuse, HTN, DM type 2 and COPD (with chronic resp failure). Presented to ED with acute on chronic resp failure with hypoxia due to COPD exacerbation.   Assessment & Plan: 1-acute on chronic resp failure with hypoxia: in the setting of COPD exacerbation. -still with SOB and difficulty speaking in full sentences -will continue oxygen supplementation -continue steroids -will start pulmicort, flutter valve and doxycycline -continue duoneb -follow clinical response  2-tobacco abuse -I have discussed tobacco cessation with the patient.  I have counseled the patient regarding the negative impacts of continued tobacco use including but not limited to lung cancer, COPD, and cardiovascular disease.  I have discussed alternatives to tobacco and modalities that may help facilitate tobacco cessation including but not limited to biofeedback, hypnosis, and medications.  Total time spent with tobacco counseling was 5 minutes -nicotine patch ordered  3-Diabetes mellitus ([place]) -will start SSI and resume lantus  -will follow CBG's and adjust hypoglycemic regimen as needed -anticipating elevation on his CBG's due to steroids.  4-Essential hypertension -stable -will continue current antihypertensive regimen and follow VS  5-ATRIAL FIBRILLATION -stable and rate controlled -will continue eliquis for secondary prevention  6-GERD -continue PPI  7-anxiety -continue PRN xanax. -no hallucinations   DVT prophylaxis: chronically on eliquis. Code Status: Full Family Communication: no family at bedside  Disposition Plan: remains in the hospital, start pulmicort, flutter valve and doxycycline; continue oxygen supplementation and continue steroids.   Consultants:   None   Procedures:   See below for x-ray reports.  Antimicrobials:    Anti-infectives (From admission, onward)   Start     Dose/Rate Route Frequency Ordered Stop   04/12/18 2300  doxycycline (VIBRA-TABS) tablet 100 mg     100 mg Oral Every 12 hours 04/12/18 2249        Subjective: Complaining of SOB, mildly using accessory muscles and unable to speak in full sentences. Ongoing wheezing reported.  Objective: Vitals:   04/12/18 1450 04/12/18 1815 04/12/18 2013 04/12/18 2127  BP:    (!) 118/47  Pulse:    69  Resp:      Temp:    98.1 F (36.7 C)  TempSrc:    Oral  SpO2: 91% 95% (!) 88% 97%  Weight:      Height:        Intake/Output Summary (Last 24 hours) at 04/12/2018 2254 Last data filed at 04/12/2018 2128 Gross per 24 hour  Intake 720 ml  Output 2550 ml  Net -1830 ml   Filed Weights   04/11/18 1842 04/12/18 0512  Weight: 74 kg 73.6 kg    Examination: General exam: Alert, awake, oriented x 3; unable to speak in full sentences; complaining of SOB and mild intercostal retractions. Respiratory system: decrease air movement, mildly using accessory muscles, with positive exp wheezing and positive rhonchi. Cardiovascular system:regular rate, no rubs, no gallops and no JVD on exam. Gastrointestinal system: Abdomen is nondistended, soft and nontender. No organomegaly or masses felt. Normal bowel sounds heard. Central nervous system: Alert and oriented. No focal neurological deficits. Extremities: No C/C/E, +pedal pulses Skin: No rashes, lesions or ulcers Psychiatry: Judgement and insight appear normal. Mood & affect appropriate.     Data Reviewed: I have personally reviewed following labs and imaging studies  CBC: Recent Labs  Lab 04/11/18 1607  WBC 10.6*  HGB 9.8*  HCT 33.7*  MCV 78.7  PLT 846   Basic Metabolic Panel: Recent Labs  Lab 04/11/18 1607  NA 138  K 4.4  CL 94*  CO2 35*  GLUCOSE 323*  BUN 17  CREATININE 0.99  CALCIUM 8.9   GFR: Estimated Creatinine Clearance: 54.6 mL/min (by C-G formula based on SCr of 0.99  mg/dL).  Cardiac Enzymes: Recent Labs  Lab 04/11/18 1612  TROPONINI <0.03   Urine analysis:    Component Value Date/Time   COLORURINE AMBER (A) 05/05/2013 1621   APPEARANCEUR CLEAR 05/05/2013 1621   LABSPEC 1.025 05/05/2013 1621   PHURINE 5.5 05/05/2013 1621   GLUCOSEU NEGATIVE 05/05/2013 1621   HGBUR NEGATIVE 05/05/2013 1621   HGBUR negative 03/31/2009 1514   BILIRUBINUR SMALL (A) 05/05/2013 1621   KETONESUR NEGATIVE 05/05/2013 1621   PROTEINUR NEGATIVE 05/05/2013 1621   UROBILINOGEN 1.0 05/05/2013 1621   NITRITE NEGATIVE 05/05/2013 1621   LEUKOCYTESUR NEGATIVE 05/05/2013 1621   Radiology Studies: Dg Chest Portable 1 View  Result Date: 04/11/2018 CLINICAL DATA:  Shortness of breath EXAM: PORTABLE CHEST 1 VIEW COMPARISON:  March 07, 2018 FINDINGS: The heart size and mediastinal contours are stable. The lungs are hyperinflated. There is central pulmonary vascular congestion unchanged. No frank pulmonary edema is noted. There is no focal pneumonia or pleural effusion. The visualized skeletal structures are unremarkable. IMPRESSION: COPD. Central pulmonary vascular congestion unchanged compared prior exam. Electronically Signed   By: Abelardo Diesel M.D.   On: 04/11/2018 16:11   Scheduled Meds: . apixaban  5 mg Oral BID  . budesonide (PULMICORT) nebulizer solution  0.5 mg Nebulization BID  . diltiazem  300 mg Oral Daily  . doxycycline  100 mg Oral Q12H  . [START ON 04/13/2018] insulin aspart  0-15 Units Subcutaneous TID WC  . insulin aspart  0-5 Units Subcutaneous QHS  . insulin glargine  15 Units Subcutaneous QHS  . ipratropium-albuterol  3 mL Nebulization Q4H  . methylPREDNISolone (SOLU-MEDROL) injection  80 mg Intravenous Q12H  . montelukast  10 mg Oral QHS  . [START ON 04/13/2018] nicotine  21 mg Transdermal Daily  . [START ON 04/13/2018] pantoprazole  40 mg Oral Daily  . [START ON 04/13/2018] rosuvastatin  20 mg Oral q1800  . sodium chloride flush  3 mL Intravenous Q12H    Continuous Infusions: . sodium chloride       LOS: 0 days    Time spent: 40 minutes. Greater than 50% of this time was spent in direct contact with the patient, coordinating care and discussing relevant ongoing clinical issues, including his COPD status, the need for steroids, nebulizer treatment and ongoing oxygen supplementation. Patient also received over 5 minutes of smoking cessation counseling.     Barton Dubois, MD [facility] Pager [phone]  If 7PM-7AM, please contact night-coverage [email or web address] Password [facility] 04/12/2018, 10:54 PM

## 2018-04-13 DIAGNOSIS — K219 Gastro-esophageal reflux disease without esophagitis: Secondary | ICD-10-CM | POA: Diagnosis not present

## 2018-04-13 DIAGNOSIS — I480 Paroxysmal atrial fibrillation: Secondary | ICD-10-CM | POA: Diagnosis not present

## 2018-04-13 DIAGNOSIS — E1165 Type 2 diabetes mellitus with hyperglycemia: Secondary | ICD-10-CM | POA: Diagnosis not present

## 2018-04-13 DIAGNOSIS — G894 Chronic pain syndrome: Secondary | ICD-10-CM

## 2018-04-13 DIAGNOSIS — J441 Chronic obstructive pulmonary disease with (acute) exacerbation: Secondary | ICD-10-CM | POA: Diagnosis not present

## 2018-04-13 LAB — GLUCOSE, CAPILLARY
Glucose-Capillary: 296 mg/dL — ABNORMAL HIGH (ref 70–99)
Glucose-Capillary: 348 mg/dL — ABNORMAL HIGH (ref 70–99)
Glucose-Capillary: 167 mg/dL — ABNORMAL HIGH (ref 70–99)
Glucose-Capillary: 265 mg/dL — ABNORMAL HIGH (ref 70–99)

## 2018-04-13 MED ORDER — MORPHINE SULFATE (CONCENTRATE) 10 MG/0.5ML PO SOLN
5.0000 mg | ORAL | Status: DC | PRN
  Administered 2018-04-14 (×2): 5 mg via ORAL
  Filled 2018-04-13 (×2): qty 0.5

## 2018-04-13 NOTE — Care Management Important Message (Signed)
Important Message  Patient Details  Name: DRAVEN NATTER MRN: [id number] Date of Birth: [birth year]   Medicare Important Message Given:  Yes Tried to sign just too weak.   Holli Humbles Smith 04/13/2018, 12:31 PM

## 2018-04-13 NOTE — Progress Notes (Signed)
PROGRESS NOTE    Andrew [name]  OZD:[id number] DOB: Aug 03, 1941 DOA: 04/11/2018 PCP: [facility]     Brief Narrative:  77 y/o with hx of tobacco abuse, HTN, DM type 2 and COPD (with chronic resp failure). Presented to ED with acute on chronic resp failure with hypoxia due to COPD exacerbation.   Assessment & Plan: 1-acute on chronic resp failure with hypoxia: in the setting of COPD exacerbation. -still with SOB and some difficulty speaking in full sentences -will continue oxygen supplementation -continue steroids -will continue pulmicort, flutter valve and doxycycline -continue duoneb -follow clinical response -patient chronically on steroids; will add low dose morphine for air hunger feeling.  2-tobacco abuse -extensive discussion about smoking cessation provided on 8/8 -nicotine patch ordered -patient not ready to quit.  3-Diabetes mellitus ([place]) -will start SSI and resume lantus  -will follow CBG's and adjust hypoglycemic regimen as needed -anticipating elevation on his CBG's due to steroids.  4-Essential hypertension -stable -will continue current antihypertensive regimen and follow VS  5-ATRIAL FIBRILLATION -stable and rate controlled -will continue eliquis for secondary prevention  6-GERD -continue PPI  7-anxiety -continue PRN xanax. -no hallucinations  8-chronic pain in his lower back and left leg -will use low dose liquid morphine for pain. -medication will also assist with air hunger.  DVT prophylaxis: chronically on eliquis. Code Status: Full Family Communication: no family at bedside  Disposition Plan: remains in the hospital, start pulmicort, flutter valve and doxycycline; continue oxygen supplementation and continue steroids.   Consultants:   None   Procedures:   See below for x-ray reports.  Antimicrobials:  Anti-infectives (From admission, onward)   Start     Dose/Rate Route Frequency Ordered Stop   04/12/18 2300  doxycycline  (VIBRA-TABS) tablet 100 mg     100 mg Oral Every 12 hours 04/12/18 2249        Subjective: Complaining of SOB and pain in his lower back and left leg. No CP, breathing somewhat improved. Still SOB and with diffuse wheezing. Even still unable to speak in full sentences, he is making progress.  Objective: Vitals:   04/13/18 1615 04/13/18 1938 04/13/18 1941 04/13/18 2131  BP:    127/61  Pulse:    85  Resp:    20  Temp:    (!) 97.5 F (36.4 C)  TempSrc:    Oral  SpO2: 96% 97% 96% 95%  Weight:      Height:        Intake/Output Summary (Last 24 hours) at 04/13/2018 2236 Last data filed at 04/13/2018 1858 Gross per 24 hour  Intake 1080 ml  Output 1950 ml  Net -870 ml   Filed Weights   04/11/18 1842 04/12/18 0512  Weight: 74 kg 73.6 kg    Examination: General exam: Alert, awake, oriented x 3; reports lower back pain and left leg pain (which are chronic).  Expressed still feeling SOB, even improved. Having less difficulty completing sentences. Still using accessory muscles.  Respiratory system: diffuse exp wheezing, improved ir movement, positive rhonchi; mildly using accessory muscles, positive tachypnea. Cardiovascular system: rate controlled., no rubs, no gallops, no JVD.   Gastrointestinal system: Abdomen is nondistended, soft and nontender. No organomegaly or masses felt. Normal bowel sounds heard. Central nervous system: Alert and oriented. No focal neurological deficits. Extremities: No C/C/E, +pedal pulses Skin: No rashes, lesions or ulcers Psychiatry: Judgement and insight appear normal. Mood & affect appropriate.    Data Reviewed: I have personally reviewed following labs and  imaging studies  CBC: Recent Labs  Lab 04/11/18 1607  WBC 10.6*  HGB 9.8*  HCT 33.7*  MCV 78.7  PLT 219   Basic Metabolic Panel: Recent Labs  Lab 04/11/18 1607  NA 138  K 4.4  CL 94*  CO2 35*  GLUCOSE 323*  BUN 17  CREATININE 0.99  CALCIUM 8.9   GFR: Estimated Creatinine  Clearance: 54.6 mL/min (by C-G formula based on SCr of 0.99 mg/dL).  Cardiac Enzymes: Recent Labs  Lab 04/11/18 1612  TROPONINI <0.03   Urine analysis:    Component Value Date/Time   COLORURINE AMBER (A) 05/05/2013 1621   APPEARANCEUR CLEAR 05/05/2013 1621   LABSPEC 1.025 05/05/2013 1621   PHURINE 5.5 05/05/2013 1621   GLUCOSEU NEGATIVE 05/05/2013 1621   HGBUR NEGATIVE 05/05/2013 1621   HGBUR negative 03/31/2009 1514   BILIRUBINUR SMALL (A) 05/05/2013 1621   KETONESUR NEGATIVE 05/05/2013 1621   PROTEINUR NEGATIVE 05/05/2013 1621   UROBILINOGEN 1.0 05/05/2013 1621   NITRITE NEGATIVE 05/05/2013 1621   LEUKOCYTESUR NEGATIVE 05/05/2013 1621   Scheduled Meds: . apixaban  5 mg Oral BID  . budesonide (PULMICORT) nebulizer solution  0.5 mg Nebulization BID  . diltiazem  300 mg Oral Daily  . doxycycline  100 mg Oral Q12H  . insulin aspart  0-15 Units Subcutaneous TID WC  . insulin aspart  0-5 Units Subcutaneous QHS  . insulin glargine  15 Units Subcutaneous QHS  . ipratropium-albuterol  3 mL Nebulization Q4H  . methylPREDNISolone (SOLU-MEDROL) injection  80 mg Intravenous Q12H  . montelukast  10 mg Oral QHS  . nicotine  21 mg Transdermal Daily  . pantoprazole  40 mg Oral Daily  . rosuvastatin  20 mg Oral q1800  . sodium chloride flush  3 mL Intravenous Q12H   Continuous Infusions: . sodium chloride       LOS: 1 day    Time spent: 30 minutes.    Barton Dubois, MD [facility] Pager [phone]  If 7PM-7AM, please contact night-coverage [email or web address] Password [facility] 04/13/2018, 10:36 PM

## 2018-04-13 NOTE — Care Management (Signed)
Pt from home, ind with ADL's. Has home oxygen, continues to smoke. Does not wish to stop smoking. CM contacted by [facility], [facility], who says they recently discharged pt d/t noncompliance. They will not accept pt back.

## 2018-04-14 DIAGNOSIS — J441 Chronic obstructive pulmonary disease with (acute) exacerbation: Secondary | ICD-10-CM | POA: Diagnosis not present

## 2018-04-14 DIAGNOSIS — I480 Paroxysmal atrial fibrillation: Secondary | ICD-10-CM | POA: Diagnosis not present

## 2018-04-14 DIAGNOSIS — E1165 Type 2 diabetes mellitus with hyperglycemia: Secondary | ICD-10-CM | POA: Diagnosis not present

## 2018-04-14 DIAGNOSIS — K219 Gastro-esophageal reflux disease without esophagitis: Secondary | ICD-10-CM | POA: Diagnosis not present

## 2018-04-14 LAB — GLUCOSE, CAPILLARY
GLUCOSE-CAPILLARY: 280 mg/dL — AB (ref 70–99)
GLUCOSE-CAPILLARY: 310 mg/dL — AB (ref 70–99)
GLUCOSE-CAPILLARY: 235 mg/dL — AB (ref 70–99)
Glucose-Capillary: 248 mg/dL — ABNORMAL HIGH (ref 70–99)

## 2018-04-14 MED ORDER — PREDNISONE 10 MG PO TABS: ORAL_TABLET | ORAL | 0 refills | Status: DC

## 2018-04-14 MED ORDER — PANTOPRAZOLE SODIUM 40 MG PO TBEC: 40.0000 mg | DELAYED_RELEASE_TABLET | Freq: Every day | ORAL | 1 refills | Status: AC

## 2018-04-14 MED ORDER — NICOTINE 21 MG/24HR TD PT24: 21.0000 mg | MEDICATED_PATCH | Freq: Every day | TRANSDERMAL | 1 refills | Status: AC

## 2018-04-14 MED ORDER — DOXYCYCLINE HYCLATE 100 MG PO TABS: 100.0000 mg | ORAL_TABLET | Freq: Two times a day (BID) | ORAL | 0 refills | Status: AC

## 2018-04-14 NOTE — Discharge Summary (Signed)
Physician Discharge Summary  Andrew [name] YYT:[id number] DOB: [birth year] DOA: 04/11/2018  PCP: [facility]  Admit date: 04/11/2018 Discharge date: 04/14/2018  Time spent: 35 minutes  Recommendations for Outpatient Follow-up:  Follow symptoms management and transition gear to hospice care. Adjust medication regimen to reflect symptom management and avoid future re-hospitalization if possible.   Discharge Diagnoses:  Acute on chronic resp failure with hypoxia End Stage COPD exacerbation ([place]) Diabetes mellitus with hyperglycemi and long term use of insuline (HCC) Continuous TOBACCO ABUSE Essential hypertension Paroxysmal ATRIAL FIBRILLATION GERD Hx of AAA (abdominal aortic aneurysm) (HCC) Physical deconditioning   Discharge Condition: Stable and improved.  Discharge home with instructions to follow-up with PCP in 10 days.  Also home health services for PT and arrangement for home hospice has been requested.  Diet recommendation: Heart healthy and modified carbohydrates diet.  Filed Weights   04/11/18 1842 04/12/18 0512 04/14/18 0500  Weight: 74 kg 73.6 kg 73.7 kg    History of present illness:  As per H&P written by Dr. Shanon Brow on 04/11/2018. 77 y.o. male with medical history significant of COPD, chronic respiratory failure, A. fib, hypertension, CHF brought in by EMS for wheezing and shortness of breath.  EMS arrived to his house and he was smoking on his front porch wheezing without his oxygen.  He is not compliant with his home oxygen.  But at least is not smoking with his oxygen.  He denies any fevers.  He says he has been wheezing a whole lot.  He denies any swelling in his legs.  Patient referred for admission for COPD exacerbation.  Hospital Course:  1-acute on chronic resp failure with hypoxia: in the setting of end stage COPD exacerbation. -still with SOB on exertion, but much improved -able to speak in almost complete sentences and with good O2 sat on 3L. -will  continue oxygen supplementation -discharge on steroids tapering and instructed to be compliant with  flutter valve, home inhaler/nebulizer regimen and doxycycline -patient chronically on steroids; will continue also low dose morphine for air hunger feeling. -this is end stage COPD and he would benefit of home hospice care.   2-tobacco abuse -extensive discussion about smoking cessation provided on 8/8 -nicotine patch ordered -patient expressed not been ready to quit.  3-Diabetes mellitus ([place]): with long term use of insulin and hyperglycemia -will continue home SSI and Lantus regimen -anticipating elevation on his CBG's due to steroids. -Further adjustment to his hypoglycemic regimen to be done by PCP.  4-Essential hypertension -stable -will continue current antihypertensive regimen -advise to follow heart healthy diet   5-Paroxysmal Atrial Fibrillaiton -stable and rate controlled -will continue eliquis for secondary prevention -continue cardizem -CHADSVASC score 3-4  6-GERD -continue PPI  7-anxiety -no hallucinations -stable mood -outpatient follow up with PCP; if needed will benefit of anxiolytic regimen   8-chronic pain in his lower back and left leg -will continue using low dose liquid morphine for pain. -continue also PRN muscle relaxants.   9-physical deconditioning: -Home health PT has been ordered following physical therapy evaluation and recommendations.  Procedures:  See below for x-ray reports.  Consultations:  None  Discharge Exam: Vitals:   04/14/18 1400 04/14/18 1612  BP: (!) 145/62   Pulse: 72   Resp: 20   Temp: 97.6 F (36.4 C)   SpO2: 100% 97%   General exam: Alert, awake, oriented x 3; reports lower back pain and left leg pain (which are chronic).  Expressed still feeling SOB with exertion.  Able  to speak almost in complete sentences and with 100% oxygen saturation on 3 L supplementation.   Respiratory system:  improvement on his  exp wheezing; no using accessory muscles. Cardiovascular system: rate controlled., no rubs, no gallops, no JVD.   Gastrointestinal system: Abdomen is nondistended, soft and nontender. No organomegaly or masses felt. Normal bowel sounds heard. Central nervous system: Alert and oriented. No focal neurological deficits. Extremities: No C/C/E, +pedal pulses Skin: No rashes, lesions or ulcers Psychiatry: Judgement and insight appear normal. Mood & affect appropriate.     Discharge Instructions   Discharge Instructions    Diet - low sodium heart healthy   Complete by:  As directed    Discharge instructions   Complete by:  As directed    Take medications as prescribed Stop smoking Use nebulizer and inhalers regimen as instructed. Make sure to be compliant with your oxygen supplementation at all times. Arrange follow-up with PCP in 10 days.     Allergies as of 04/14/2018      Reactions   Procaine Shortness Of Breath, Other (See Comments)   Syncope.    Procaine Hcl Anaphylaxis, Swelling      Medication List    STOP taking these medications   diphenhydrAMINE 25 MG tablet Commonly known as:  BENADRYL   Fluticasone-Salmeterol 500-50 MCG/DOSE Aepb Commonly known as:  ADVAIR   omeprazole 40 MG capsule Commonly known as:  PRILOSEC     TAKE these medications   acetaminophen 650 MG CR tablet Commonly known as:  TYLENOL Take 650 mg by mouth every 8 (eight) hours as needed for pain.   albuterol (2.5 MG/3ML) 0.083% nebulizer solution Commonly known as:  PROVENTIL Take 3 mLs (2.5 mg total) by nebulization every 4 (four) hours as needed for wheezing or shortness of breath.   apixaban 5 MG Tabs tablet Commonly known as:  ELIQUIS Take 1 tablet (5 mg total) by mouth 2 (two) times daily. Restart after 5days on 6/9   aspirin EC 325 MG tablet Take 325 mg by mouth daily.   budesonide-formoterol 160-4.5 MCG/ACT inhaler Commonly known as:  SYMBICORT Inhale 2 puffs into the lungs 2  (two) times daily.   diltiazem 300 MG 24 hr capsule Commonly known as:  TIAZAC Take 1 capsule (300 mg total) by mouth daily.   docusate sodium 100 MG capsule Commonly known as:  COLACE Take 100 mg by mouth daily.   doxycycline 100 MG tablet Commonly known as:  VIBRA-TABS Take 1 tablet (100 mg total) by mouth every 12 (twelve) hours for 10 doses.   furosemide 40 MG tablet Commonly known as:  LASIX Take 1 tablet (40 mg total) by mouth daily.   insulin aspart 100 UNIT/ML FlexPen Commonly known as:  NOVOLOG Check CBG's there etimes daily before meals. For CBG less than 150: 0 Units For CBG 150-200: 2 Units SQ For CBG 200-250: 4 Units SQ For CBG 250-300: 6 Units SQ For CBG 300-350: 8 Units SQ and call PCP for further instructions What changed:    how much to take  how to take this  when to take this   insulin glargine 100 unit/mL Sopn Commonly known as:  LANTUS Inject 0.25 mLs (25 Units total) into the skin daily.   Melatonin 3 MG Tabs Take 1 tablet by mouth at bedtime.   metoCLOPramide 10 MG tablet Commonly known as:  REGLAN Take 10 mg by mouth 4 (four) times daily.   montelukast 10 MG tablet Commonly known as:  SINGULAIR Take  10 mg by mouth at bedtime.   morphine CONCENTRATE 10 mg / 0.5 ml concentrated solution Take 0.25 mLs by mouth every 3 (three) hours as needed for shortness of breath.   nicotine 21 mg/24hr patch Commonly known as:  NICODERM CQ - dosed in mg/24 hours Place 1 patch (21 mg total) onto the skin daily. Start taking on:  04/15/2018   pantoprazole 40 MG tablet Commonly known as:  PROTONIX Take 1 tablet (40 mg total) by mouth daily. Start taking on:  04/15/2018   potassium chloride SA 20 MEQ tablet Commonly known as:  K-DUR,KLOR-CON Take 1 tablet (20 mEq total) by mouth daily.   predniSONE 10 MG tablet Commonly known as:  DELTASONE Take 6 tablets by mouth daily x1 day; then 5 tablets by mouth daily x2-day; then 3 tablets by mouth daily x3  days; then start taking 2 tablets by mouth on daily basis. What changed:    how much to take  how to take this  when to take this  additional instructions   rosuvastatin 20 MG tablet Commonly known as:  CRESTOR Take 1 tablet (20 mg total) by mouth daily.   tiotropium 18 MCG inhalation capsule Commonly known as:  SPIRIVA Place 1 capsule (18 mcg total) into inhaler and inhale daily.   tiZANidine 4 MG tablet Commonly known as:  ZANAFLEX Take 4 mg by mouth 3 (three) times daily.   vitamin B-12 500 MCG tablet Commonly known as:  CYANOCOBALAMIN Take 1,000 mcg by mouth daily.      Allergies  Allergen Reactions  . Procaine Shortness Of Breath and Other (See Comments)    Syncope.   . Procaine Hcl Anaphylaxis and Swelling   Follow-up Information    [facility] Schedule an appointment as soon as possible for a visit in 10 day(s).   Contact information: [place] [address] [phone]           The results of significant diagnostics from this hospitalization (including imaging, microbiology, ancillary and laboratory) are listed below for reference.    Significant Diagnostic Studies: Dg Chest Portable 1 View  Result Date: 04/11/2018 CLINICAL DATA:  Shortness of breath EXAM: PORTABLE CHEST 1 VIEW COMPARISON:  March 07, 2018 FINDINGS: The heart size and mediastinal contours are stable. The lungs are hyperinflated. There is central pulmonary vascular congestion unchanged. No frank pulmonary edema is noted. There is no focal pneumonia or pleural effusion. The visualized skeletal structures are unremarkable. IMPRESSION: COPD. Central pulmonary vascular congestion unchanged compared prior exam. Electronically Signed   By: Abelardo Diesel M.D.   On: 04/11/2018 16:11   Labs: Basic Metabolic Panel: Recent Labs  Lab 04/11/18 1607  NA 138  K 4.4  CL 94*  CO2 35*  GLUCOSE 323*  BUN 17  CREATININE 0.99  CALCIUM 8.9   CBC: Recent  Labs  Lab 04/11/18 1607  WBC 10.6*  HGB 9.8*  HCT 33.7*  MCV 78.7  PLT 252   Cardiac Enzymes: Recent Labs  Lab 04/11/18 1612  TROPONINI <0.03   BNP: BNP (last 3 results) Recent Labs    12/26/17 1335 03/07/18 2326 04/11/18 1607  BNP 119.0* 119.0* 82.0   CBG: Recent Labs  Lab 04/13/18 2133 04/14/18 0735 04/14/18 1134 04/14/18 1249 04/14/18 1617  GLUCAP 265* 280* 310* 235* 248*    Signed:  Barton Dubois MD.  [facility] 04/14/2018, 4:57 PM

## 2018-04-14 NOTE — Evaluation (Signed)
Physical Therapy Evaluation Patient Details Name: Andrew [name] MRN: [id number] DOB: [birth year] Today's Date: 04/14/2018   History of Present Illness  Andrew [name] is a 77 y.o. male with medical history significant of COPD, chronic respiratory failure, A. fib, hypertension, CHF brought in by EMS for wheezing and shortness of breath.  EMS arrived to his house and he was smoking on his front porch wheezing without his oxygen.  He is not compliant with his home oxygen.  But at least is not smoking with his oxygen.  He denies any fevers.  He says he has been wheezing a whole lot.  He denies any swelling in his legs.  Patient referred for admission for COPD exacerbation.    Clinical Impression  Andrew [name] is a 77 y.o. presenting for PT evaluation with recent decrease in functional mobility secondary to COPD exacerbation. He is currently functioning close to his baseline requiring assistance for ADL's sucha s bathing and dressing, and assistance for limited household mobility with his rollator. He is limited with mobility by SOB and was unable to ambulate due to fatigue. He has weakness throughout bil LE's and would benefit from skilled HHPT services to improve strength, activity tolerance, and mobility. He lives with his daughter who provides assistance for ADL's and has a ramped entrance to his home and lives on the first/ground floor. He will benefit from skilled PT to address current impairments and from follow up PT at below venue to address mobility. Acute PT will follow.     Follow Up Recommendations Home health PT    Equipment Recommendations  None recommended by PT    Recommendations for Other Services       Precautions / Restrictions Precautions Precautions: Fall Restrictions Weight Bearing Restrictions: No Other Position/Activity Restrictions: O2 supplement, 3L/min at home      Mobility  Bed Mobility Overal bed mobility: Needs Assistance        General bed mobility comments:  patient has hospital bed at home and requires elevated HOB to perform supine to sit transfers  Transfers Overall transfer level: Needs assistance Equipment used: Rolling walker (2 wheeled) Transfers: Sit to/from Stand Sit to Stand: Min assist      General transfer comment: patient requires assistance to initiate transfer and cues for hand placement, performs 2 sit to stand before feeling too fatigued  Ambulation/Gait        General Gait Details: deferred for safety due to fatigue and SOB limiting patient      Balance Overall balance assessment: Needs assistance   Sitting balance-Leahy Scale: Poor Sitting balance - Comments: patient unable to perform Bil LE  exercsies without loosing balance posteriorly.  Postural control: Posterior lean   Standing balance-Leahy Scale: Poor Standing balance comment: patieint heavily reliant on bil UE for support to maintain standing balance, performed marching with low foot clearance, limited by fatigue/SOB          Pertinent Vitals/Pain Pain Assessment: 0-10 Pain Score: 5  Pain Location: left low back and hip Pain Descriptors / Indicators: Aching Pain Intervention(s): Limited activity within patient's tolerance;Monitored during session    Home Living Family/patient expects to be discharged to:: Private residence Living Arrangements: Children(daughter) Available Help at Discharge: Family;Available 24 hours/day Type of Home: House Home Access: Ramped entrance     Home Layout: Multi-level Home Equipment: Walker - 4 wheels;Wheelchair - Education officer, community - power;Shower seat;Hospital bed;Cane - single point Additional Comments: patient would benefit from shower bench    Prior Function Level of  Independence: Needs assistance   Gait / Transfers Assistance Needed: Amb short household distances with rollator. Does not drive  ADL's / Homemaking Assistance Needed: pt reports his daughter is helping him with bathing/dressing  Comments:  patient does not drive 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, uses a rollator for mobility in his home primarily     Hand Dominance   Dominant Hand: Right    Extremity/Trunk Assessment   Upper Extremity Assessment Upper Extremity Assessment: Generalized weakness    Lower Extremity Assessment Lower Extremity Assessment: Generalized weakness    Cervical / Trunk Assessment Cervical / Trunk Assessment: Kyphotic  Communication   Communication: HOH  Cognition Arousal/Alertness: Awake/alert Behavior During Therapy: WFL for tasks assessed/performed Overall Cognitive Status: Within Functional Limits for tasks assessed        General Comments: patient SOB with talking         Exercises General Exercises - Lower Extremity Long Arc Quad: AROM;Right;Left;10 reps Hip Flexion/Marching: 10 reps;Left;Right;AROM Toe Raises: AROM;Left;Right;10 reps   Assessment/Plan    PT Assessment Patient needs continued PT services  PT Problem List Decreased strength;Decreased balance;Decreased mobility;Cardiopulmonary status limiting activity;Decreased activity tolerance       PT Treatment Interventions Balance training;Gait training;Stair training;Patient/family education;Functional mobility training;Therapeutic activities;Therapeutic exercise    PT Goals (Current goals can be found in the Care Plan section)  Acute Rehab PT Goals Patient Stated Goal: to return home, and work with home health therapist to get stronger PT Goal Formulation: With patient Time For Goal Achievement: 04/20/18 Potential to Achieve Goals: Fair    Frequency Min 3X/week    AM-PAC PT "6 Clicks" Daily Activity  Outcome Measure Difficulty turning over in bed (including adjusting bedclothes, sheets and blankets)?: Unable Difficulty moving from lying on back to sitting on the side of the bed? : Unable Difficulty sitting down on and standing up from a chair with arms (e.g., wheelchair, bedside commode, etc,.)?: Unable Help  needed moving to and from a bed to chair (including a wheelchair)?: A Little Help needed walking in hospital room?: A Lot Help needed climbing 3-5 steps with a railing? : Total 6 Click Score: 9    End of Session Equipment Utilized During Treatment: Gait belt Activity Tolerance: Patient limited by fatigue Patient left: in bed;with call bell/phone within reach;with bed alarm set Nurse Communication: Mobility status PT Visit Diagnosis: Unsteadiness on feet (R26.81);Other abnormalities of gait and mobility (R26.89);Muscle weakness (generalized) (M62.81);Difficulty in walking, not elsewhere classified (R26.2)    Time: 0347-4259 PT Time Calculation (min) (ACUTE ONLY): 20 min   Charges:   PT Evaluation $PT Eval Moderate Complexity: 1 Mod          Kipp Brood, PT, DPT Physical Therapist with [facility]  04/14/2018 1:58 PM

## 2018-04-14 NOTE — Care Management Note (Signed)
Case Management Note  Patient Details  Name: Andrew [name] MRN: [id number] Date of Birth: [birth year]  Subjective/Objective:        MD requested home hospice services after speaking with family and arranging d/c.  Pt's daughter, Rip Harbour, reports pt has oxygen and hospital bed through the [place]. Rip Harbour feels that she can handle the patient's care until Hospice can admit them on Monday.  The pt has been at home under her care until admission 3 days ago.  He had [facility] services until then.          Action/Plan: Offered home hospice agency choices to [place].  Melinda's main concern is having staff close by and therefore wants the closest agency.  Advised that nurses still cover an area that may be wide, but [facility]'s home office is in [place].  Rip Harbour states she would like to speak with them.  Referral called to [facility], RN returned call and will call [facility] tomorrow.  Likely admission on Monday.  Face sheet, notes, orders faxed to [facility].   Expected Discharge Date:  04/14/18               Expected Discharge Plan:  Home w Hospice Care  In-House Referral:  NA  Discharge planning Services  CM Consult  Post Acute Care Choice:  Hospice Choice offered to:  Adult Children  DME Arranged:    DME Agency:     HH Arranged:    HH Agency:  [facility]  Status of Service:  Completed, signed off  If discussed at [place] of Stay Meetings, dates discussed:    Additional Comments:  Claudie Leach, RN 04/14/2018, 5:20 PM

## 2018-04-14 NOTE — Progress Notes (Signed)
Pt discharged home today per Dr Madera. Pt's IV site D/C'd and WDL. Pt's VSS. Pt provided with home medication list, discharge instructions and prescriptions. Verbalized understanding. Pt left floor via WC in stable condition accompanied by NT. 

## 2018-04-14 NOTE — Plan of Care (Signed)
  Problem: Acute Rehab PT Goals(only PT should resolve) Goal: Patient Will Transfer Sit To/From Stand Outcome: Progressing Flowsheets (Taken 04/14/2018 1358) Patient will transfer sit to/from stand: with modified independence Goal: Pt Will Transfer Bed To Chair/Chair To Bed Outcome: Progressing Flowsheets (Taken 04/14/2018 1358) Pt will Transfer Bed to Chair/Chair to Bed: with supervision Goal: Pt Will Ambulate Outcome: Progressing Flowsheets (Taken 04/14/2018 1358) Pt will Ambulate: 10 feet; with minimal assist; with least restrictive assistive device; with rolling walker Note:  With rollator

## 2018-06-28 ENCOUNTER — Ambulatory Visit: Payer: Medicare Other | Admitting: Family Medicine

## 2018-07-01 ENCOUNTER — Emergency Department (HOSPITAL_COMMUNITY): Payer: Medicare PPO

## 2018-07-01 ENCOUNTER — Encounter (HOSPITAL_COMMUNITY): Payer: Self-pay | Admitting: Emergency Medicine

## 2018-07-01 ENCOUNTER — Other Ambulatory Visit: Payer: Self-pay

## 2018-07-01 ENCOUNTER — Inpatient Hospital Stay (HOSPITAL_COMMUNITY)
Admission: EM | Admit: 2018-07-01 | Discharge: 2018-07-05 | DRG: 291 | Disposition: A | Discharge status: Skilled Nursing Facility | Payer: Medicare PPO | Attending: Family Medicine | Admitting: Family Medicine

## 2018-07-01 DIAGNOSIS — E1165 Type 2 diabetes mellitus with hyperglycemia: Secondary | ICD-10-CM | POA: Diagnosis present

## 2018-07-01 DIAGNOSIS — K222 Esophageal obstruction: Secondary | ICD-10-CM | POA: Diagnosis present

## 2018-07-01 DIAGNOSIS — I2510 Atherosclerotic heart disease of native coronary artery without angina pectoris: Secondary | ICD-10-CM | POA: Diagnosis present

## 2018-07-01 DIAGNOSIS — J9622 Acute and chronic respiratory failure with hypercapnia: Secondary | ICD-10-CM | POA: Diagnosis present

## 2018-07-01 DIAGNOSIS — T380X5A Adverse effect of glucocorticoids and synthetic analogues, initial encounter: Secondary | ICD-10-CM | POA: Diagnosis present

## 2018-07-01 DIAGNOSIS — Z884 Allergy status to anesthetic agent status: Secondary | ICD-10-CM

## 2018-07-01 DIAGNOSIS — M545 Low back pain: Secondary | ICD-10-CM | POA: Diagnosis present

## 2018-07-01 DIAGNOSIS — I4891 Unspecified atrial fibrillation: Secondary | ICD-10-CM | POA: Diagnosis present

## 2018-07-01 DIAGNOSIS — E872 Acidosis: Secondary | ICD-10-CM | POA: Diagnosis present

## 2018-07-01 DIAGNOSIS — Z86010 Personal history of colonic polyps: Secondary | ICD-10-CM

## 2018-07-01 DIAGNOSIS — K219 Gastro-esophageal reflux disease without esophagitis: Secondary | ICD-10-CM | POA: Diagnosis present

## 2018-07-01 DIAGNOSIS — Z7189 Other specified counseling: Secondary | ICD-10-CM

## 2018-07-01 DIAGNOSIS — M1990 Unspecified osteoarthritis, unspecified site: Secondary | ICD-10-CM | POA: Diagnosis present

## 2018-07-01 DIAGNOSIS — F17200 Nicotine dependence, unspecified, uncomplicated: Secondary | ICD-10-CM | POA: Diagnosis present

## 2018-07-01 DIAGNOSIS — Z7982 Long term (current) use of aspirin: Secondary | ICD-10-CM

## 2018-07-01 DIAGNOSIS — D649 Anemia, unspecified: Secondary | ICD-10-CM | POA: Diagnosis present

## 2018-07-01 DIAGNOSIS — Z7901 Long term (current) use of anticoagulants: Secondary | ICD-10-CM

## 2018-07-01 DIAGNOSIS — J441 Chronic obstructive pulmonary disease with (acute) exacerbation: Secondary | ICD-10-CM | POA: Diagnosis not present

## 2018-07-01 DIAGNOSIS — Z9981 Dependence on supplemental oxygen: Secondary | ICD-10-CM

## 2018-07-01 DIAGNOSIS — Z8673 Personal history of transient ischemic attack (TIA), and cerebral infarction without residual deficits: Secondary | ICD-10-CM

## 2018-07-01 DIAGNOSIS — N401 Enlarged prostate with lower urinary tract symptoms: Secondary | ICD-10-CM | POA: Diagnosis present

## 2018-07-01 DIAGNOSIS — Z66 Do not resuscitate: Secondary | ICD-10-CM

## 2018-07-01 DIAGNOSIS — I714 Abdominal aortic aneurysm, without rupture: Secondary | ICD-10-CM | POA: Diagnosis present

## 2018-07-01 DIAGNOSIS — F419 Anxiety disorder, unspecified: Secondary | ICD-10-CM | POA: Diagnosis present

## 2018-07-01 DIAGNOSIS — I110 Hypertensive heart disease with heart failure: Secondary | ICD-10-CM | POA: Diagnosis not present

## 2018-07-01 DIAGNOSIS — E119 Type 2 diabetes mellitus without complications: Secondary | ICD-10-CM

## 2018-07-01 DIAGNOSIS — Z794 Long term (current) use of insulin: Secondary | ICD-10-CM

## 2018-07-01 DIAGNOSIS — F0390 Unspecified dementia without behavioral disturbance: Secondary | ICD-10-CM | POA: Diagnosis present

## 2018-07-01 DIAGNOSIS — F17210 Nicotine dependence, cigarettes, uncomplicated: Secondary | ICD-10-CM | POA: Diagnosis present

## 2018-07-01 DIAGNOSIS — I480 Paroxysmal atrial fibrillation: Secondary | ICD-10-CM | POA: Diagnosis present

## 2018-07-01 DIAGNOSIS — Z515 Encounter for palliative care: Secondary | ICD-10-CM | POA: Diagnosis present

## 2018-07-01 DIAGNOSIS — I5033 Acute on chronic diastolic (congestive) heart failure: Secondary | ICD-10-CM | POA: Diagnosis present

## 2018-07-01 DIAGNOSIS — J9621 Acute and chronic respiratory failure with hypoxia: Secondary | ICD-10-CM | POA: Diagnosis present

## 2018-07-01 DIAGNOSIS — Z79899 Other long term (current) drug therapy: Secondary | ICD-10-CM

## 2018-07-01 DIAGNOSIS — Z87442 Personal history of urinary calculi: Secondary | ICD-10-CM

## 2018-07-01 DIAGNOSIS — I491 Atrial premature depolarization: Secondary | ICD-10-CM | POA: Diagnosis present

## 2018-07-01 DIAGNOSIS — Z7951 Long term (current) use of inhaled steroids: Secondary | ICD-10-CM

## 2018-07-01 DIAGNOSIS — I10 Essential (primary) hypertension: Secondary | ICD-10-CM | POA: Diagnosis present

## 2018-07-01 DIAGNOSIS — G8929 Other chronic pain: Secondary | ICD-10-CM | POA: Diagnosis present

## 2018-07-01 DIAGNOSIS — E785 Hyperlipidemia, unspecified: Secondary | ICD-10-CM

## 2018-07-01 LAB — CBC
HEMATOCRIT: 36 % — AB (ref 39.0–52.0)
HEMOGLOBIN: 10 g/dL — AB (ref 13.0–17.0)
MCH: 20.4 pg — AB (ref 26.0–34.0)
MCHC: 27.8 g/dL — AB (ref 30.0–36.0)
MCV: 73.3 fL — ABNORMAL LOW (ref 80.0–100.0)
Platelets: 288 10*3/uL (ref 150–400)
RBC: 4.91 MIL/uL (ref 4.22–5.81)
RDW: 20.2 % — ABNORMAL HIGH (ref 11.5–15.5)
WBC: 13.9 10*3/uL — ABNORMAL HIGH (ref 4.0–10.5)
nRBC: 0 % (ref 0.0–0.2)

## 2018-07-01 LAB — BLOOD GAS, ARTERIAL
Acid-Base Excess: 12.4 mmol/L — ABNORMAL HIGH (ref 0.0–2.0)
Bicarbonate: 35.2 mmol/L — ABNORMAL HIGH (ref 20.0–28.0)
Drawn by: 234301
O2 Content: 3 L/min
O2 Saturation: 95.1 %
PCO2 ART: 59.3 mmHg — AB (ref 32.0–48.0)
PH ART: 7.417 (ref 7.350–7.450)
Patient temperature: 37
pO2, Arterial: 81.6 mmHg — ABNORMAL LOW (ref 83.0–108.0)

## 2018-07-01 LAB — BASIC METABOLIC PANEL
Anion gap: 9 (ref 5–15)
BUN: 13 mg/dL (ref 8–23)
CALCIUM: 9.3 mg/dL (ref 8.9–10.3)
CHLORIDE: 96 mmol/L — AB (ref 98–111)
CO2: 36 mmol/L — AB (ref 22–32)
CREATININE: 0.78 mg/dL (ref 0.61–1.24)
GFR calc Af Amer: >60 mL/min (ref >60)
GFR calc non Af Amer: >60 mL/min (ref >60)
Glucose, Bld: 115 mg/dL — ABNORMAL HIGH (ref 70–99)
Potassium: 4 mmol/L (ref 3.5–5.1)
Sodium: 141 mmol/L (ref 135–145)

## 2018-07-01 LAB — BRAIN NATRIURETIC PEPTIDE: B Natriuretic Peptide: 92 pg/mL (ref 0.0–100.0)

## 2018-07-01 LAB — GLUCOSE, CAPILLARY: GLUCOSE-CAPILLARY: 190 mg/dL — AB (ref 70–99)

## 2018-07-01 LAB — TROPONIN I: Troponin I: <0.03 ng/mL (ref <0.03)

## 2018-07-01 MED ORDER — ONDANSETRON HCL 4 MG/2ML IJ SOLN: 4.0000 mg | Freq: Four times a day (QID) | INTRAMUSCULAR | Status: DC | PRN

## 2018-07-01 MED ORDER — ALBUTEROL SULFATE (2.5 MG/3ML) 0.083% IN NEBU
2.5000 mg | INHALATION_SOLUTION | Freq: Once | RESPIRATORY_TRACT | Status: AC
  Administered 2018-07-01: 2.5 mg via RESPIRATORY_TRACT

## 2018-07-01 MED ORDER — APIXABAN 5 MG PO TABS
5.0000 mg | ORAL_TABLET | Freq: Two times a day (BID) | ORAL | Status: DC
  Administered 2018-07-01 – 2018-07-05 (×8): 5 mg via ORAL
  Filled 2018-07-01 (×8): qty 1

## 2018-07-01 MED ORDER — METHYLPREDNISOLONE SODIUM SUCC 125 MG IJ SOLR: 125.0000 mg | Freq: Once | INTRAMUSCULAR | Status: DC

## 2018-07-01 MED ORDER — ACETAMINOPHEN 650 MG RE SUPP: 650.0000 mg | Freq: Four times a day (QID) | RECTAL | Status: DC | PRN

## 2018-07-01 MED ORDER — ONDANSETRON HCL 4 MG PO TABS: 4.0000 mg | ORAL_TABLET | Freq: Four times a day (QID) | ORAL | Status: DC | PRN

## 2018-07-01 MED ORDER — ROSUVASTATIN CALCIUM 20 MG PO TABS
20.0000 mg | ORAL_TABLET | Freq: Every day | ORAL | Status: DC
  Administered 2018-07-01 – 2018-07-04 (×4): 20 mg via ORAL
  Filled 2018-07-01 (×4): qty 1

## 2018-07-01 MED ORDER — IPRATROPIUM-ALBUTEROL 0.5-2.5 (3) MG/3ML IN SOLN
3.0000 mL | Freq: Once | RESPIRATORY_TRACT | Status: AC
  Administered 2018-07-01: 3 mL via RESPIRATORY_TRACT

## 2018-07-01 MED ORDER — ACETAMINOPHEN 325 MG PO TABS: 650.0000 mg | ORAL_TABLET | Freq: Four times a day (QID) | ORAL | Status: DC | PRN

## 2018-07-01 MED ORDER — ALBUTEROL SULFATE (2.5 MG/3ML) 0.083% IN NEBU
INHALATION_SOLUTION | RESPIRATORY_TRACT | Status: AC
  Administered 2018-07-01: 2.5 mg via RESPIRATORY_TRACT
  Filled 2018-07-01: qty 3

## 2018-07-01 MED ORDER — IPRATROPIUM-ALBUTEROL 0.5-2.5 (3) MG/3ML IN SOLN
RESPIRATORY_TRACT | Status: AC
  Administered 2018-07-01: 3 mL via RESPIRATORY_TRACT
  Filled 2018-07-01: qty 3

## 2018-07-01 MED ORDER — METHYLPREDNISOLONE SODIUM SUCC 125 MG IJ SOLR
60.0000 mg | Freq: Two times a day (BID) | INTRAMUSCULAR | Status: DC
  Administered 2018-07-02: 60 mg via INTRAVENOUS
  Filled 2018-07-01: qty 2

## 2018-07-01 MED ORDER — INSULIN ASPART 100 UNIT/ML ~~LOC~~ SOLN
0.0000 [IU] | Freq: Three times a day (TID) | SUBCUTANEOUS | Status: DC
  Administered 2018-07-02: 5 [IU] via SUBCUTANEOUS

## 2018-07-01 MED ORDER — IPRATROPIUM-ALBUTEROL 0.5-2.5 (3) MG/3ML IN SOLN
3.0000 mL | Freq: Four times a day (QID) | RESPIRATORY_TRACT | Status: DC
  Administered 2018-07-01 – 2018-07-02 (×3): 3 mL via RESPIRATORY_TRACT
  Filled 2018-07-01 (×2): qty 3

## 2018-07-01 MED ORDER — DM-GUAIFENESIN ER 30-600 MG PO TB12
1.0000 | ORAL_TABLET | Freq: Two times a day (BID) | ORAL | Status: DC
  Administered 2018-07-01 – 2018-07-05 (×8): 1 via ORAL
  Filled 2018-07-01 (×8): qty 1

## 2018-07-01 MED ORDER — ALBUTEROL SULFATE (2.5 MG/3ML) 0.083% IN NEBU
5.0000 mg | INHALATION_SOLUTION | Freq: Once | RESPIRATORY_TRACT | Status: AC
  Administered 2018-07-01: 5 mg via RESPIRATORY_TRACT
  Filled 2018-07-01: qty 6

## 2018-07-01 MED ORDER — SODIUM CHLORIDE 0.9 % IV SOLN
100.0000 mg | Freq: Two times a day (BID) | INTRAVENOUS | Status: DC
  Administered 2018-07-01: 100 mg via INTRAVENOUS
  Filled 2018-07-01 (×5): qty 100

## 2018-07-01 MED ORDER — IPRATROPIUM-ALBUTEROL 0.5-2.5 (3) MG/3ML IN SOLN
3.0000 mL | RESPIRATORY_TRACT | Status: DC | PRN
  Administered 2018-07-02 – 2018-07-03 (×4): 3 mL via RESPIRATORY_TRACT
  Filled 2018-07-01 (×6): qty 3

## 2018-07-01 MED ORDER — BUDESONIDE 0.25 MG/2ML IN SUSP
0.2500 mg | Freq: Two times a day (BID) | RESPIRATORY_TRACT | Status: DC
  Administered 2018-07-01 – 2018-07-02 (×2): 0.25 mg via RESPIRATORY_TRACT
  Filled 2018-07-01 (×2): qty 2

## 2018-07-01 NOTE — ED Triage Notes (Signed)
Pt is a hospice pt for COPD, with no DNR. Called for SOB increased since last night. EMS given 7.5 Albuterol, 0.5 Atrovent, and 125mg  Solumedrol. Normally on 2L [place].  C/o productive cough.

## 2018-07-01 NOTE — ED Notes (Signed)
Respiratory at bedside to collect ABG

## 2018-07-01 NOTE — ED Notes (Signed)
Pt resting with eyes closed with even respirations.

## 2018-07-01 NOTE — ED Notes (Signed)
Pt resting with eyes closed, appears to be in no distress. Respirations are even and unlabored.  

## 2018-07-01 NOTE — ED Notes (Signed)
Respiratory paged. Pt requesting to be placed back on NRB.

## 2018-07-01 NOTE — ED Provider Notes (Signed)
[facility] EMERGENCY DEPARTMENT Provider Note   CSN: [id number] Arrival date & time: 07/01/18  1315     History   Chief Complaint Chief Complaint  Patient presents with  . Shortness of Breath    HPI Andrew [name] is a 77 y.o. male.  HPI Patient presents emergency room with complaints of shortness of breath.  Patient has history of COPD.  He is on chronic oxygen and is on hospice.  Patient states last night he started having trouble with shortness of breath.  Patient had been using his breathing treatments without much improvement.  This morning he was feeling worse so he called EMS.  He was given albuterol Atrovent and Solu-Medrol.  Patient is better than earlier.  He denies any recent fevers.  No chest pain.  He does have some leg swelling but that is chronic and not acutely worse.  Past Medical History:  Diagnosis Date  . A-fib ([place])   . Abdominal pain   . Adenomatous colon polyp   . Arthritis   . Asthma   . CAD (coronary artery disease)   . Cancer ([place])    skin, nose  . Cataract    bilateral  . CHF (congestive heart failure) ([place])    patient reports  . Chronic LBP   . COPD (chronic obstructive pulmonary disease) ([place])   . Decreased libido   . Diverticulosis of colon   . Esophageal stricture   . Fatigue   . GERD (gastroesophageal reflux disease)   . History of small bowel obstruction   . Hyperlipidemia   . Hypertension   . Hypertrophy of prostate with urinary obstruction and other lower urinary tract symptoms (LUTS)   . Palpitations   . Peri-rectal abscess   . Personal history of renal calculi   . Prostatitis, acute   . Stroke [facility])    pt reports  . Tobacco abuse     Patient Active Problem List   Diagnosis Date Noted  . Acute on chronic respiratory failure ([place]) 03/08/2018  . Hypokalemia 03/08/2018  . Hyponatremia 03/08/2018  . Acute respiratory failure with hypoxia ([place]) 03/08/2018  . GI bleed 02/03/2018  . Acute respiratory failure ([place]) 12/26/2017    . Aspiration pneumonia ([place]) 12/26/2017  . Dysphagia 12/26/2017  . Acute on chronic diastolic CHF (congestive heart failure) ([place]) 12/26/2017  . Acute on chronic respiratory failure with hypoxia ([place]) 11/17/2017  . COPD with exacerbation ([place]) 11/17/2017  . Hospital acquired PNA 05/14/2017  . COPD exacerbation ([place])   . HCAP (healthcare-associated pneumonia)   . Acute respiratory failure with hypoxia and hypercapnia ([place]) 04/12/2017  . Pneumonia 10/24/2016  . AAA (abdominal aortic aneurysm) ([place]) 01/04/2011  . Dizziness, nonspecific 12/26/2010  . UNSPECIFIED PERIPHERAL VASCULAR DISEASE 09/23/2010  . LEG CRAMPS 09/13/2010  . Diabetes mellitus ([place]) 02/11/2010  . CHRONIC OBSTRUCTIVE PULMONARY DISEASE, ACUTE EXACERBATION 02/11/2010  . OLECRANON BURSITIS, RIGHT 01/26/2010  . PENILE PAIN 12/29/2009  . ABNORMAL EJACULATION 05/05/2009  . LARYNGITIS, ACUTE 04/21/2009  . ATRIAL FIBRILLATION 04/08/2009  . PALPITATIONS 04/08/2009  . FATIGUE 03/31/2009  . LIBIDO, DECREASED 03/31/2009  . HYPERTROPHY PROSTATE W/UR OBST & OTH LUTS 03/25/2009  . ACUTE PROSTATITIS 03/25/2009  . SMALL BOWEL OBSTRUCTION, HX OF 11/28/2007  . HLD (hyperlipidemia) 11/13/2007  . COPD 10/02/2007  . LOW BACK PAIN, CHRONIC 10/02/2007  . ADENOMATOUS COLONIC POLYP 05/22/2007  . ESOPHAGEAL STRICTURE 05/22/2007  . Diverticulosis of colon 05/22/2007  . TOBACCO ABUSE 05/08/2007  . Essential hypertension 05/08/2007  . Coronary atherosclerosis  05/08/2007  . ASTHMA 05/08/2007  . GERD 05/08/2007  . RENAL CALCULUS, HX OF 05/08/2007  . ABSCESS, PERIRECTAL, HX OF 05/08/2007  . ARTHRITIS, HX OF 05/08/2007  . PENILE PROSTHESIS 05/08/2007  . LAMINECTOMY, LUMBAR, HX OF 05/08/2007    Past Surgical History:  Procedure Laterality Date  . CATARACT EXTRACTION  04/2010   bilateral  . HERNIA REPAIR    . LUMBAR LAMINECTOMY     with Harrington rods  . PENILE PROSTHESIS PLACEMENT    . TONSILLECTOMY          Home Medications     Prior to Admission medications   Medication Sig Start Date End Date Taking? Authorizing Provider  acetaminophen (TYLENOL) 650 MG CR tablet Take 650 mg by mouth every 8 (eight) hours as needed for pain.    [provider]  albuterol (PROVENTIL) (2.5 MG/3ML) 0.083% nebulizer solution Take 3 mLs (2.5 mg total) by nebulization every 4 (four) hours as needed for wheezing or shortness of breath. 07/03/17   Arrien, Jimmy Picket, MD  apixaban (ELIQUIS) 5 MG TABS tablet Take 1 tablet (5 mg total) by mouth 2 (two) times daily. Restart after 5days on 6/9 02/11/18 04/12/18  Domenic Polite, MD  aspirin EC 325 MG tablet Take 325 mg by mouth daily.    [provider]  budesonide-formoterol (SYMBICORT) 160-4.5 MCG/ACT inhaler Inhale 2 puffs into the lungs 2 (two) times daily.    [provider]  diltiazem (TIAZAC) 300 MG 24 hr capsule Take 1 capsule (300 mg total) by mouth daily. 07/03/17 04/12/18  Arrien, Jimmy Picket, MD  docusate sodium (COLACE) 100 MG capsule Take 100 mg by mouth daily.    [provider]  furosemide (LASIX) 40 MG tablet Take 1 tablet (40 mg total) by mouth daily. 12/31/17   Johnson, Clanford L, MD  insulin aspart (NOVOLOG) 100 UNIT/ML FlexPen Check CBG's there etimes daily before meals. For CBG less than 150: 0 Units For CBG 150-200: 2 Units SQ For CBG 200-250: 4 Units SQ For CBG 250-300: 6 Units SQ For CBG 300-350: 8 Units SQ and call PCP for further instructions Patient taking differently: Inject 0-8 Units into the skin See admin instructions. Check CBG's there etimes daily before meals. For CBG less than 150: 0 Units For CBG 150-200: 2 Units SQ For CBG 200-250: 4 Units SQ For CBG 250-300: 6 Units SQ For CBG 300-350: 8 Units SQ and call PCP for further instructions 07/03/17   Arrien, Jimmy Picket, MD  insulin glargine (LANTUS) 100 unit/mL SOPN Inject 0.25 mLs (25 Units total) into the skin daily. 12/30/17 04/12/18  Johnson, Clanford L, MD   Melatonin 3 MG TABS Take 1 tablet by mouth at bedtime.    [provider]  metoCLOPramide (REGLAN) 10 MG tablet Take 10 mg by mouth 4 (four) times daily.     [provider]  montelukast (SINGULAIR) 10 MG tablet Take 10 mg by mouth at bedtime.    [provider]  Morphine Sulfate (MORPHINE CONCENTRATE) 10 mg / 0.5 ml concentrated solution Take 0.25 mLs by mouth every 3 (three) hours as needed for shortness of breath. 01/06/18   [provider]  nicotine (NICODERM CQ - DOSED IN MG/24 HOURS) 21 mg/24hr patch Place 1 patch (21 mg total) onto the skin daily. 04/15/18   Barton Dubois, MD  pantoprazole (PROTONIX) 40 MG tablet Take 1 tablet (40 mg total) by mouth daily. 04/15/18   Barton Dubois, MD  potassium chloride SA (K-DUR,KLOR-CON) 20 MEQ  tablet Take 1 tablet (20 mEq total) by mouth daily. 03/11/18 04/12/18  Johnson, Clanford L, MD  predniSONE (DELTASONE) 10 MG tablet Take 6 tablets by mouth daily x1 day; then 5 tablets by mouth daily x2-day; then 3 tablets by mouth daily x3 days; then start taking 2 tablets by mouth on daily basis. 04/14/18   Barton Dubois, MD  rosuvastatin (CRESTOR) 20 MG tablet Take 1 tablet (20 mg total) by mouth daily. 07/03/17   Arrien, Jimmy Picket, MD  tiotropium (SPIRIVA HANDIHALER) 18 MCG inhalation capsule Place 1 capsule (18 mcg total) into inhaler and inhale daily. 07/03/17 07/03/18  Arrien, Jimmy Picket, MD  tiZANidine (ZANAFLEX) 4 MG tablet Take 4 mg by mouth 3 (three) times daily.    [provider]  vitamin B-12 (CYANOCOBALAMIN) 500 MCG tablet Take 1,000 mcg by mouth daily.     [provider]    Family History Family History  Problem Relation Age of Onset  . Heart disease Brother        CAD  . Diabetes Brother   . COPD Brother   . Emphysema Brother     Social History Social History   Tobacco Use  . Smoking status: Former Smoker    Packs/day: 1.00    Types: Cigarettes    Last attempt to quit:  01/18/2018    Years since quitting: 0.4  . Smokeless tobacco: Never Used  Substance Use Topics  . Alcohol use: No  . Drug use: No     Allergies   Procaine and Procaine hcl   Review of Systems Review of Systems  All other systems reviewed and are negative.    Physical Exam Updated Vital Signs BP 126/71   Pulse 80   Resp 17   Wt 74 kg   SpO2 95%   BMI 26.33 kg/m   Physical Exam  Constitutional: He appears well-developed and well-nourished. No distress.  She is able to speak in full sentences  HENT:  Head: Normocephalic and atraumatic.  Right Ear: External ear normal.  Left Ear: External ear normal.  Eyes: Conjunctivae are normal. Right eye exhibits no discharge. Left eye exhibits no discharge. No scleral icterus.  Neck: Neck supple. No tracheal deviation present.  Cardiovascular: Normal rate, regular rhythm and intact distal pulses.  Pulmonary/Chest: Accessory muscle usage present. No stridor. No respiratory distress. He has wheezes. He has no rales.  Abdominal: Soft. Bowel sounds are normal. He exhibits no distension. There is no tenderness. There is no rebound and no guarding.  Musculoskeletal: He exhibits no tenderness.       Right lower leg: He exhibits edema.       Left lower leg: He exhibits edema.  Mild pitting edema below the knee bilaterally  Neurological: He is alert. He has normal strength. No cranial nerve deficit (no facial droop, extraocular movements intact, no slurred speech) or sensory deficit. He exhibits normal muscle tone. He displays no seizure activity. Coordination normal.  Skin: Skin is warm and dry. No rash noted.  Psychiatric: He has a normal mood and affect.  Nursing note and vitals reviewed.    ED Treatments / Results  Labs (all labs ordered are listed, but only abnormal results are displayed) Labs Reviewed  BASIC METABOLIC PANEL - Abnormal; Notable for the following components:      Result Value   Chloride 96 (*)    CO2 36 (*)     Glucose, Bld 115 (*)    All other components within normal limits  CBC - Abnormal; Notable for the following components:   WBC 13.9 (*)    Hemoglobin 10.0 (*)    HCT 36.0 (*)    MCV 73.3 (*)    MCH 20.4 (*)    MCHC 27.8 (*)    RDW 20.2 (*)    All other components within normal limits  BRAIN NATRIURETIC PEPTIDE  TROPONIN I    EKG EKG Interpretation  Date/Time:  Sunday July 01 2018 13:18:56 EDT Ventricular Rate:  90 PR Interval:    QRS Duration: 98 QT Interval:  362 QTC Calculation: 443 R Axis:   91 Text Interpretation:  Sinus rhythm Atrial premature complexes Right axis deviation No significant change since last tracing Confirmed by Dorie Rank [phone]) on 07/01/2018 1:38:57 PM   Radiology Dg Chest Portable 1 View  Result Date: 07/01/2018 CLINICAL DATA:  COPD. EXAM: PORTABLE CHEST 1 VIEW COMPARISON:  04/11/2018 FINDINGS: Stable borderline cardiomegaly with mild aortic atherosclerosis. Slight pulmonary hyperinflation is specially of the right lung. Interstitial edema without alveolar consolidation. Left basilar atelectasis and/or scarring is stable. No pleural effusion or pneumothorax. No acute osseous abnormality. IMPRESSION: Similar appearance of the chest with hyperinflated lungs, mild interstitial edema, borderline cardiomegaly and minimal aortic atherosclerosis. Electronically Signed   By: Ashley Royalty M.D.   On: 07/01/2018 14:10    Procedures Procedures (including critical care time)  Medications Ordered in ED Medications  ipratropium-albuterol (DUONEB) 0.5-2.5 (3) MG/3ML nebulizer solution 3 mL (3 mLs Nebulization Given 07/01/18 1342)  albuterol (PROVENTIL) (2.5 MG/3ML) 0.083% nebulizer solution 2.5 mg (2.5 mg Nebulization Given 07/01/18 1342)  albuterol (PROVENTIL) (2.5 MG/3ML) 0.083% nebulizer solution 5 mg (5 mg Nebulization Given 07/01/18 1612)     Initial Impression / Assessment and Plan / ED Course  I have reviewed the triage vital signs and the nursing  notes.  Pertinent labs & imaging results that were available during my care of the patient were reviewed by me and considered in my medical decision making (see chart for details).  Clinical Course as of Jul 01 1733  Sun Jul 01, 2018  1732 Patient reexamined.  He continues to feel short of breath and still has wheezes.  Patient does not feel well enough to go home   [JK]  1732 lAbs reviewed.  Mild leukocytosis.  Chest x-ray without pneumonia.   [JK]    Clinical Course User Index [JK] Dorie Rank, MD  Patient presented to the emergency room for evaluation of shortness of breath.  Patient has a history of COPD.  On exam he had significant wheezing.  He had evidence of accessory muscle usage.  Patient responded to breathing treatments.  He is feeling better however he continues to have shortness of breath.  He does not feel that he is at his baseline and is capable of going home.  I will consult the medical service for admission and further treatment.    Final Clinical Impressions(s) / ED Diagnoses   Final diagnoses:  COPD exacerbation ([place])       Dorie Rank, MD 07/01/18 1734

## 2018-07-01 NOTE — ED Notes (Signed)
Respiratory paged at this time for tx.  

## 2018-07-01 NOTE — ED Notes (Signed)
Admitting provider at bedside.

## 2018-07-01 NOTE — ED Notes (Signed)
Respiratory at bedside.

## 2018-07-01 NOTE — H&P (Addendum)
History and Physical    Andrew [name] LPF:[id number] DOB: Jul 25, 1941 DOA: 07/01/2018  PCP: [facility]   Patient coming from: Home  Chief Complaint: SOB,cough  HPI: Andrew [name] is a 77 y.o. 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 ?Sputum color as he swallows. Patient still smoked cigs 1 PPD. He is on chronic O2. Patient is also on Home hospice, he cannot remember when last they came to check on him, doesn't remember the name of the agency or have their number.  Patient denies chest pain or fever, reports stable and unchanged lower extremity swelling.  5 hopsital admissions since march this year- 4 of which were for COPD exacerbation.patient was given 125 mg Solu-Medrol by EMS.  He was placed on rebreather mask for shortness of breath but no documented hypoxia.   ED Course: Stable vitals.  Patient switched to nasal cannula but patient was gasping, so was placed on high flow nasal cannula.  WBC elevated 13.9 .  Stable chronic anemia- 10.  Chronic elevated bicarb 36.  EKG sinus rhythm, artifacts, normal intervals. Normal troponin. two-view chest x-ray hyperinflation.  He was given breathing treatments in Ed,  With persistent shortness of breath hospitalist called to admit.   Review of Systems: As per HPI all other systems reviewed and negative.  Past Medical History:  Diagnosis Date  . A-fib ([place])   . Abdominal pain   . Adenomatous colon polyp   . Arthritis   . Asthma   . CAD (coronary artery disease)   . Cancer ([place])    skin, nose  . Cataract    bilateral  . CHF (congestive heart failure) ([place])    patient reports  . Chronic LBP   . COPD (chronic obstructive pulmonary disease) ([place])   . Decreased libido   . Diverticulosis of colon   . Esophageal stricture   . Fatigue   . GERD  (gastroesophageal reflux disease)   . History of small bowel obstruction   . Hyperlipidemia   . Hypertension   . Hypertrophy of prostate with urinary obstruction and other lower urinary tract symptoms (LUTS)   . Palpitations   . Peri-rectal abscess   . Personal history of renal calculi   . Prostatitis, acute   . Stroke [facility])    pt reports  . Tobacco abuse     Past Surgical History:  Procedure Laterality Date  . CATARACT EXTRACTION  04/2010   bilateral  . HERNIA REPAIR    . LUMBAR LAMINECTOMY     with Harrington rods  . PENILE PROSTHESIS PLACEMENT    . TONSILLECTOMY       reports that he quit smoking about 5 months ago. His smoking use included cigarettes. He smoked 1.00 pack per day. He has never used smokeless tobacco. He reports that he does not drink alcohol or use drugs.  Allergies  Allergen Reactions  . Procaine Shortness Of Breath and Other (See Comments)    Syncope.   . Procaine Hcl Anaphylaxis and Swelling    Family History  Problem Relation Age of Onset  . Heart disease Brother        CAD  . Diabetes Brother   . COPD Brother   . Emphysema Brother     Prior to Admission medications   Medication Sig Start Date End Date Taking?  Authorizing Provider  acetaminophen (TYLENOL) 650 MG CR tablet Take 650 mg by mouth every 8 (eight) hours as needed for pain.    [provider]  albuterol (PROVENTIL) (2.5 MG/3ML) 0.083% nebulizer solution Take 3 mLs (2.5 mg total) by nebulization every 4 (four) hours as needed for wheezing or shortness of breath. 07/03/17   Arrien, Jimmy Picket, MD  apixaban (ELIQUIS) 5 MG TABS tablet Take 1 tablet (5 mg total) by mouth 2 (two) times daily. Restart after 5days on 6/9 02/11/18 04/12/18  Domenic Polite, MD  aspirin EC 325 MG tablet Take 325 mg by mouth daily.    [provider]  budesonide-formoterol (SYMBICORT) 160-4.5 MCG/ACT inhaler Inhale 2 puffs into the lungs 2 (two) times daily.    [provider]    diltiazem (TIAZAC) 300 MG 24 hr capsule Take 1 capsule (300 mg total) by mouth daily. 07/03/17 04/12/18  Arrien, Jimmy Picket, MD  docusate sodium (COLACE) 100 MG capsule Take 100 mg by mouth daily.    [provider]  furosemide (LASIX) 40 MG tablet Take 1 tablet (40 mg total) by mouth daily. 12/31/17   Johnson, Clanford L, MD  insulin aspart (NOVOLOG) 100 UNIT/ML FlexPen Check CBG's there etimes daily before meals. For CBG less than 150: 0 Units For CBG 150-200: 2 Units SQ For CBG 200-250: 4 Units SQ For CBG 250-300: 6 Units SQ For CBG 300-350: 8 Units SQ and call PCP for further instructions Patient taking differently: Inject 0-8 Units into the skin See admin instructions. Check CBG's there etimes daily before meals. For CBG less than 150: 0 Units For CBG 150-200: 2 Units SQ For CBG 200-250: 4 Units SQ For CBG 250-300: 6 Units SQ For CBG 300-350: 8 Units SQ and call PCP for further instructions 07/03/17   Arrien, Jimmy Picket, MD  insulin glargine (LANTUS) 100 unit/mL SOPN Inject 0.25 mLs (25 Units total) into the skin daily. 12/30/17 04/12/18  Johnson, Clanford L, MD  Melatonin 3 MG TABS Take 1 tablet by mouth at bedtime.    [provider]  metoCLOPramide (REGLAN) 10 MG tablet Take 10 mg by mouth 4 (four) times daily.     [provider]  montelukast (SINGULAIR) 10 MG tablet Take 10 mg by mouth at bedtime.    [provider]  Morphine Sulfate (MORPHINE CONCENTRATE) 10 mg / 0.5 ml concentrated solution Take 0.25 mLs by mouth every 3 (three) hours as needed for shortness of breath. 01/06/18   [provider]  nicotine (NICODERM CQ - DOSED IN MG/24 HOURS) 21 mg/24hr patch Place 1 patch (21 mg total) onto the skin daily. 04/15/18   Barton Dubois, MD  pantoprazole (PROTONIX) 40 MG tablet Take 1 tablet (40 mg total) by mouth daily. 04/15/18   Barton Dubois, MD  potassium chloride SA (K-DUR,KLOR-CON) 20 MEQ tablet Take 1 tablet (20 mEq total) by mouth  daily. 03/11/18 04/12/18  Johnson, Clanford L, MD  predniSONE (DELTASONE) 10 MG tablet Take 6 tablets by mouth daily x1 day; then 5 tablets by mouth daily x2-day; then 3 tablets by mouth daily x3 days; then start taking 2 tablets by mouth on daily basis. 04/14/18   Barton Dubois, MD  rosuvastatin (CRESTOR) 20 MG tablet Take 1 tablet (20 mg total) by mouth daily. 07/03/17   Arrien, Jimmy Picket, MD  tiotropium (SPIRIVA HANDIHALER) 18 MCG inhalation capsule Place 1 capsule (18 mcg total) into inhaler and inhale daily. 07/03/17 07/03/18  Arrien, Jimmy Picket, MD  tiZANidine (ZANAFLEX)  4 MG tablet Take 4 mg by mouth 3 (three) times daily.    [provider]  vitamin B-12 (CYANOCOBALAMIN) 500 MCG tablet Take 1,000 mcg by mouth daily.     [provider]    Physical Exam: Vitals:   07/01/18 1613 07/01/18 1630 07/01/18 1700 07/01/18 1730  BP:  122/67 126/71 118/60  Pulse:  82 80 77  Resp:  20 17 17   SpO2: 100% 100% 95% 97%  Weight:        Constitutional: NAD, calm, comfortable Vitals:   07/01/18 1613 07/01/18 1630 07/01/18 1700 07/01/18 1730  BP:  122/67 126/71 118/60  Pulse:  82 80 77  Resp:  20 17 17   SpO2: 100% 100% 95% 97%  Weight:       Eyes: PERRL, lids and conjunctivae normal ENMT: Mucous membranes are moist. Posterior pharynx clear of any exudate or lesions. Neck: normal, supple, no masses, no thyromegaly Respiratory: Diffuse rhonchi bilaterally, equal breath sounds. Normal respiratory effort. No accessory muscle use.  Cardiovascular: Regular rate and rhythm, no murmurs / rubs / gallops. +1 pitting pedal edema.  Abdomen: full, no tenderness, no masses palpated. No hepatosplenomegaly. Bowel sounds positive.  Musculoskeletal: no clubbing / cyanosis. No joint deformity upper and lower extremities. Good ROM, no contractures. Normal muscle tone.  Skin: no rashes, lesions, ulcers. No induration Neurologic: CN 2-12 grossly intact.  Strength 5/5 in all 4.    Psychiatric: Normal judgment and insight. Alert and oriented x 3. Normal mood.   Labs on Admission: I have personally reviewed following labs and imaging studies  CBC: Recent Labs  Lab 07/01/18 1324  WBC 13.9*  HGB 10.0*  HCT 36.0*  MCV 73.3*  PLT 161   Basic Metabolic Panel: Recent Labs  Lab 07/01/18 1324  NA 141  K 4.0  CL 96*  CO2 36*  GLUCOSE 115*  BUN 13  CREATININE 0.78  CALCIUM 9.3   Cardiac Enzymes: Recent Labs  Lab 07/01/18 1324  TROPONINI <0.03    Radiological Exams on Admission: Dg Chest Portable 1 View  Result Date: 07/01/2018 CLINICAL DATA:  COPD. EXAM: PORTABLE CHEST 1 VIEW COMPARISON:  04/11/2018 FINDINGS: Stable borderline cardiomegaly with mild aortic atherosclerosis. Slight pulmonary hyperinflation is specially of the right lung. Interstitial edema without alveolar consolidation. Left basilar atelectasis and/or scarring is stable. No pleural effusion or pneumothorax. No acute osseous abnormality. IMPRESSION: Similar appearance of the chest with hyperinflated lungs, mild interstitial edema, borderline cardiomegaly and minimal aortic atherosclerosis. Electronically Signed   By: Ashley Royalty M.D.   On: 07/01/2018 14:10   EKG: Independently reviewed.  Repeat EKG sinus rhythm QTc prolonged 492 .  Assessment/Plan Principal Problem:   COPD with exacerbation ([place]) Active Problems:   Diabetes mellitus ([place])   TOBACCO ABUSE   Essential hypertension   ATRIAL FIBRILLATION  COPD exacerbation-end-stage, with increasing shortness of breath, worsening productive cough.  No leukocytosis.  Two-view chest x-ray- no acute abnormality.  Home hospice patient.  On chronic O2- 2L.  Ongoing tobacco abuse.  Patient is not DNR.  No documented hypoxia., Currently on HFNC. Neg Trop x 1. -Cont Solu-Medrol 60 BID -DuoNeb scheduled and PRN - Mucolytics, supplemental O2 - Doxycycline with prolonged QTC 492 - will get ABG- ph- 7.41, Pco2- 59 (chronically elevated), Po2- 81,  on HFNC.  Reflecting well compensated respiratory acidosis. -Continue home Symbicort as budesonide  Atrial fibrillation-appears to be in sinus.  Rate Controlled.  -Continue Eliquis  DM-last HgbA1c- 8.3.  Glucose 115.  - SSi -  Hold home lantus  HTN-stable. -Continue home diuretic Lasix 40 daily  Diastolic CHF-appears stable, unchanged lower extremity edema. Echo 12/2017 EF 60 to 65%, indeterminate diastolic dysfunction.  BNP- 92, stable and consistent with prior. -Continue home Lasix 40 daily  Tobacco abuse- -ongoing tobacco abuse with end-stage COPD   DVT prophylaxis: Eliquis Code Status: Full-confirmed with patient at bedside. Family Communication: None at bedside Disposition Plan: per rounding team Consults called: None Admission status: Obs, tele   Bethena Roys MD [facility] Pager [phone] From 3PM-11PM.  Otherwise please contact night-coverage [email or web address] Password [facility]  07/01/2018, 7:16 PM

## 2018-07-01 NOTE — ED Notes (Signed)
Date and time results received: 07/01/18 6:43 PM   Test: pCo2 from ABG Critical Value: 59.3  Name of Provider Notified: Emokpae  Orders Received? Or Actions Taken?: Paged MD.

## 2018-07-02 DIAGNOSIS — I714 Abdominal aortic aneurysm, without rupture: Secondary | ICD-10-CM | POA: Diagnosis present

## 2018-07-02 DIAGNOSIS — T380X5A Adverse effect of glucocorticoids and synthetic analogues, initial encounter: Secondary | ICD-10-CM | POA: Diagnosis present

## 2018-07-02 DIAGNOSIS — E785 Hyperlipidemia, unspecified: Secondary | ICD-10-CM | POA: Diagnosis present

## 2018-07-02 DIAGNOSIS — F419 Anxiety disorder, unspecified: Secondary | ICD-10-CM | POA: Diagnosis present

## 2018-07-02 DIAGNOSIS — J9621 Acute and chronic respiratory failure with hypoxia: Secondary | ICD-10-CM

## 2018-07-02 DIAGNOSIS — F0390 Unspecified dementia without behavioral disturbance: Secondary | ICD-10-CM | POA: Diagnosis present

## 2018-07-02 DIAGNOSIS — E1165 Type 2 diabetes mellitus with hyperglycemia: Secondary | ICD-10-CM | POA: Diagnosis present

## 2018-07-02 DIAGNOSIS — N401 Enlarged prostate with lower urinary tract symptoms: Secondary | ICD-10-CM | POA: Diagnosis present

## 2018-07-02 DIAGNOSIS — I480 Paroxysmal atrial fibrillation: Secondary | ICD-10-CM | POA: Diagnosis not present

## 2018-07-02 DIAGNOSIS — J9622 Acute and chronic respiratory failure with hypercapnia: Secondary | ICD-10-CM

## 2018-07-02 DIAGNOSIS — Z66 Do not resuscitate: Secondary | ICD-10-CM | POA: Diagnosis present

## 2018-07-02 DIAGNOSIS — I491 Atrial premature depolarization: Secondary | ICD-10-CM | POA: Diagnosis present

## 2018-07-02 DIAGNOSIS — I10 Essential (primary) hypertension: Secondary | ICD-10-CM | POA: Diagnosis not present

## 2018-07-02 DIAGNOSIS — Z515 Encounter for palliative care: Secondary | ICD-10-CM

## 2018-07-02 DIAGNOSIS — I482 Chronic atrial fibrillation, unspecified: Secondary | ICD-10-CM | POA: Diagnosis not present

## 2018-07-02 DIAGNOSIS — J441 Chronic obstructive pulmonary disease with (acute) exacerbation: Secondary | ICD-10-CM | POA: Diagnosis present

## 2018-07-02 DIAGNOSIS — D649 Anemia, unspecified: Secondary | ICD-10-CM | POA: Diagnosis present

## 2018-07-02 DIAGNOSIS — Z9981 Dependence on supplemental oxygen: Secondary | ICD-10-CM | POA: Diagnosis not present

## 2018-07-02 DIAGNOSIS — K219 Gastro-esophageal reflux disease without esophagitis: Secondary | ICD-10-CM | POA: Diagnosis present

## 2018-07-02 DIAGNOSIS — F17200 Nicotine dependence, unspecified, uncomplicated: Secondary | ICD-10-CM | POA: Diagnosis not present

## 2018-07-02 DIAGNOSIS — K222 Esophageal obstruction: Secondary | ICD-10-CM | POA: Diagnosis present

## 2018-07-02 DIAGNOSIS — E872 Acidosis: Secondary | ICD-10-CM | POA: Diagnosis present

## 2018-07-02 DIAGNOSIS — F17210 Nicotine dependence, cigarettes, uncomplicated: Secondary | ICD-10-CM | POA: Diagnosis present

## 2018-07-02 DIAGNOSIS — I2510 Atherosclerotic heart disease of native coronary artery without angina pectoris: Secondary | ICD-10-CM | POA: Diagnosis present

## 2018-07-02 DIAGNOSIS — I5033 Acute on chronic diastolic (congestive) heart failure: Secondary | ICD-10-CM | POA: Diagnosis not present

## 2018-07-02 DIAGNOSIS — Z7901 Long term (current) use of anticoagulants: Secondary | ICD-10-CM | POA: Diagnosis not present

## 2018-07-02 DIAGNOSIS — I110 Hypertensive heart disease with heart failure: Secondary | ICD-10-CM | POA: Diagnosis present

## 2018-07-02 LAB — GLUCOSE, CAPILLARY
GLUCOSE-CAPILLARY: 266 mg/dL — AB (ref 70–99)
GLUCOSE-CAPILLARY: 314 mg/dL — AB (ref 70–99)
Glucose-Capillary: 262 mg/dL — ABNORMAL HIGH (ref 70–99)
Glucose-Capillary: 145 mg/dL — ABNORMAL HIGH (ref 70–99)

## 2018-07-02 MED ORDER — LORAZEPAM 2 MG/ML PO CONC: 0.5000 mg | Freq: Four times a day (QID) | ORAL | Status: DC | PRN

## 2018-07-02 MED ORDER — LORAZEPAM 0.5 MG PO TABS
0.5000 mg | ORAL_TABLET | Freq: Four times a day (QID) | ORAL | Status: DC | PRN
  Administered 2018-07-03 – 2018-07-05 (×3): 0.5 mg via SUBLINGUAL
  Filled 2018-07-02 (×3): qty 1

## 2018-07-02 MED ORDER — DILTIAZEM HCL ER COATED BEADS 120 MG PO CP24
120.0000 mg | ORAL_CAPSULE | Freq: Every day | ORAL | Status: DC
  Administered 2018-07-02: 120 mg via ORAL
  Filled 2018-07-02: qty 1

## 2018-07-02 MED ORDER — MORPHINE SULFATE (PF) 2 MG/ML IV SOLN
1.0000 mg | INTRAVENOUS | Status: DC | PRN
  Administered 2018-07-03: 2 mg via INTRAVENOUS
  Filled 2018-07-02: qty 1

## 2018-07-02 MED ORDER — MORPHINE SULFATE (CONCENTRATE) 10 MG/0.5ML PO SOLN
5.0000 mg | ORAL | Status: DC | PRN
  Administered 2018-07-02: 5 mg via ORAL
  Filled 2018-07-02: qty 0.5

## 2018-07-02 MED ORDER — INSULIN GLARGINE 100 UNIT/ML ~~LOC~~ SOLN
20.0000 [IU] | Freq: Every day | SUBCUTANEOUS | Status: DC
  Administered 2018-07-02: 20 [IU] via SUBCUTANEOUS
  Filled 2018-07-02 (×2): qty 0.2

## 2018-07-02 MED ORDER — FUROSEMIDE 10 MG/ML IJ SOLN
40.0000 mg | Freq: Two times a day (BID) | INTRAMUSCULAR | Status: DC
  Administered 2018-07-02 – 2018-07-05 (×6): 40 mg via INTRAVENOUS
  Filled 2018-07-02 (×6): qty 4

## 2018-07-02 MED ORDER — DILTIAZEM HCL ER COATED BEADS 240 MG PO CP24
240.0000 mg | ORAL_CAPSULE | Freq: Every day | ORAL | Status: DC
  Administered 2018-07-03 – 2018-07-05 (×3): 240 mg via ORAL
  Filled 2018-07-02 (×3): qty 1

## 2018-07-02 MED ORDER — INSULIN ASPART 100 UNIT/ML ~~LOC~~ SOLN
0.0000 [IU] | Freq: Every day | SUBCUTANEOUS | Status: DC
  Administered 2018-07-02: 4 [IU] via SUBCUTANEOUS
  Administered 2018-07-03: 3 [IU] via SUBCUTANEOUS
  Administered 2018-07-04: 4 [IU] via SUBCUTANEOUS

## 2018-07-02 MED ORDER — BUDESONIDE 0.5 MG/2ML IN SUSP
0.5000 mg | Freq: Two times a day (BID) | RESPIRATORY_TRACT | Status: DC
  Administered 2018-07-02 – 2018-07-05 (×6): 0.5 mg via RESPIRATORY_TRACT
  Filled 2018-07-02 (×6): qty 2

## 2018-07-02 MED ORDER — ALBUTEROL SULFATE (2.5 MG/3ML) 0.083% IN NEBU
INHALATION_SOLUTION | RESPIRATORY_TRACT | Status: AC
  Administered 2018-07-02: 2.5 mg
  Filled 2018-07-02: qty 3

## 2018-07-02 MED ORDER — IPRATROPIUM-ALBUTEROL 0.5-2.5 (3) MG/3ML IN SOLN
3.0000 mL | RESPIRATORY_TRACT | Status: DC
  Administered 2018-07-02 – 2018-07-05 (×16): 3 mL via RESPIRATORY_TRACT
  Filled 2018-07-02 (×16): qty 3

## 2018-07-02 MED ORDER — POLYETHYLENE GLYCOL 3350 17 G PO PACK
17.0000 g | PACK | Freq: Every day | ORAL | Status: DC
  Administered 2018-07-02 – 2018-07-05 (×4): 17 g via ORAL
  Filled 2018-07-02 (×4): qty 1

## 2018-07-02 MED ORDER — METHYLPREDNISOLONE SODIUM SUCC 125 MG IJ SOLR
60.0000 mg | Freq: Four times a day (QID) | INTRAMUSCULAR | Status: DC
  Administered 2018-07-02 – 2018-07-04 (×9): 60 mg via INTRAVENOUS
  Filled 2018-07-02 (×9): qty 2

## 2018-07-02 MED ORDER — IPRATROPIUM-ALBUTEROL 0.5-2.5 (3) MG/3ML IN SOLN: 3.0000 mL | RESPIRATORY_TRACT | Status: DC

## 2018-07-02 MED ORDER — LORAZEPAM 0.5 MG PO TABS
0.5000 mg | ORAL_TABLET | Freq: Two times a day (BID) | ORAL | Status: DC
  Administered 2018-07-02 – 2018-07-05 (×7): 0.5 mg via ORAL
  Filled 2018-07-02 (×7): qty 1

## 2018-07-02 MED ORDER — FUROSEMIDE 10 MG/ML IJ SOLN
40.0000 mg | Freq: Once | INTRAMUSCULAR | Status: AC
  Administered 2018-07-02: 40 mg via INTRAVENOUS
  Filled 2018-07-02: qty 4

## 2018-07-02 MED ORDER — INSULIN ASPART 100 UNIT/ML ~~LOC~~ SOLN
0.0000 [IU] | Freq: Three times a day (TID) | SUBCUTANEOUS | Status: DC
  Administered 2018-07-02: 8 [IU] via SUBCUTANEOUS
  Administered 2018-07-02: 2 [IU] via SUBCUTANEOUS
  Administered 2018-07-03: 3 [IU] via SUBCUTANEOUS
  Administered 2018-07-03: 15 [IU] via SUBCUTANEOUS
  Administered 2018-07-03: 11 [IU] via SUBCUTANEOUS
  Administered 2018-07-04: 5 [IU] via SUBCUTANEOUS
  Administered 2018-07-04 (×2): 8 [IU] via SUBCUTANEOUS
  Administered 2018-07-05: 3 [IU] via SUBCUTANEOUS
  Administered 2018-07-05: 8 [IU] via SUBCUTANEOUS

## 2018-07-02 MED ORDER — DILTIAZEM HCL ER COATED BEADS 120 MG PO CP24
120.0000 mg | ORAL_CAPSULE | Freq: Once | ORAL | Status: AC
  Administered 2018-07-02: 120 mg via ORAL
  Filled 2018-07-02: qty 1

## 2018-07-02 NOTE — Progress Notes (Signed)
Pt's HR up to 180's for a few seconds and then back down to 80's. Assessed patient who denied any symptoms, no SOB or pain. Notified Dr. Maudie Mercury to make aware, stated to monitor.

## 2018-07-02 NOTE — Progress Notes (Signed)
PROGRESS NOTE  Andrew [name] PYP:[id number] DOB: Oct 30, 1940 DOA: 07/01/2018 [facility]: [facility]  Brief History:  77 year old male with a history of chronic respiratory failure on 2 L at home, tobacco abuse, proximal motion for ablation, diabetes mellitus type 2, hypertension, hyperlipidemia presenting with 1 day history of worsening cough and shortness of breath.  Unfortunately, the patient continues to smoke 1 pack/day.  This represents the patient's sixth hospital admission since March 2019.  He denies any fevers, chills, nausea, vomiting, diarrhea, abdominal pain.  He denies any worsening peripheral edema orthopnea type symptoms.  Upon presentation, BMP was essentially unremarkable.  WBC was 13.9.  Chest x-ray showed hyperinflation with chronic interstitial changes.  The patient was initially started on nonrebreather, but has been since weaned back to nasal cannula.  The patient was started on bronchodilators and IV Solu-Medrol.  Assessment/Plan: Acute on chronic respiratory failure -Secondary to COPD exacerbation and CHF exacerbation -Wean oxygen back to baseline 2 L -Pulmonary hygiene  Acute on chronic diastolic CHF -The patient appears clinically fluid overloaded -He has JVD on clinical exam -Start IV furosemide -Daily weights -Accurate I's and O's  COPD exacerbation -Increased dose of Pulmicort -Continue duo nebs -Continue IV Solu-Medrol -Viral respiratory panel  Paroxysmal atrial fibrillation -Currently in sinus rhythm -Continue apixaban -Continue diltiazem  Diabetes mellitus type 2 uncontrolled hyperglycemia -Start reduced dose Lantus -NovoLog sliding scale -Check hemoglobin A1c -02/04/2018 hemoglobin A 1C8.3  Essential hypertension -Continue diltiazem  Tobacco abuse -Tobacco cessation discussed I have discussed tobacco cessation with the patient.  I have counseled the patient regarding the negative impacts of continued tobacco use including but  not limited to lung cancer, COPD, and cardiovascular disease.  I have discussed alternatives to tobacco and modalities that may help facilitate tobacco cessation including but not limited to biofeedback, hypnosis, and medications.  Total time spent with tobacco counseling was 4 minutes.   Disposition Plan:   Home in 2-3 days  Family Communication:  No Family at bedside  Consultants: palliative medicine  Code Status:  FULL   DVT Prophylaxis:  apixaban   Procedures: As Listed in Progress Note Above  Antibiotics: None    Subjective: Patient continues to complain of some shortness of breath only slightly better than yesterday.  He has dyspnea with minimal exertion.  He denies any fevers, chills, cp,, nausea, vomiting, diarrhea, abdominal pain, dysuria, hematuria.  He has a nonproductive cough.  Objective: Vitals:   07/02/18 0234 07/02/18 0514 07/02/18 0651 07/02/18 0734  BP:   128/61   Pulse:   80   Resp:   19   Temp:   98.4 F (36.9 C)   TempSrc:   Oral   SpO2: 96% 98% 100% 98%  Weight:   75.9 kg     Intake/Output Summary (Last 24 hours) at 07/02/2018 1126 Last data filed at 07/02/2018 0400 Gross per 24 hour  Intake 264.74 ml  Output -  Net 264.74 ml   Weight change:  Exam:   General:  Pt is alert, follows commands appropriately, not in acute distress  HEENT: No icterus, No thrush, No neck mass, [place]/AT  Cardiovascular: RRR, S1/S2, no rubs, no gallops  Respiratory: Bilateral rales.  Bibasilar expiratory wheeze.  Abdomen: Soft/+BS, non tender, non distended, no guarding  Extremities: 1 + LE edema, No lymphangitis, No petechiae, No rashes, no synovitis   Data Reviewed: I have personally reviewed following labs and imaging studies Basic Metabolic Panel: Recent Labs  Lab 07/01/18 1324  NA 141  K 4.0  CL 96*  CO2 36*  GLUCOSE 115*  BUN 13  CREATININE 0.78  CALCIUM 9.3   Liver Function Tests: No results for input(s): AST, ALT, ALKPHOS, BILITOT, PROT,  ALBUMIN in the last 168 hours. No results for input(s): LIPASE, AMYLASE in the last 168 hours. No results for input(s): AMMONIA in the last 168 hours. Coagulation Profile: No results for input(s): INR, PROTIME in the last 168 hours. CBC: Recent Labs  Lab 07/01/18 1324  WBC 13.9*  HGB 10.0*  HCT 36.0*  MCV 73.3*  PLT 288   Cardiac Enzymes: Recent Labs  Lab 07/01/18 1324  TROPONINI <0.03   BNP: Invalid input(s): POCBNP CBG: Recent Labs  Lab 07/01/18 2051 07/02/18 0738  GLUCAP 190* 262*   HbA1C: No results for input(s): HGBA1C in the last 72 hours. Urine analysis:    Component Value Date/Time   COLORURINE AMBER (A) 05/05/2013 1621   APPEARANCEUR CLEAR 05/05/2013 1621   LABSPEC 1.025 05/05/2013 1621   PHURINE 5.5 05/05/2013 1621   GLUCOSEU NEGATIVE 05/05/2013 1621   HGBUR NEGATIVE 05/05/2013 1621   HGBUR negative 03/31/2009 1514   BILIRUBINUR SMALL (A) 05/05/2013 1621   KETONESUR NEGATIVE 05/05/2013 1621   PROTEINUR NEGATIVE 05/05/2013 1621   UROBILINOGEN 1.0 05/05/2013 1621   NITRITE NEGATIVE 05/05/2013 1621   LEUKOCYTESUR NEGATIVE 05/05/2013 1621   Sepsis Labs: @LABRCNTIP (procalcitonin:4,lacticidven:4) )No results found for this or any previous visit (from the past 240 hour(s)).   Scheduled Meds: . apixaban  5 mg Oral BID  . budesonide (PULMICORT) nebulizer solution  0.25 mg Nebulization BID  . dextromethorphan-guaiFENesin  1 tablet Oral BID  . insulin aspart  0-9 Units Subcutaneous TID WC  . ipratropium-albuterol  3 mL Nebulization Q6H  . methylPREDNISolone (SOLU-MEDROL) injection  60 mg Intravenous Q12H  . rosuvastatin  20 mg Oral q1800   Continuous Infusions:  Procedures/Studies: Dg Chest Portable 1 View  Result Date: 07/01/2018 CLINICAL DATA:  COPD. EXAM: PORTABLE CHEST 1 VIEW COMPARISON:  04/11/2018 FINDINGS: Stable borderline cardiomegaly with mild aortic atherosclerosis. Slight pulmonary hyperinflation is specially of the right lung.  Interstitial edema without alveolar consolidation. Left basilar atelectasis and/or scarring is stable. No pleural effusion or pneumothorax. No acute osseous abnormality. IMPRESSION: Similar appearance of the chest with hyperinflated lungs, mild interstitial edema, borderline cardiomegaly and minimal aortic atherosclerosis. Electronically Signed   By: Ashley Royalty M.D.   On: 07/01/2018 14:10    Orson Eva, DO  [facility] Pager [phone]  If 7PM-7AM, please contact night-coverage [email or web address] Password [email or web address] 07/02/2018, 11:26 AM   LOS: 0 days
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
## 2018-07-02 NOTE — Care Management (Signed)
Patient is active with [facility] Cassandra of Hospice aware patient is here and has met with patient at bedside. Cassandra aware patient is here under his [employer].

## 2018-07-02 NOTE — Progress Notes (Signed)
Late entry:  Received call from telemetry.  Pulse had increased to 182 and came back down to 70-80s.  BP WNL.  Patient resting in bed.  Denies pain reports he "just feels bad".  Dr. Carles Collet notified via text page.

## 2018-07-03 DIAGNOSIS — I10 Essential (primary) hypertension: Secondary | ICD-10-CM

## 2018-07-03 DIAGNOSIS — J9622 Acute and chronic respiratory failure with hypercapnia: Secondary | ICD-10-CM

## 2018-07-03 DIAGNOSIS — I482 Chronic atrial fibrillation, unspecified: Secondary | ICD-10-CM

## 2018-07-03 DIAGNOSIS — J9621 Acute and chronic respiratory failure with hypoxia: Secondary | ICD-10-CM

## 2018-07-03 DIAGNOSIS — F17200 Nicotine dependence, unspecified, uncomplicated: Secondary | ICD-10-CM

## 2018-07-03 LAB — BASIC METABOLIC PANEL
Anion gap: 8 (ref 5–15)
BUN: 27 mg/dL — AB (ref 8–23)
CO2: 35 mmol/L — AB (ref 22–32)
CREATININE: 0.72 mg/dL (ref 0.61–1.24)
Calcium: 9.1 mg/dL (ref 8.9–10.3)
Chloride: 95 mmol/L — ABNORMAL LOW (ref 98–111)
GFR calc Af Amer: >60 mL/min (ref >60)
GFR calc non Af Amer: >60 mL/min (ref >60)
GLUCOSE: 295 mg/dL — AB (ref 70–99)
Potassium: 4.1 mmol/L (ref 3.5–5.1)
Sodium: 138 mmol/L (ref 135–145)

## 2018-07-03 LAB — HEMOGLOBIN A1C
HEMOGLOBIN A1C: 8.1 % — AB (ref 4.8–5.6)
MEAN PLASMA GLUCOSE: 185.77 mg/dL

## 2018-07-03 LAB — GLUCOSE, CAPILLARY
GLUCOSE-CAPILLARY: 265 mg/dL — AB (ref 70–99)
Glucose-Capillary: 312 mg/dL — ABNORMAL HIGH (ref 70–99)
Glucose-Capillary: 353 mg/dL — ABNORMAL HIGH (ref 70–99)
Glucose-Capillary: 159 mg/dL — ABNORMAL HIGH (ref 70–99)

## 2018-07-03 LAB — MAGNESIUM: Magnesium: 2.1 mg/dL (ref 1.7–2.4)

## 2018-07-03 MED ORDER — INSULIN ASPART 100 UNIT/ML ~~LOC~~ SOLN
4.0000 [IU] | Freq: Three times a day (TID) | SUBCUTANEOUS | Status: DC
  Administered 2018-07-03 – 2018-07-05 (×6): 4 [IU] via SUBCUTANEOUS

## 2018-07-03 MED ORDER — INSULIN GLARGINE 100 UNIT/ML ~~LOC~~ SOLN
25.0000 [IU] | Freq: Every day | SUBCUTANEOUS | Status: DC
  Administered 2018-07-03 – 2018-07-04 (×2): 25 [IU] via SUBCUTANEOUS
  Filled 2018-07-03 (×3): qty 0.25

## 2018-07-03 NOTE — NC FL2 (Signed)
[place] LEVEL OF CARE SCREENING TOOL     IDENTIFICATION  Patient Name: Andrew [name] Birthdate: [birth year] Sex: male Admission Date (Current Location): 07/01/2018  [facility] and [place] Number:  [employer] and Address:  [facility] [address], [place]      Provider Number: [id number]  Attending Physician Name and Address:  Orson Eva, MD  Relative Name and Phone Number:       Current Level of Care: Hospital Recommended Level of Care: [place] Prior Approval Number:    Date Approved/Denied:   PASRR Number: [id number]  Discharge Plan: SNF    Current Diagnoses: Patient Active Problem List   Diagnosis Date Noted  . COPD exacerbation ([place]) 07/02/2018  . Acute on chronic respiratory failure with hypoxia and hypercapnia ([place]) 07/02/2018  . DNR (do not resuscitate)   . Palliative care encounter   . Acute on chronic respiratory failure ([place]) 03/08/2018  . Hypokalemia 03/08/2018  . Hyponatremia 03/08/2018  . Acute respiratory failure with hypoxia ([place]) 03/08/2018  . GI bleed 02/03/2018  . Acute respiratory failure ([place]) 12/26/2017  . Aspiration pneumonia ([place]) 12/26/2017  . Dysphagia 12/26/2017  . Acute on chronic diastolic CHF (congestive heart failure) ([place]) 12/26/2017  . Acute on chronic respiratory failure with hypoxia ([place]) 11/17/2017  . COPD with exacerbation ([place]) 11/17/2017  . Hospital acquired PNA 05/14/2017  . HCAP (healthcare-associated pneumonia)   . Acute respiratory failure with hypoxia and hypercapnia ([place]) 04/12/2017  . Pneumonia 10/24/2016  . AAA (abdominal aortic aneurysm) ([place]) 01/04/2011  . Dizziness, nonspecific 12/26/2010  . UNSPECIFIED PERIPHERAL VASCULAR DISEASE 09/23/2010  . LEG CRAMPS 09/13/2010  . Diabetes mellitus ([place]) 02/11/2010  . CHRONIC OBSTRUCTIVE PULMONARY DISEASE, ACUTE EXACERBATION 02/11/2010  . OLECRANON BURSITIS, RIGHT 01/26/2010  . PENILE PAIN 12/29/2009  .  ABNORMAL EJACULATION 05/05/2009  . LARYNGITIS, ACUTE 04/21/2009  . ATRIAL FIBRILLATION 04/08/2009  . PALPITATIONS 04/08/2009  . FATIGUE 03/31/2009  . LIBIDO, DECREASED 03/31/2009  . HYPERTROPHY PROSTATE W/UR OBST & OTH LUTS 03/25/2009  . ACUTE PROSTATITIS 03/25/2009  . SMALL BOWEL OBSTRUCTION, HX OF 11/28/2007  . HLD (hyperlipidemia) 11/13/2007  . COPD 10/02/2007  . LOW BACK PAIN, CHRONIC 10/02/2007  . ADENOMATOUS COLONIC POLYP 05/22/2007  . ESOPHAGEAL STRICTURE 05/22/2007  . Diverticulosis of colon 05/22/2007  . TOBACCO ABUSE 05/08/2007  . Essential hypertension 05/08/2007  . Coronary atherosclerosis 05/08/2007  . ASTHMA 05/08/2007  . GERD 05/08/2007  . RENAL CALCULUS, HX OF 05/08/2007  . ABSCESS, PERIRECTAL, HX OF 05/08/2007  . ARTHRITIS, HX OF 05/08/2007  . PENILE PROSTHESIS 05/08/2007  . LAMINECTOMY, LUMBAR, HX OF 05/08/2007    Orientation RESPIRATION BLADDER Height & Weight     Self, Time, Situation, Place  O2(5L) Continent Weight: 167 lb 5.3 oz (75.9 kg) Height:     BEHAVIORAL SYMPTOMS/MOOD NEUROLOGICAL BOWEL NUTRITION STATUS      Continent (heart healthy/carb modified)  AMBULATORY STATUS COMMUNICATION OF NEEDS Skin   Limited Assist Verbally Normal                       Personal Care Assistance Level of Assistance  Bathing, Feeding, Dressing Bathing Assistance: Limited assistance Feeding assistance: Independent Dressing Assistance: Limited assistance     Functional Limitations Info  Sight, Hearing, Speech Sight Info: Adequate Hearing Info: Adequate Speech Info: Adequate    SPECIAL CARE FACTORS FREQUENCY  Contractures Contractures Info: Not present    Additional Factors Info  Code Status, Allergies Code Status Info: DNR Allergies Info: Procaine, Procaine Hcl           Current Medications (07/03/2018):  This is the current hospital active medication list Current Facility-Administered Medications  Medication Dose  Route Frequency Provider Last Rate Last Dose  . acetaminophen (TYLENOL) tablet 650 mg  650 mg Oral Q6H PRN Emokpae, Ejiroghene E, MD       Or  . acetaminophen (TYLENOL) suppository 650 mg  650 mg Rectal Q6H PRN Emokpae, Ejiroghene E, MD      . apixaban (ELIQUIS) tablet 5 mg  5 mg Oral BID Emokpae, Ejiroghene E, MD   5 mg at 07/03/18 0919  . budesonide (PULMICORT) nebulizer solution 0.5 mg  0.5 mg Nebulization BID Tat, David, MD   0.5 mg at 07/03/18 0758  . dextromethorphan-guaiFENesin (MUCINEX DM) 30-600 MG per 12 hr tablet 1 tablet  1 tablet Oral BID Emokpae, Ejiroghene E, MD   1 tablet at 07/03/18 0919  . diltiazem (CARDIZEM CD) 24 hr capsule 240 mg  240 mg Oral Daily Tat, David, MD   240 mg at 07/03/18 0919  . furosemide (LASIX) injection 40 mg  40 mg Intravenous BID Orson Eva, MD   40 mg at 07/03/18 0925  . insulin aspart (novoLOG) injection 0-15 Units  0-15 Units Subcutaneous TID WC Orson Eva, MD   15 Units at 07/03/18 1224  . insulin aspart (novoLOG) injection 0-5 Units  0-5 Units Subcutaneous Benay Pike, MD   4 Units at 07/02/18 2110  . insulin glargine (LANTUS) injection 20 Units  20 Units Subcutaneous QHS Orson Eva, MD   20 Units at 07/02/18 1654  . ipratropium-albuterol (DUONEB) 0.5-2.5 (3) MG/3ML nebulizer solution 3 mL  3 mL Nebulization Q2H PRN Emokpae, Ejiroghene E, MD   3 mL at 07/03/18 0301  . ipratropium-albuterol (DUONEB) 0.5-2.5 (3) MG/3ML nebulizer solution 3 mL  3 mL Nebulization Q4H Orson Eva, MD   3 mL at 07/03/18 1214  . LORazepam (ATIVAN) tablet 0.5 mg  0.5 mg Sublingual Q6H PRN Tat, Shanon Brow, MD   0.5 mg at 07/03/18 0320  . LORazepam (ATIVAN) tablet 0.5 mg  0.5 mg Oral BID Dellinger, Marianne L, PA-C   0.5 mg at 07/03/18 0919  . methylPREDNISolone sodium succinate (SOLU-MEDROL) 125 mg/2 mL injection 60 mg  60 mg Intravenous Nicholes Calamity, MD   60 mg at 07/03/18 9562  . morphine 2 MG/ML injection 1-2 mg  1-2 mg Intravenous Q2H PRN Dellinger, Marianne L, PA-C   2 mg at  07/03/18 0306  . morphine CONCENTRATE 10 MG/0.5ML oral solution 5 mg  5 mg Oral Q3H PRN Dellinger, Marianne L, PA-C   5 mg at 07/02/18 1359  . ondansetron (ZOFRAN) tablet 4 mg  4 mg Oral Q6H PRN Emokpae, Ejiroghene E, MD       Or  . ondansetron (ZOFRAN) injection 4 mg  4 mg Intravenous Q6H PRN Emokpae, Ejiroghene E, MD      . polyethylene glycol (MIRALAX / GLYCOLAX) packet 17 g  17 g Oral Daily Dellinger, Marianne L, PA-C   17 g at 07/03/18 0920  . rosuvastatin (CRESTOR) tablet 20 mg  20 mg Oral q1800 Emokpae, Ejiroghene E, MD   20 mg at 07/02/18 1850     Discharge Medications: Please see discharge summary for a list of discharge medications.  Relevant Imaging Results:  Relevant Lab Results:   Additional Information SSN  [id number]   Curtiss Mahmood, Clydene Pugh, LCSW

## 2018-07-03 NOTE — Progress Notes (Addendum)
Pt c/o of sob "I'm going to die just like this." Neb treatment and morphine given.

## 2018-07-03 NOTE — Clinical Social Work Note (Signed)
Clinical Social Work Assessment  Patient Details  Name: Andrew [name] MRN: [id number] Date of Birth: 12-26-1940  Date of referral:  07/03/18               Reason for consult:  Facility Placement                Permission sought to share information with:    Permission granted to share information::     Name::        Agency::     Relationship::     Contact Information:  Daughter, Mrs. Andrew [name].  Housing/Transportation Living arrangements for the past 2 months:  [place] of Information:  Patient, Adult Children Patient Interpreter Needed:  None Criminal Activity/Legal Involvement Pertinent to Current Situation/Hospitalization:  No - Comment as needed Significant Relationships:  Adult Children Lives with:  Adult Children Do you feel safe going back to the place where you live?  Yes Need for family participation in patient care:  Yes (Comment)  Care giving concerns:  Patient goes to the [facility] on Monday and Friday. He has a CNA through the [place] three days per week for five hours three days per week.    Social Worker assessment / plan:  At  Baseline patient uses a wheelchair and can stand and ambulate with a walker.  Patient's daughter states that patient is difficult to provide care for, patient acts like he cannot care for himself and that patient is demanding. She states that she is "frustrated." [name] states that patient can return to her home at [facility] discharge and that she needs a break.   Employment status:    Insurance informationEducational psychologist PT Recommendations:    Information / Referral to community resources:  [place]  Patient/Family's Response to care:  Daughter and patient are both agreeable to short term rehab at [facility].   Patient/Family's Understanding of and Emotional Response to Diagnosis, Current Treatment, and Prognosis:  Patient's daughter understands her diagnosis, treatment and prognosis.   Emotional  Assessment Appearance:  Appears stated age Attitude/Demeanor/Rapport:    Affect (typically observed):  Accepting Orientation:  Oriented to Self, Oriented to Place, Oriented to  Time, Oriented to Situation Alcohol / Substance use:  Not Applicable Psych involvement (Current and /or in the community):  No (Comment)  Discharge Needs  Concerns to be addressed:  Discharge Planning Concerns Readmission within the last 30 days:  No Current discharge risk:  None Barriers to Discharge:  No Barriers Identified   Ihor Gully, LCSW 07/03/2018, 2:47 PM

## 2018-07-03 NOTE — Progress Notes (Signed)
Pt given cup of coffee. Resting in bed with call light within reach.

## 2018-07-03 NOTE — Progress Notes (Signed)
PROGRESS NOTE  Andrew [name] VPX:[id number] DOB: January 01, 1941 DOA: 07/01/2018 PCP: [facility]  Brief History:  77 year old male with a history of chronic respiratory failure on 2 L at home, tobacco abuse, proximal motion for ablation, diabetes mellitus type 2, hypertension, hyperlipidemia presenting with 1 day history of worsening cough and shortness of breath.  Unfortunately, the patient continues to smoke 1 pack/day.  This represents the patient's sixth hospital admission since March 2019.  He denies any fevers, chills, nausea, vomiting, diarrhea, abdominal pain.  He denies any worsening peripheral edema orthopnea type symptoms.  Upon presentation, BMP was essentially unremarkable.  WBC was 13.9.  Chest x-ray showed hyperinflation with chronic interstitial changes.  The patient was initially started on nonrebreather, but has been since weaned back to nasal cannula.  The patient was started on bronchodilators, IV lasix, and IV Solu-Medrol.  Assessment/Plan: Acute on chronic respiratory failure -Secondary to COPD exacerbation and CHF exacerbation -presently on 4L -Wean oxygen back to baseline 2 L -Pulmonary hygiene  Acute on chronic diastolic CHF -The patient appears clinically fluid overloaded -He has JVD on clinical exam -Continue IV furosemide -Daily weights--no accurate--not standing -Accurate I's and O's--incomplete -personally reviewed CXR-->increased interstitial markings  COPD exacerbation -Increased dose of Pulmicort -Continue duo nebs -Continue IV Solu-Medrol  Paroxysmal atrial fibrillation -Currently in sinus rhythm -Continue apixaban -Continue diltiazem CD  Diabetes mellitus type 2 uncontrolled hyperglycemia -Increase Lantus to 35 units -add novolog 4 units with meals -NovoLog sliding scale -10/28-Check hemoglobin A1c--8.1  Essential hypertension -Continue diltiazem CD  Tobacco abuse -Tobacco cessation discussed I have discussed  tobacco cessation with the patient.  I have counseled the patient regarding the negative impacts of continued tobacco use including but not limited to lung cancer, COPD, and cardiovascular disease.  I have discussed alternatives to tobacco and modalities that may help facilitate tobacco cessation including but not limited to biofeedback, hypnosis, and medications.  Total time spent with tobacco counseling was 4 minutes.  Goals of Care -palliative medicine consulted -now DNR -add morphine SL for refractory sob   Disposition Plan:   SNF in 2-3 days  Family Communication:daughter updated at bedside--Total time spent 35 minutes.  Greater than 50% spent face to face counseling and coordinating care.   Consultants: palliative medicine  Code Status:  FULL   DVT Prophylaxis:  apixaban   Procedures: As Listed in Progress Note Above  Antibiotics: None   Subjective: Pt breathing better, but still sob with minimal exertion.  Nonproductive cough not much better.  No f/c, cp, abd pain, n/v/d  Objective: Vitals:   07/03/18 0633 07/03/18 0758 07/03/18 1214 07/03/18 1422  BP: (!) 141/82   133/60  Pulse: 77   71  Resp:    (!) 22  Temp: 98.3 F (36.8 C)   98.3 F (36.8 C)  TempSrc: Oral     SpO2: 100% 100% 99%   Weight:        Intake/Output Summary (Last 24 hours) at 07/03/2018 1647 Last data filed at 07/02/2018 1700 Gross per 24 hour  Intake 240 ml  Output 400 ml  Net -160 ml   Weight change:  Exam:   General:  Pt is alert, follows commands appropriately, not in acute distress  HEENT: No icterus, No thrush, No neck mass, [place]/AT  Cardiovascular: RRR, S1/S2, no rubs, no gallops  Respiratory: bilateral rhonchi and wheezing  Abdomen: Soft/+BS, non tender, non distended, no guarding  Extremities: 1+LE edema, No  lymphangitis, No petechiae, No rashes, no synovitis   Data Reviewed: I have personally reviewed following labs and imaging studies Basic Metabolic  Panel: Recent Labs  Lab 07/01/18 1324 07/03/18 0505  NA 141 138  K 4.0 4.1  CL 96* 95*  CO2 36* 35*  GLUCOSE 115* 295*  BUN 13 27*  CREATININE 0.78 0.72  CALCIUM 9.3 9.1  MG  --  2.1   Liver Function Tests: No results for input(s): AST, ALT, ALKPHOS, BILITOT, PROT, ALBUMIN in the last 168 hours. No results for input(s): LIPASE, AMYLASE in the last 168 hours. No results for input(s): AMMONIA in the last 168 hours. Coagulation Profile: No results for input(s): INR, PROTIME in the last 168 hours. CBC: Recent Labs  Lab 07/01/18 1324  WBC 13.9*  HGB 10.0*  HCT 36.0*  MCV 73.3*  PLT 288   Cardiac Enzymes: Recent Labs  Lab 07/01/18 1324  TROPONINI <0.03   BNP: Invalid input(s): POCBNP CBG: Recent Labs  Lab 07/02/18 1625 07/02/18 2106 07/03/18 0808 07/03/18 1208 07/03/18 1644  GLUCAP 266* 314* 312* 353* 159*   HbA1C: Recent Labs    07/03/18 0505  HGBA1C 8.1*   Urine analysis:    Component Value Date/Time   COLORURINE AMBER (A) 05/05/2013 1621   APPEARANCEUR CLEAR 05/05/2013 1621   LABSPEC 1.025 05/05/2013 1621   PHURINE 5.5 05/05/2013 1621   GLUCOSEU NEGATIVE 05/05/2013 1621   HGBUR NEGATIVE 05/05/2013 1621   HGBUR negative 03/31/2009 1514   BILIRUBINUR SMALL (A) 05/05/2013 1621   KETONESUR NEGATIVE 05/05/2013 1621   PROTEINUR NEGATIVE 05/05/2013 1621   UROBILINOGEN 1.0 05/05/2013 1621   NITRITE NEGATIVE 05/05/2013 1621   LEUKOCYTESUR NEGATIVE 05/05/2013 1621   Sepsis Labs: @LABRCNTIP (procalcitonin:4,lacticidven:4) )No results found for this or any previous visit (from the past 240 hour(s)).   Scheduled Meds: . apixaban  5 mg Oral BID  . budesonide (PULMICORT) nebulizer solution  0.5 mg Nebulization BID  . dextromethorphan-guaiFENesin  1 tablet Oral BID  . diltiazem  240 mg Oral Daily  . furosemide  40 mg Intravenous BID  . insulin aspart  0-15 Units Subcutaneous TID WC  . insulin aspart  0-5 Units Subcutaneous QHS  . insulin aspart  4 Units  Subcutaneous TID WC  . insulin glargine  25 Units Subcutaneous QHS  . ipratropium-albuterol  3 mL Nebulization Q4H  . LORazepam  0.5 mg Oral BID  . methylPREDNISolone (SOLU-MEDROL) injection  60 mg Intravenous Q6H  . polyethylene glycol  17 g Oral Daily  . rosuvastatin  20 mg Oral q1800   Continuous Infusions:  Procedures/Studies: Dg Chest Portable 1 View  Result Date: 07/01/2018 CLINICAL DATA:  COPD. EXAM: PORTABLE CHEST 1 VIEW COMPARISON:  04/11/2018 FINDINGS: Stable borderline cardiomegaly with mild aortic atherosclerosis. Slight pulmonary hyperinflation is specially of the right lung. Interstitial edema without alveolar consolidation. Left basilar atelectasis and/or scarring is stable. No pleural effusion or pneumothorax. No acute osseous abnormality. IMPRESSION: Similar appearance of the chest with hyperinflated lungs, mild interstitial edema, borderline cardiomegaly and minimal aortic atherosclerosis. Electronically Signed   By: Ashley Royalty M.D.   On: 07/01/2018 14:10    Orson Eva, DO  [facility] Pager [phone]  If 7PM-7AM, please contact night-coverage [email or web address] Password [email or web address] 07/03/2018, 4:47 PM   LOS: 1 day

## 2018-07-03 NOTE — Clinical Social Work Note (Signed)
LCSW spoke with Jinny Blossom at [facility]. Jinny Blossom stated that patient's daughter had revoked patient's hospice services on yesterday. She stated that patient's daughter wants patient placed in SNF and that his daughter is no longer willing for patient to live with him nor provide care for him.   Getsemani Lindon, Clydene Pugh, LCSW

## 2018-07-03 NOTE — Progress Notes (Signed)
Pt sleeping. Call light within reach and bed alarm on.  Will continue to monitor.

## 2018-07-03 NOTE — Progress Notes (Addendum)
Inpatient Diabetes Program Recommendations  AACE/ADA: New Consensus Statement on Inpatient Glycemic Control (2019)  Target Ranges:  Prepandial:   less than 140 mg/dL      Peak postprandial:   less than 180 mg/dL (1-2 hours)      Critically ill patients:  140 - 180 mg/dL   Results for KAMSIYOCHUKWU, SPICKLER (MRN [id number]) as of 07/03/2018 09:57  Ref. Range 07/02/2018 07:38 07/02/2018 11:27 07/02/2018 16:25 07/02/2018 21:06 07/03/2018 08:08  Glucose-Capillary Latest Ref Range: 70 - 99 mg/dL 262 (H) 145 (H) 266 (H) 314 (H) 312 (H)    Review of Glycemic Control  Current orders for Inpatient glycemic control: Insulin - Basal: If steroids are continued, please consider increasing Lantus to 25 units QHS. Insulin - Meal Coverage: If steroids are continued, please consider ordering Novolog 4 units TID with meals for meal coverage if patient eats at least 50% of meals.   Thanks, Barnie Alderman, RN, MSN, CDE Diabetes Coordinator Inpatient Diabetes Program [phone] (Team Pager from 8am to 5pm)

## 2018-07-03 NOTE — Clinical Social Work Placement (Signed)
   CLINICAL SOCIAL WORK PLACEMENT  NOTE  Date:  07/03/2018  Patient Details  Name: Andrew [name] MRN: [id number] Date of Birth: 1941/07/25  Clinical Social Work is seeking post-discharge placement for this patient at the [place] level of care (*CSW will initial, date and re-position this form in  chart as items are completed):  Yes   Patient/family provided with [facility] Work Department's list of facilities offering this level of care within the geographic area requested by the patient (or if unable, by the patient's family).  Yes   Patient/family informed of their freedom to choose among providers that offer the needed level of care, that participate in Medicare, Medicaid or managed care program needed by the patient, have an available bed and are willing to accept the patient.  Yes   Patient/family informed of [facility]'s ownership interest in [facility] and [facility], as well as of the fact that they are under no obligation to receive care at these facilities.  PASRR submitted to EDS on       PASRR number received on       Existing PASRR number confirmed on 07/03/18     FL2 transmitted to all facilities in geographic area requested by pt/family on 07/03/18     FL2 transmitted to all facilities within larger geographic area on       Patient informed that his/her managed care company has contracts with or will negotiate with certain facilities, including the following:            Patient/family informed of bed offers received.  Patient chooses bed at       Physician recommends and patient chooses bed at      Patient to be transferred to   on  .  Patient to be transferred to facility by       Patient family notified on   of transfer.  Name of family member notified:        PHYSICIAN       Additional Comment:    _______________________________________________ Ihor Gully, LCSW 07/03/2018, 2:35 PM

## 2018-07-04 DIAGNOSIS — J441 Chronic obstructive pulmonary disease with (acute) exacerbation: Secondary | ICD-10-CM

## 2018-07-04 LAB — BASIC METABOLIC PANEL
Anion gap: 11 (ref 5–15)
BUN: 33 mg/dL — AB (ref 8–23)
CO2: 34 mmol/L — ABNORMAL HIGH (ref 22–32)
CREATININE: 0.66 mg/dL (ref 0.61–1.24)
Calcium: 9.2 mg/dL (ref 8.9–10.3)
Chloride: 95 mmol/L — ABNORMAL LOW (ref 98–111)
GFR calc Af Amer: >60 mL/min (ref >60)
Glucose, Bld: 240 mg/dL — ABNORMAL HIGH (ref 70–99)
Potassium: 3.7 mmol/L (ref 3.5–5.1)
SODIUM: 140 mmol/L (ref 135–145)

## 2018-07-04 LAB — GLUCOSE, CAPILLARY
GLUCOSE-CAPILLARY: 320 mg/dL — AB (ref 70–99)
Glucose-Capillary: 225 mg/dL — ABNORMAL HIGH (ref 70–99)
Glucose-Capillary: 287 mg/dL — ABNORMAL HIGH (ref 70–99)
Glucose-Capillary: 290 mg/dL — ABNORMAL HIGH (ref 70–99)

## 2018-07-04 LAB — MAGNESIUM: MAGNESIUM: 2.2 mg/dL (ref 1.7–2.4)

## 2018-07-04 MED ORDER — METHYLPREDNISOLONE SODIUM SUCC 40 MG IJ SOLR
40.0000 mg | Freq: Three times a day (TID) | INTRAMUSCULAR | Status: DC
  Administered 2018-07-04 – 2018-07-05 (×2): 40 mg via INTRAVENOUS
  Filled 2018-07-04 (×2): qty 1

## 2018-07-04 NOTE — Plan of Care (Signed)
  Problem: Acute Rehab PT Goals(only PT should resolve) Goal: Pt Will Go Supine/Side To Sit Outcome: Progressing Flowsheets (Taken 07/04/2018 1050) Pt will go Supine/Side to Sit: Independently Goal: Patient Will Transfer Sit To/From Stand Outcome: Progressing Flowsheets (Taken 07/04/2018 1050) Patient will transfer sit to/from stand: with min guard assist Goal: Pt Will Transfer Bed To Chair/Chair To Bed Outcome: Progressing Flowsheets (Taken 07/04/2018 1050) Pt will Transfer Bed to Chair/Chair to Bed: min guard assist Goal: Pt Will Ambulate Outcome: Progressing Flowsheets (Taken 07/04/2018 1050) Pt will Ambulate: with min guard assist; 25 feet; with rolling walker   10:51 AM, 07/04/18 Lonell Grandchild, MPT Physical Therapist with [facility] [phone] office [phone] mobile phone

## 2018-07-04 NOTE — Care Management Important Message (Signed)
Important Message  Patient Details  Name: Andrew [name] MRN: [id number] Date of Birth: [birth year]   Medicare Important Message Given:  Yes    [name] 07/04/2018, 12:30 PM

## 2018-07-04 NOTE — Evaluation (Signed)
Clinical/Bedside Swallow Evaluation Patient Details  Name: Andrew [name] MRN: [id number] Date of Birth: 29-Jun-1941  Today's Date: 07/04/2018 Time: SLP Start Time (ACUTE ONLY): 1512 SLP Stop Time (ACUTE ONLY): 1527 SLP Time Calculation (min) (ACUTE ONLY): 15 min  Past Medical History:  Past Medical History:  Diagnosis Date  . A-fib ([place])   . Abdominal pain   . Adenomatous colon polyp   . Arthritis   . Asthma   . CAD (coronary artery disease)   . Cancer ([place])    skin, nose  . Cataract    bilateral  . CHF (congestive heart failure) ([place])    patient reports  . Chronic LBP   . COPD (chronic obstructive pulmonary disease) ([place])   . Decreased libido   . Diverticulosis of colon   . Esophageal stricture   . Fatigue   . GERD (gastroesophageal reflux disease)   . History of small bowel obstruction   . Hyperlipidemia   . Hypertension   . Hypertrophy of prostate with urinary obstruction and other lower urinary tract symptoms (LUTS)   . Palpitations   . Peri-rectal abscess   . Personal history of renal calculi   . Prostatitis, acute   . Stroke [facility])    pt reports  . Tobacco abuse    Past Surgical History:  Past Surgical History:  Procedure Laterality Date  . CATARACT EXTRACTION  04/2010   bilateral  . HERNIA REPAIR    . LUMBAR LAMINECTOMY     with Harrington rods  . PENILE PROSTHESIS PLACEMENT    . TONSILLECTOMY     HPI:  Andrew [name] is a 77 y.o. 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 ?Sputum color as he swallows. Patient still smoked cigs 1 PPD. He is on chronic O2. Pt has history of oropharyngeal dysphagia and most recent MBS completed 06/2017 recommending D3/thin.   Assessment / Plan / Recommendation Clinical Impression  Pt was administered clinical swallowing evaluation while seated  upright in bed. Pt consumed thin liquids, regular textures and puree textures with no overt s/sx of aspiration. However, note labored breathing during PO and slow consumption rate to coordinate respiration and swallowing. Pt is at increased risk of aspiration secondary to compromised respiratory status and notable decreased coordination of swallowing and respiration. Reinforce recommendations from most recent MBS 06/2017: "D3/mech soft (encourage moist textures) and thin liquids and no straws. Pt should clear throat/cough periodically to ensure clearance of liquids from laryngeal vestibule and repeat/dry swallow." Furthermore recommend slow rate and Pt to take breaks throughout meal to breathe in between bites/sips.  Recommend ST f/u for diet tolerance and to ensure implementation and compliance of strategies X1. SLP Visit Diagnosis: Dysphagia, oropharyngeal phase (R13.12)    Aspiration Risk  Mild aspiration risk;Moderate aspiration risk    Diet Recommendation Dysphagia 3 (Mech soft);Thin liquid   Liquid Administration via: Cup;Straw Medication Administration: Whole meds with liquid Supervision: Patient able to self feed Compensations: Minimize environmental distractions;Slow rate;Small sips/bites;Clear throat intermittently;Follow solids with liquid Postural Changes: Seated upright at 90 degrees;Remain upright for at least 30 minutes after po intake    Other  Recommendations Oral Care Recommendations: Oral care BID   Follow up Recommendations 24 hour supervision/assistance;Skilled Nursing facility      Frequency and Duration min 1 x/week  1 week  Prognosis Prognosis for Safe Diet Advancement: Fair      Swallow Study   General Date of Onset: 07/01/18 HPI: Andrew [name] is a 77 y.o. 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 ?Sputum color as he swallows. Patient still smoked cigs 1 PPD. He is on chronic O2. Pt has history of oropharyngeal dysphagia and most recent MBS completed 06/2017 recommending D3/thin. Type of Study: Bedside Swallow Evaluation Previous Swallow Assessment: MBS 06/2017, BSE 12/2017 Diet Prior to this Study: Thin liquids;Regular Temperature Spikes Noted: No Respiratory Status: Nasal cannula History of Recent Intubation: No Behavior/Cognition: Alert;Cooperative Oral Cavity Assessment: Dry Vision: Functional for self-feeding Self-Feeding Abilities: Able to feed self;Needs set up Patient Positioning: Upright in bed Baseline Vocal Quality: Normal Volitional Cough: Strong Volitional Swallow: Able to elicit    Oral/Motor/Sensory Function Overall Oral Motor/Sensory Function: Within functional limits   Ice Chips Ice chips: Within functional limits   Thin Liquid Thin Liquid: Within functional limits    Nectar Thick Nectar Thick Liquid: Not tested   Honey Thick Honey Thick Liquid: Not tested   Puree Puree: Within functional limits   Solid    Andrew [name] H. Roddie Mc, CCC-SLP Speech Language Pathologist  Solid: Within functional limits      Wende Bushy 07/04/2018,3:55 PM

## 2018-07-04 NOTE — Evaluation (Signed)
Physical Therapy Evaluation Patient Details Name: Andrew [name] MRN: [id number] DOB: [birth year] Today's Date: 07/04/2018   History of Present Illness  Andrew [name] is a 77 y.o. 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 ?Sputum color as he swallows. Patient still smoked cigs 1 PPD. He is on chronic O2. Patient is also on Home hospice, he cannot remember when last they came to check on him, doesn't remember the name of the agency or have their number.     Clinical Impression  Patient limited for taking steps and out of bed mostly due to severe SOB with exertion resulting in increased fall risk for functional activities and gait.  Patient able to take a few steps to transfer to chair and tolerated staying up after therapy.  Patient will benefit from continued physical therapy in hospital and recommended venue below to increase strength, balance, endurance for safe ADLs and gait.    Follow Up Recommendations SNF    Equipment Recommendations  None recommended by PT    Recommendations for Other Services       Precautions / Restrictions Precautions Precautions: Fall Precaution Comments: Home O2 dependent Restrictions Weight Bearing Restrictions: No      Mobility  Bed Mobility Overal bed mobility: Modified Independent             General bed mobility comments: head of bed raised  Transfers Overall transfer level: Needs assistance Equipment used: 1 person hand held assist Transfers: Sit to/from Stand;Stand Pivot Transfers Sit to Stand: Min assist;Mod assist Stand pivot transfers: Min assist;Mod assist       General transfer comment: unsteady on feet, had to lean on nearby objects for support  Ambulation/Gait Ambulation/Gait assistance: Mod assist Gait Distance (Feet): 4 Feet Assistive device:  1 person hand held assist Gait Pattern/deviations: Decreased step length - right;Decreased step length - left;Decreased stride length Gait velocity: slow   General Gait Details: limited to 4-5 slow labored steps to transfer to chair, declined to attempt ambulation using RW due to c/o severe SOB with exertion  Stairs            Wheelchair Mobility    Modified Rankin (Stroke Patients Only)       Balance Overall balance assessment: Needs assistance Sitting-balance support: Feet supported;No upper extremity supported Sitting balance-Leahy Scale: Good     Standing balance support: No upper extremity supported;During functional activity Standing balance-Leahy Scale: Poor Standing balance comment: with hand held assist                             Pertinent Vitals/Pain Pain Assessment: No/denies pain    Home Living Family/patient expects to be discharged to:: Private residence Living Arrangements: Children Available Help at Discharge: Family;Other (Comment)(Patient states his daughter can't help anymore) Type of Home: House Home Access: Ramped entrance     Home Layout: Multi-level Home Equipment: Walker - 4 wheels;Wheelchair - Education officer, community - power;Shower seat;Hospital bed;Cane - single point      Prior Function Level of Independence: Needs assistance   Gait / Transfers Assistance Needed: short distanced household ambulator with Rollator, uses wheelchair for longer distances  ADL's / Homemaking Assistance Needed: has CNA's 5 hours/day x 3 days/week  Comments: Patient states his daughter is not able to take care of him anymore  Hand Dominance   Dominant Hand: Right    Extremity/Trunk Assessment   Upper Extremity Assessment Upper Extremity Assessment: Generalized weakness    Lower Extremity Assessment Lower Extremity Assessment: Generalized weakness    Cervical / Trunk Assessment Cervical / Trunk Assessment: Normal  Communication    Communication: No difficulties  Cognition Arousal/Alertness: Awake/alert Behavior During Therapy: WFL for tasks assessed/performed Overall Cognitive Status: Within Functional Limits for tasks assessed                                        General Comments      Exercises     Assessment/Plan    PT Assessment Patient needs continued PT services  PT Problem List Decreased strength;Decreased activity tolerance;Decreased balance;Decreased mobility;Cardiopulmonary status limiting activity       PT Treatment Interventions Gait training;Stair training;Functional mobility training;Therapeutic activities;Therapeutic exercise;Patient/family education    PT Goals (Current goals can be found in the Care Plan section)  Acute Rehab PT Goals Patient Stated Goal: Return home after rehab PT Goal Formulation: With patient Time For Goal Achievement: 07/18/18 Potential to Achieve Goals: Good    Frequency Min 3X/week   Barriers to discharge        Co-evaluation               AM-PAC PT "6 Clicks" Daily Activity  Outcome Measure Difficulty turning over in bed (including adjusting bedclothes, sheets and blankets)?: A Little Difficulty moving from lying on back to sitting on the side of the bed? : A Little Difficulty sitting down on and standing up from a chair with arms (e.g., wheelchair, bedside commode, etc,.)?: A Lot Help needed moving to and from a bed to chair (including a wheelchair)?: A Lot Help needed walking in hospital room?: A Lot Help needed climbing 3-5 steps with a railing? : Total 6 Click Score: 13    End of Session Equipment Utilized During Treatment: Oxygen Activity Tolerance: Patient limited by fatigue Patient left: in chair;with call bell/phone within reach Nurse Communication: Mobility status PT Visit Diagnosis: Unsteadiness on feet (R26.81);Other abnormalities of gait and mobility (R26.89);Muscle weakness (generalized) (M62.81)    Time:  5852-7782 PT Time Calculation (min) (ACUTE ONLY): 24 min   Charges:   PT Evaluation $PT Eval Moderate Complexity: 1 Mod PT Treatments $Therapeutic Activity: 23-37 mins        10:48 AM, 07/04/18 Lonell Grandchild, MPT Physical Therapist with [facility] [phone] office [phone] mobile phone

## 2018-07-04 NOTE — Progress Notes (Signed)
Inpatient Diabetes Program Recommendations  AACE/ADA: New Consensus Statement on Inpatient Glycemic Control (2019)  Target Ranges:  Prepandial:   less than 140 mg/dL      Peak postprandial:   less than 180 mg/dL (1-2 hours)      Critically ill patients:  140 - 180 mg/dL  Results for SHADOW, SCHEDLER (MRN [id number]) as of 07/04/2018 07:47  Ref. Range 07/04/2018 05:17  Glucose Latest Ref Range: 70 - 99 mg/dL 240 (H)   Results for DAMANY, EASTMAN (MRN [id number]) as of 07/04/2018 07:47  Ref. Range 07/03/2018 08:08 07/03/2018 12:08 07/03/2018 16:44 07/03/2018 21:17  Glucose-Capillary Latest Ref Range: 70 - 99 mg/dL 312 (H) 353 (H) 159 (H) 265 (H)  Results for FUTURE, YELDELL (MRN [id number]) as of 07/04/2018 07:47  Ref. Range 07/03/2018 05:05  Hemoglobin A1C Latest Ref Range: 4.8 - 5.6 % 8.1 (H)   Review of Glycemic Control  Current orders for Inpatient glycemic control: Lantus 25 units QHS, Novolog 4 units TID with meals for meal coverage, Novolog 0-15 units TID with meals, Novolog 0-5 units QHS; Solumedrol 60 mg Q6H  Inpatient Diabetes Program Recommendations: Insulin - Basal: If steroids are continued as ordered, please consider increasing Lantus to 28 units QHS. Insulin - Meal Coverage: If steroids are continued as ordered, please consider increasing meal coverage to Novolog 6 units TID with meals if patient eats at least 50% of meals.  Thanks, Barnie Alderman, RN, MSN, CDE Diabetes Coordinator Inpatient Diabetes Program [phone] (Team Pager from 8am to 5pm)

## 2018-07-04 NOTE — Progress Notes (Signed)
Patient Demographics:    Andrew [name], is a 77 y.o. male, DOB - 06-09-1941, CZY:[id number]  Admit date - 07/01/2018   Admitting Physician Ejiroghene Arlyce Dice, MD  Outpatient Primary MD for the patient is [facility]  LOS - 2   Chief Complaint  Patient presents with  . Shortness of Breath        Subjective:    [name] today has no fevers, no emesis,  No chest pain, cough and shortness of breath persist, generalized weakness and debility persist,   Assessment  & Plan :    Principal Problem:   COPD with exacerbation ([place]) Active Problems:   Diabetes mellitus ([place])   TOBACCO ABUSE   Essential hypertension   ATRIAL FIBRILLATION   Acute on chronic diastolic CHF (congestive heart failure) (HCC)   COPD exacerbation (HCC)   Acute on chronic respiratory failure with hypoxia and hypercapnia (HCC)   DNR (do not resuscitate)   Palliative care encounter   Brief History: 77 year old male with a history of chronic respiratory failure on 2 L at home, tobacco abuse, proximal motion for ablation, diabetes mellitus type 2, hypertension, hyperlipidemia presenting with 1 day history of worsening cough and shortness of breath. Unfortunately, the patient continues to smoke 1 pack/day. This represents the patient's sixth hospital admission since March 2019. He denies any fevers, chills, nausea, vomiting, diarrhea, abdominal pain. He denies any worsening peripheral edema orthopnea type symptoms. Upon presentation, BMP was essentially unremarkable. WBC was 13.9. Chest x-ray showed hyperinflation with chronic interstitial changes. The patient was initially started on nonrebreather, but has been since weaned back to nasal cannula. The patient was started on bronchodilators, IV lasix, and IV Solu-Medrol.  Assessment/Plan: Acute on chronic respiratory failure -Secondary to COPD exacerbation and CHF  exacerbation -presently on 4L -Wean oxygen back to baseline 2 L -Pulmonary hygiene  HfpEF/Acute on chronic diastolic CHF Continues to have hypoxia above baseline and dyspnea, clinically and radiologically patient with evidence of volume overload, continue IV Lasix 40 mg twice daily, continue to monitor weights and fluid input and output, records have not been accurate   COPD exacerbation Hypoxia still above baseline, intermittent wheezing noted, continue Pulmicort -Continue bronchodilators, decrease Solu-Medrol to 40 mg every 8 hours-  Paroxysmal atrial fibrillation -Currently in sinus rhythm -Continue apixaban for stroke prophylaxis -Continue diltiazem CD for rate control  Diabetes mellitus type 2 uncontrolled hyperglycemia -Anticipate worsening hyperglycemia due to steroids, continue Lantus  35 units -add novolog 4 units with meals -NovoLog sliding scale -10/28-Check hemoglobin A1c--8.1   Tobacco abuse--smoking cessation advised especially in the setting of home oxygen use   Goals of Care -palliative medicine consulted -now DNR -Continue morphine SL for refractory sob   Disposition Plan: SNF in 2-3days  Family Communication: No family at bedside.   Consultants:palliative medicine  Code Status: FULL   DVT Prophylaxis:apixaban  Disposition/Need for in-Hospital Stay- patient unable to be discharged at this time due to persistent dyspnea, worsening hypoxia, requiring further management and stabilization in the setting of acute COPD and acute CHF exacerbation    Lab Results  Component Value Date   PLT 288 07/01/2018    Inpatient Medications  Scheduled Meds: . apixaban  5 mg Oral BID  . budesonide (PULMICORT) nebulizer  solution  0.5 mg Nebulization BID  . dextromethorphan-guaiFENesin  1 tablet Oral BID  . diltiazem  240 mg Oral Daily  . furosemide  40 mg Intravenous BID  . insulin aspart  0-15 Units Subcutaneous TID WC  . insulin aspart   0-5 Units Subcutaneous QHS  . insulin aspart  4 Units Subcutaneous TID WC  . insulin glargine  25 Units Subcutaneous QHS  . ipratropium-albuterol  3 mL Nebulization Q4H  . LORazepam  0.5 mg Oral BID  . methylPREDNISolone (SOLU-MEDROL) injection  60 mg Intravenous Q6H  . polyethylene glycol  17 g Oral Daily  . rosuvastatin  20 mg Oral q1800   Continuous Infusions: PRN Meds:.acetaminophen **OR** acetaminophen, ipratropium-albuterol, LORazepam, morphine injection, morphine CONCENTRATE, ondansetron **OR** ondansetron (ZOFRAN) IV    Anti-infectives (From admission, onward)   Start     Dose/Rate Route Frequency Ordered Stop   07/01/18 2100  doxycycline (VIBRAMYCIN) 100 mg in sodium chloride 0.9 % 250 mL IVPB  Status:  Discontinued     100 mg 125 mL/hr over 120 Minutes Intravenous Every 12 hours 07/01/18 2048 07/02/18 0935        Objective:   Vitals:   07/04/18 0820 07/04/18 0859 07/04/18 1155 07/04/18 1437  BP:    119/62  Pulse:    70  Resp:      Temp:      TempSrc:      SpO2: 100% 98% 100% 99%  Weight:        Wt Readings from Last 3 Encounters:  07/04/18 72.6 kg  04/14/18 73.7 kg  03/08/18 71 kg     Intake/Output Summary (Last 24 hours) at 07/04/2018 1619 Last data filed at 07/04/2018 1000 Gross per 24 hour  Intake 240 ml  Output 1500 ml  Net -1260 ml     Physical Exam Patient is examined daily including today on 10/301/9 , exams remain the same as of yesterday except that has changed   Gen:- Awake Alert,  In no apparent distress  HEENT:- [place].AT, No sclera icterus Neck-Supple Neck, +veJVD,.  Lungs-diminished in bases with scattered rales and wheezes in the upper lung fields CV- S1, S2 normal, regular, extra 50 Abd-  +ve B.Sounds, Abd Soft, No tenderness,    Extremity/Skin:- trace  edema,   good pulses Psych-affect is appropriate, oriented x3 Neuro-no new focal deficits, no tremors   Data Review:   Micro Results No results found for this or any previous  visit (from the past 240 hour(s)).  Radiology Reports Dg Chest Portable 1 View  Result Date: 07/01/2018 CLINICAL DATA:  COPD. EXAM: PORTABLE CHEST 1 VIEW COMPARISON:  04/11/2018 FINDINGS: Stable borderline cardiomegaly with mild aortic atherosclerosis. Slight pulmonary hyperinflation is specially of the right lung. Interstitial edema without alveolar consolidation. Left basilar atelectasis and/or scarring is stable. No pleural effusion or pneumothorax. No acute osseous abnormality. IMPRESSION: Similar appearance of the chest with hyperinflated lungs, mild interstitial edema, borderline cardiomegaly and minimal aortic atherosclerosis. Electronically Signed   By: Ashley Royalty M.D.   On: 07/01/2018 14:10     CBC Recent Labs  Lab 07/01/18 1324  WBC 13.9*  HGB 10.0*  HCT 36.0*  PLT 288  MCV 73.3*  MCH 20.4*  MCHC 27.8*  RDW 20.2*    Chemistries  Recent Labs  Lab 07/01/18 1324 07/03/18 0505 07/04/18 0517  NA 141 138 140  K 4.0 4.1 3.7  CL 96* 95* 95*  CO2 36* 35* 34*  GLUCOSE 115* 295* 240*  BUN 13 27*  33*  CREATININE 0.78 0.72 0.66  CALCIUM 9.3 9.1 9.2  MG  --  2.1 2.2   ------------------------------------------------------------------------------------------------------------------ No results for input(s): CHOL, HDL, LDLCALC, TRIG, CHOLHDL, LDLDIRECT in the last 72 hours.  Lab Results  Component Value Date   HGBA1C 8.1 (H) 07/03/2018   ------------------------------------------------------------------------------------------------------------------ No results for input(s): TSH, T4TOTAL, T3FREE, THYROIDAB in the last 72 hours.  Invalid input(s): FREET3 ------------------------------------------------------------------------------------------------------------------ No results for input(s): VITAMINB12, FOLATE, FERRITIN, TIBC, IRON, RETICCTPCT in the last 72 hours.  Coagulation profile No results for input(s): INR, PROTIME in the last 168 hours.  No results for  input(s): DDIMER in the last 72 hours.  Cardiac Enzymes Recent Labs  Lab 07/01/18 1324  TROPONINI <0.03   ------------------------------------------------------------------------------------------------------------------    Component Value Date/Time   BNP 92.0 07/01/2018 [place] M.D on 07/04/2018 at 4:19 PM  Pager---[phone] Go to [email or web address] - password [email or web address] for contact info  [facility] - Office  [phone]

## 2018-07-05 DIAGNOSIS — I5033 Acute on chronic diastolic (congestive) heart failure: Secondary | ICD-10-CM

## 2018-07-05 DIAGNOSIS — Z515 Encounter for palliative care: Secondary | ICD-10-CM

## 2018-07-05 LAB — GLUCOSE, CAPILLARY
GLUCOSE-CAPILLARY: 190 mg/dL — AB (ref 70–99)
Glucose-Capillary: 274 mg/dL — ABNORMAL HIGH (ref 70–99)

## 2018-07-05 MED ORDER — METOCLOPRAMIDE HCL 5 MG PO TABS: 5.0000 mg | ORAL_TABLET | Freq: Three times a day (TID) | ORAL | 0 refills | Status: AC

## 2018-07-05 MED ORDER — APIXABAN 5 MG PO TABS: 5.0000 mg | ORAL_TABLET | Freq: Two times a day (BID) | ORAL | 0 refills | Status: AC

## 2018-07-05 MED ORDER — PREDNISONE 20 MG PO TABS: 40.0000 mg | ORAL_TABLET | Freq: Every day | ORAL | 0 refills | Status: AC

## 2018-07-05 MED ORDER — INSULIN ASPART 100 UNIT/ML ~~LOC~~ SOLN: 0.0000 [IU] | Freq: Three times a day (TID) | SUBCUTANEOUS | 11 refills | Status: AC

## 2018-07-05 MED ORDER — DILTIAZEM HCL ER BEADS 300 MG PO CP24: 300.0000 mg | ORAL_CAPSULE | Freq: Every day | ORAL | 2 refills | Status: AC

## 2018-07-05 MED ORDER — SAXAGLIPTIN HCL 5 MG PO TABS: 5.0000 mg | ORAL_TABLET | Freq: Every day | ORAL | 2 refills | Status: AC

## 2018-07-05 MED ORDER — POLYETHYLENE GLYCOL 3350 17 G PO PACK: 17.0000 g | PACK | Freq: Every day | ORAL | 0 refills | Status: AC

## 2018-07-05 MED ORDER — INSULIN GLARGINE 100 UNITS/ML SOLOSTAR PEN: 25.0000 [IU] | PEN_INJECTOR | Freq: Every day | SUBCUTANEOUS | 0 refills | Status: AC

## 2018-07-05 MED ORDER — FUROSEMIDE 20 MG PO TABS: 20.0000 mg | ORAL_TABLET | ORAL | Status: DC

## 2018-07-05 MED ORDER — MORPHINE SULFATE (CONCENTRATE) 10 MG /0.5 ML PO SOLN: 5.0000 mg | ORAL | 0 refills | Status: AC | PRN

## 2018-07-05 MED ORDER — METHYLPREDNISOLONE SODIUM SUCC 40 MG IJ SOLR: 40.0000 mg | Freq: Two times a day (BID) | INTRAMUSCULAR | Status: DC

## 2018-07-05 MED ORDER — ROSUVASTATIN CALCIUM 5 MG PO TABS: 5.0000 mg | ORAL_TABLET | Freq: Every day | ORAL | 1 refills | Status: AC

## 2018-07-05 MED ORDER — DOXYCYCLINE HYCLATE 100 MG PO TABS: 100.0000 mg | ORAL_TABLET | Freq: Two times a day (BID) | ORAL | 0 refills | Status: AC

## 2018-07-05 MED ORDER — INSULIN GLARGINE 100 UNIT/ML ~~LOC~~ SOLN
20.0000 [IU] | Freq: Once | SUBCUTANEOUS | Status: AC
  Administered 2018-07-05: 20 [IU] via SUBCUTANEOUS
  Filled 2018-07-05: qty 0.2

## 2018-07-05 MED ORDER — FUROSEMIDE 40 MG PO TABS: 40.0000 mg | ORAL_TABLET | Freq: Every day | ORAL | 1 refills | Status: AC

## 2018-07-05 NOTE — Progress Notes (Signed)
Called report to [facility] nurse [place]. Removed pt IV and waiting on EMS transport. Pt Andrew [name] with prescriptions and personal belongings.

## 2018-07-05 NOTE — Clinical Social Work Placement (Signed)
   CLINICAL SOCIAL WORK PLACEMENT  NOTE  Date:  07/05/2018  Patient Details  Name: [name] MRN: [id number] Date of Birth: 1940/11/03  Clinical Social Work is seeking post-discharge placement for this patient at the [place] level of care (*CSW will initial, date and re-position this form in  chart as items are completed):  Yes   Patient/family provided with [place] Work Department's list of facilities offering this level of care within the geographic area requested by the patient (or if unable, by the patient's family).  Yes   Patient/family informed of their freedom to choose among providers that offer the needed level of care, that participate in Medicare, Medicaid or managed care program needed by the patient, have an available bed and are willing to accept the patient.  Yes   Patient/family informed of [facility]'s ownership interest in [facility] and [facility], as well as of the fact that they are under no obligation to receive care at these facilities.  PASRR submitted to EDS on       PASRR number received on       Existing PASRR number confirmed on 07/03/18     FL2 transmitted to all facilities in geographic area requested by pt/family on 07/03/18     FL2 transmitted to all facilities within larger geographic area on       Patient informed that his/her managed care company has contracts with or will negotiate with certain facilities, including the following:        Yes   Patient/family informed of bed offers received.  Patient chooses bed at [facility]     Physician recommends and patient chooses bed at      Patient to be transferred to [facility] on 07/05/18.  Patient to be transferred to facility by [facility]     Patient family notified on 07/05/18 of transfer.  Name of family member notified:  Rip Harbour, dtr.     PHYSICIAN       Additional Comment:  Discharge clinicals sent. Facility aware. LCSW  signing off.   _______________________________________________ Ihor Gully, LCSW 07/05/2018, 3:01 PM

## 2018-07-05 NOTE — Progress Notes (Signed)
Daily Progress Note   Patient Name: Andrew [name]       Date: 07/05/2018 DOB: May 26, 1941  Age: 77 y.o. MRN#: [id number] Attending Physician: Roxan Hockey, MD Primary Care Physician: [facility] Admit Date: 07/01/2018  Reason for Consultation/Follow-up: Establishing goals of care and Psychosocial/spiritual support  Subjective: Spoke with patient, Dr. Denton Brick, [place], and the patient's daughter Rip Harbour.  The patient tells me he realizes his breathing is getting worse.  He is afraid of what happens next.  We talked about Hospice being a safety net.  He believes he is on Hospice.  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  Discussed the case with Ambrose Pancoast.  She has secured a place at rehab for Mr. Doutt.  We are both concerned he will return to the hospital within weeks because of his chronically poor respiratory status.    I called Melinda.  She told me the story again that his father will not do for himself at home at all.  That the burden of her father is harming her relationship with her husband. She described his manipulative behavior. I listened for approximately 30 min.  Rip Harbour wants for her father to be comfortable.  She does not want him to suffer.  She wanted him to go to Hospice rather than [facility] this admission.  I advised her to re-engage [place] as early as possible.  I explained that if he is truly on comfort measures he would be eligible for Hospice House.  When / If he returns to the hospital Rip Harbour wants him to receive Comfort Measures Only.     Assessment: Acute on Chronic hypoxic respiratory failure.  Now at his baseline which is gasping for breath at rest on 3L. Unwilling/unable to stand and  walk unassisted.   Periodically taking oral morphine for air hunger.   Patient Profile/HPI:  77 y.o. 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  This is his 6th admission to the hospital since March.   Length of Stay: 3  Current Medications: Scheduled Meds:  . apixaban  5 mg Oral BID  . budesonide (PULMICORT) nebulizer solution  0.5 mg Nebulization BID  . dextromethorphan-guaiFENesin  1 tablet Oral BID  . diltiazem  240 mg Oral Daily  . furosemide  40 mg Intravenous BID  . insulin aspart  0-15 Units Subcutaneous TID WC  . insulin aspart  0-5 Units Subcutaneous QHS  . insulin aspart  4 Units Subcutaneous TID WC  . insulin glargine  20 Units Subcutaneous Once  . insulin glargine  25 Units Subcutaneous QHS  . ipratropium-albuterol  3 mL Nebulization Q4H  . LORazepam  0.5 mg Oral BID  . methylPREDNISolone (SOLU-MEDROL) injection  40 mg Intravenous Q12H  . polyethylene glycol  17 g Oral Daily  . rosuvastatin  20 mg Oral q1800    Continuous Infusions:   PRN Meds: acetaminophen **OR** acetaminophen, ipratropium-albuterol, LORazepam, morphine injection, morphine CONCENTRATE, ondansetron **OR** ondansetron (ZOFRAN) IV  Physical Exam        Well developed male, working to breathe at rest.  Speaks in short stunted phrases.  Awake, alert.  Emotionally fragile, near tears.  Vital Signs: BP 120/60 (BP Location: Right Arm)   Pulse 70   Temp 98.2 F (36.8 C) (Oral)   Resp 19   Wt 73.8 kg   SpO2 100%   BMI 26.26 kg/m  SpO2: SpO2: 100 % O2 Device: O2 Device: Nasal Cannula O2 Flow Rate: O2 Flow Rate (L/min): 3 L/min  Intake/output summary:   Intake/Output Summary (Last 24 hours) at 07/05/2018 0930 Last data filed at 07/05/2018 0539 Gross per 24 hour  Intake -  Output 1600 ml  Net -1600 ml   LBM: Last BM Date: 07/02/18 Baseline Weight: Weight: 74 kg Most recent weight: Weight: 73.8  kg       Palliative Assessment/Data: 40%    Flowsheet Rows     Most Recent Value  Intake Tab  Referral Department  Hospitalist  Unit at Time of Referral  Med/Surg Unit  Palliative Care Primary Diagnosis  Pulmonary  Date Notified  07/02/18  Palliative Care Type  Return patient Palliative Care  Reason for referral  Clarify Goals of Care  Date of Admission  07/01/18  Date first seen by Palliative Care  07/02/18  # of days Palliative referral response time  0 Day(s)  # of days IP prior to Palliative referral  1  Clinical Assessment  Psychosocial & Spiritual Assessment  Palliative Care Outcomes      Patient Active Problem List   Diagnosis Date Noted  . COPD exacerbation ([place]) 07/02/2018  . Acute on chronic respiratory failure with hypoxia and hypercapnia ([place]) 07/02/2018  . DNR (do not resuscitate)   . Palliative care encounter   . Acute on chronic respiratory failure ([place]) 03/08/2018  . Hypokalemia 03/08/2018  . Hyponatremia 03/08/2018  . Acute respiratory failure with hypoxia ([place]) 03/08/2018  . GI bleed 02/03/2018  . Acute respiratory failure ([place]) 12/26/2017  . Aspiration pneumonia ([place]) 12/26/2017  . Dysphagia 12/26/2017  . Acute on chronic diastolic CHF (congestive heart failure) ([place]) 12/26/2017  . Acute on chronic respiratory failure with hypoxia ([place]) 11/17/2017  . COPD with exacerbation ([place]) 11/17/2017  . Hospital acquired PNA 05/14/2017  . HCAP (healthcare-associated pneumonia)   . Acute respiratory failure with hypoxia and hypercapnia ([place]) 04/12/2017  . Pneumonia 10/24/2016  . AAA (abdominal aortic aneurysm) ([place]) 01/04/2011  . Dizziness, nonspecific 12/26/2010  . UNSPECIFIED PERIPHERAL VASCULAR DISEASE 09/23/2010  . LEG CRAMPS 09/13/2010  . Diabetes mellitus ([place]) 02/11/2010  . CHRONIC OBSTRUCTIVE PULMONARY DISEASE, ACUTE EXACERBATION 02/11/2010  . OLECRANON BURSITIS, RIGHT 01/26/2010  . PENILE PAIN 12/29/2009  . ABNORMAL EJACULATION  05/05/2009  .  LARYNGITIS, ACUTE 04/21/2009  . ATRIAL FIBRILLATION 04/08/2009  . PALPITATIONS 04/08/2009  . FATIGUE 03/31/2009  . LIBIDO, DECREASED 03/31/2009  . HYPERTROPHY PROSTATE W/UR OBST & OTH LUTS 03/25/2009  . ACUTE PROSTATITIS 03/25/2009  . SMALL BOWEL OBSTRUCTION, HX OF 11/28/2007  . HLD (hyperlipidemia) 11/13/2007  . COPD 10/02/2007  . LOW BACK PAIN, CHRONIC 10/02/2007  . ADENOMATOUS COLONIC POLYP 05/22/2007  . ESOPHAGEAL STRICTURE 05/22/2007  . Diverticulosis of colon 05/22/2007  . TOBACCO ABUSE 05/08/2007  . Essential hypertension 05/08/2007  . Coronary atherosclerosis 05/08/2007  . ASTHMA 05/08/2007  . GERD 05/08/2007  . RENAL CALCULUS, HX OF 05/08/2007  . ABSCESS, PERIRECTAL, HX OF 05/08/2007  . ARTHRITIS, HX OF 05/08/2007  . PENILE PROSTHESIS 05/08/2007  . LAMINECTOMY, LUMBAR, HX OF 05/08/2007    Palliative Care Plan    Recommendations/Plan: D/C to SNF rehab with Palliative Care to Follow Would transition to [place] quickly if he is unable to rehab or unwilling to rehab. Continue SL morphine for air hunger Continue benzodiazepine for anxiety. Daughter wants comfort measures only.    If he returns to the hospital he should receive comfort measures only and be discharged to [facility]  Goals of Care and Additional Recommendations:  Limitations on Scope of Treatment: Full Comfort Care  Code Status:  DNR  Prognosis:  Weeks secondary to end stage COPD with continued tobacco abuse, now with risk of aspiration, malnutrition, deconditioning and limited mobility.  Discharge Planning:  [place] for rehab with Palliative care service follow-up  Care plan was discussed with [facility] MD, CSW, patient and daughter  Thank you for allowing the Palliative Medicine Team to assist in the care of this patient.  Total time spent:  70 min. Time in 8:45 Time out:  9:55     Greater than 50%  of this time was spent counseling and coordinating care related to the  above assessment and plan.  Florentina Jenny, PA-C Palliative Medicine  Please contact Palliative MedicineTeam phone at [phone] for questions and concerns between 7 am - 7 pm.   Please see AMION for individual provider pager numbers.

## 2018-07-05 NOTE — Discharge Instructions (Signed)
1)Use oxygen via nasal cannula at 2 L/min continuously at rest may increase up to 3 times per minute with activity 2)You are already taking apixaban/Eliquis SO Avoid ibuprofen/Advil/Aleve/Motrin/Goody Powders/Naproxen/BC powders/Meloxicam/Diclofenac/Indomethacin and other Nonsteroidal anti-inflammatory medications as these will make you more likely to bleed and can cause stomach ulcers, can also cause Kidney problems.  3)PLEASE GET palliative care and hospice consult at rehab facility 4)Please check blood sugars before each meal and at bedtime especially while patient is on prednisone/steroids.Marland KitchenMarland KitchenMarland Kitchen

## 2018-07-05 NOTE — Discharge Summary (Signed)
Andrew [name], is a 77 y.o. male  DOB 1941-04-26  MRN [id number].  Admission date:  07/01/2018  Admitting Physician  Bethena Roys, MD  Discharge Date:  07/05/2018   Primary MD  [facility], [facility]  Recommendations for primary care physician for things to follow:   1)Use oxygen via nasal cannula at 2 L/min continuously at rest may increase up to 3 times per minute with activity 2)You are already taking apixaban/Eliquis SO Avoid ibuprofen/Advil/Aleve/Motrin/Goody Powders/Naproxen/BC powders/Meloxicam/Diclofenac/Indomethacin and other Nonsteroidal anti-inflammatory medications as these will make you more likely to bleed and can cause stomach ulcers, can also cause Kidney problems.  3)PLEASE GET palliative care and hospice consult at rehab facility 4)Please check blood sugars before each meal and at bedtime especially while patient is on prednisone/steroids   Admission Diagnosis  COPD exacerbation ([place]) [J44.1]   Discharge Diagnosis  COPD exacerbation ([place]) [J44.1]    Principal Problem:   COPD with exacerbation ([place]) Active Problems:   Diabetes mellitus ([place])   TOBACCO ABUSE   Essential hypertension   ATRIAL FIBRILLATION   Acute on chronic diastolic CHF (congestive heart failure) (H[facility])   COPD exacerbation ([facility])   Acute on chronic respiratory failure with hypoxia and hypercapnia ([place])   DNR (do not resuscitate)   Palliative care encounter      Past Medical History:  Diagnosis Date  . A-fib ([place])   . Abdominal pain   . Adenomatous colon polyp   . Arthritis   . Asthma   . CAD (coronary artery disease)   . Cancer ([place])    skin, nose  . Cataract    bilateral  . CHF (congestive heart failure) ([place])    patient reports  . Chronic LBP   . COPD (chronic obstructive pulmonary disease) ([place])   . Decreased libido   . Diverticulosis of colon   . Esophageal stricture   . Fatigue   .  GERD (gastroesophageal reflux disease)   . History of small bowel obstruction   . Hyperlipidemia   . Hypertension   . Hypertrophy of prostate with urinary obstruction and other lower urinary tract symptoms (LUTS)   . Palpitations   . Peri-rectal abscess   . Personal history of renal calculi   . Prostatitis, acute   . Stroke [facility])    pt reports  . Tobacco abuse     Past Surgical History:  Procedure Laterality Date  . CATARACT EXTRACTION  04/2010   bilateral  . HERNIA REPAIR    . LUMBAR LAMINECTOMY     with Harrington rods  . PENILE PROSTHESIS PLACEMENT    . TONSILLECTOMY         HPI  from the history and physical done on the day of admission:    Patient coming from: Home  Chief Complaint: SOB,cough  HPI: Andrew [name] is a 77 y.o. male with medical history significant for End stage COPD on chronic O2, Current tobacco use, Asthma, Atria fib, DM2, AAA, who presented to the Ed with c/o increasing SOB and  worsening productive cough since yesterday evening. ?Sputum color as he swallows. Patient still smoked cigs 1 PPD. He is on chronic O2. Patient is also on Home hospice, he cannot remember when last they came to check on him, doesn't remember the name of the agency or have their number.  Patient denies chest pain or fever, reports stable and unchanged lower extremity swelling.  5 hopsital admissions since march this year- 4 of which were for COPD exacerbation.patient was given 125 mg Solu-Medrol by EMS.  He was placed on rebreather mask for shortness of breath but no documented hypoxia.   ED Course: Stable vitals.  Patient switched to nasal cannula but patient was gasping, so was placed on high flow nasal cannula.  WBC elevated 13.9 .  Stable chronic anemia- 10.  Chronic elevated bicarb 36.  EKG sinus rhythm, artifacts, normal intervals. Normal troponin. two-view chest x-ray hyperinflation.  He was given breathing treatments in Ed,  With persistent shortness of breath hospitalist  called to admit.     Hospital Course:    Brief History: 77 year old male with a history of chronic respiratory failure on 2 L at home, tobacco abuse, proximal motion for ablation, diabetes mellitus type 2, hypertension, hyperlipidemia presenting with 1 day history of worsening cough and shortness of breath. Unfortunately, the patient continues to smoke 1 pack/day. This represents the patient's sixth hospital admission since March 2019. He denies any fevers, chills, nausea, vomiting, diarrhea, abdominal pain. He denies any worsening peripheral edema orthopnea type symptoms. Upon presentation, BMP was essentially unremarkable. WBC was 13.9. Chest x-ray showed hyperinflation with chronic interstitial changes. The patient was initially started on nonrebreather, but has been since weaned back to nasal cannula. The patient was started on bronchodilators, IV lasix,and IV Solu-Medrol.  Assessment/Plan: Acute on chronic Respiratory Failure -Secondary to COPD exacerbation and CHF exacerbation --Improved with steroids and IV Lasix- Weaned oxygen back to baseline 2 L   HFpEF/Acute on chronic diastolic CHF Volume overload status improved significantly with IV Lasix, oxygen requirement has improved with diuresis,   COPD exacerbation --Improved with steroids and bronchodilators, oxygen requirement is back to baseline, okay to discharge on p.o. prednisone and doxycycline along with bronchodilators  Paroxysmal atrial fibrillation -Stable, currently in sinus rhythm -Continue apixaban for stroke prophylaxis -Continue diltiazemCD for rate control  Diabetes mellitus type 2 uncontrolled hyperglycemia - hyperglycemia worsened due to steroids, should improve now with tapering of steroids, subcu Lantus and sliding scale insulin as ordered, -07/02/18 hemoglobin A1c--8.1  Tobacco abuse--smoking cessation advised especially in the setting of home oxygen use  Goals of Care -palliative medicine  consult appreciated -now DNR -Continue morphine SL for refractory sob   Disposition Plan:SNF  Family Communication: No family at bedside.   Consultants:palliative medicine  Code Status: FULL   DVT Prophylaxis:apixaban  Discharge Condition: stable, overall prognosis is poor due to advanced COPD and hypoxic respiratory failure  Follow UP  Contact information for after-discharge care    [facility] .   Service:  Skilled Nursing Contact information: [address] [place] [address] [phone]              Diet and Activity recommendation:  As advised  Discharge Instructions    Discharge Instructions    Call MD for:  difficulty breathing, headache or visual disturbances   Complete by:  As directed    Call MD for:  persistant dizziness or light-headedness   Complete by:  As directed  Call MD for:  persistant nausea and vomiting   Complete by:  As directed    Call MD for:  temperature >100.4   Complete by:  As directed    Diet - low sodium heart healthy   Complete by:  As directed    Discharge instructions   Complete by:  As directed    1)Use oxygen via nasal cannula at 2 L/min continuously at rest may increase up to 3 times per minute with activity 2)You are already taking apixaban/Eliquis SO Avoid ibuprofen/Advil/Aleve/Motrin/Goody Powders/Naproxen/BC powders/Meloxicam/Diclofenac/Indomethacin and other Nonsteroidal anti-inflammatory medications as these will make you more likely to bleed and can cause stomach ulcers, can also cause Kidney problems.  3)PLEASE GET palliative care and hospice consult at rehab facility 4)Please check blood sugars before each meal and at bedtime especially while patient is on prednisone/steroids   Increase activity slowly   Complete by:  As directed         Discharge Medications     Allergies as of 07/05/2018      Reactions   Procaine Shortness Of Breath,  Other (See Comments)   Syncope.    Procaine Hcl Anaphylaxis, Swelling      Medication List    STOP taking these medications   aspirin EC 325 MG tablet   docusate sodium 100 MG capsule Commonly known as:  COLACE   glipiZIDE 2.5 MG 24 hr tablet Commonly known as:  GLUCOTROL XL   insulin aspart 100 UNIT/ML FlexPen Commonly known as:  NOVOLOG Replaced by:  insulin aspart 100 UNIT/ML injection     TAKE these medications   acetaminophen 650 MG CR tablet Commonly known as:  TYLENOL Take 650 mg by mouth every 8 (eight) hours as needed for pain.   albuterol (2.5 MG/3ML) 0.083% nebulizer solution Commonly known as:  PROVENTIL Take 3 mLs (2.5 mg total) by nebulization every 4 (four) hours as needed for wheezing or shortness of breath.   ANORO ELLIPTA 62.5-25 MCG/INH Aepb Generic drug:  umeclidinium-vilanterol Inhale 1 puff into the lungs daily.   apixaban 5 MG Tabs tablet Commonly known as:  ELIQUIS Take 1 tablet (5 mg total) by mouth 2 (two) times daily. What changed:  additional instructions   budesonide-formoterol 160-4.5 MCG/ACT inhaler Commonly known as:  SYMBICORT Inhale 2 puffs into the lungs 2 (two) times daily.   diltiazem 300 MG 24 hr capsule Commonly known as:  TIAZAC Take 1 capsule (300 mg total) by mouth daily.   doxycycline 100 MG tablet Commonly known as:  VIBRA-TABS Take 1 tablet (100 mg total) by mouth 2 (two) times daily for 7 days.   furosemide 40 MG tablet Commonly known as:  LASIX Take 1 tablet (40 mg total) by mouth daily. For daily What changed:  additional instructions   insulin aspart 100 UNIT/ML injection Commonly known as:  novoLOG Inject 0-15 Units into the skin 3 (three) times daily with meals. Correction coverage: Moderate (average weight, post-op)  CBG < 70: implement hypoglycemia protocol  CBG 70 - 120: 0 units  CBG 121 - 150: 2 units  CBG 151 - 200: 3 units  CBG 201 - 250: 5 units  CBG 251 - 300: 8 units  CBG 301 - 350: 11  units  CBG 351 - 400: 15 units  CBG > 400 call MD Replaces:  insulin aspart 100 UNIT/ML FlexPen   insulin glargine 100 unit/mL Sopn Commonly known as:  LANTUS Inject 0.25 mLs (25 Units total) into the skin daily.   ipratropium-albuterol  0.5-2.5 (3) MG/3ML Soln Commonly known as:  DUONEB Inhale 3 mLs into the lungs every 6 (six) hours as needed.   lactulose 10 GM/15ML solution Commonly known as:  CHRONULAC Take 30 mLs by mouth daily as needed.   Melatonin 3 MG Tabs Take 1 tablet by mouth at bedtime.   metoCLOPramide 5 MG tablet Commonly known as:  REGLAN Take 1 tablet (5 mg total) by mouth 4 (four) times daily -  before meals and at bedtime. What changed:    medication strength  how much to take  when to take this   montelukast 10 MG tablet Commonly known as:  SINGULAIR Take 10 mg by mouth at bedtime.   morphine CONCENTRATE 10 mg / 0.5 ml concentrated solution Take 0.25 mLs (5 mg total) by mouth every 3 (three) hours as needed for shortness of breath.   nicotine 21 mg/24hr patch Commonly known as:  NICODERM CQ - dosed in mg/24 hours Place 1 patch (21 mg total) onto the skin daily.   pantoprazole 40 MG tablet Commonly known as:  PROTONIX Take 1 tablet (40 mg total) by mouth daily.   polyethylene glycol packet Commonly known as:  MIRALAX / GLYCOLAX Take 17 g by mouth daily. Start taking on:  07/06/2018   potassium chloride SA 20 MEQ tablet Commonly known as:  K-DUR,KLOR-CON Take 1 tablet (20 mEq total) by mouth daily.   predniSONE 20 MG tablet Commonly known as:  DELTASONE Take 2 tablets (40 mg total) by mouth daily with breakfast for 5 days. What changed:    medication strength  how much to take  how to take this  when to take this  additional instructions   rosuvastatin 5 MG tablet Commonly known as:  CRESTOR Take 1 tablet (5 mg total) by mouth daily. What changed:    medication strength  how much to take   saxagliptin HCl 5 MG Tabs  tablet Commonly known as:  ONGLYZA Take 1 tablet (5 mg total) by mouth daily.   tiotropium 18 MCG inhalation capsule Commonly known as:  SPIRIVA Place 1 capsule (18 mcg total) into inhaler and inhale daily.   tiZANidine 4 MG tablet Commonly known as:  ZANAFLEX Take 4 mg by mouth 3 (three) times daily.   vitamin B-12 500 MCG tablet Commonly known as:  CYANOCOBALAMIN Take 1,000 mcg by mouth daily.       Major procedures and Radiology Reports - PLEASE review detailed and final reports for all details, in brief -   Dg Chest Portable 1 View  Result Date: 07/01/2018 CLINICAL DATA:  COPD. EXAM: PORTABLE CHEST 1 VIEW COMPARISON:  04/11/2018 FINDINGS: Stable borderline cardiomegaly with mild aortic atherosclerosis. Slight pulmonary hyperinflation is specially of the right lung. Interstitial edema without alveolar consolidation. Left basilar atelectasis and/or scarring is stable. No pleural effusion or pneumothorax. No acute osseous abnormality. IMPRESSION: Similar appearance of the chest with hyperinflated lungs, mild interstitial edema, borderline cardiomegaly and minimal aortic atherosclerosis. Electronically Signed   By: Ashley Royalty M.D.   On: 07/01/2018 14:10    Micro Results   No results found for this or any previous visit (from the past 240 hour(s)).     Today   Subjective    Ronel Rodeheaver today has no new concerns, appetite is fair, no vomiting or diarrhea no chest pains no palpitation oxygen requirement is improved no fevers          Patient has been seen and examined prior to discharge   Objective  Blood pressure 120/60, pulse 70, temperature 98.2 F (36.8 C), temperature source Oral, resp. rate 19, weight 73.8 kg, SpO2 97 %.   Intake/Output Summary (Last 24 hours) at 07/05/2018 1451 Last data filed at 07/05/2018 0539 Gross per 24 hour  Intake -  Output 900 ml  Net -900 ml    Exam Patient is examined daily including today on 07/05/18 , exams remain the same as  of yesterday except that has changed   Gen:- Awake Alert,  In no apparent distress  HEENT:- [place].AT, No sclera icterus Neck-Supple Neck, +veJVD,.  Nose- [place] 2 L/min Lungs-improving air movement, no significant wheezing  CV- S1, S2 normal, regular,  Abd-  +ve B.Sounds, Abd Soft, No tenderness,    Extremity/Skin:- trace  edema,   good pulses Psych-affect is appropriate, oriented x3 Neuro-no new focal deficits, no tremors   Data Review   CBC w Diff:  Lab Results  Component Value Date   WBC 13.9 (H) 07/01/2018   HGB 10.0 (L) 07/01/2018   HCT 36.0 (L) 07/01/2018   PLT 288 07/01/2018   LYMPHOPCT 3 03/10/2018   MONOPCT 6 03/10/2018   EOSPCT 0 03/10/2018   BASOPCT 0 03/10/2018    CMP:  Lab Results  Component Value Date   NA 140 07/04/2018   K 3.7 07/04/2018   CL 95 (L) 07/04/2018   CO2 34 (H) 07/04/2018   BUN 33 (H) 07/04/2018   CREATININE 0.66 07/04/2018   PROT 4.8 (L) 02/04/2018   ALBUMIN 2.6 (L) 02/04/2018   BILITOT 0.4 02/04/2018   ALKPHOS 44 02/04/2018   AST 14 (L) 02/04/2018   ALT 16 (L) 02/04/2018  .   Total Discharge time is about 33 minutes  Roxan Hockey M.D on 07/05/2018 at 2:51 PM  Pager---[phone]  Go to [email or web address] - password [email or web address] for contact info  [facility] - Office  [phone]

## 2018-07-05 NOTE — Progress Notes (Signed)
Inpatient Diabetes Program Recommendations  AACE/ADA: New Consensus Statement on Inpatient Glycemic Control (2015)  Target Ranges:  Prepandial:   less than 140 mg/dL      Peak postprandial:   less than 180 mg/dL (1-2 hours)      Critically ill patients:  140 - 180 mg/dL   Results for Andrew [name], Andrew [name] (MRN [id number]) as of 07/05/2018 10:30  Ref. Range 07/04/2018 07:48 07/04/2018 11:57 07/04/2018 17:22 07/04/2018 21:30 07/05/2018 07:53  Glucose-Capillary Latest Ref Range: 70 - 99 mg/dL 225 (H) 287 (H) 290 (H) 320 (H) 190 (H)   Review of Glycemic Control  Current orders for Inpatient glycemic control: Lantus 25 units QHS, Lantus 20 units x 1, Novolog 4 units TID with meals for meal coverage, Novolog 0-15 units TID with meals, Novolog 0-5 units QHS; Solumedrol 40 mg Q12H  Inpatient Diabetes Program Recommendations: Insulin - Basal: Noted Lantus 20 units x 1 was added this morning. Noted steroids have been decreased. With fasting glucose of 190 mg/dl, would not recommend giving one time Lantus dose. Anticipate patient needs more meal coverage instead of more basal insulin. Insulin - Meal Coverage: Anticipate patient needs additional meal coverage please consider increasing meal coverage to Novolog 6 units TID with meals if patient eats at least 50% of meals.  Thanks, Barnie Alderman, RN, MSN, CDE Diabetes Coordinator Inpatient Diabetes Program [phone] (Team Pager from 8am to 5pm)

## 2018-07-05 NOTE — Progress Notes (Signed)
Physical Therapy Treatment Patient Details Name: Andrew [name] MRN: [id number] DOB: [birth year] Today's Date: 07/05/2018    History of Present Illness Andrew [name] is a 77 y.o. 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 ?Sputum color as he swallows. Patient still smoked cigs 1 PPD. He is on chronic O2. Patient is also on Home hospice, he cannot remember when last they came to check on him, doesn't remember the name of the agency or have their number.     PT Comments    Pt was seen for evaluation of current status with pt agreeing only to exercise due to fatigue and SOB.  He is slated to go to rehab and will still be appropriate given the amount of assistance needed for performance of LE ROM and strengthening.  He is especially concerned with the issues of LLE, but needs help to use RLE too.  Follow acutely for progressive mobility and encouragement to be OOB to the chair.  Pt is expecting to go directly home but has been scheduled for SNF.     Follow Up Recommendations  SNF     Equipment Recommendations  None recommended by PT    Recommendations for Other Services       Precautions / Restrictions Precautions Precautions: Fall Precaution Comments: Home O2 dependent Restrictions Weight Bearing Restrictions: No    Mobility  Bed Mobility               General bed mobility comments: pt did not move to assess this ability  Transfers                 General transfer comment: did not attempt due to refusal  Ambulation/Gait                 Stairs             Wheelchair Mobility    Modified Rankin (Stroke Patients Only)       Balance                                            Cognition Arousal/Alertness: Awake/alert Behavior During Therapy: WFL for  tasks assessed/performed Overall Cognitive Status: Within Functional Limits for tasks assessed                                        Exercises General Exercises - Lower Extremity Ankle Circles/Pumps: AAROM;Both;5 reps Quad Sets: AROM;Both;10 reps Gluteal Sets: AROM;Both;10 reps Heel Slides: AROM;AAROM;Both;10 reps Hip ABduction/ADduction: AROM;AAROM;Both;10 reps Straight Leg Raises: AAROM;AROM;Both;10 reps Hip Flexion/Marching: AAROM;AROM;Both;10 reps    General Comments        Pertinent Vitals/Pain Pain Assessment: No/denies pain    Home Living                      Prior Function            PT Goals (current goals can now be found in the care plan section) Acute Rehab PT Goals Patient Stated Goal: Return home after rehab Progress towards PT goals: Progressing toward goals    Frequency    Min 3X/week  PT Plan Current plan remains appropriate    Co-evaluation              AM-PAC PT "6 Clicks" Daily Activity  Outcome Measure  Difficulty turning over in bed (including adjusting bedclothes, sheets and blankets)?: A Little Difficulty moving from lying on back to sitting on the side of the bed? : A Little Difficulty sitting down on and standing up from a chair with arms (e.g., wheelchair, bedside commode, etc,.)?: Unable Help needed moving to and from a bed to chair (including a wheelchair)?: A Lot Help needed walking in hospital room?: A Lot Help needed climbing 3-5 steps with a railing? : A Lot 6 Click Score: 13    End of Session Equipment Utilized During Treatment: Oxygen Activity Tolerance: Patient limited by fatigue Patient left: in chair;with call bell/phone within reach Nurse Communication: Mobility status PT Visit Diagnosis: Unsteadiness on feet (R26.81);Other abnormalities of gait and mobility (R26.89);Muscle weakness (generalized) (M62.81)     Time: 0370-9643 PT Time Calculation (min) (ACUTE ONLY): 23  min  Charges:  $Therapeutic Exercise: 23-37 mins                    Ramond Dial 07/05/2018, 2:32 PM   2:40 PM, 07/05/18 Mee Hives, PT, MS Physical Therapist - [place] [phone] (Office)

## 2018-07-12 ENCOUNTER — Ambulatory Visit: Payer: Medicare PPO | Admitting: Family Medicine

## 2018-07-28 ENCOUNTER — Other Ambulatory Visit (HOSPITAL_COMMUNITY)
Admission: AD | Admit: 2018-07-28 | Discharge: 2018-07-28 | Disposition: A | Discharge status: Home / Self Care | Payer: Medicare PPO | Source: Hospice | Attending: Hematology and Oncology | Admitting: Hematology and Oncology

## 2018-07-28 DIAGNOSIS — J449 Chronic obstructive pulmonary disease, unspecified: Secondary | ICD-10-CM | POA: Diagnosis present

## 2018-07-28 DIAGNOSIS — R309 Painful micturition, unspecified: Secondary | ICD-10-CM | POA: Diagnosis present

## 2018-07-28 LAB — URINALYSIS, ROUTINE W REFLEX MICROSCOPIC
BILIRUBIN URINE: NEGATIVE
GLUCOSE, UA: NEGATIVE mg/dL
Hgb urine dipstick: NEGATIVE
Ketones, ur: NEGATIVE mg/dL
LEUKOCYTES UA: NEGATIVE
Nitrite: NEGATIVE
PROTEIN: NEGATIVE mg/dL
Specific Gravity, Urine: 1.015 (ref 1.005–1.030)
pH: 7 (ref 5.0–8.0)

## 2018-07-30 LAB — URINE CULTURE: CULTURE: NO GROWTH

## 2018-08-05 DEATH — deceased

## 2018-08-07 ENCOUNTER — Ambulatory Visit: Payer: Medicare PPO | Admitting: Family Medicine

## 2018-08-23 ENCOUNTER — Ambulatory Visit: Payer: Medicare PPO | Admitting: Family Medicine

## 2019-08-04 IMAGING — CR DG CHEST 1V PORT
1 series · 1 of 1 positions shown · non-contrast
Comparison: One-view chest x-ray 04/14/2017

CLINICAL DATA: Respiratory failure.  Endotracheal tube in place.

EXAM:
PORTABLE CHEST 1 VIEW

[portable]
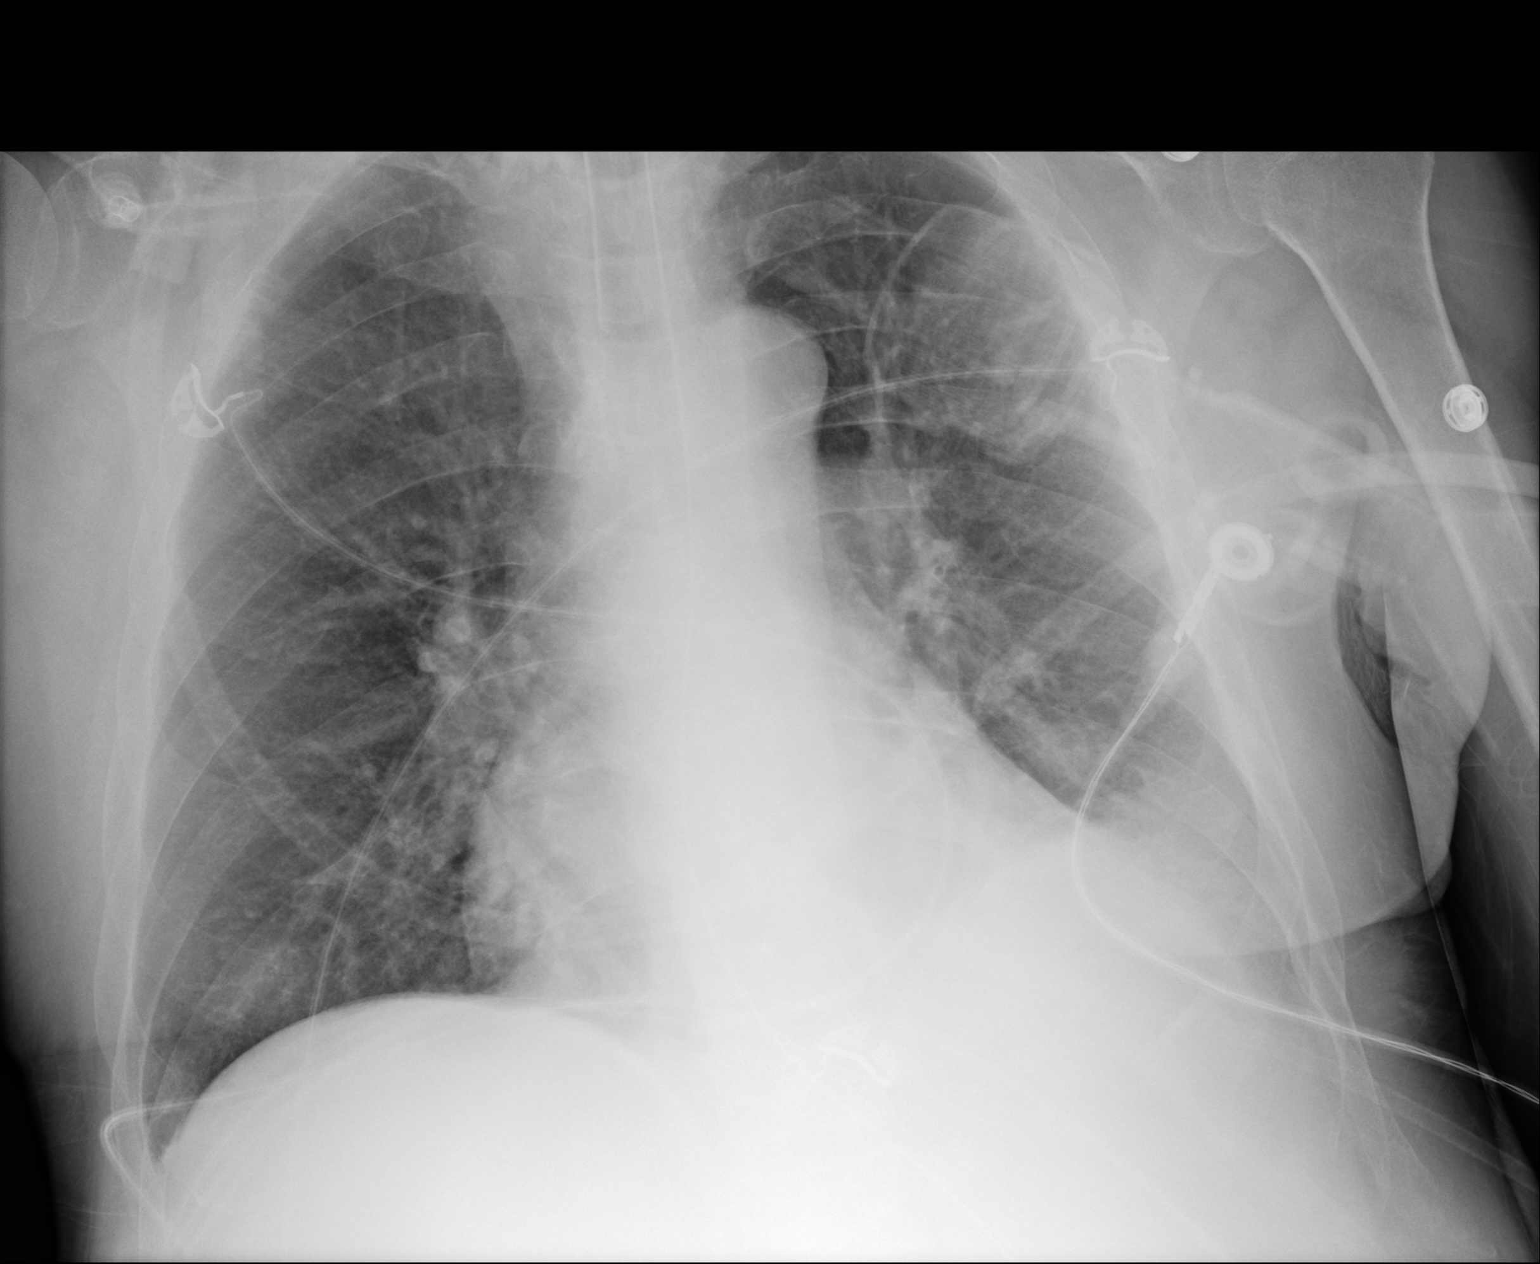

[1 of 1 positions shown; findings below may reference images not displayed]

FINDINGS: The endotracheal tube remains 5.5 cm above the carina, in
satisfactory position. The side port of the NG tube is just beyond
the GE junction. Mild pulmonary vascular congestion is stable. Left
greater than right basilar airspace disease remains with some
improved aeration. A left pleural effusion is suspected.
IMPRESSION: 1. Slight improved aeration with persistent left greater than right
basilar airspace disease, likely representing atelectasis.
2. The support apparatus is stable.

## 2019-08-07 IMAGING — CR DG CHEST 1V PORT
1 series · 1 of 1 positions shown · non-contrast
Comparison: Earlier the same date and 04/17/2017.

CLINICAL DATA: Endotracheal tube placement. Respiratory failure.
Unresponsive on admission.

EXAM:
PORTABLE CHEST 1 VIEW

[portable]
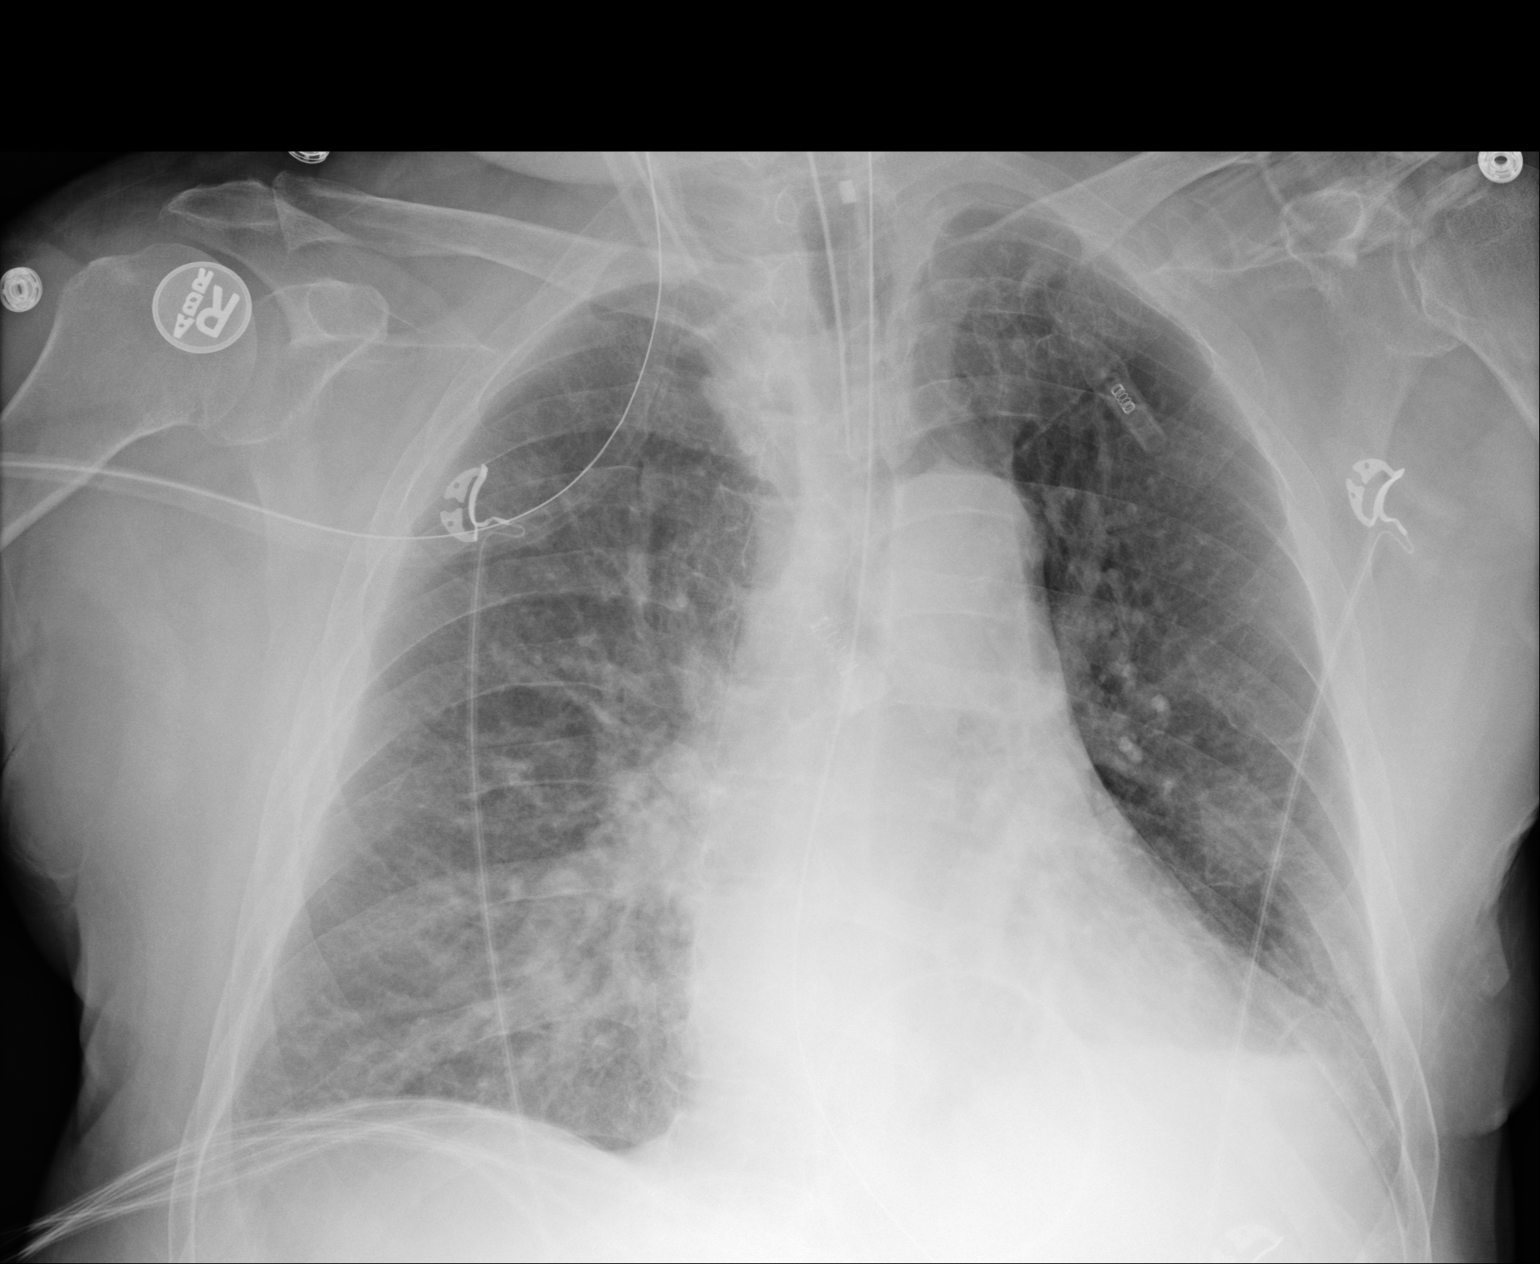

[1 of 1 positions shown; findings below may reference images not displayed]

FINDINGS: 9063 hour. The tip of the endotracheal tube is in the mid trachea.
Nasogastric tube projects below the diaphragm. A line loops over the
lower left chest, probably separate from the nasogastric tube,
although incompletely visualized. The heart size and mediastinal
contours are stable. There are stable bibasilar pulmonary opacities,
most consistent with atelectasis. No pneumothorax or significant
pleural effusion.
IMPRESSION: No significant changes are seen. Patchy bibasilar opacities are
stable, likely atelectasis. Endotracheal tube appears satisfactorily
positioned. Line projecting over the heart shadow is probably
separate from the adjacent nasogastric tube, although incompletely
visualized.

## 2019-08-08 IMAGING — CR DG CHEST 1V PORT
1 series · 2 of 2 positions shown · non-contrast
Comparison: 04/18/2017

CLINICAL DATA: Check for PICC line placement

EXAM:
PORTABLE CHEST 1 VIEW

[Series 1: portable · 0.17mm/px · 2 of 2 slices shown]
[im 1/2]
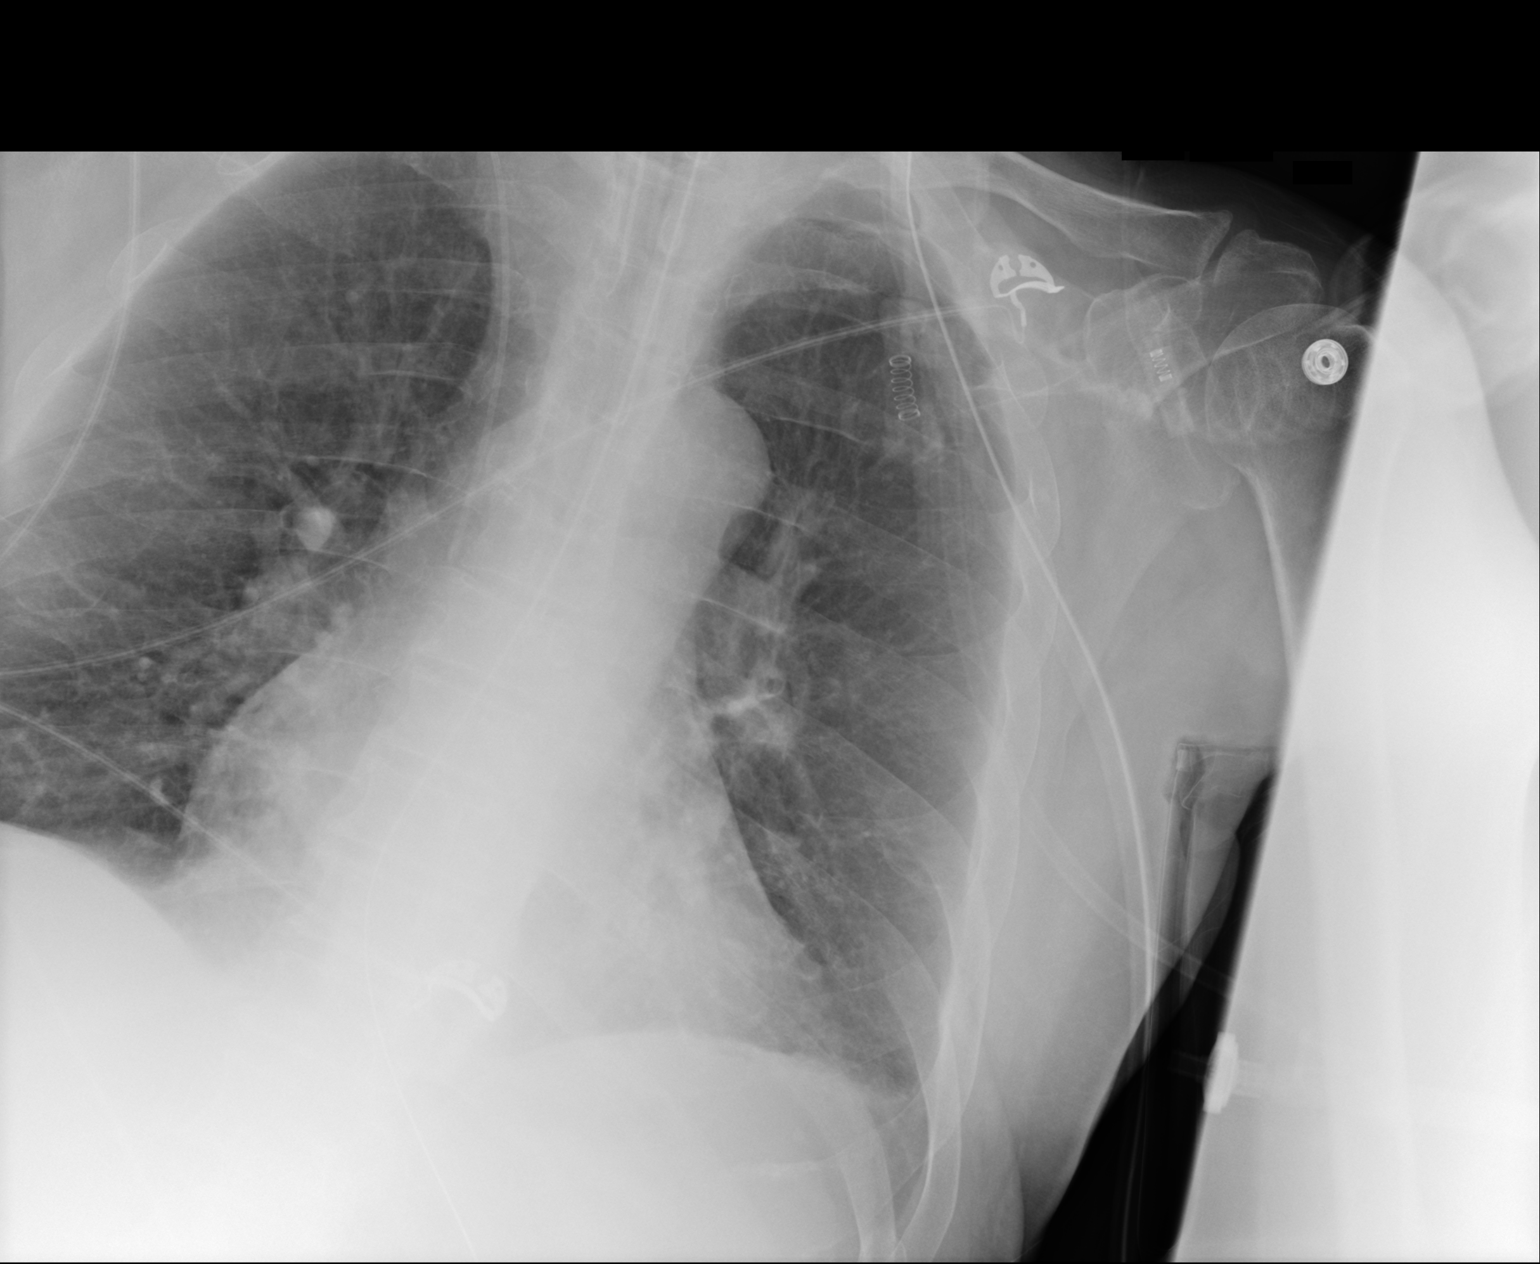
[im 2/2]
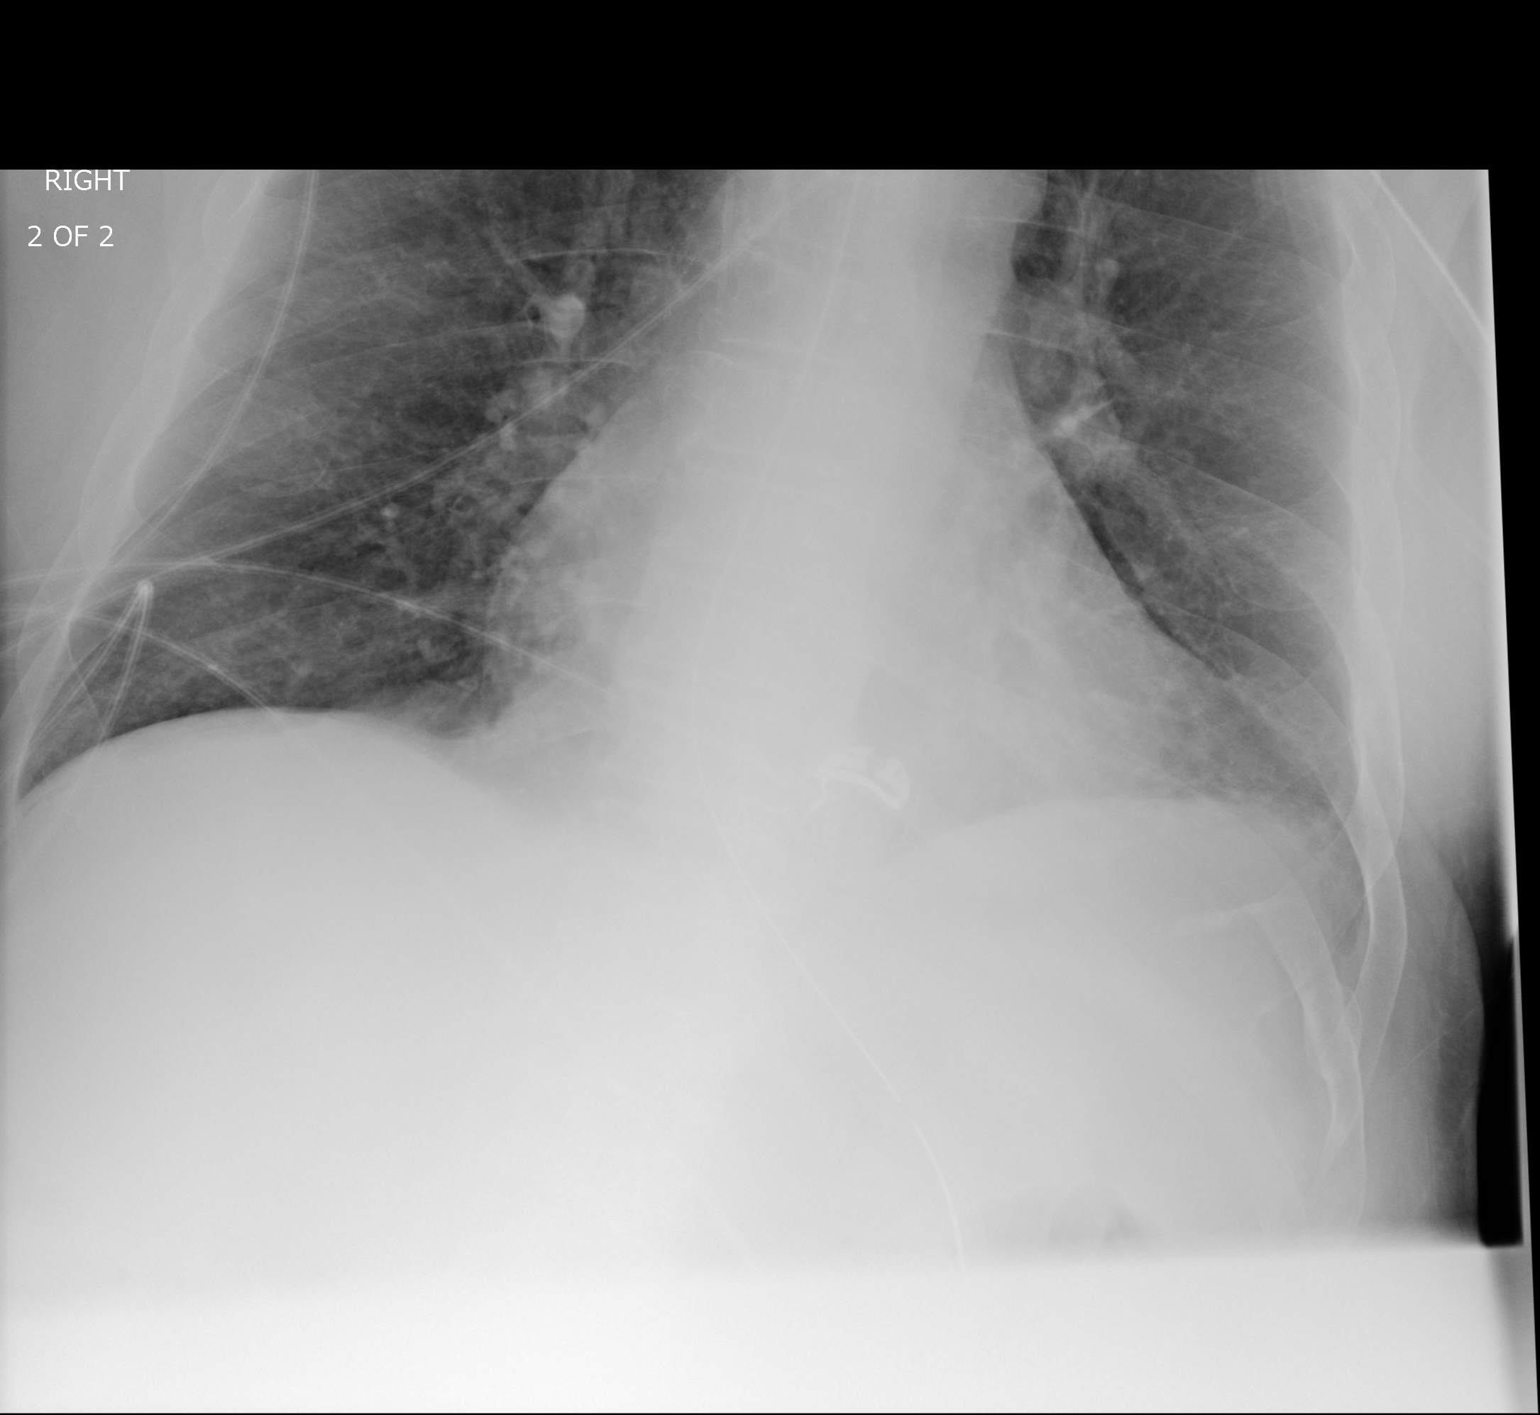

[2 of 2 positions shown; findings below may reference images not displayed]

FINDINGS: New right-sided PICC line is noted with catheter tip in the distal
superior vena cava. Endotracheal tube and nasogastric catheter are
noted in satisfactory position. Cardiac shadow is stable. The lungs
show some improved aeration in the bases. No new focal abnormality
is seen.
IMPRESSION: Status post PICC line placement in satisfactory position.

Improved aeration in the bases.

## 2019-08-09 IMAGING — CR DG CHEST 1V PORT
1 series · 1 of 1 positions shown · non-contrast
Comparison: Portable chest x-ray of 04/19/2017

CLINICAL DATA: Respiratory failure, intubation

EXAM:
PORTABLE CHEST 1 VIEW

[portable]
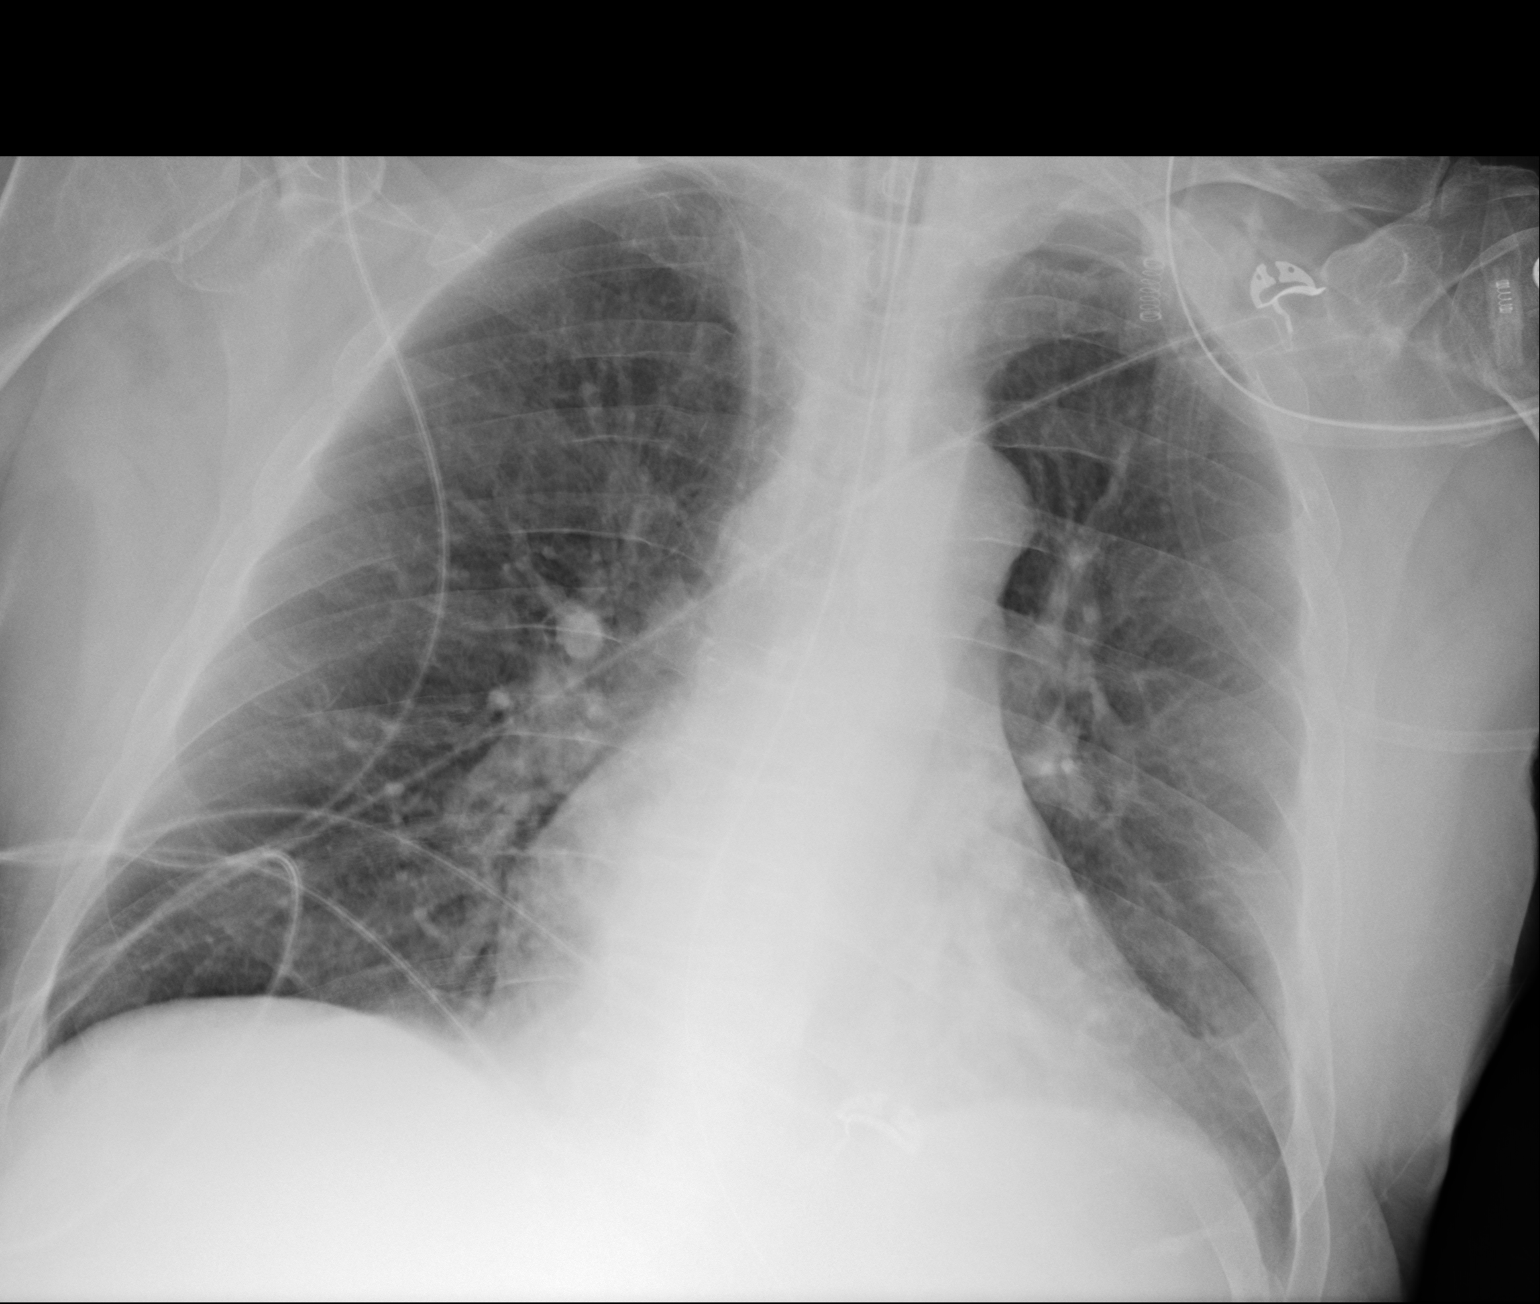

[1 of 1 positions shown; findings below may reference images not displayed]

FINDINGS: The tip of the endotracheal tube is approximately 8.6 cm above the
carina. Aeration of the lungs is relatively stable with minimal
linear atelectasis at the left lung base. No focal pneumonia or
effusion is seen. Right PICC line tip overlies the mid SVC. No
pneumothorax is seen. Heart size is stable being mildly enlarged.
IMPRESSION: 1. No change in aeration with mild linear atelectasis at the left
lung base.
2. Tip of endotracheal tube approximately 8.6 cm above the carina.
3. Right PICC line tip overlies the mid SVC

## 2019-08-10 IMAGING — CR DG CHEST 1V PORT
1 series · 1 of 1 positions shown · non-contrast
Comparison: 04/20/17

CLINICAL DATA: Respiratory failure

EXAM:
PORTABLE CHEST 1 VIEW

[portable]
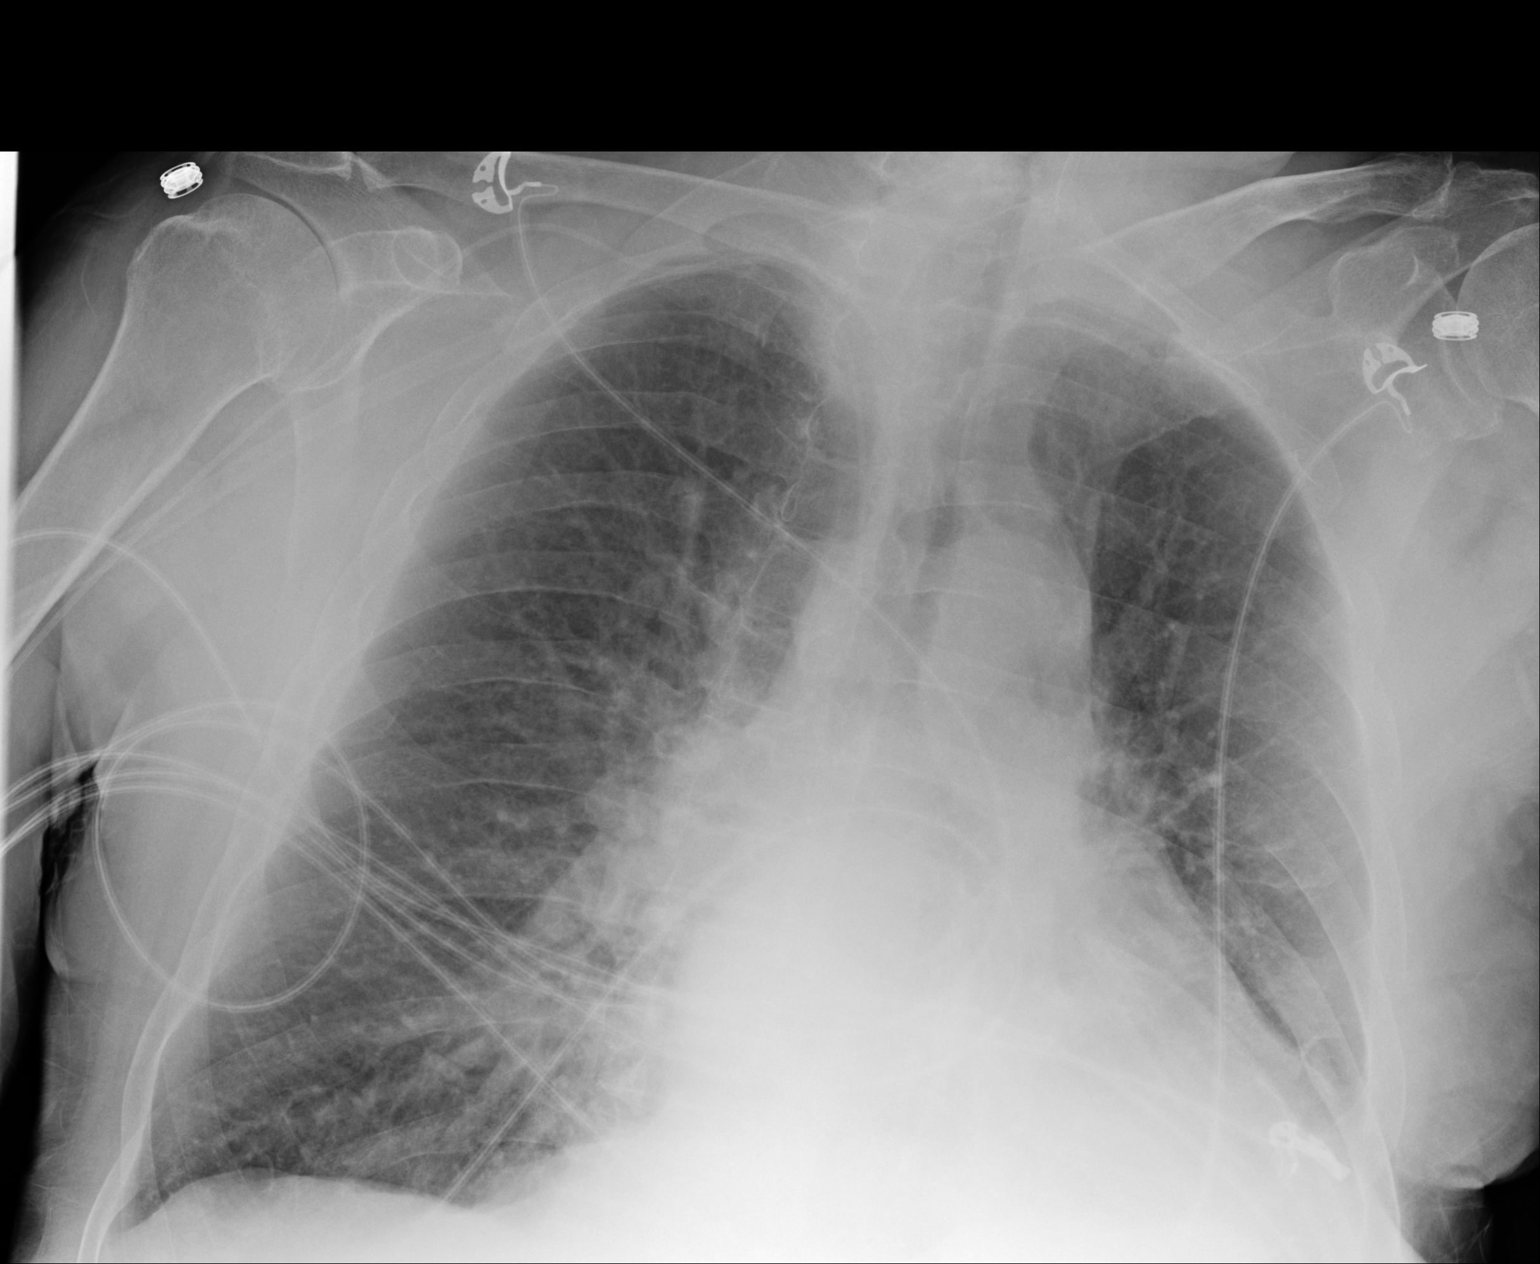

[1 of 1 positions shown; findings below may reference images not displayed]

FINDINGS: Cardiac shadow is mildly enlarged. Right-sided PICC line is noted in
the mid superior vena cava. The endotracheal tube and nasogastric
catheter have been removed in the interval. Mild left basilar
atelectasis is noted. Mild vascular congestion is seen.
IMPRESSION: The slight increase in left basilar atelectasis.

Mild vascular congestion.
